# Patient Record
Sex: Female | Born: 1935 | Race: White | Hispanic: No | Marital: Married | Smoking: Never smoker
Health system: Southern US, Community
[De-identification: ages and names within clinical notes are randomized; demographics above are authoritative.]

## PROBLEM LIST (undated history)

## (undated) DIAGNOSIS — K589 Irritable bowel syndrome without diarrhea: Secondary | ICD-10-CM

## (undated) DIAGNOSIS — R569 Unspecified convulsions: Secondary | ICD-10-CM

## (undated) DIAGNOSIS — I4891 Unspecified atrial fibrillation: Secondary | ICD-10-CM

## (undated) DIAGNOSIS — I10 Essential (primary) hypertension: Secondary | ICD-10-CM

## (undated) DIAGNOSIS — D649 Anemia, unspecified: Secondary | ICD-10-CM

## (undated) DIAGNOSIS — K222 Esophageal obstruction: Secondary | ICD-10-CM

## (undated) DIAGNOSIS — Z87440 Personal history of urinary (tract) infections: Secondary | ICD-10-CM

## (undated) DIAGNOSIS — I509 Heart failure, unspecified: Secondary | ICD-10-CM

## (undated) DIAGNOSIS — K5730 Diverticulosis of large intestine without perforation or abscess without bleeding: Secondary | ICD-10-CM

## (undated) DIAGNOSIS — G459 Transient cerebral ischemic attack, unspecified: Secondary | ICD-10-CM

## (undated) DIAGNOSIS — I499 Cardiac arrhythmia, unspecified: Secondary | ICD-10-CM

## (undated) DIAGNOSIS — G40909 Epilepsy, unspecified, not intractable, without status epilepticus: Secondary | ICD-10-CM

## (undated) DIAGNOSIS — F329 Major depressive disorder, single episode, unspecified: Secondary | ICD-10-CM

## (undated) DIAGNOSIS — F3289 Other specified depressive episodes: Secondary | ICD-10-CM

## (undated) DIAGNOSIS — I6350 Cerebral infarction due to unspecified occlusion or stenosis of unspecified cerebral artery: Secondary | ICD-10-CM

## (undated) DIAGNOSIS — K219 Gastro-esophageal reflux disease without esophagitis: Secondary | ICD-10-CM

## (undated) DIAGNOSIS — I639 Cerebral infarction, unspecified: Secondary | ICD-10-CM

## (undated) DIAGNOSIS — E785 Hyperlipidemia, unspecified: Secondary | ICD-10-CM

## (undated) HISTORY — DX: Gastro-esophageal reflux disease without esophagitis: K21.9

## (undated) HISTORY — DX: Personal history of urinary (tract) infections: Z87.440

## (undated) HISTORY — DX: Essential (primary) hypertension: I10

## (undated) HISTORY — DX: Transient cerebral ischemic attack, unspecified: G45.9

## (undated) HISTORY — DX: Cerebral infarction due to unspecified occlusion or stenosis of unspecified cerebral artery: I63.50

## (undated) HISTORY — DX: Epilepsy, unspecified, not intractable, without status epilepticus: G40.909

## (undated) HISTORY — PX: OTHER SURGICAL HISTORY: SHX169

## (undated) HISTORY — DX: Cardiac arrhythmia, unspecified: I49.9

## (undated) HISTORY — DX: Other specified depressive episodes: F32.89

## (undated) HISTORY — PX: HEMORRHOID SURGERY: SHX153

## (undated) HISTORY — DX: Esophageal obstruction: K22.2

## (undated) HISTORY — DX: Major depressive disorder, single episode, unspecified: F32.9

## (undated) HISTORY — DX: Diverticulosis of large intestine without perforation or abscess without bleeding: K57.30

## (undated) HISTORY — DX: Unspecified atrial fibrillation: I48.91

---

## 1999-11-03 ENCOUNTER — Encounter: Payer: Self-pay | Admitting: *Deleted

## 1999-11-03 ENCOUNTER — Encounter: Admission: RE | Admit: 1999-11-03 | Discharge: 1999-11-03 | Payer: Self-pay | Admitting: *Deleted

## 1999-11-14 ENCOUNTER — Encounter: Payer: Self-pay | Admitting: *Deleted

## 1999-11-14 ENCOUNTER — Encounter: Admission: RE | Admit: 1999-11-14 | Discharge: 1999-11-14 | Payer: Self-pay | Admitting: *Deleted

## 1999-12-28 ENCOUNTER — Encounter: Payer: Self-pay | Admitting: Gastroenterology

## 1999-12-28 DIAGNOSIS — K5730 Diverticulosis of large intestine without perforation or abscess without bleeding: Secondary | ICD-10-CM | POA: Insufficient documentation

## 2000-11-11 ENCOUNTER — Encounter: Payer: Self-pay | Admitting: *Deleted

## 2000-11-11 ENCOUNTER — Encounter: Admission: RE | Admit: 2000-11-11 | Discharge: 2000-11-11 | Payer: Self-pay | Admitting: *Deleted

## 2001-11-13 ENCOUNTER — Other Ambulatory Visit: Admission: RE | Admit: 2001-11-13 | Discharge: 2001-11-13 | Payer: Self-pay | Admitting: Obstetrics and Gynecology

## 2001-11-13 ENCOUNTER — Encounter: Payer: Self-pay | Admitting: *Deleted

## 2001-11-13 ENCOUNTER — Encounter: Admission: RE | Admit: 2001-11-13 | Discharge: 2001-11-13 | Payer: Self-pay | Admitting: *Deleted

## 2002-07-23 ENCOUNTER — Encounter: Admission: RE | Admit: 2002-07-23 | Discharge: 2002-07-23 | Payer: Self-pay | Admitting: Internal Medicine

## 2002-07-23 ENCOUNTER — Encounter: Payer: Self-pay | Admitting: Internal Medicine

## 2004-02-10 ENCOUNTER — Inpatient Hospital Stay (HOSPITAL_COMMUNITY): Admission: EM | Admit: 2004-02-10 | Discharge: 2004-02-14 | Payer: Self-pay | Admitting: Emergency Medicine

## 2004-02-10 ENCOUNTER — Encounter (INDEPENDENT_AMBULATORY_CARE_PROVIDER_SITE_OTHER): Payer: Self-pay | Admitting: Cardiology

## 2004-02-24 ENCOUNTER — Encounter: Admission: RE | Admit: 2004-02-24 | Discharge: 2004-04-24 | Payer: Self-pay | Admitting: Neurology

## 2004-04-26 ENCOUNTER — Inpatient Hospital Stay (HOSPITAL_COMMUNITY): Admission: AD | Admit: 2004-04-26 | Discharge: 2004-04-28 | Payer: Self-pay | Admitting: *Deleted

## 2004-05-08 ENCOUNTER — Ambulatory Visit (HOSPITAL_COMMUNITY): Admission: RE | Admit: 2004-05-08 | Discharge: 2004-05-08 | Payer: Self-pay | Admitting: *Deleted

## 2004-06-27 ENCOUNTER — Emergency Department (HOSPITAL_COMMUNITY): Admission: EM | Admit: 2004-06-27 | Discharge: 2004-06-27 | Payer: Self-pay | Admitting: Emergency Medicine

## 2004-07-25 ENCOUNTER — Ambulatory Visit: Payer: Self-pay | Admitting: Internal Medicine

## 2004-10-17 ENCOUNTER — Encounter: Admission: RE | Admit: 2004-10-17 | Discharge: 2004-11-01 | Payer: Self-pay | Admitting: Neurology

## 2004-11-03 ENCOUNTER — Encounter: Admission: RE | Admit: 2004-11-03 | Discharge: 2004-11-03 | Payer: Self-pay | Admitting: Internal Medicine

## 2005-10-10 ENCOUNTER — Ambulatory Visit (HOSPITAL_COMMUNITY): Admission: RE | Admit: 2005-10-10 | Discharge: 2005-10-10 | Payer: Self-pay | Admitting: Neurology

## 2005-12-21 ENCOUNTER — Encounter: Admission: RE | Admit: 2005-12-21 | Discharge: 2005-12-21 | Payer: Self-pay | Admitting: Internal Medicine

## 2006-08-29 ENCOUNTER — Other Ambulatory Visit: Admission: RE | Admit: 2006-08-29 | Discharge: 2006-08-29 | Payer: Self-pay | Admitting: Family Medicine

## 2007-03-11 ENCOUNTER — Encounter: Admission: RE | Admit: 2007-03-11 | Discharge: 2007-03-11 | Payer: Self-pay | Admitting: Neurology

## 2007-03-19 ENCOUNTER — Ambulatory Visit: Payer: Self-pay | Admitting: Gastroenterology

## 2007-03-26 ENCOUNTER — Ambulatory Visit: Payer: Self-pay | Admitting: Gastroenterology

## 2007-03-26 DIAGNOSIS — K222 Esophageal obstruction: Secondary | ICD-10-CM

## 2007-05-07 ENCOUNTER — Ambulatory Visit: Payer: Self-pay | Admitting: Gastroenterology

## 2007-10-23 ENCOUNTER — Encounter: Admission: RE | Admit: 2007-10-23 | Discharge: 2007-10-23 | Payer: Self-pay | Admitting: Family Medicine

## 2007-12-29 DIAGNOSIS — I4891 Unspecified atrial fibrillation: Secondary | ICD-10-CM

## 2007-12-29 DIAGNOSIS — I10 Essential (primary) hypertension: Secondary | ICD-10-CM

## 2007-12-29 DIAGNOSIS — Z8673 Personal history of transient ischemic attack (TIA), and cerebral infarction without residual deficits: Secondary | ICD-10-CM

## 2007-12-29 DIAGNOSIS — R110 Nausea: Secondary | ICD-10-CM

## 2007-12-29 DIAGNOSIS — F329 Major depressive disorder, single episode, unspecified: Secondary | ICD-10-CM

## 2007-12-29 DIAGNOSIS — G459 Transient cerebral ischemic attack, unspecified: Secondary | ICD-10-CM | POA: Insufficient documentation

## 2009-07-26 ENCOUNTER — Encounter: Payer: Self-pay | Admitting: Gastroenterology

## 2009-07-29 ENCOUNTER — Ambulatory Visit: Payer: Self-pay | Admitting: Gastroenterology

## 2009-08-12 ENCOUNTER — Ambulatory Visit: Payer: Self-pay | Admitting: Gastroenterology

## 2009-08-12 DIAGNOSIS — M729 Fibroblastic disorder, unspecified: Secondary | ICD-10-CM | POA: Insufficient documentation

## 2009-08-15 ENCOUNTER — Ambulatory Visit: Payer: Self-pay | Admitting: Gastroenterology

## 2009-08-15 ENCOUNTER — Telehealth: Payer: Self-pay | Admitting: Gastroenterology

## 2009-08-15 ENCOUNTER — Encounter (INDEPENDENT_AMBULATORY_CARE_PROVIDER_SITE_OTHER): Payer: Self-pay | Admitting: *Deleted

## 2009-08-19 ENCOUNTER — Telehealth (INDEPENDENT_AMBULATORY_CARE_PROVIDER_SITE_OTHER): Payer: Self-pay | Admitting: *Deleted

## 2009-08-23 ENCOUNTER — Telehealth: Payer: Self-pay | Admitting: Gastroenterology

## 2009-08-23 ENCOUNTER — Ambulatory Visit: Payer: Self-pay | Admitting: Gastroenterology

## 2009-08-23 ENCOUNTER — Ambulatory Visit (HOSPITAL_COMMUNITY): Admission: RE | Admit: 2009-08-23 | Discharge: 2009-08-23 | Payer: Self-pay | Admitting: Gastroenterology

## 2009-08-24 ENCOUNTER — Telehealth: Payer: Self-pay | Admitting: Gastroenterology

## 2009-09-19 ENCOUNTER — Ambulatory Visit: Payer: Self-pay | Admitting: Gastroenterology

## 2010-10-23 ENCOUNTER — Other Ambulatory Visit: Payer: Self-pay | Admitting: Family Medicine

## 2010-10-23 DIAGNOSIS — Z1239 Encounter for other screening for malignant neoplasm of breast: Secondary | ICD-10-CM

## 2010-10-26 ENCOUNTER — Ambulatory Visit
Admission: RE | Admit: 2010-10-26 | Discharge: 2010-10-26 | Disposition: A | Discharge status: Home / Self Care | Payer: Medicare Other | Source: Ambulatory Visit | Attending: Family Medicine | Admitting: Family Medicine

## 2010-10-26 DIAGNOSIS — Z1239 Encounter for other screening for malignant neoplasm of breast: Secondary | ICD-10-CM

## 2011-02-06 NOTE — Letter (Signed)
March 19, 2007    Pam Drown, M.D.  [address]  [place], [place] [address]   RE:  [name], [name]  MRN:  [id number]  /  DOB:  07/22/1936   Dear Dr. Corliss Blacker:   Upon your kind referral, I had the pleasure of evaluating your patient  and I am pleased to offer my findings.  I saw [name] in the office  today.  Enclosed is a copy of my progress note that details my findings  and recommendations.   Thank you for the opportunity to participate in your patient's care.    Sincerely,      Barbette Hair. Arlyce Dice, MD,FACG  Electronically Signed    RDK/MedQ  DD: 03/19/2007  DT: 03/19/2007  Job #: [id number]

## 2011-02-06 NOTE — Assessment & Plan Note (Signed)
[facility]                         GASTROENTEROLOGY OFFICE NOTE   [name], [name]                        MRN:          [id number]  DATE:05/07/2007                            DOB:          1936-04-21    PROBLEM:  Nausea and dysphagia.   [name] has returned to followup upper endoscopy and esophageal  dilatation.  A distal esophageal stricture was noted and dilated.  Since  that time, her GI symptoms have completely subsided.  She is without  nausea or dysphagia.  All together, she is feeling quite well.   Review of her previous records indicates that she did undergo a  colonoscopy in 2001 where diverticulosis of the left colon was seen.   EXAM:  Pulse 88, blood pressure 120/82, weight 184.   IMPRESSION:  1. Esophageal stricture - asymptomatic following dilatation therapy.  2. Nausea - secondary to #1.  3. History of diverticulosis.   RECOMMENDATIONS:  No further GI workup at this time.     Barbette Hair. Arlyce Dice, MD,FACG  Electronically Signed    RDK/MedQ  DD: 05/07/2007  DT: 05/08/2007  Job #: [id number]   cc:   Pam Drown, M.D.

## 2011-02-06 NOTE — Assessment & Plan Note (Signed)
[facility]                         GASTROENTEROLOGY OFFICE NOTE   ANTHONETTE, LESAGE                        MRN:          [id number]  DATE:03/19/2007                            DOB:          06-23-1936    PROBLEM:  Nausea.   Mrs. Peplinski is a pleasant, 75 year old white female, referred through the  courtesy of Dr. Corliss Blacker for evaluation.  For the last four to six weeks,  she has been complaining of nausea.  The nausea is primarily in the  morning.  It subsides with her mid-day meal.  She does not eat  breakfast.  Symptoms have not improved since starting both Prevacid and  adding omeprazole.  She did change her medicines several months ago to  now include Vytorin.  Previously, she had been taking a lipid-lowering  medicine only.  She denies pyrosis, per se.  She does have some  dysphagia, which, with further questioning, may be a problem with  initiating deglutition.  She denies abdominal pain.  She was last  colonoscoped in 1991.   PAST MEDICAL HISTORY:  Pertinent for hypertension and arrhythmias.  She  has had a CVA and a recent TIA.  She is on Coumadin.  She has a remote  history of a brain tumor.  She is status post hemorrhoidectomy.   FAMILY HISTORY:  Pertinent for a mother who had an MI.   MEDICATIONS INCLUDE:  1. Metoprolol.  2. Vytorin.  3. Coumadin.  4. Keppra.  5. Zoloft.  6. Omeprazole.  7. Prevacid.  8. Baby aspirin.   She is allergic to PENICILLIN.   She neither smokes nor drinks.  She is married and retired.   REVIEW OF SYSTEMS:  Was reviewed and is pertinent for a mild expressive  aphasia.   PHYSICAL EXAMINATION:  Pulse 72, blood pressure 152/94, weight 182.  HEENT: EOMI. PERRLA. Sclerae are anicteric.  Conjunctivae are pink.  NECK:  Supple without thyromegaly, adenopathy or carotid bruits.  CHEST:  Clear to auscultation and percussion without adventitious  sounds.  CARDIAC:  Regular rhythm; normal S1 S2.  There are  no murmurs, gallops  or rubs.  ABDOMEN:  Bowel sounds are normoactive.  Abdomen is soft, non-tender and  non-distended.  There are no abdominal masses, tenderness, splenic  enlargement or hepatomegaly.  EXTREMITIES:  Full range of motion.  No cyanosis, clubbing or edema.  RECTAL:  Deferred.   IMPRESSION:  1. Nausea.  This could be a manifestation of GERD, though she has not      responded to PPI therapy.  Gastroparesis and non-ulcer dyspepsia      are other considerations.  Medications could be causing this      symptom, though there has been no recent change.  2. Dysphagia.  I suspect this is due to neurogenic dysphagia, though a      distal esophageal stricture ought to be ruled out.  3. Arrhythmias - on Coumadin.   RECOMMENDATIONS:  1. Hold Prevacid and continue omeprazole q.h.s.  2. Upper endoscopy.  3. The patient was instructed to eat a small meal upon awakening.  4.  To consider gastric emptying scan, if above measures are      unsuccessful and endoscopy is negative.  5. To consider screening colonoscopy at some point in the future.     Barbette Hair. Arlyce Dice, MD,FACG  Electronically Signed    RDK/MedQ  DD: 03/19/2007  DT: 03/19/2007  Job #: [id number]   cc:   Pam Drown, M.D.

## 2011-02-09 NOTE — Discharge Summary (Signed)
NAME:  [name], [name]                           ACCOUNT NO.:  [id number]   MEDICAL RECORD NO.:  [id number]                   PATIENT TYPE:  INP   LOCATION:  3031                                 FACILITY:  [facility]   PHYSICIAN:  Pramod P. Pearlean Brownie, MD                 DATE OF BIRTH:  [birth year]   DATE OF ADMISSION:  02/10/2004  DATE OF DISCHARGE:  02/14/2004                                 DISCHARGE SUMMARY   DISCHARGE DIAGNOSES:  1. Left middle cerebral artery cardioembolic infarction.  2. Atrial fibrillation.  3. Hypertension.  4. Dyslipidemia.  5. History of brain tumor resection 15 years ago.   DISCHARGE MEDICATIONS:  1. Coumadin 5 mg every day.  2. Clonidine 0.1 mg b.i.d.  3. Zocor 40 mg every day.  4. Hydrochlorothiazide 25 mg every day.  5. Metoprolol 50 mg b.i.d.   STUDIES PERFORMED:  1. CT of the brain on admission shows no acute changes.  Chronic ischemic     changes.  Post surgical changes and encephalomalacia in the left frontal     lobe.  2. MRI of the brain shows acute moderate size left frontal infarct extending     from the left frontal operculum region to the left insula region.     Postoperative changes noted in the left frontal lobe.  Nonspecific white     matter changes secondary to small vessel disease throughout the brain.     Left ICA occlusion.  3. MRA of the brain shows left internal artery occluded with reconstitution     of the left carotid terminus via collaterals.  Atherosclerotic type     changes of branch vessels involving the left MCA as well as the left A1     segment of the left anterior cerebral artery.  4. Carotid Doppler.  Probable left ICA occlusion.  Has a thumping sound with     spiked wave forms off of diastolic flow and low velocities.  Right with     no significant ICA stenosis.  Vertebral arteries were antegrade     bilaterally.  5. Transcranial Doppler completed, results pending at time of discharge.  6. Cerebral angiogram performed  02/11/04 by Dr. Pecolia Ades shows     occluded left ICA proximally with no string  signs.  7. A 2-D echocardiogram shows an ejection fraction of 55-65% with no LV wall     motion abnormalities.  Left ventricular wall thickness moderately     increased.  8. EKG shows atrial fibrillation with rightward axis.   LABORATORY DATA:  CBC normal.  INR 3.1 day of discharge with PT of 24.2.  Differential normal.  Coagulation studies on admission normal.  Chemistries  normal except for glucose at 103.  Cardiac enzymes negative.  Cholesterol  looks good with cholesterol 166, triglycerides 92, HDL 56 and LDL 92.  Homocysteine 11.04.   HISTORY OF PRESENT ILLNESS:  Ms. Lillieann Pavlich is a 75 year old right-handed  white female who has a history of atrial fibrillation and resection of a  left frontoparietal brain tumor.  She was in her usual state of good health  today when suddenly around 8 p.m. she developed speech hesitancy with  inability to get her words out and confusion which was noted by her husband.  She also had right facial droop.  There was no other associated weakness.  EMS was called and the patient was brought to the emergency room, where her  symptoms have remained stable.  A coded stroke was reportedly called per ED  staff, but neurologist was not aware of situation until 10:20 p.m.  She was  not a TPA candidate secondary to time and minimal symptoms.  She was not a  __________ candidate secondary to no extremity weakness.  She was admitted  to the hospital for further stroke evaluation.   HOSPITAL COURSE:  MRI was positive for acute infarct.  There was some  discrepancy between the MRI and the carotid Doppler as far as ICA occlusion,  so an arteriogram was performed which did confirm left ICA occlusion with no  string signs.  Stroke was felt to be cardioembolic secondary to atrial  fibrillation which was present on her EKG.  After the arteriogram, she was  started on IV heparin and  Coumadin.  Once INR was therapeutic, the patient  was discharged home.   The patient was able to tolerate a regular diet, but did poorly with  language and cognition.  Will continue with home or outpatient speech  therapy at time of discharge.  She has no other therapy needs.   Other risk factors identified included dyslipidemia for which patient was on  Zocor and well controlled and hypertension which again is well controlled.  In the hospital, the patient did have some atrial fibrillation with rapid  response.  Dr. Katrinka Blazing was on call for Dr. Fraser Din and was asked to see her.  The patient was on beta blocker b.i.d. at home and was only in the hospital  on every day.  He recommended increasing to her regular dose for better  control of blood pressure and heart rate.  He agreed with other therapies as  at home.  She will follow up at their office as regularly scheduled.   CONDITION ON DISCHARGE:  Patient alert and oriented x3.  No __________.  Nonfluent speech.  Word hesitancy with inability to name.  She has good  repetition and comprehension.  She has no focal extremity weakness.  Her S1  and S2 are present.  Lungs are clear.   DISCHARGE PLAN:  1. Discharge home with spouse.  2. Outpatient speech therapy.  3. Coumadin for secondary stroke prevention.  Will follow up PT/INR level on     Wednesday of this week at Dr.     Jacqualine Code office.  4. Follow up with Dr. Marny Lowenstein on Wednesday also.  5. Follow up with Dr. Delia Heady in two months.  Need to call office for     appointment.      Annie Main, N.P.                         Pramod P. Pearlean Brownie, MD    SB/MEDQ  D:  02/14/2004  T:  02/15/2004  Job:  [id number]   cc:   Sharlet Salina, M.D.  [address]  [place]  [place]  [address]  Fax: [phone]   Meade Maw, M.D.  [address]  [place]  [place] [address]  Fax: [phone]

## 2011-02-09 NOTE — Op Note (Signed)
NAME:  [name], [name]                           ACCOUNT NO.:  [id number]   MEDICAL RECORD NO.:  [id number]                   PATIENT TYPE:  OIB   LOCATION:  2899                                 FACILITY:  [facility]   PHYSICIAN:  Meade Maw, M.D.                 DATE OF BIRTH:  31-Mar-1936   DATE OF PROCEDURE:  05/08/2004  DATE OF DISCHARGE:                                 OPERATIVE REPORT   REFERRING PHYSICIAN:  Sharlet Salina, M.D.   PROCEDURE PERFORMED:  Electrical cardioversion.   INDICATIONS:  Persistent atrial fibrillation while on Rythmol 150 mg t.i.d.   PROCEDURE:  After obtaining written informed consent, the patient was  brought to the outpatient lab.  Anesthesia support was obtained.  The  patient was anesthetized by Dr. Ivin Booty with sodium pentothal 125 mg.  Following appropriate sedation, she was electrically cardioverted to sinus  rhythm using biphasic cardioversion at 150 joules.  She converted  immediately to sinus bradycardia at a rate of 60 beats per minute.  Her INR  has been therapeutic for more than four weeks.   IMPRESSION:  Successful cardioversion to sinus rhythm.  The patient will  continue with Rythmol at 150 mg t.i.d.  She will continue with Coumadin to  maintain an INR of 2-3.  She will discontinue her metoprolol.                                               Meade Maw, M.D.    HP/MEDQ  D:  05/08/2004  T:  05/08/2004  Job:  [id number]   cc:   Sharlet Salina, M.D.  [address]  [place]  [place] [address]  Fax: [phone]

## 2011-02-09 NOTE — H&P (Signed)
NAMEEARLEE, [name]                 ACCOUNT NO.:  [id number]   MEDICAL RECORD NO.:  [id number]          PATIENT TYPE:  INP   LOCATION:  2022                         FACILITY:  [facility]   PHYSICIAN:  Meade Maw, M.D.    DATE OF BIRTH:  Jul 06, 1936   DATE OF ADMISSION:  04/26/2004  DATE OF DISCHARGE:  04/28/2004                                HISTORY & PHYSICAL   PRIMARY CARE PHYSICIAN:  Sharlet Salina, M.D.   CHIEF COMPLAINT:  Persistent atrial fibrillation.   HISTORY OF PRESENT ILLNESS:  [name] is a 75 year old female patient with  history of CVA in May 2005 secondary to left middle cerebral artery infarct.  During that hospitalization, she described that experienced made her very  reluctant to proceed with future hospitalizations and hospital admission.  In regards to any deficits post CVA, she has had some memory and word  finding issues but overall no major focal motor deficits.   She was initially diagnosed with atrial fibrillation in May 2005 at [facility] for  CVA and was started on Coumadin then.  A 48-hour Holter monitor done after  discharge did demonstrated atrial fibrillation with a controlled ventricular  rate in the 70s.  After much discussion between the patient and Dr. Fraser Din,  she is being admitted to the hospital for cardioversion with Rhythmol.   REVIEW OF SYSTEMS:  Noncontributory.   FAMILY MEDICAL HISTORY:  Father had Alzheimer's and hypertension.  Mother  died in her 62s of an MI.   SOCIAL HISTORY:  She is married.  She has a degree in sociology.  She does  not utilize tobacco or alcohol products.   PAST MEDICAL HISTORY:  1.  Persistent atrial fibrillation since May 2005.  2.  CVA secondary to left middle cerebral artery infarct May 2005.  3.  Chronic Coumadin anticoagulation since May 2005.  4.  Hypertension, controlled.  5.  Dyslipidemia.   ALLERGIES:  CODEINE causes rash and edema.   CURRENT MEDICATIONS:  1.  Coumadin 2 mg daily.  2.  Clonidine 0.1  mg b.i.d.  3.  Hydrochlorothiazide 25 mg daily.  4.  Lopressor 100 mg b.i.d.  5.  Zocor 40 mg daily.  6.  Clarinex 5 mg p.r.n. allergy symptoms.  7.  Nitrofurantoin 50 mg q.p.m.  8.  __________ 1 to 2 tablet four times a day p.r.n.  9.  Multivitamins daily.  10. Calcium 600 mg daily.  11. Metamucil p.r.n.  12. Folic acid 400 mcg daily.  13. Vitamin E 400 international units daily.   PHYSICAL EXAMINATION:  GENERAL:  Pleasant, well-developed female in no acute  distress.  VITAL SIGNS:  Temperature 97.2, blood pressure 126/72, heart rate 83 and  regular, respiratory rate 20, O2 saturation 98% on room air.  Height 5 feet  6-1/2 inches, weight 195 pounds.  HEENT:  Head is normocephalic.  Sclerae not injected.  Oral mucosal  membranes pink and moist.  NEUROLOGIC:  Alert and oriented x 3.  She is moving all extremities x 4.  During the examination, the patient did have some difficulty expressing  requests  due to word finding issues.  CHEST:  Bilateral lung sounds are clear to auscultation posteriorly.  Respiratory effort normal on room air as previously described.  HEART:  Regular.  Cardiac monitor demonstrates atrial fibrillation.  There  is no JVD.  Carotids are 2+ bilaterally without bruits.  ABDOMEN:  Soft, nondistended, nontender.  No hepatosplenomegaly, masses, or  bruits.  Bowel sounds are present in all four quadrants.  EXTREMITIES:  Symmetrical in appearance without edema, cyanosis, or  clubbing.  Pulses are palpable radial, femoral, pedal at 2+.   LABORATORY AND X-RAY DATA:  Last INR was prior to admission and greater than  2.0.   Last EKG was done at the office and continued to demonstrate atrial  fibrillation with controlled ventricular rate.   IMPRESSION:  1.  Atrial fibrillation on chronic Coumadin anticoagulation.  2.  Hypertension, controlled.  3.  Dyslipidemia.  4.  History of previous cerebrovascular accident in May 2005.   PLAN:  1.  The patient is to be  admitted to the telemetry unit.  2.  Routine chemical cardioversion orders initiated including baseline BMET,      magnesium, INR, EKG upon arrival, daily PT/INR, and daily EKG.  3.  She will start Rhythmol 150 mg p.o. t.i.d., first dose upon arrival.  4.  Continue home dose of Coumadin, checking INR daily as previous noted.  5.  Will otherwise continue all home medications with recommendation to      decrease Lopressor which had been at 100 mg b.i.d., down to 25 mg b.i.d.       ALE/MEDQ  D:  06/13/2004  T:  06/13/2004  Job:  [id number]   cc:   Sharlet Salina, M.D.  [address]  [place]  [place] [address]  Fax: [phone]

## 2011-02-09 NOTE — H&P (Signed)
NAME:  [name], [name]                           ACCOUNT NO.:  [id number]   MEDICAL RECORD NO.:  [id number]                   PATIENT TYPE:  INP   LOCATION:  3735                                 FACILITY:  [facility]   PHYSICIAN:  Casimiro Needle L. Thad Ranger, M.D.           DATE OF BIRTH:  November 25, 1935   DATE OF ADMISSION:  02/09/2004  DATE OF DISCHARGE:                                HISTORY & PHYSICAL   CHIEF COMPLAINT:  Speech problems.   HISTORY OF PRESENT ILLNESS:  This is the initial [facility]  admission for this 75 year old woman with a past medical history which  includes atrial fibrillation and history of resection of a left  frontoparietal brain tumor.  The patient was in her usual state of fairly  good health today without complaint when suddenly around 8:00 she developed  a speech ____________, inability to get out words, and some confusion which  was noted by her husband.  He also noted a right facial droop.  There was no  other associated weakness.  EMS was alerted and the patient was brought to  the emergency room where her symptoms have remained stable.  She is being  worked up for a presumed acute cerebrovascular event.  No seizure activity  has been noted.  Of note, a code stroke was reportedly called, but I did not  receive the page, and was unaware of the situation until about 10:20 p.m.  She denies any history of previous similar symptoms.  There is no known past  history of stroke events.   PAST MEDICAL HISTORY:  1. Her husband reports that she has had intermittent atrial fibrillation     which are brief, and usually notable by the patient, but not     particularly symptomatic.  2. History of a brain tumor resection about 15 years ago, and had a right     hemiparesis for about six weeks after, but has done quite well from that.  3. History of hypertension.  4. History of high cholesterol.   FAMILY HISTORY:  Father atrial fibrillation.   SOCIAL HISTORY:  She lives  with her husband, is normally independent in her  activities of daily living.  She does not smoke.   ALLERGIES:  PENICILLIN.   MEDICATIONS:  Her husband is uncertain of the entire list, but does include  Zocor, a diuretic, another blood pressure pill, as well as aspirin.  She is  not on Coumadin and never has been.   REVIEW OF SYSTEMS:  Limited by her aphasic state.  She did have a sore  throat yesterday which I understand was better today.  She has had no other  recent particular complaints.   PHYSICAL EXAMINATION:  VITAL SIGNS:  Temperature 97.8, blood pressure  118/111, pulse 98 and regular, respiratory rate is 24.  GENERAL:  She appears alert, in no evident distress.  Laying supine in a  hospital bed.  HEENT:  Head:  Cranium is normocephalic, atraumatic.  Oropharynx is benign.  NECK:  Supple without carotid bruits.  HEART:  Irregularly irregular rhythm without murmur.  CHEST:  Clear to auscultation.  ABDOMEN:  Obese and protuberant with decreased bowel sounds, but soft.  EXTREMITIES:  Trace edema, 2+ pulses.  NEUROLOGIC:  Mental status:  She appears awake and alert.  She has  significantly hesitant speech with an expressive greater than receptive  aphasia.  She is able to follow simple and some complex commands, but has a  lot of difficulty answering questions and naming.  Cranial nerves:  Pupils  equal and reactive.  Extraocular movements are full without nystagmus.  Visual fields are difficult to test, but she does blink to threat from both  sides.  She has some mild right facial droop with minimal weakness.  Tongue  and palate move normally and symmetrically.  Motor:  Normal bulk and tone,  normal strength in all tested extremity muscles.  Sensation:  Intact to  light touch throughout.  There is no sensory neglect to double simultaneous  stimulation.  Reflexes are 2+ and symmetric.  Toes are downgoing.  Coordination:  Finger-to-nose is performed adequately.  Gait is  deferred.   LABORATORY DATA:  CBC:  White count 8.5, hemoglobin 14.3, platelets 263,000.  Chemistries are remarkable for a minimally elevated glucose of 103.  Coags  are normal.  Cardiac enzymes are normal.  EKG reveals atrial fibrillation  with ventricular rate of 98 beats per minute without acute findings.  CT  scan of the head is personally reviewed and demonstrates a large area of  encephalomalacia in the left frontal and frontoparietal areas without acute  finding.   IMPRESSION:  1. Acute onset of non-fluent aphasia.  This most likely represents a     cardioembolic left brain stroke.  2. History of left frontal brain tumor resection without obvious frontal     swelling.  This could be substrate for a possible seizure which is     another possible explanation for the events this evening.  3. Atrial fibrillation.  She currently has had fibrillation in the past, but     has not remained in fibrillation for any length of time as she has     tonight.  4. History of hypertension.  5. History of high cholesterol.   PLAN:  We will treat with intravenous heparin as well as routine stroke  care, including fluids and oxygen.  We will check MRI and MRA of the brain.  We will get carotid Dopplers and echocardiogram.  She will need a speech  therapy consult for both swallowing and language evaluation, as well as  physical therapy and occupational therapy, and rehabilitation consults.  Stroke service to follow.                                                Michael L. Thad Ranger, M.D.    MLR/MEDQ  D:  02/09/2004  T:  02/10/2004  Job:  [id number]   cc:   Sharlet Salina, M.D.  [address]  [place]  [place] [address]  Fax: [phone]   Meade Maw, M.D.  [address]  [place]  [place] [address]  Fax: [phone]

## 2011-02-09 NOTE — Consult Note (Signed)
NAME:  [name], [name]                           ACCOUNT NO.:  [id number]   MEDICAL RECORD NO.:  [id number]                   PATIENT TYPE:  INP   LOCATION:  3031                                 FACILITY:  [facility]   PHYSICIAN:  Lesleigh Noe, M.D.            DATE OF BIRTH:  Jan 01, 1936   DATE OF CONSULTATION:  02/14/2004  DATE OF DISCHARGE:                                   CONSULTATION   CONCLUSION:  1. History of atrial fibrillation. It is difficult for me to tell if this     chronic atrial fibrillation versus paroxysmal atrial fibrillation. The     patient has been in atrial fibrillation since admission. There is     currently poor rate control.  2. Embolic cerebral vascular accident with expressive aphasia.  3. History of hypertension.   PLAN:  1. Increased beta blocker dose for better controlled heart rate and blood     pressure.  2. Continue on medications as noted.   COMMENTS:  The patient is a 75 year old who was admitted on Feb 10, 2004  with stroke. Since admission, ventricular response and atrial fibrillation  has been difficulty to control. In reviewing the chart and speaking with the  patient with some difficulty because of her aphasia, I believe she is  currently on a lower dose of beta blocker than she ordinary takes at home.  We will increase Lopressor to 50 mg p.o. b.i.d. and see if this better  controls her rate. Will have Dr. Fraser Din to follow up.                                               Lesleigh Noe, M.D.    HWS/MEDQ  D:  02/14/2004  T:  02/15/2004  Job:  [id number]   cc:   Sharlet Salina, M.D.  [address]  [place]  [place] [address]  Fax: [phone]   Meade Maw, M.D.  [address]  [place]  [place] [address]  Fax: [phone]

## 2011-03-08 ENCOUNTER — Inpatient Hospital Stay (HOSPITAL_COMMUNITY)
Admission: EM | Admit: 2011-03-08 | Discharge: 2011-03-13 | DRG: 100 | Disposition: A | Discharge status: Rehabilitation Facility | Payer: Medicare Other | Source: Ambulatory Visit | Attending: Family Medicine | Admitting: Family Medicine

## 2011-03-08 ENCOUNTER — Emergency Department (HOSPITAL_COMMUNITY): Payer: Medicare Other

## 2011-03-08 ENCOUNTER — Inpatient Hospital Stay (HOSPITAL_COMMUNITY): Payer: Medicare Other

## 2011-03-08 DIAGNOSIS — I214 Non-ST elevation (NSTEMI) myocardial infarction: Secondary | ICD-10-CM | POA: Diagnosis present

## 2011-03-08 DIAGNOSIS — R197 Diarrhea, unspecified: Secondary | ICD-10-CM | POA: Diagnosis present

## 2011-03-08 DIAGNOSIS — J449 Chronic obstructive pulmonary disease, unspecified: Secondary | ICD-10-CM | POA: Diagnosis present

## 2011-03-08 DIAGNOSIS — I10 Essential (primary) hypertension: Secondary | ICD-10-CM | POA: Diagnosis present

## 2011-03-08 DIAGNOSIS — R4701 Aphasia: Secondary | ICD-10-CM | POA: Diagnosis present

## 2011-03-08 DIAGNOSIS — A498 Other bacterial infections of unspecified site: Secondary | ICD-10-CM | POA: Diagnosis present

## 2011-03-08 DIAGNOSIS — I4891 Unspecified atrial fibrillation: Secondary | ICD-10-CM | POA: Diagnosis present

## 2011-03-08 DIAGNOSIS — J4489 Other specified chronic obstructive pulmonary disease: Secondary | ICD-10-CM | POA: Diagnosis present

## 2011-03-08 DIAGNOSIS — Z8673 Personal history of transient ischemic attack (TIA), and cerebral infarction without residual deficits: Secondary | ICD-10-CM

## 2011-03-08 DIAGNOSIS — E876 Hypokalemia: Secondary | ICD-10-CM | POA: Diagnosis present

## 2011-03-08 DIAGNOSIS — G8389 Other specified paralytic syndromes: Secondary | ICD-10-CM | POA: Diagnosis present

## 2011-03-08 DIAGNOSIS — N390 Urinary tract infection, site not specified: Secondary | ICD-10-CM | POA: Diagnosis present

## 2011-03-08 DIAGNOSIS — G40209 Localization-related (focal) (partial) symptomatic epilepsy and epileptic syndromes with complex partial seizures, not intractable, without status epilepticus: Principal | ICD-10-CM | POA: Diagnosis present

## 2011-03-08 LAB — POCT I-STAT 3, ART BLOOD GAS (G3+)
Acid-base deficit: 3 mmol/L — ABNORMAL HIGH (ref 0.0–2.0)
O2 Saturation: 96 %
pCO2 arterial: 35.7 mmHg (ref 35.0–45.0)
pH, Arterial: 7.388 (ref 7.350–7.400)

## 2011-03-08 LAB — DIFFERENTIAL
Basophils Absolute: 0 10*3/uL (ref 0.0–0.1)
Lymphocytes Relative: 5 % — ABNORMAL LOW (ref 12–46)
Monocytes Relative: 5 % (ref 3–12)

## 2011-03-08 LAB — BASIC METABOLIC PANEL
GFR calc Af Amer: 56 mL/min — ABNORMAL LOW (ref >60)
GFR calc non Af Amer: 46 mL/min — ABNORMAL LOW (ref >60)
Glucose, Bld: 110 mg/dL — ABNORMAL HIGH (ref 70–99)
Potassium: 3.6 mEq/L (ref 3.5–5.1)
Sodium: 144 mEq/L (ref 135–145)

## 2011-03-08 LAB — GLUCOSE, CAPILLARY: Glucose-Capillary: 93 mg/dL (ref 70–99)

## 2011-03-08 LAB — URINALYSIS, ROUTINE W REFLEX MICROSCOPIC
Ketones, ur: 15 mg/dL — AB
Leukocytes, UA: NEGATIVE

## 2011-03-08 LAB — CBC
HCT: 43.6 % (ref 36.0–46.0)
MCH: 32.5 pg (ref 26.0–34.0)
MCHC: 34.9 g/dL (ref 30.0–36.0)
Platelets: 232 10*3/uL (ref 150–400)
RDW: 13.4 % (ref 11.5–15.5)

## 2011-03-08 LAB — URINE MICROSCOPIC-ADD ON

## 2011-03-08 LAB — MRSA PCR SCREENING: MRSA by PCR: NEGATIVE

## 2011-03-08 LAB — PROTIME-INR: INR: 2.11 — ABNORMAL HIGH (ref 0.00–1.49)

## 2011-03-09 ENCOUNTER — Inpatient Hospital Stay (HOSPITAL_COMMUNITY): Payer: Medicare Other

## 2011-03-09 LAB — LIPID PANEL
HDL: 80 mg/dL (ref >39)
LDL Cholesterol: 89 mg/dL (ref 0–99)
Total CHOL/HDL Ratio: 2.3 RATIO
VLDL: 13 mg/dL (ref 0–40)

## 2011-03-09 LAB — URINE MICROSCOPIC-ADD ON

## 2011-03-09 LAB — CARDIAC PANEL(CRET KIN+CKTOT+MB+TROPI)
CK, MB: 6.5 ng/mL (ref 0.3–4.0)
CK, MB: 6.6 ng/mL (ref 0.3–4.0)
Relative Index: 0.5 (ref 0.0–2.5)
Relative Index: 0.5 (ref 0.0–2.5)
Total CK: 1440 U/L — ABNORMAL HIGH (ref 7–177)
Total CK: 1080 U/L — ABNORMAL HIGH (ref 7–177)
Troponin I: 0.5 ng/mL (ref <0.30)
Troponin I: 0.3 ng/mL (ref <0.30)

## 2011-03-09 LAB — COMPREHENSIVE METABOLIC PANEL
Albumin: 2.9 g/dL — ABNORMAL LOW (ref 3.5–5.2)
BUN: 17 mg/dL (ref 6–23)
Creatinine, Ser: 0.95 mg/dL (ref 0.50–1.10)
GFR calc Af Amer: >60 mL/min (ref >60)
Total Bilirubin: 0.4 mg/dL (ref 0.3–1.2)
Total Protein: 6.2 g/dL (ref 6.0–8.3)

## 2011-03-09 LAB — URINALYSIS, ROUTINE W REFLEX MICROSCOPIC
Bilirubin Urine: NEGATIVE
Ketones, ur: 15 mg/dL — AB
Urobilinogen, UA: 0.2 mg/dL (ref 0.0–1.0)

## 2011-03-09 LAB — CBC
Platelets: 196 10*3/uL (ref 150–400)
RDW: 13.5 % (ref 11.5–15.5)
WBC: 13.8 10*3/uL — ABNORMAL HIGH (ref 4.0–10.5)

## 2011-03-09 LAB — PROTIME-INR: INR: 2.52 — ABNORMAL HIGH (ref 0.00–1.49)

## 2011-03-10 LAB — PROTIME-INR
INR: 2.32 — ABNORMAL HIGH (ref 0.00–1.49)
Prothrombin Time: 25.9 seconds — ABNORMAL HIGH (ref 11.6–15.2)

## 2011-03-10 LAB — CBC
HCT: 38.1 % (ref 36.0–46.0)
MCHC: 34.1 g/dL (ref 30.0–36.0)
MCV: 93.8 fL (ref 78.0–100.0)
RDW: 13.9 % (ref 11.5–15.5)

## 2011-03-10 LAB — BASIC METABOLIC PANEL
BUN: 14 mg/dL (ref 6–23)
Calcium: 8.3 mg/dL — ABNORMAL LOW (ref 8.4–10.5)
Creatinine, Ser: 0.91 mg/dL (ref 0.50–1.10)
GFR calc Af Amer: >60 mL/min (ref >60)
GFR calc non Af Amer: >60 mL/min (ref >60)

## 2011-03-10 LAB — URINE CULTURE

## 2011-03-10 NOTE — Procedures (Unsigned)
REFERRING PHYSICIAN:  Ripudeep Rai, MD  HISTORY:  A 75 year old woman status post left frontal stroke and left frontal tumor resection and known history of seizure disorders, comes with status epilepticus.  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  I have also seen this patient myself this morning.  MEDICATIONS:  Keppra, aspirin, Zithromax, Dulcolax, Rocephin, diltiazem, Vimpat, Lopressor, Avelox, Dilaudid, Vicodin, and Ativan.  EEG DURATION:  This is 21.5 minutes of EEG recording.  EEG DESCRIPTION:  This is a routine 18-channel adult EEG recording with one channel devoted to limited EKG recording.  Activation procedure is performed using the photic stimulation and the study is performed in awake and mild drowsy state.  As the EEG opens up, I noticed that the posterior dominant rhythm is asymmetrical.  The dominant rhythm is well formed on the right side and reaches up to 8 Hz frequency at times; however, on the left side, it is high delta to mid theta ranges at the most.  Also, the presence of sharp activity suggesting breach rhythm was recorded on the left frontotemporal convexity.  Again, it is in theta ranges at the most. The possibility of some interspersed focal left frontotemporal spikes could not be completely excluded, however, it is morphed to some degrees due to the presence of superimposed beta activity and EMG artifact.  No electrographic seizures are recorded during the study, no driving was noted with the photic stimulation in posterior leads.  I do see some vaguely formed vertex waves, however, do not see any appreciable deep sleep architecture during the study.  EEG INTERPRETATION:  This is an abnormal study: 1. Due to the presence of asymmetrical posterior dominant rhythm with     well formed on the right side and theta range slowing on the left      side. 2. The study is also abnormal due to the presence of breach rhythm on     the left frontotemporal convexity, which correlates with the     patient's known history of craniotomy years ago. 3. There are possibly focal random spikes without associated sharp     waves on the left temporal convexity, however, the rhythm is mostly     morphed with superimposed beta activity and EMG artifact.  CLINICAL NOTE:  Although, there are no electrographic seizures recorded during this study but the possibility of epileptogenic focus is in the left temporal region and does correlate with the patient's known history of stroke and craniotomy for brain mass in that location.  Continuation of antiepileptic medications in this patient is strongly recommended.  No electrographic or clinical seizures were recorded during this study otherwise.          ______________________________ Levie Heritage, MD    NF:AOZH D:  03/09/2011 13:18:06  T:  03/10/2011 03:28:36  Job #:  [id number]

## 2011-03-11 LAB — CBC
HCT: 40.2 % (ref 36.0–46.0)
Hemoglobin: 13.6 g/dL (ref 12.0–15.0)
RBC: 4.23 MIL/uL (ref 3.87–5.11)

## 2011-03-11 LAB — BASIC METABOLIC PANEL
CO2: 25 mEq/L (ref 19–32)
Chloride: 112 mEq/L (ref 96–112)
GFR calc Af Amer: >60 mL/min (ref >60)
Potassium: 3.6 mEq/L (ref 3.5–5.1)
Sodium: 145 mEq/L (ref 135–145)

## 2011-03-11 LAB — PROTIME-INR
INR: 2.26 — ABNORMAL HIGH (ref 0.00–1.49)
Prothrombin Time: 25.3 seconds — ABNORMAL HIGH (ref 11.6–15.2)

## 2011-03-12 DIAGNOSIS — R5381 Other malaise: Secondary | ICD-10-CM

## 2011-03-12 DIAGNOSIS — R569 Unspecified convulsions: Secondary | ICD-10-CM

## 2011-03-12 DIAGNOSIS — I6330 Cerebral infarction due to thrombosis of unspecified cerebral artery: Secondary | ICD-10-CM

## 2011-03-13 ENCOUNTER — Inpatient Hospital Stay (HOSPITAL_COMMUNITY)
Admission: RE | Admit: 2011-03-13 | Discharge: 2011-03-19 | DRG: 945 | Disposition: A | Discharge status: Home Health | Payer: Medicare Other | Source: Other Acute Inpatient Hospital | Attending: Physical Medicine & Rehabilitation | Admitting: Physical Medicine & Rehabilitation

## 2011-03-13 DIAGNOSIS — A498 Other bacterial infections of unspecified site: Secondary | ICD-10-CM | POA: Diagnosis present

## 2011-03-13 DIAGNOSIS — R569 Unspecified convulsions: Secondary | ICD-10-CM

## 2011-03-13 DIAGNOSIS — R5381 Other malaise: Secondary | ICD-10-CM

## 2011-03-13 DIAGNOSIS — I4891 Unspecified atrial fibrillation: Secondary | ICD-10-CM | POA: Diagnosis present

## 2011-03-13 DIAGNOSIS — G40909 Epilepsy, unspecified, not intractable, without status epilepticus: Secondary | ICD-10-CM | POA: Diagnosis present

## 2011-03-13 DIAGNOSIS — E876 Hypokalemia: Secondary | ICD-10-CM | POA: Diagnosis present

## 2011-03-13 DIAGNOSIS — Z5189 Encounter for other specified aftercare: Principal | ICD-10-CM

## 2011-03-13 DIAGNOSIS — I69920 Aphasia following unspecified cerebrovascular disease: Secondary | ICD-10-CM

## 2011-03-13 DIAGNOSIS — Z7901 Long term (current) use of anticoagulants: Secondary | ICD-10-CM

## 2011-03-13 DIAGNOSIS — N390 Urinary tract infection, site not specified: Secondary | ICD-10-CM | POA: Diagnosis present

## 2011-03-13 DIAGNOSIS — Z7982 Long term (current) use of aspirin: Secondary | ICD-10-CM

## 2011-03-13 DIAGNOSIS — E785 Hyperlipidemia, unspecified: Secondary | ICD-10-CM | POA: Diagnosis present

## 2011-03-13 DIAGNOSIS — R269 Unspecified abnormalities of gait and mobility: Secondary | ICD-10-CM

## 2011-03-13 DIAGNOSIS — I10 Essential (primary) hypertension: Secondary | ICD-10-CM | POA: Diagnosis present

## 2011-03-13 LAB — CLOSTRIDIUM DIFFICILE BY PCR: Toxigenic C. Difficile by PCR: NEGATIVE

## 2011-03-13 LAB — PROTIME-INR
INR: 2 — ABNORMAL HIGH (ref 0.00–1.49)
Prothrombin Time: 23 seconds — ABNORMAL HIGH (ref 11.6–15.2)

## 2011-03-13 LAB — LEVETIRACETAM LEVEL: Levetiracetam Lvl: 8 ug/mL (ref 5.0–30.0)

## 2011-03-14 DIAGNOSIS — R5381 Other malaise: Secondary | ICD-10-CM

## 2011-03-14 DIAGNOSIS — R569 Unspecified convulsions: Secondary | ICD-10-CM

## 2011-03-14 LAB — COMPREHENSIVE METABOLIC PANEL
ALT: 22 U/L (ref 0–35)
AST: 22 U/L (ref 0–37)
Albumin: 3.2 g/dL — ABNORMAL LOW (ref 3.5–5.2)
Calcium: 9.7 mg/dL (ref 8.4–10.5)
Chloride: 102 mEq/L (ref 96–112)
Creatinine, Ser: 0.94 mg/dL (ref 0.50–1.10)
Sodium: 141 mEq/L (ref 135–145)
Total Bilirubin: 0.4 mg/dL (ref 0.3–1.2)

## 2011-03-14 LAB — CBC
MCV: 93.9 fL (ref 78.0–100.0)
Platelets: 244 10*3/uL (ref 150–400)
RDW: 13.2 % (ref 11.5–15.5)
WBC: 7.8 10*3/uL (ref 4.0–10.5)

## 2011-03-14 LAB — PROTIME-INR: INR: 2.09 — ABNORMAL HIGH (ref 0.00–1.49)

## 2011-03-14 LAB — DIFFERENTIAL
Basophils Relative: 1 % (ref 0–1)
Eosinophils Absolute: 0.2 10*3/uL (ref 0.0–0.7)
Eosinophils Relative: 2 % (ref 0–5)
Lymphs Abs: 2.4 10*3/uL (ref 0.7–4.0)
Neutrophils Relative %: 58 % (ref 43–77)

## 2011-03-15 LAB — URINALYSIS, ROUTINE W REFLEX MICROSCOPIC
Protein, ur: NEGATIVE mg/dL
Specific Gravity, Urine: 1.024 (ref 1.005–1.030)
Urobilinogen, UA: 0.2 mg/dL (ref 0.0–1.0)

## 2011-03-15 LAB — CULTURE, BLOOD (ROUTINE X 2)
Culture  Setup Time: 201206150114
Culture: NO GROWTH

## 2011-03-15 LAB — PROTIME-INR: Prothrombin Time: 26.4 seconds — ABNORMAL HIGH (ref 11.6–15.2)

## 2011-03-15 LAB — URINE MICROSCOPIC-ADD ON

## 2011-03-16 DIAGNOSIS — R569 Unspecified convulsions: Secondary | ICD-10-CM

## 2011-03-16 DIAGNOSIS — R5381 Other malaise: Secondary | ICD-10-CM

## 2011-03-16 LAB — PROTIME-INR
INR: 3.19 — ABNORMAL HIGH (ref 0.00–1.49)
Prothrombin Time: 33.2 seconds — ABNORMAL HIGH (ref 11.6–15.2)

## 2011-03-16 NOTE — H&P (Signed)
[name], [name] NO.:  [id number]  MEDICAL RECORD NO.:  [id number]  LOCATION:  [facility]                         FACILITY:  [facility]  PHYSICIAN:  [name] Ranger, MD       DATE OF BIRTH:  [birth year]  DATE OF ADMISSION:  03/08/2011 DATE OF DISCHARGE:                             HISTORY & PHYSICAL   PRIMARY CARE PHYSICIAN:  [name] Drown, MD with [facility].  CHIEF COMPLAINT:  Intractable seizures today.  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  History was obtained from [name] patient's daughter present in [name] room as [name] patient has aphasia and was unable to provide any history.  Per [name] patient'sdaughter who does not live at [name] patient's home and received history from her father ([name] patient's [name]) who is at home.  Per [name] patient's daughter, stated that [name] patient's [name] found her waking up and sitting sideways in [name] bed around 10:30 a.m.  [name] patient was attempting to get out of [name] bed when she started having jerky movements and tonic-clonic seizures with eyes rolling back.  [name] patient's [name] called 911 and EMS reported [name] patient had approximately 10-15 minutes of seizure and about 12 seizures that required 12.5 mg worse that to terminate.  [name] patient arrived in [name] ED, initially unresponsive and however shortly after awakening, [name] patient was seen to have right facial droop with right hemiparesis.  [name] patient had a brain tumor resection in [name] left frontoparietal brain tumor 15 years ago and CVA in 2008 per [name] patient's family.  [name] patient follows [name] neurologist; however, [name] patient's daughter was unable to tell me [name] name  of [name] neurologist.  [name] patient's seizures are treated by Keppra and she is aphasic at baseline.  However, is able to say short words at baseline. [name] patient's family also reported that since Tuesday, [name] patient has not been doing well and per [name] patient, she had not been sleeping well. [name] patient had biopsy done 3 days ago on Tuesday for [name] basal cell carcinoma on her forehead.  At [name] time of my encounter, [name] patient is alert and awake; however, aphasic.  Able to comprehend most of [name] questions, but unable to reply.  She is able to nod her head appropriately for [name] questions.  REVIEW OF SYSTEMS:  Unable to obtain from [name] patient due to her mental status.  PAST MEDICAL HISTORY: 1. Hypertension. 2. History of basal cell carcinoma. 3. History of cerebrovascular accident. 4. History of left frontoparietal brain tumor status post resection. 5. History of atrial fibrillation on Coumadin.  SOCIAL HISTORY:  [name] patient denies any smoking, alcohol, or any drug use.  She currently lives at home with her [name].  ALLERGIES:  CODEINE.  MEDICATIONS:  Prior to admission; 1. Aspirin 81 mg daily. 2. Atorvastatin 20 mg daily. 3. Keppra 250 mg once a day. 4. Lisinopril  40 mg once a day. 5. Metoprolol 50 mg b.i.d. 6. Coumadin 2 mg daily.  PHYSICAL EXAMINATION:  VITAL SIGNS:  Blood pressure 169/82, pulse rate 116 to 120, irregularly irregular atrial fibrillation, respiratory rate 26, temp 98.0.  GENERAL:  [name] patient is alert and awake; however, aphasic.  Unable to provide history.  Does not appear to be in acute distress, able to comprehend questions, but unable to respond. HEENT:  Anicteric and clear conjunctivae.  Pupils reactive to light and accommodation.  EOMI. NECK:  Supple.  No lymphadenopathy. CARDIOVASCULAR SYSTEM:  S1 and S2 clear.  Irregularly irregular rhythm. CHEST:  Fairly clear to auscultation bilaterally. ABDOMEN:  Soft, nontender, and nondistended.  No bowel  sounds. EXTREMITIES:  No cyanosis, clubbing, or edema noted. NEUROLOGIC:  Aphasia noted.  Neuro exam did show right facial drooping. Strength 0/5 in [name] right upper and lower extremities  and 4/5 in [name] left upper and lower extremities bilaterally.  DIAGNOSTIC DATA:  ABG showed pH of 7.3 and pCO2 of 35.7.  CBC showed white count of 15.0, hemoglobin 15.2, hematocrit 43.6, platelets 232, neutrophils 90%, and INR 2.11.  UA negative for any UTI.  Did show large blood with 15 ketones.  BMET showed sodium 144, potassium 3.6, BUN 20, creatinine 1.1, calcium 9.4, and troponin 0.31.  EKG did show rapid AFib with ST-segment depression.  IMPRESSION AND PLAN:  [name] is a 75 year old female with history of prior brain tumor resection from [name] left frontoparietal region, history of seizures or CVA, who presents with intractable seizures today. 1. Seizures, unclear what precipitated her seizures; although, family     did report that [name] patient's [name] has end-stage chronic     obstructive pulmonary disease, recently diagnosed, which has also     been quite stressful on [name] family.  [name] patient required 12.5 mg     Versed to breakthrough seizures.  Neurology consultation is     obtained and per recommendations after my discussion with Dr.     Hoy [name], we will start [name] patient on IV Keppra.  Obtain Keppra     level and [name] patient will need EEG.  [name] patient will be seen by     neurologist formally tonight. 2. Right hemiparesis.  Question whether it is cerebrovascular     accident.  However, CT of [name] head does not show any acute     cerebrovascular accident versus Todd paralysis from [name] seizures.     We will obtain stat MRI of [name] brain to rule out any acute     cerebrovascular accident.  [name] patient has a prior history of     cerebrovascular accident and was on aspirin and Coumadin.  Per [name]     patient's family, [name] aphasia has been worse since this episode     today.  We will await for  formal Neurology recommendations tonight. 3. Atrial fibrillation with rapid ventricular rate.  [name] patient has a     history of atrial fibrillation and is on anticoagulation with     Coumadin.  For now given her hypertension and with rapid     ventricular rate, I will start her on IV Cardizem.  I have     consulted with Cardiology and [name] patient will be seen by Dr. [name].  We will defer to Cardiology to start heparin drip.  [name]     patient is currently n.p.o. until formal swallow evaluation.  She  failed RN swallow test in [name] emergency room.  INR is therapeutic     currently. 4. Non-ST elevation myocardial infarction, likely secondary to     intractable seizures and demand ischemia.  We will continue to     monitor [name] patient in Step-Down.  We will obtain cardiac enzymes.     We will obtain 2-D echocardiogram for further workup.  [name] patient     will be continued on aspirin.  We will focus on heart rate control.     I will defer Coumadin versus heparin drip at [name] present time to     Cardiology. 5. Prior history of cerebrovascular accident.  We will continue     aspirin PR for now and we will follow further recommendations from     Neurology. 6. Code status.  I discussed in detail with [name] patient's family and     [name] patient herself and [name] patient is full code.     [name] Ranger, MD     RR/MEDQ  D:  03/08/2011  T:  03/08/2011  Job:  [id number]  cc:   [name], M.D. Lyn Records, M.D.  Electronically Signed by Andres Labrum Tee Richeson  on 03/16/2011 05:10:15 PM

## 2011-03-17 LAB — URINE CULTURE: Culture  Setup Time: 201206211757

## 2011-03-18 LAB — PROTIME-INR: INR: 2.89 — ABNORMAL HIGH (ref 0.00–1.49)

## 2011-03-19 LAB — BASIC METABOLIC PANEL
CO2: 28 mEq/L (ref 19–32)
Calcium: 9.1 mg/dL (ref 8.4–10.5)
Sodium: 141 mEq/L (ref 135–145)

## 2011-03-19 LAB — CBC
HCT: 37.6 % (ref 36.0–46.0)
Hemoglobin: 12.8 g/dL (ref 12.0–15.0)
RBC: 4.04 MIL/uL (ref 3.87–5.11)
RDW: 12.9 % (ref 11.5–15.5)
WBC: 6.1 10*3/uL (ref 4.0–10.5)

## 2011-03-19 LAB — PROTIME-INR: INR: 2.94 — ABNORMAL HIGH (ref 0.00–1.49)

## 2011-03-29 NOTE — Consult Note (Signed)
[name], [name] NO.:  [id number]  MEDICAL RECORD NO.:  [id number]  LOCATION:  3109                         FACILITY:  [facility]  PHYSICIAN:  Lyn Records, M.D.   DATE OF BIRTH:  06/05/1936  DATE OF CONSULTATION:  03/08/2011 DATE OF DISCHARGE:                                CONSULTATION   REASON FOR CONSULTATION:  Abnormal cardiac markers and rapid ventricular response to atrial fibrillation.  CONCLUSIONS: 1. Status epilepticus on presentation, greater than 12 seizures.     a.     Resolved after IV Versed. 2. Prior CVA. 3. History of brain tumor, status post resection. 4. Chronic atrial fibrillation, currently with increased rate     secondary to demand from status epilepticus and beta-blocker     withdrawal. 5. Elevated troponin of uncertain significance, but given the     appearance of one EKG at 11:43 a.m. today, we need to rule out ACS     versus demand-related ischemia. 6. Chronic Coumadin anticoagulation because of atrial fibrillation.  RECOMMENDATIONS: 1. Obtain serial markers to rule out significant myocardial injury. 2. I would recommend a 2-D echo to look for regional wall motion     abnormality. 3. IV diltiazem as the patient is currently unable to swallow.  This     will help to control her rate and it will also be nice to add beta-     blocker therapy when possible given the possibility, somewhat     remote that this is ACS. 4. I will likely recommend that the patient have a myocardial     perfusion study at some point, perhaps even as an outpatient     depending upon her cardiac marker trend to rule out underlying     obstructive coronary artery disease. 5. I would hold heparin until INR is less than 2.0. 6. I would add aspirin when possible.  COMMENTS:  The patient is lying in room #16 in the [facility] Emergency Department.  She is awake, but unable to speak due to aphasia from previous neurological injury.  She does not appear to  be in any distress.  Her family is at the bedside.  She denies chest pain.  She is having some abdominal discomfort.  She denies shortness of breath. There is no history of coronary artery disease.  She presented with status epilepticus having greater than 10 seizures.  Troponin-I when checked subsequent to the initial determination was elevated at 0.31. Her other laboratory data is unremarkable with the exception of an INR of 2.11 due to Coumadin therapy.  Significant prior medical history other than as listed above is basal cell carcinoma of the skin.  SOCIAL HISTORY:  The patient denies alcohol and tobacco use.  ALLERGIES:  CODEINE.  OUTPATIENT MEDICATIONS:  According to the medication reconciliation is: 1. Lisinopril 40 mg per day. 2. Metoprolol 50 mg b.i.d. 3. Coumadin 2 mg taken as directed. 4. Keppra 250 mg daily. 5. Atorvastatin 20 mg daily. 6. Aspirin 81 mg per day.  PHYSICAL EXAMINATION:  GENERAL:  The patient is lying flat in bed. VITAL SIGNS:  Blood pressure 138/70 and heart rate 110.  The  monitor demonstrates atrial fib with variable rate ranging between 95-115 beats per minute.  No significant ventricular ectopy is noted. NECK:  No jugular vein distention of note.  Faint left carotid bruits heard. LUNGS:  Clear. CARDIAC:  Rapid irregular rhythm, but no significant murmur or gallop. ABDOMEN:  Mildly tender, but soft.  Bowel sounds are diminished. EXTREMITIES:  No edema.  Pulses are symmetrical at the radial and posterior tibial bilaterally. HEENT:  Resolving periorbital ecchymosis. NEUROLOGICAL:  Difficulty with speaking.  Decreased motion on the left side of her body.  The chest x-ray reveals left lower lobe atelectasis versus infiltrate. CT of the head is nonacute.  EKGs done around 11:43 revealed atrial fib with rapid rate and ST depression consistent with ischemia.  Other laboratory data is unremarkable.  DISCUSSION:  The patient is comfortable at this time  and has no hemodynamic compromise.  Serial markers needed to be done to rule out non-ST-elevation myocardial infarction versus demand ischemia from status epilepticus and increased heart rate response.  An echocardiogram will be helpful to rule out regional wall motion abnormality.  I would not heparinize the patient until the patient's INR is 2 or less. Continue aspirin.  Resume beta-blocker therapy when possible, i.e., when the patient is able to swallow.  If there is difficulty with rate control on diltiazem, we can add bolus doses of metoprolol.  I will follow with you.     Lyn Records, M.D.     HWS/MEDQ  D:  03/08/2011  T:  03/09/2011  Job:  [id number]  cc:   Pam Drown, M.D. Marlan Palau, M.D.  Electronically Signed by [name] M.D. on 03/29/2011 01:38:37 PM

## 2011-04-11 NOTE — Consult Note (Signed)
[name], [name]:  [id number]  MEDICAL RECORD NO.:  [id number]  LOCATION:  3109                         FACILITY:  [facility]  PHYSICIAN:  Marlan Palau, M.D.  DATE OF BIRTH:  20-May-1936  DATE OF CONSULTATION:  03/08/2011 DATE OF DISCHARGE:                                CONSULTATION   HISTORY OF PRESENT ILLNESS:  [name] is a 75 year old white female born on 12/28/1935, with a history of a left frontal stroke but also has had a tumor resection from the left frontal region as well with a large cystic deficit and encephalomalacia in that area.  This patient has a history of seizures and was last seen through our office in December 2011.  This patient had cut back on the Keppra taking only 250 mg at nighttime and in December 2011 had noted some sensations of an impending seizure, but was unwilling to increase the dose of the Keppra. The patient has done fairly well until today.  The patient was found by her husband to have a seizure and had a multitude of seizures after EMS arrival requiring Versed.  The patient may have had at least 13 seizures today.  The patient had bitten her tongue and has an increased right hemiparesis and aphasia following the seizure.  CT scan of the head shows only chronic changes.  No acute changes were noted.  Neurology was asked to see this patient for further evaluation.  PAST MEDICAL HISTORY:  Significant for: 1. History of left middle cerebral artery distribution stroke. 2. Left frontal tumor resection. 3. History of seizures. 4. Atrial fibrillation. 5. History of chronic aphasia. 6. Obesity. 7. Left internal carotid artery occlusion. 8. Depression. 9. Hypertension. 10.Anxiety disorder. 11.Dyslipidemia.  The patient does not smoke or drink.  ALLERGIES:  Has an allergy to PENICILLIN and to CLARITIN D.  MEDICATIONS: 1. Coumadin 1 mg tabs on Mondays and Fridays, 2 mg on Sundays,     Tuesdays, Thursdays,  Saturdays. 2. Aspirin 81 mg daily. 3. Zestril 40 mg daily. 4. Vitamin D 1000 units 2 tablets daily. 5. Toprol-XL 100 mg 1 tablet daily. 6. Lexapro 5 mg daily. 7. Keppra 250 mg at night.  SOCIAL HISTORY:  This patient is retired, lives with husband.  The patient has two children.  The patient has a college education.  Again does not smoke or drink.  FAMILY MEDICAL HISTORY:  Father died at age 66 with heart disease. Mother passed away at age 67 with heart disease.  REVIEW OF SYSTEMS:  Notable for some slight headache.  The patient is aphasic.  Does have increased weakness on the right side.  The patient has soreness of the tongue and swelling of the tongue.  The patient denies any abdominal pain or chest pain.  PHYSICAL EXAMINATION:  VITAL SIGNS:  Blood pressure is 151/89, heart rate 116, respiratory rate 20, temperature afebrile. GENERAL:  This patient is a fairly, well-developed white female who is alert, cooperative at the time of examination. HEENT:  Head reveals bandage over the right frontal area where skin tumor was removed.  Pupils equal, round, and reactive to light. NECK:  Supple.  No carotid bruits noted. RESPIRATORY:  Clear. CARDIOVASCULAR:  Occasionally irregular heart rhythm.  No obvious murmurs or rubs noted. EXTREMITIES:  Without significant edema. NEUROLOGIC:  Cranial nerves as above.  Facial symmetry is not present. There is a depression in the right nasolabial fold.  There is bruising around the right eye.  The patient has full extraocular movements.  Is not picking up on visual cues as well on the right.  Homonymous visual fields, she has on the left.  The patient is essentially mute but appears to understand language fairly well.  Is able to follow commands. The patient has had a very definite hemiparesis on the right arm, right leg but some increased tone on the right side.  The patient is not able to lift the right arm or right leg against gravity.  The  patient has good strength on the left side.  The patient will follow commands with finger-nose-finger with left arm and heel-to-shin with the left leg. Deep tendon reflexes are relatively symmetric.  Toes neutral bilaterally.  The patient notes a fairly symmetric pinprick and vibratory sensation throughout.  The patient could not be ambulated.  Laboratory values notable for white count of 15, hemoglobin of 15.2, hematocrit of 43.6, MCV of 93.2, platelets of 232.  INR is 2.11.  Sodium is 144, potassium 3.6, chloride of 104, CO2 of 24, glucose of 110, BUN of 20, creatinine 1.16, calcium 9.4.  Troponin-I 0.31.  Urinalysis reveals specific gravity of 1.020, pH 5.0, 15 mg per deciliter ketones, large amount of blood, 3-6 red cells, 0-2 white cells.  MRI scan of the brain is pending.  IMPRESSION: 1. History of seizure disorder with recent recurrence. 2. Left frontal stroke and tumor resection. 3. Anxiety disorder.  This patient has been on minimal doses of Keppra prior to coming in. The patient indicates that she has not been able to tolerate higher doses.  The patient will be placed on Vimpat during this hospitalization and converted over from Keppra to Vimpat.  The patient has a very prominent right hemiparesis that likely represents a Todd paralysis, but MRI study will help to confirm whether or not a new stroke is occurred. The patient may take several days to resolve Todd paralysis and may require physical and occupational therapy and possibly rehab.  We will follow the patient's clinical course while in-house.  Patient will be on seizure precautions.  Thank you very much.     Marlan Palau, M.D.     CKW/MEDQ  D:  03/08/2011  T:  03/09/2011  Job:  [id number]  cc:   [facility]  Electronically Signed by Thana Farr M.D. on 04/11/2011 08:22:32 AM

## 2011-04-11 NOTE — Discharge Summary (Signed)
NAMESPECIAL, RANES                 ACCOUNT NO.:  [id number]  MEDICAL RECORD NO.:  [id number]  LOCATION:  3742                         FACILITY:  [facility]  PHYSICIAN:  Mauro Kaufmann, MD         DATE OF BIRTH:  12-Aug-1936  DATE OF ADMISSION:  03/08/2011 DATE OF DISCHARGE:  03/13/2011                              DISCHARGE SUMMARY   ADMISSION DIAGNOSES: 1. Status epilepticus. 2. Right hemiparesis. 3. Atrial fibrillation. 4. Non-ST-elevation myocardial infarction. 5. History of cerebrovascular accident.  DISCHARGE DIAGNOSES: 1. Left temporal epilepsy. 2. Todd paralysis. 3. Atrial fibrillation. 4. Urinary tract infection. 5. Hypertension. 6. Diarrhea as needed.  TESTS PERFORMED DURING THE HOSPITAL STAY: 1. CT head with contrast on March 08, 2011, showed stable postop     changes, left frontal lobe atrophy, and chronic microvascular     ischemia.  No acute intracranial abnormality. 2. Chest x-ray on March 08, 2011, showed left lower lobe atelectasis     versus infiltrate. 3. MRI of the brain on March 09, 2011, showed no acute stroke or     hemorrhage. 4. Chest x-ray on March 09, 2011, again showed stable cardiomegaly     without pulmonary edema, and suboptimal inspiration accounts for     mild bilateral lower lobe atelectasis.  No acute cardiopulmonary     disease otherwise.  PERTINENT LABORATORY DATA:  The patient had a urine culture, which showed E-coli sensitive to Rocephin.  Blood cultures x2 negative.  The patient had elevated troponin for which Cardiology consultation was obtained.  CONSULTATIONS OBTAINED DURING HOSPICE STAY: 1. Cardiology consultation. 2. Neurology consultation.  The patient had EEG on March 09, 2011, which showed there was a possibility epileptogenic focus in the left elbow region, which correlate with the patient's known history of stroke and could not afford brain mass in the location.  Continuation of antiepileptic medication in this patient is  strongly recommended.  No retrographic or clinical seizures were recorded during the study otherwise.  BRIEF HISTORY AND PHYSICAL:  This is a 75 year old female who was brought to the hospital after she had seizures.  The patient has a history of resection of left frontoparietal brain tumor, history of seizure disorder, and history of prior CVA.  The patient had jerky movements with tonic-clonic seizure with eyes rolling back.  The patient at approximately 10-15 minutes of seizures and had about 12 seizures and required 12.5 mg of Versed to terminate.  Cardiology consultation was obtained, also the patient had elevated cardiac enzymes, so she was also seen by Cardiology.  BRIEF HOSPITAL COURSE: 1. Seizures.  The patient was started on IV Keppra as per Neurology     consultation.  The patient also was started on the Vimpat, which     was discontinue.  At this time, the patient will be continued on     Keppra 500 mg p.o. b.i.d. 2. Todd paralysis.  The patient was seen by Neurology and at this     time, the patient will need to go to the inpatient rehab for the     strengthening.  The Todd paralysis is improving. 3. E coli UTI.  The patient was  found to have UTI with E coli     sensitive to Rocephin and she has received Rocephin in the hospital     per, today is the last dose. 4. Hypertension.  The patient's blood pressure was uncontrolled, so     she was started on Norvasc and she will be continuing on that. 5. AFib.  The patient's heart rate is controlled.  She will be     continued on the warfarin.  The patient was seen by Cardiology here     in the hospital stay.  The elevated cardiac enzymes were thought to     be secondary to seizures. 6. Diarrhea.  The patient had bout of diarrhea in the hospital, which     could be secondary to the IV Rocephin.  Today is the last dose of     Rocephin and I am also going to obtain a PCR for C. diff and if it     is positive, then we will treat  her appropriately.  The patient can     be then started on Flagyl if the C. diff is positive.  At this     time, the patient will be sent to the inpatient rehab.  DISCHARGE MEDICATIONS: 1. Amlodipine 10 mg p.o. daily. 2. Aspirin 81 mg p.o. daily. 3. Keppra 500 p.o. b.i.d. 4. Lisinopril 40 mg p.o. daily. 5. Metoprolol 50 mg p.o. b.i.d. 6. Miconazole topical application b.i.d. 7. Crestor 10 mg p.o. daily. 8. Warfarin as per pharmacy. 9. Tylenol 650 mg p.o. q.4 h. p.r.n. 10.Dulcolax 10 mg per rectum daily p.r.n. 11.Norco 2 tabs p.o. q.4 h. p.r.n. 12.Dilaudid 0.5 mg IV q.4 h. p.r.n.  After the patient is discharged from the rehab.  She will follow up with her primary care physician, Dr. Gweneth Dimitri with the [facility].     Mauro Kaufmann, MD     GL/MEDQ  D:  03/13/2011  T:  03/13/2011  Job:  [id number]  Electronically Signed by Sibyl Parr LAMA  on 04/11/2011 07:34:52 PM

## 2011-04-15 NOTE — H&P (Signed)
[name], HANDS NO.:  [id number]  MEDICAL RECORD NO.:  [id number]  LOCATION:  4029                         FACILITY:  [facility]  PHYSICIAN:  Ranelle Oyster, M.D.DATE OF BIRTH:  [birth year]  DATE OF ADMISSION:  03/13/2011 DATE OF DISCHARGE:                             HISTORY & PHYSICAL   PRIMARY CARE PHYSICIAN:  Pam Drown, MD  CHIEF COMPLAINT:  Weakness and confusion.  PRESENT ILLNESS:  This is a 75 year old white female with AFib on chronic Coumadin with left MCA infarcts in 2005 with residual aphasia. She had left frontoparietal brain tumor resection in 1997 and subsequent seizure disorder.  She was admitted on March 08, 2011 with multiple seizures reported by her husband.  MRI was negative for acute changes. EEG was negative.  The patient was on Keppra and this is increased from 250 b.i.d. with 500 mg b.i.d. per Dr. Anne Hahn.  She had no more seizures since admission.  She remains on Coumadin for atrial fibrillation per Dr. [name].  She is on IV Rocephin for E. coli UTI which ends on March 13, 2011.  She has had some diarrhea which was felt to be secondary to her antibiotic.  Imodium was added with improvement.  Rehab was asked to consult this patient.  I felt that she could benefit from an inpatient rehab stay.  REVIEW OF SYSTEMS:  Notable for language finding problems and fair appetite.  She has had some fatigue.  Full 12-point review is in the written H and P.  PAST MEDICAL HISTORY: 1. Positive for chronic AFib. 2. Left MCA infarct in 2005 with resulting aphasia. 3. Left frontoparietal brain tumor with resection in 1997. 4. Seizure disorder. 5. Hyperlipidemia. 6. Hypertension. 7. Recent basal cell lesion removed from forehead 1 week ago prior to     this admission.  HABITS:  The patient does not drink or smoke.  FAMILY HISTORY:  Positive for CAD.  SOCIAL HISTORY:  The patient lives with her husband who can assist as needed.  She  lives in one-level house with one step to enter.  ALLERGIES:  PENICILLIN.  HOME MEDICATIONS:  Aspirin, Keppra, lisinopril, metoprolol, Coumadin, and atorvastatin.  LABORATORY DATA:  Hemoglobin 13.6, white count 9.5, and platelets 206. Sodium 145, potassium 3.6, BUN 16, and creatinine 0.8.  PHYSICAL EXAMINATION:  VITAL SIGNS:  The patient blood pressure is 155/87, pulse 92, respiratory rate 18, and temperature 98. GENERAL:  The patient is generally pleasant, alert to the number of the day but needed cues for month, day of the week, year, etc. HEENT:  Pupils equal, round, and reactive to light.  Ear, nose, and throat exam notable for fair dentition.  Pink moist mucosa. NECK:  Supple without JVD or lymphadenopathy. SKIN:  Notable for forehead incision which had sutures removed and was well-approximated with some residual scabbing noted. CHEST:  Clear to auscultation bilaterally without wheezes, rales, or rhonchi. HEART:  Regular rate and rhythm without murmurs, rubs, or gallops. ABDOMEN:  Soft and nontender.  Bowel sounds are positive. NEUROLOGIC:  Cranial nerves II-XII notable for perhaps mild right central VII.  Speech is generally clear.  Reflexes are 1+.  Sensation  is grossly intact.  Judgment was impaired although language was impaired as well.  She had some definite word-finding deficits but could identify simple objects.  Mood was generally pleasant and the patient was cooperative.  Strength was 4/5 both upper extremities, proximal and distal.  She had 2-2+/5 strength at the hips, 3/5 strength at both knees and ankle dorsiflexion, plantar flexion 3+-4/5 with left side being a bit more stronger than the right.  POST ADMISSION PHYSICIAN EVALUATION: 1. Functional deficits secondary to seizure disorder in the setting of     left MCA infarct as well as left frontoparietal brain tumor thus     worsening the patient's balance, gait, and cognition. 2. The patient is admitted to  receive collaborative interdisciplinary     care between the physiatrist, rehab nursing staff, and therapy     team. 3. The patient's level of medical complexity and substantial therapy     needs in context of that medical necessity cannot be provided at a     lesser intensity of care.  Medical complexity includes the     patient's bleeding risk on Coumadin, AFib, history of E. coli UTI,     and malignant hypertension. 4. The patient has experienced substantial functional loss from her     baseline.  Upon functional assessment at the time of preadmission     screening, the patient was min assist bed mobility, mod assist     transfer, mod assist gait 80 feet rolling walker, min to mod assist     ADLs.  Previously, she was independent needing some supervision for     safety apparently.  Judging by the patient's diagnosis, physical     exam, and functional history, she has potential for functional     progress which will result in measurable gains while in inpatient     rehab.  These gains will be of substantial and practical use upon     discharge to home in facilitating mobility and self-care. 5. The physiatrist will provide 24-hour management of medical needs as     well as oversight of the therapy plan/treatment and provide     guidance as appropriate regarding interaction of the two.  Medical     problem list and plan are below. 6. A 24-hour rehab nursing team will assist in the management of the     patient's skin care needs as well as bowel and bladder function,     safety awareness, nutrition, integration of therapy concepts, and     techniques. 7. PT will assess and treat for lower extremity strength, range of     motion, cognitive perceptual training, visual perceptual training,     adaptive techniques, equipment with goals modified independent to     supervision. 8. OT will assess and treat for upper extremity use ADLs, adaptive     techniques, equipment, functional mobility,  safety, cognitive     perceptual, and visual perceptual training with goals modified     independent to supervision. 9. Speech Language Pathology will assess and treat for cognitive and     language needs with goals modified independent to min assist. 10.Case Management and social worker will assess and treat for     psychosocial issues and discharge planning. 11.Team conference will be held weekly to assess progress towards     goals and to determine barriers at discharge. 12.The patient has demonstrated sufficient medical stability and     exercise capacity to tolerate at least 3  hours of therapy per day     at least 5 days a week. 13.Estimated length stay is approximately 1-2 weeks.  Prognosis is     good.  MEDICAL PROBLEM LIST AND PLAN: 1. Anticoagulation with Coumadin.  INR today is 2.0.  Following     platelets and blood counts, there have been no active signs of     bleeding on exam. 2. Malignant hypertension:  The patient is on Norvasc, lisinopril, and     Lopressor.  We need to monitor blood pressures lability with     dietary intake as well as increased activity.  May need further     adjustment going forward. 3. Atrial fibrillation:  Heart rates controlled.  Heart was regular on     exam.  Continue beta-blocker for rate control. 4. Escherichia coli urinary tract infection.  Rocephin is complete     today.  We will follow up urinalysis and culture on admission. 5. Hyperlipidemia:  Crestor. 6. Recent basal cell carcinoma.  She is status post excision.     Continue wound care at the operative site. 7. Seizure disorder:  Continue Keppra 500 mg q.12 h.  The patient     seems to be handling medication fairly well at this point.  There     is some fatigue noted and this will bear watching going forward.     No active signs of seizing or recent seizures noted per the chart.     Ranelle Oyster, M.D.     ZTS/MEDQ  D:  03/13/2011  T:  03/14/2011  Job:  [id number]  cc:    Pam Drown, M.D.  Electronically Signed by Faith Rogue M.D. on 04/15/2011 08:04:00 PM

## 2011-04-15 NOTE — Discharge Summary (Signed)
[name], SHUEY NO.:  [id number]  MEDICAL RECORD NO.:  [id number]  LOCATION:  4029                         FACILITY:  [facility]  PHYSICIAN:  Ranelle Oyster, M.D.DATE OF BIRTH:  [birth year]  DATE OF ADMISSION:  03/13/2011 DATE OF DISCHARGE:  03/19/2011                              DISCHARGE SUMMARY   DISCHARGE DIAGNOSES: 1. Seizure disorder/history of left middle cerebral artery     infarction/history of left frontal parietal brain tumor. 2. Malignant hypertension, atrial fibrillation, seizure disorder,     Escherichia coli urinary tract infection resolved, hyperlipidemia,     recent basal cell lesion removed from forehead.  This is a 75 year old white female with history of atrial fibrillation on chronic Coumadin, history of left middle cerebral artery infarction in 2005 with residual aphasia and left frontoparietal brain tumor with resection in 1997 with seizure disorder.  Admitted June 14 with multiple seizures reported by her husband.  MRI negative for acute event.  EEG negative.  The patient on Keppra 250 mg twice daily increased to 500 mg twice daily per Neurology Services.  No more seizure activity noted, remained on chronic Coumadin therapy.  E. coli urinary tract infection with Rocephin ending June 19.  The patient was moderate assist to ambulate.  She was admitted for comprehensive rehab program.  PAST MEDICAL HISTORY:  See discharge diagnoses.  No alcohol or tobacco.  SOCIAL HISTORY:  Lives with husband and assistance as needed, 1 level home, 1 step to entry.  Functional history prior to admission was independent by report. Functional status upon admission to Rehab Services was minimal assist bed mobility, moderate assist transfers, moderate assist ambulate 8 feet with the rolling walker, minimum to moderate assist activities of daily living.  MEDICATIONS PRIOR TO ADMISSION: 1. Aspirin 81 mg daily. 2. Keppra 250 mg twice daily. 3.  Lisinopril 40 mg daily. 4. Metoprolol 50 mg twice daily. 5. Coumadin daily. 6. Atorvastatin 20 mg daily.  PHYSICAL EXAMINATION:  VITAL SIGNS:  Blood pressure 155/87, pulse 92, temperature 98, respirations 18. GENERAL:  This was an alert female in no acute distress with expressive aphasia, oriented to place, needs cues for date.  She was apraxic.  Deep tendon reflexes hyperactive.  She withdraws to deep stimuli.  Recent basal cell lesion from forehead removed with site healing nicely. LUNGS:  Clear to auscultation. CARDIAC:  Rate-controlled. ABDOMEN:  Soft, nontender.  Good bowel sounds.  REHABILITATION HOSPITAL COURSE:  The patient was admitted to Inpatient Rehab Services with therapies initiated on a 3-hour daily basis consisting of physical therapy, occupational therapy, and 24-hour rehabilitation nursing.  The following issues were addressed during the patient's rehabilitation stay.  Pertaining to Ms. Shelden's seizure disorder, history of left middle cerebral artery infarction as well as frontal parietal brain tumor.  The patient doing nicely.  Her Keppra had been increased to 500 mg twice daily per Neurology Services due to recent seizure.  No more seizure activity was noted.  She was tolerating her therapy program quite nicely.  She remained on chronic Coumadin therapy for atrial fibrillation followed by Dr. Gweneth Dimitri with latest INR of 2.29.  Her blood pressures with no orthostatic changes  noted.  She continued on Norvasc, lisinopril, and Lopressor.  She had completed a course of Rocephin for E. coli urinary tract infection, however, followup urinalysis did show yeast.  She was asymptomatic.  She continued on Crestor for hyperlipidemia.  The patient received weekly collaborative interdisciplinary team conferences to discuss estimated length of stay, family teaching, any barriers to discharge.  She was ambulating, supervision for safety without an assistive device.  She  was able to navigate stairs with close supervision.  She was able to complete her activities of daily living with minimal set up.  She did have expressive aphasia from a history of cerebrovascular accident in 2005 with mild apraxia.  Overall she continued to improve nicely.  She was felt to be at her baseline per her husband.  Plan is to be discharged home June 25 with ongoing therapies dictated as per [employer].  Latest labs showed an INR of 2.89.  Sodium 142, potassium 3.5, hemoglobin 15.1, hematocrit 43.2, platelet 244,000, BUN 25, creatinine 0.9.  DISCHARGE MEDICATIONS AT THE TIME OF DICTATION: 1. Coumadin 2 mg daily with dose to be established at the time of     discharge with an INR goal of 2.0-3.0. 2. Norvasc 10 mg daily. 3. Aspirin 81 mg daily. 4. Keppra 500 mg twice daily. 5. Lisinopril 40 mg daily. 6. Lopressor 50 mg twice daily. 7. Crestor 10 mg at bedtime. 8. Monistat cream 2% applied to perianal area twice daily x3 more     days. 9. Florastor 500 mg twice daily x2 weeks and stop. 10.Tylenol as needed.  DIET:  Regular.  SPECIAL INSTRUCTIONS:  Home health nurse to check INR weekly with results.  Dr. Gweneth Dimitri, [phone].  She should follow up with Dr. Faith Rogue at the Outpatient Rehab Service office as needed.  Dr. Lesia Sago, Neurology Services, call for appointment.  Home health therapies had been arranged as per [employer].     Mariam Dollar, P.A.   ______________________________ Ranelle Oyster, M.D.    DA/MEDQ  D:  03/19/2011  T:  03/19/2011  Job:  [id number]  cc:   Ranelle Oyster, M.D. Pam Drown, M.D. Marlan Palau, M.D. Lyn Records, M.D.  Electronically Signed by Mariam Dollar P.A. on 03/20/2011 10:37:57 AM Electronically Signed by Faith Rogue M.D. on 04/15/2011 08:03:37 PM

## 2011-06-07 ENCOUNTER — Telehealth: Payer: Self-pay | Admitting: Gastroenterology

## 2011-06-08 NOTE — Telephone Encounter (Signed)
Pt scheduled  

## 2011-06-12 ENCOUNTER — Ambulatory Visit (INDEPENDENT_AMBULATORY_CARE_PROVIDER_SITE_OTHER): Payer: Medicare Other | Admitting: Nurse Practitioner

## 2011-06-12 ENCOUNTER — Encounter: Payer: Self-pay | Admitting: Nurse Practitioner

## 2011-06-12 DIAGNOSIS — F419 Anxiety disorder, unspecified: Secondary | ICD-10-CM

## 2011-06-12 DIAGNOSIS — F411 Generalized anxiety disorder: Secondary | ICD-10-CM

## 2011-06-12 DIAGNOSIS — Z7901 Long term (current) use of anticoagulants: Secondary | ICD-10-CM

## 2011-06-12 DIAGNOSIS — R110 Nausea: Secondary | ICD-10-CM

## 2011-06-12 DIAGNOSIS — K219 Gastro-esophageal reflux disease without esophagitis: Secondary | ICD-10-CM

## 2011-06-12 NOTE — Assessment & Plan Note (Addendum)
Recurrent nausea, in 2010 nausea was felt to be secondary to medications after an unremarkable EGD. Suspect recurrent nausea is again secondary to medications, this time possibly Macrodantin. Anxiety could also be playing a part as husband has noticed that Ativan at bedtime helps with nausea the following day. Recommend holding Macrodantin for 7-10 days with close attention to nausea. Husband will call our office after that with condition update. She only has one Ativan left. If holding Macrodantin fails to make a difference will rechallenge with short course of Ativan. If nausea persists beyond that then she will likely imaging studies and / or repeat upper endoscopy.

## 2011-06-12 NOTE — Progress Notes (Signed)
[name] [id number] 02/28/1936   HISTORY OR PRESENT ILLNESS : Patient is a 75 year old female on chronic Coumadin for atrial fibrillation. She has a history of CVA and seizures. Patient was last seen in late 2010 by Dr. Arlyce Dice for nausea, which was ultimately felt to be secondary to medications. Patient is back with recurrent nausea and complains of heartburn as well. She has aphasia so her husband helps provide the history. Patient was hospitalized in June with seizures. Her seizure meds were subsequently changed. She also had changes in cholesterol and HTN medication. She was diagnosed and treated for a UTI. Upon hospital discharge patient developed nausea which she thought was from the new seizure medication. Despite being  switched back to Keppra her nausea persisted. According to the husband, a follow up urinalysis showed persistent infection requiring another course of antibiotics ? Macrodantin. Following that patient was referred her to urology who suggested maintenance Macrodantin which she is currently taking. Her nausea is worse in am prior to eating. She doesn't vomit. No weight loss.  Labs in August at [facility] office reveal negative H.Pylori, normal LFTs and normal  renal function.  Patient was started on daily PPI and despite doubling dose two weeks ago, she hasn't had any improvement in nausea or heartburn.  She sleeps with head of bed elevated and doesn't go to bed with food in stomach.   Patient's husband describes panic attacks. Patient wakes up during night shaking and calling out for help. She is anxious about possibility of having another seizure which usually occur in early morning hours. She was given a prescription for Ativan and husband has noticed that after taking one at bedtime patient doesn't have nausea the next day.  Current Medications, Allergies, Past Medical History, Past Surgical History, Family History and Social History were reviewed in [employer] record.   PHYSICAL EXAMINATION : General: Well developed  female in no acute distress Head: Normocephalic and atraumatic Eyes:  sclerae anicteric,conjunctive pink. Ears: Normal auditory acuity Mouth: No deformity or lesions Neck: Supple, no masses.  Lungs: Clear throughout to auscultation Heart: Regular rate and rhythm; no murmurs heard Abdomen: Soft, nondistended, nontender. No masses or hepatomegaly noted. Normal bowel sounds Rectal: Not done Musculoskeletal: Symmetrical with no gross deformities  Skin: No lesions on visible extremities Extremities: No edema or deformities noted Neurological: Alert, has difficultly answering some questions / expressing thoughts Cervical Nodes:  No significant cervical adenopathy Psychological:  Alert and cooperative. Normal mood and affect  ASSESSMENT AND PLAN :

## 2011-06-12 NOTE — Patient Instructions (Addendum)
Hold the Macrodantin for 5-7 days.  Call our office if you have any further problems or questions.

## 2011-06-14 ENCOUNTER — Telehealth: Payer: Self-pay | Admitting: Nurse Practitioner

## 2011-06-14 DIAGNOSIS — F419 Anxiety disorder, unspecified: Secondary | ICD-10-CM | POA: Insufficient documentation

## 2011-06-14 DIAGNOSIS — Z7901 Long term (current) use of anticoagulants: Secondary | ICD-10-CM | POA: Insufficient documentation

## 2011-06-14 NOTE — Assessment & Plan Note (Addendum)
Heartburn and nausea since late June. EGD in 2008 and 2010 for nausea negative for endoscopic evidence of GERD. A 24 hour pH study in 2010 did not demonstrate significant GERD. Heartburn and nausea persists despite daily PPI which was recently increased to twice daily. For now she will continue twice daily PPI, anti-reflux measures. Refer to "nausea" for additional information.

## 2011-06-14 NOTE — Assessment & Plan Note (Signed)
Patient has significant anxiety surrounding the possibility she may have another seizure. According to the husband, patient wakes up in the middle of the night shaking and calling for help. Will defer to PCP.

## 2011-06-14 NOTE — Telephone Encounter (Signed)
Spoke with patient's husband and patient is having some constipation and he is wondering what to try for this. He will try Miralax daily. He will call for further problems or questions.

## 2011-06-14 NOTE — Assessment & Plan Note (Signed)
Chronic Coumadin for history of atrial fibrillation.

## 2011-06-15 NOTE — Progress Notes (Signed)
I agree, #1 most common side effect of macrodantin is Nausea (according to Epocrates).  Lets see how she feels off that medicine for a while.

## 2011-06-18 ENCOUNTER — Telehealth: Payer: Self-pay

## 2011-06-18 NOTE — Telephone Encounter (Signed)
Pts husband calling to update [place] on his wife's condition. States she saw Willette Cluster NP last week and that his wife was having problems with Nausea. She was instructed to hold her Macrodantin and to let us know if her nausea was better. Pts husband states that the nausea has not been any better since stopping the Macrodantin. Please advise.

## 2011-06-19 MED ORDER — LORAZEPAM 1 MG PO TABS: 1.0000 mg | ORAL_TABLET | Freq: Two times a day (BID) | ORAL | Status: DC

## 2011-06-19 NOTE — Telephone Encounter (Signed)
Spoke with pts husband and he is aware. Script sent to pharmacy. 

## 2011-06-19 NOTE — Telephone Encounter (Signed)
Decrease protonix to qd Add ativan 1mg  bid C/b 5 days

## 2011-06-19 NOTE — Telephone Encounter (Signed)
Dr. Arlyce Dice please see the note below, Gunnar Fusi is not in the office this week. Please advise.

## 2011-07-10 ENCOUNTER — Ambulatory Visit: Payer: Medicare Other | Admitting: Gastroenterology

## 2011-07-17 ENCOUNTER — Emergency Department (HOSPITAL_COMMUNITY)
Admission: EM | Admit: 2011-07-17 | Discharge: 2011-07-17 | Disposition: A | Discharge status: Home / Self Care | Payer: Medicare Other | Attending: Emergency Medicine | Admitting: Emergency Medicine

## 2011-07-17 DIAGNOSIS — E876 Hypokalemia: Secondary | ICD-10-CM | POA: Insufficient documentation

## 2011-07-17 DIAGNOSIS — Z79899 Other long term (current) drug therapy: Secondary | ICD-10-CM | POA: Insufficient documentation

## 2011-07-17 DIAGNOSIS — G40909 Epilepsy, unspecified, not intractable, without status epilepticus: Secondary | ICD-10-CM | POA: Insufficient documentation

## 2011-07-17 DIAGNOSIS — Z8673 Personal history of transient ischemic attack (TIA), and cerebral infarction without residual deficits: Secondary | ICD-10-CM | POA: Insufficient documentation

## 2011-07-17 DIAGNOSIS — Z7901 Long term (current) use of anticoagulants: Secondary | ICD-10-CM | POA: Insufficient documentation

## 2011-07-17 DIAGNOSIS — I10 Essential (primary) hypertension: Secondary | ICD-10-CM | POA: Insufficient documentation

## 2011-07-17 LAB — BASIC METABOLIC PANEL
BUN: 23 mg/dL (ref 6–23)
CO2: 22 mEq/L (ref 19–32)
Chloride: 105 mEq/L (ref 96–112)
Creatinine, Ser: 1.02 mg/dL (ref 0.50–1.10)
Glucose, Bld: 127 mg/dL — ABNORMAL HIGH (ref 70–99)

## 2011-07-17 LAB — CBC
HCT: 34.8 % — ABNORMAL LOW (ref 36.0–46.0)
Hemoglobin: 12.3 g/dL (ref 12.0–15.0)
MCH: 33 pg (ref 26.0–34.0)
MCHC: 35.3 g/dL (ref 30.0–36.0)
MCV: 93.3 fL (ref 78.0–100.0)
RDW: 13.4 % (ref 11.5–15.5)

## 2011-07-17 LAB — URINE MICROSCOPIC-ADD ON

## 2011-07-17 LAB — URINALYSIS, ROUTINE W REFLEX MICROSCOPIC
Glucose, UA: NEGATIVE mg/dL
Ketones, ur: NEGATIVE mg/dL
Leukocytes, UA: NEGATIVE
Protein, ur: NEGATIVE mg/dL
pH: 6.5 (ref 5.0–8.0)

## 2011-07-21 ENCOUNTER — Telehealth: Payer: Self-pay | Admitting: Gastroenterology

## 2011-07-21 MED ORDER — PANTOPRAZOLE SODIUM 40 MG PO TBEC: 40.0000 mg | DELAYED_RELEASE_TABLET | Freq: Two times a day (BID) | ORAL | Status: DC

## 2011-07-21 NOTE — Telephone Encounter (Signed)
Her husband called, she had siezures last week and her GERD symptoms have worsened.  She has only been taking ppi once daily and will increase to twice daily now.  I called in a new script for bid protonix with 5 refills.

## 2011-07-23 ENCOUNTER — Telehealth: Payer: Self-pay | Admitting: Gastroenterology

## 2011-07-23 NOTE — Telephone Encounter (Signed)
Pts husband states that his wife is having a terrible time with acid reflux. States she cannot sleep at night due to the reflux. Requesting to be seen sooner that first available. Pt scheduled to see Amy Esterwood PA 07/25/11@3pm . Pt aware of appt date and time.

## 2011-07-25 ENCOUNTER — Ambulatory Visit: Payer: Medicare Other | Admitting: Physician Assistant

## 2011-08-01 ENCOUNTER — Ambulatory Visit (INDEPENDENT_AMBULATORY_CARE_PROVIDER_SITE_OTHER): Payer: Medicare Other | Admitting: Gastroenterology

## 2011-08-01 ENCOUNTER — Encounter: Payer: Self-pay | Admitting: Gastroenterology

## 2011-08-01 DIAGNOSIS — R110 Nausea: Secondary | ICD-10-CM

## 2011-08-01 DIAGNOSIS — K219 Gastro-esophageal reflux disease without esophagitis: Secondary | ICD-10-CM

## 2011-08-01 MED ORDER — PANTOPRAZOLE SODIUM 40 MG PO TBEC: 40.0000 mg | DELAYED_RELEASE_TABLET | Freq: Every day | ORAL | Status: DC

## 2011-08-01 NOTE — Assessment & Plan Note (Signed)
Symptoms were very likely related to Keppra. She likely has underlying reflux which is exacerbated by this medication.  Current medications #1 the patient will continue with Tegretol and will avoid Keppra #2 DC Carafate and reduce Protonix to once daily

## 2011-08-01 NOTE — Patient Instructions (Signed)
Discontinue Carafate Reduce your Protonix to once a day Follow up as needed

## 2011-08-01 NOTE — Progress Notes (Signed)
[name] has returned for evaluation of nausea. Since weaning and finally discontinuing Keppra her nausea and pyrosis have entirely subsided. She remains on Carafate and twice a day protonic. Her husband clearly notes that nausea became very severe when she was started on Keppra and worsened when her dose was increased.

## 2011-08-01 NOTE — Assessment & Plan Note (Signed)
Plan to decrease Protonix to once a day and DC Carafate

## 2011-08-06 ENCOUNTER — Encounter: Payer: Self-pay | Admitting: Gastroenterology

## 2011-08-06 ENCOUNTER — Telehealth: Payer: Self-pay | Admitting: Gastroenterology

## 2011-08-06 NOTE — Telephone Encounter (Signed)
Spoke with patient's husband and he states he thinks the nausea and burning are both back. When he asked her where it is hurting, she points to the area just below her bra.

## 2011-08-06 NOTE — Telephone Encounter (Signed)
Spoke with patient's husband and gave him Dr. Marzetta Board recommendation. He will call back in a few days and let us know if this helps.

## 2011-08-06 NOTE — Telephone Encounter (Signed)
Try increasing the Protonix to twice a day and call back in today

## 2011-08-06 NOTE — Telephone Encounter (Signed)
Patient's husband calling to report that patient's acid reflux returned over the weekend. She is off the Keppra. He states at her OV last week, he felt like the Keppra had caused the reflux but he does not believe that now since the reflux is back. After the visit, she did stop the Carafate and decreased the Protonix to daily as recommended at the visit. Please, advise.

## 2011-08-06 NOTE — Telephone Encounter (Signed)
Her main complaint was nausea which was attributed to Keppra.  As the nausea returned? With symptoms is he referring to what he describes acid reflux?

## 2011-08-09 ENCOUNTER — Telehealth: Payer: Self-pay | Admitting: Gastroenterology

## 2011-08-09 ENCOUNTER — Other Ambulatory Visit: Payer: Self-pay | Admitting: Gastroenterology

## 2011-08-09 NOTE — Telephone Encounter (Signed)
Pts husband states that she saw her PCP today and is still having problems with reflux and nausea. She had tried increasing her protonix to bid to see if that helped but it is not helping. Husband wonders if she needs an upper GI done or something like that. Dr. Arlyce Dice please advise.

## 2011-08-09 NOTE — Telephone Encounter (Signed)
Pt scheduled for [facility] @ [facility] 08/17/11 arrival time 12:45pm for a 1pm appt. Pt aware of appt date and time. Pt to be NPO after midnight. Spoke with pts husband and he is aware.

## 2011-08-09 NOTE — Telephone Encounter (Signed)
Ms. get a gastric emptying scan

## 2011-08-17 ENCOUNTER — Ambulatory Visit (HOSPITAL_COMMUNITY)
Admission: RE | Admit: 2011-08-17 | Discharge: 2011-08-17 | Disposition: A | Discharge status: Home / Self Care | Payer: Medicare Other | Source: Ambulatory Visit | Attending: Gastroenterology | Admitting: Gastroenterology

## 2011-08-17 DIAGNOSIS — R109 Unspecified abdominal pain: Secondary | ICD-10-CM | POA: Insufficient documentation

## 2011-08-17 MED ORDER — TECHNETIUM TC 99M SULFUR COLLOID
2.0000 | Freq: Once | INTRAVENOUS | Status: AC | PRN
  Administered 2011-08-17: 2 via ORAL

## 2011-08-20 ENCOUNTER — Inpatient Hospital Stay (HOSPITAL_BASED_OUTPATIENT_CLINIC_OR_DEPARTMENT_OTHER)
Admission: EM | Admit: 2011-08-20 | Discharge: 2011-09-04 | DRG: 371 | Disposition: A | Discharge status: Skilled Nursing Facility | Payer: Medicare Other | Source: Ambulatory Visit | Attending: Internal Medicine | Admitting: Internal Medicine

## 2011-08-20 ENCOUNTER — Emergency Department (INDEPENDENT_AMBULATORY_CARE_PROVIDER_SITE_OTHER): Payer: Medicare Other

## 2011-08-20 ENCOUNTER — Other Ambulatory Visit: Payer: Self-pay

## 2011-08-20 ENCOUNTER — Encounter (HOSPITAL_BASED_OUTPATIENT_CLINIC_OR_DEPARTMENT_OTHER): Payer: Self-pay | Admitting: Family Medicine

## 2011-08-20 DIAGNOSIS — Z7901 Long term (current) use of anticoagulants: Secondary | ICD-10-CM

## 2011-08-20 DIAGNOSIS — R269 Unspecified abnormalities of gait and mobility: Secondary | ICD-10-CM

## 2011-08-20 DIAGNOSIS — D126 Benign neoplasm of colon, unspecified: Secondary | ICD-10-CM | POA: Diagnosis present

## 2011-08-20 DIAGNOSIS — I6350 Cerebral infarction due to unspecified occlusion or stenosis of unspecified cerebral artery: Secondary | ICD-10-CM

## 2011-08-20 DIAGNOSIS — I69920 Aphasia following unspecified cerebrovascular disease: Secondary | ICD-10-CM

## 2011-08-20 DIAGNOSIS — K222 Esophageal obstruction: Secondary | ICD-10-CM

## 2011-08-20 DIAGNOSIS — K529 Noninfective gastroenteritis and colitis, unspecified: Secondary | ICD-10-CM

## 2011-08-20 DIAGNOSIS — G40909 Epilepsy, unspecified, not intractable, without status epilepticus: Secondary | ICD-10-CM | POA: Diagnosis present

## 2011-08-20 DIAGNOSIS — I509 Heart failure, unspecified: Secondary | ICD-10-CM | POA: Diagnosis not present

## 2011-08-20 DIAGNOSIS — I5031 Acute diastolic (congestive) heart failure: Secondary | ICD-10-CM

## 2011-08-20 DIAGNOSIS — B370 Candidal stomatitis: Secondary | ICD-10-CM | POA: Diagnosis not present

## 2011-08-20 DIAGNOSIS — I10 Essential (primary) hypertension: Secondary | ICD-10-CM

## 2011-08-20 DIAGNOSIS — G9340 Encephalopathy, unspecified: Secondary | ICD-10-CM | POA: Diagnosis not present

## 2011-08-20 DIAGNOSIS — J449 Chronic obstructive pulmonary disease, unspecified: Secondary | ICD-10-CM

## 2011-08-20 DIAGNOSIS — A0472 Enterocolitis due to Clostridium difficile, not specified as recurrent: Principal | ICD-10-CM

## 2011-08-20 DIAGNOSIS — J9600 Acute respiratory failure, unspecified whether with hypoxia or hypercapnia: Secondary | ICD-10-CM

## 2011-08-20 DIAGNOSIS — R5383 Other fatigue: Secondary | ICD-10-CM | POA: Diagnosis not present

## 2011-08-20 DIAGNOSIS — R110 Nausea: Secondary | ICD-10-CM

## 2011-08-20 DIAGNOSIS — G459 Transient cerebral ischemic attack, unspecified: Secondary | ICD-10-CM

## 2011-08-20 DIAGNOSIS — F3289 Other specified depressive episodes: Secondary | ICD-10-CM

## 2011-08-20 DIAGNOSIS — R197 Diarrhea, unspecified: Secondary | ICD-10-CM

## 2011-08-20 DIAGNOSIS — W19XXXA Unspecified fall, initial encounter: Secondary | ICD-10-CM

## 2011-08-20 DIAGNOSIS — J690 Pneumonitis due to inhalation of food and vomit: Secondary | ICD-10-CM | POA: Diagnosis not present

## 2011-08-20 DIAGNOSIS — R509 Fever, unspecified: Secondary | ICD-10-CM

## 2011-08-20 DIAGNOSIS — I4891 Unspecified atrial fibrillation: Secondary | ICD-10-CM

## 2011-08-20 DIAGNOSIS — F0390 Unspecified dementia without behavioral disturbance: Secondary | ICD-10-CM | POA: Diagnosis present

## 2011-08-20 DIAGNOSIS — R739 Hyperglycemia, unspecified: Secondary | ICD-10-CM

## 2011-08-20 DIAGNOSIS — R471 Dysarthria and anarthria: Secondary | ICD-10-CM | POA: Diagnosis not present

## 2011-08-20 DIAGNOSIS — A09 Infectious gastroenteritis and colitis, unspecified: Secondary | ICD-10-CM

## 2011-08-20 DIAGNOSIS — K5730 Diverticulosis of large intestine without perforation or abscess without bleeding: Secondary | ICD-10-CM

## 2011-08-20 DIAGNOSIS — M729 Fibroblastic disorder, unspecified: Secondary | ICD-10-CM

## 2011-08-20 DIAGNOSIS — E876 Hypokalemia: Secondary | ICD-10-CM

## 2011-08-20 DIAGNOSIS — K219 Gastro-esophageal reflux disease without esophagitis: Secondary | ICD-10-CM

## 2011-08-20 DIAGNOSIS — F419 Anxiety disorder, unspecified: Secondary | ICD-10-CM

## 2011-08-20 DIAGNOSIS — R1310 Dysphagia, unspecified: Secondary | ICD-10-CM | POA: Diagnosis not present

## 2011-08-20 DIAGNOSIS — R4182 Altered mental status, unspecified: Secondary | ICD-10-CM

## 2011-08-20 DIAGNOSIS — F329 Major depressive disorder, single episode, unspecified: Secondary | ICD-10-CM

## 2011-08-20 DIAGNOSIS — J189 Pneumonia, unspecified organism: Secondary | ICD-10-CM

## 2011-08-20 DIAGNOSIS — G319 Degenerative disease of nervous system, unspecified: Secondary | ICD-10-CM

## 2011-08-20 DIAGNOSIS — K589 Irritable bowel syndrome without diarrhea: Secondary | ICD-10-CM | POA: Diagnosis present

## 2011-08-20 HISTORY — DX: Irritable bowel syndrome, unspecified: K58.9

## 2011-08-20 LAB — COMPREHENSIVE METABOLIC PANEL
ALT: 10 U/L (ref 0–35)
AST: 17 U/L (ref 0–37)
Albumin: 3.3 g/dL — ABNORMAL LOW (ref 3.5–5.2)
Alkaline Phosphatase: 55 U/L (ref 39–117)
BUN: 10 mg/dL (ref 6–23)
CO2: 27 mEq/L (ref 19–32)
Calcium: 9.2 mg/dL (ref 8.4–10.5)
Chloride: 100 mEq/L (ref 96–112)
Creatinine, Ser: 0.9 mg/dL (ref 0.50–1.10)
GFR calc Af Amer: 72 mL/min — ABNORMAL LOW (ref >90)
GFR calc non Af Amer: 62 mL/min — ABNORMAL LOW (ref >90)
Glucose, Bld: 146 mg/dL — ABNORMAL HIGH (ref 70–99)
Potassium: 2.9 mEq/L — ABNORMAL LOW (ref 3.5–5.1)
Sodium: 139 mEq/L (ref 135–145)
Total Bilirubin: 0.7 mg/dL (ref 0.3–1.2)
Total Protein: 6.8 g/dL (ref 6.0–8.3)

## 2011-08-20 LAB — DIFFERENTIAL
Basophils Absolute: 0 10*3/uL (ref 0.0–0.1)
Basophils Relative: 0 % (ref 0–1)
Eosinophils Absolute: 0 10*3/uL (ref 0.0–0.7)
Eosinophils Relative: 0 % (ref 0–5)
Lymphocytes Relative: 3 % — ABNORMAL LOW (ref 12–46)
Lymphs Abs: 0.5 10*3/uL — ABNORMAL LOW (ref 0.7–4.0)
Monocytes Absolute: 1.6 10*3/uL — ABNORMAL HIGH (ref 0.1–1.0)
Monocytes Relative: 10 % (ref 3–12)
Neutro Abs: 14.1 10*3/uL — ABNORMAL HIGH (ref 1.7–7.7)
Neutrophils Relative %: 87 % — ABNORMAL HIGH (ref 43–77)

## 2011-08-20 LAB — CBC
HCT: 37.4 % (ref 36.0–46.0)
Hemoglobin: 12.6 g/dL (ref 12.0–15.0)
MCH: 32.2 pg (ref 26.0–34.0)
MCHC: 33.7 g/dL (ref 30.0–36.0)
MCV: 95.7 fL (ref 78.0–100.0)
Platelets: 186 10*3/uL (ref 150–400)
RBC: 3.91 MIL/uL (ref 3.87–5.11)
RDW: 13.4 % (ref 11.5–15.5)
WBC: 16.2 10*3/uL — ABNORMAL HIGH (ref 4.0–10.5)

## 2011-08-20 LAB — URINALYSIS, ROUTINE W REFLEX MICROSCOPIC
Glucose, UA: NEGATIVE mg/dL
Ketones, ur: 40 mg/dL — AB
Leukocytes, UA: NEGATIVE
Nitrite: NEGATIVE
Protein, ur: 100 mg/dL — AB
Urobilinogen, UA: 0.2 mg/dL (ref 0.0–1.0)

## 2011-08-20 LAB — URINE MICROSCOPIC-ADD ON

## 2011-08-20 LAB — PROTIME-INR: Prothrombin Time: 20.7 seconds — ABNORMAL HIGH (ref 11.6–15.2)

## 2011-08-20 MED ORDER — POTASSIUM CHLORIDE CRYS ER 20 MEQ PO TBCR
40.0000 meq | EXTENDED_RELEASE_TABLET | Freq: Once | ORAL | Status: AC
  Administered 2011-08-20: 40 meq via ORAL
  Filled 2011-08-20: qty 1

## 2011-08-20 MED ORDER — ACETAMINOPHEN 650 MG RE SUPP: 650.0000 mg | Freq: Four times a day (QID) | RECTAL | Status: DC | PRN

## 2011-08-20 MED ORDER — PANTOPRAZOLE SODIUM 40 MG PO TBEC
40.0000 mg | DELAYED_RELEASE_TABLET | Freq: Every day | ORAL | Status: DC
  Administered 2011-08-20 – 2011-08-21 (×2): 40 mg via ORAL
  Filled 2011-08-20 (×2): qty 1

## 2011-08-20 MED ORDER — SODIUM CHLORIDE 0.9 % IV SOLN: Freq: Once | INTRAVENOUS | Status: DC

## 2011-08-20 MED ORDER — LAMOTRIGINE 25 MG PO TABS
25.0000 mg | ORAL_TABLET | Freq: Two times a day (BID) | ORAL | Status: DC
  Administered 2011-08-20 – 2011-09-04 (×27): 25 mg via ORAL
  Filled 2011-08-20 (×33): qty 1

## 2011-08-20 MED ORDER — CARBAMAZEPINE 200 MG PO TABS
200.0000 mg | ORAL_TABLET | Freq: Two times a day (BID) | ORAL | Status: DC
  Administered 2011-08-20 – 2011-09-04 (×27): 200 mg via ORAL
  Filled 2011-08-20 (×33): qty 1

## 2011-08-20 MED ORDER — SODIUM CHLORIDE 0.9 % IV SOLN
Freq: Once | INTRAVENOUS | Status: AC
Start: 2011-08-20 — End: 2011-08-20
  Administered 2011-08-20: 23:00:00 via INTRAVENOUS

## 2011-08-20 MED ORDER — METOPROLOL TARTRATE 50 MG PO TABS
50.0000 mg | ORAL_TABLET | Freq: Two times a day (BID) | ORAL | Status: DC
  Administered 2011-08-20 – 2011-08-24 (×6): 50 mg via ORAL
  Filled 2011-08-20 (×10): qty 1

## 2011-08-20 MED ORDER — ONDANSETRON HCL 4 MG PO TABS: 4.0000 mg | ORAL_TABLET | Freq: Four times a day (QID) | ORAL | Status: DC | PRN

## 2011-08-20 MED ORDER — LORAZEPAM 1 MG PO TABS: 1.0000 mg | ORAL_TABLET | Freq: Two times a day (BID) | ORAL | Status: DC

## 2011-08-20 MED ORDER — ACETAMINOPHEN 325 MG PO TABS
650.0000 mg | ORAL_TABLET | Freq: Four times a day (QID) | ORAL | Status: DC | PRN
  Administered 2011-08-20: 650 mg via ORAL
  Filled 2011-08-20: qty 2

## 2011-08-20 MED ORDER — POTASSIUM CHLORIDE IN NACL 20-0.9 MEQ/L-% IV SOLN
INTRAVENOUS | Status: DC
  Administered 2011-08-20 – 2011-08-22 (×4): via INTRAVENOUS
  Filled 2011-08-20 (×7): qty 1000

## 2011-08-20 MED ORDER — AMLODIPINE BESYLATE 10 MG PO TABS
10.0000 mg | ORAL_TABLET | Freq: Every day | ORAL | Status: DC
  Administered 2011-08-20 – 2011-08-21 (×2): 10 mg via ORAL
  Filled 2011-08-20 (×3): qty 1

## 2011-08-20 MED ORDER — WARFARIN SODIUM 2 MG PO TABS: 2.0000 mg | ORAL_TABLET | ORAL | Status: DC

## 2011-08-20 MED ORDER — ONDANSETRON HCL 4 MG/2ML IJ SOLN
4.0000 mg | Freq: Four times a day (QID) | INTRAMUSCULAR | Status: DC | PRN
  Administered 2011-08-27 – 2011-08-31 (×5): 4 mg via INTRAVENOUS
  Filled 2011-08-20 (×6): qty 2

## 2011-08-20 MED ORDER — ESTRADIOL 0.1 MG/GM VA CREA
2.0000 g | TOPICAL_CREAM | VAGINAL | Status: DC
  Administered 2011-08-20 – 2011-08-29 (×5): 2 g via VAGINAL
  Filled 2011-08-20 (×2): qty 42.5

## 2011-08-20 MED ORDER — LISINOPRIL 40 MG PO TABS
40.0000 mg | ORAL_TABLET | Freq: Every day | ORAL | Status: DC
  Administered 2011-08-20 – 2011-08-21 (×2): 40 mg via ORAL
  Filled 2011-08-20 (×3): qty 1

## 2011-08-20 MED ORDER — VITAMIN D3 25 MCG (1000 UNIT) PO TABS
4000.0000 [IU] | ORAL_TABLET | Freq: Every day | ORAL | Status: DC
  Administered 2011-08-20 – 2011-08-21 (×2): 4000 [IU] via ORAL
  Filled 2011-08-20 (×3): qty 4

## 2011-08-20 MED ORDER — WARFARIN SODIUM 1 MG PO TABS
1.0000 mg | ORAL_TABLET | Freq: Once | ORAL | Status: AC
  Administered 2011-08-20: 1 mg via ORAL
  Filled 2011-08-20: qty 1

## 2011-08-20 MED ORDER — LORAZEPAM 0.5 MG PO TABS
1.0000 mg | ORAL_TABLET | Freq: Every day | ORAL | Status: DC
  Administered 2011-08-21 – 2011-08-26 (×4): 1 mg via ORAL
  Filled 2011-08-20 (×2): qty 2
  Filled 2011-08-20 (×2): qty 1

## 2011-08-20 NOTE — ED Notes (Signed)
Assisted RN with 14 FR Foley Cathter good urine out put

## 2011-08-20 NOTE — Progress Notes (Signed)
ANTICOAGULATION CONSULT NOTE - Initial Consult  Pharmacy Consult for Warfarin Indication: atrial fibrillation and history of CVA  Allergies  Allergen Reactions  . Ciprofloxacin     REACTION: diarrhea/yeast infection  . Penicillins Hives    Patient Measurements: Weight: 163 lb 2.3 oz (74 kg)  Vital Signs: Temp: 100.4 F (38 C) (11/26 2004) Temp src: Oral (11/26 2004) BP: 169/81 mmHg (11/26 2004) Pulse Rate: 120  (11/26 2004)  Labs:  [facility] 08/20/11 1451 08/20/11 1432  HGB 12.6 --  HCT 37.4 --  PLT 186 --  APTT -- --  LABPROT -- 20.7*  INR -- 1.74*  HEPARINUNFRC -- --  CREATININE 0.90 --  CKTOTAL -- --  CKMB -- --  TROPONINI -- --   The CrCl is unknown because both a height and weight (above a minimum accepted value) are required for this calculation.  Medical History: Past Medical History  Diagnosis Date  . Depressive disorder, not elsewhere classified   . Atrial fibrillation   . Unspecified transient cerebral ischemia   . Unspecified cerebral artery occlusion with cerebral infarction   . Unspecified essential hypertension   . Diverticulosis of colon (without mention of hemorrhage)   . Stricture and stenosis of esophagus   . Seizure disorder   . GERD (gastroesophageal reflux disease)   . IBS (irritable bowel syndrome)     Medications:  Prescriptions prior to admission  Medication Sig Dispense Refill  . amLODipine (NORVASC) 10 MG tablet Take 10 mg by mouth daily.       . carbamazepine (TEGRETOL) 200 MG tablet Take 200 mg by mouth 2 (two) times daily.        . cholecalciferol (VITAMIN D) 1000 UNITS tablet Take 4,000 Units by mouth daily.        Marland Kitchen conjugated estrogens (PREMARIN) vaginal cream Place 30 g vaginally daily.        Marland Kitchen estradiol (ESTRACE) 0.1 MG/GM vaginal cream Place 2 g vaginally 3 (three) times a week.        . hyoscyamine (LEVSIN SL) 0.125 MG SL tablet Place 0.125 mg under the tongue every 4 (four) hours as needed. For stomach cramps       .  lamoTRIgine (LAMICTAL) 25 MG tablet Take 25 mg by mouth 2 (two) times daily.        Marland Kitchen lisinopril (PRINIVIL,ZESTRIL) 40 MG tablet Take 40 mg by mouth daily.       Marland Kitchen LORazepam (ATIVAN) 1 MG tablet Take 1 mg by mouth at bedtime.        . metoprolol (LOPRESSOR) 50 MG tablet Take 50 mg by mouth 2 (two) times daily.       . ondansetron (ZOFRAN) 4 MG tablet Take 4 mg by mouth at bedtime.       . pantoprazole (PROTONIX) 40 MG tablet Take 40 mg by mouth daily.        Marland Kitchen saccharomyces boulardii (FLORASTOR) 250 MG capsule Take 250 mg by mouth 2 (two) times daily.        Marland Kitchen warfarin (COUMADIN) 2 MG tablet Take 2 mg by mouth as directed. Takes( 1/2 tablet ) 1mg  on  Mon Wed and Friday then (1 tablet) 2mg  on Tues Thurs Sat and Sunday        Assessment: 76 yo F admitted with high fevers and diarrhea x 1 day.  To continue warfarin for atrial fibrillation, initial INR 1.74 is subtherapeutic. Goal of Therapy:  INR 2-3   Plan:  1. Warfarin 1 mg PO  tonight. 2. Daily INR, follow closely in setting acute illness and possible antibiotic course for C.Diff  Lovenia Kim Pharm.D., BCPS 08/20/2011 10:03 PM [phone]     Kadisha Goodine Merrily Brittle 08/20/2011,10:00 PM

## 2011-08-20 NOTE — ED Provider Notes (Signed)
Medical screening examination/treatment/procedure(s) were conducted as a shared visit with non-physician practitioner(s) and myself.  I personally evaluated the patient during the encounter  Pt w fever, diarrhea. abd soft nt. Ivf. cdiff pending. Admit.  Suzi Roots, MD 08/20/11 2207

## 2011-08-20 NOTE — ED Notes (Signed)
Report called to RN on 2000

## 2011-08-20 NOTE — ED Provider Notes (Signed)
History     CSN: [id number] Arrival date & time: 08/20/2011  2:09 PM   First MD Initiated Contact with Patient 08/20/11 1412      Chief Complaint  Patient presents with  . Altered Mental Status    (Consider location/radiation/quality/duration/timing/severity/associated sxs/prior treatment) Patient is a 75 y.o. female presenting with general illness. The history is provided by the patient. No language interpreter was used.  Illness  The current episode started today. The onset was sudden. The problem occurs continuously. The problem has been unchanged. The problem is severe. The symptoms are relieved by nothing. The symptoms are aggravated by nothing. Associated symptoms include a fever and diarrhea. The fever has been present for less than 1 day. The maximum temperature noted was 102.2 to 104.0 F. The temperature was taken using an oral thermometer. She has been less active. She has been drinking less than usual. Urine output has been normal. There were no sick contacts. Recently, medical care has been given by a specialist.  Family reports pt has not been acting normally today. Family reports pt has had a cva in the past.   Past Medical History  Diagnosis Date  . Depressive disorder, not elsewhere classified   . Atrial fibrillation   . Unspecified transient cerebral ischemia   . Unspecified cerebral artery occlusion with cerebral infarction   . Unspecified essential hypertension   . Diverticulosis of colon (without mention of hemorrhage)   . Stricture and stenosis of esophagus   . Seizure disorder   . GERD (gastroesophageal reflux disease)     Past Surgical History  Procedure Date  . Hemorrhoid surgery   . Benign braintumor     Removal    Family History  Problem Relation Age of Onset  . Heart disease Mother   . Colon cancer Neg Hx     History  Substance Use Topics  . Smoking status: Never Smoker   . Smokeless tobacco: Never Used  . Alcohol Use: No    OB History      Grav Para Term Preterm Abortions TAB SAB Ect Mult Living                  Review of Systems  Constitutional: Positive for fever.  Gastrointestinal: Positive for diarrhea.  All other systems reviewed and are negative.    Allergies  Ciprofloxacin and Penicillins  Home Medications   Current Outpatient Rx  Name Route Sig Dispense Refill  . AMLODIPINE BESYLATE 10 MG PO TABS Oral Take 10 mg by mouth daily.     Marland Kitchen CARBAMAZEPINE 200 MG PO TABS Oral Take 200 mg by mouth 2 (two) times daily.      Marland Kitchen VITAMIN D 1000 UNITS PO TABS Oral Take 4,000 Units by mouth daily.      Marland Kitchen ESTROGENS, CONJUGATED 0.625 MG/GM VA CREA Vaginal Place 30 g vaginally daily.      Marland Kitchen ESTRADIOL 0.1 MG/GM VA CREA Vaginal Place 2 g vaginally 3 (three) times a week.      Marland Kitchen HYOSCYAMINE SULFATE 0.125 MG SL SUBL Sublingual Place 0.125 mg under the tongue every 4 (four) hours as needed. For stomach cramps     . LAMOTRIGINE 25 MG PO TABS Oral Take 25 mg by mouth 2 (two) times daily.      Marland Kitchen LISINOPRIL 40 MG PO TABS Oral Take 40 mg by mouth daily.     Marland Kitchen LORAZEPAM 1 MG PO TABS Oral Take 1 mg by mouth at bedtime.      Marland Kitchen  METOPROLOL TARTRATE 50 MG PO TABS Oral Take 50 mg by mouth 2 (two) times daily.     Marland Kitchen ONDANSETRON HCL 4 MG PO TABS Oral Take 4 mg by mouth at bedtime.     Marland Kitchen PANTOPRAZOLE SODIUM 40 MG PO TBEC Oral Take 40 mg by mouth daily.      Marland Kitchen SACCHAROMYCES BOULARDII 250 MG PO CAPS Oral Take 250 mg by mouth 2 (two) times daily.      . WARFARIN SODIUM 2 MG PO TABS Oral Take 2 mg by mouth as directed. Takes( 1/2 tablet ) 1mg  on  Mon Wed and Friday then (1 tablet) 2mg  on Tues Thurs Sat and Sunday      BP 149/82  Pulse 116  Temp(Src) 102.9 F (39.4 C) (Oral)  Resp 16  SpO2 96%  Physical Exam  Nursing note and vitals reviewed. Constitutional: She is oriented to person, place, and time. She appears well-developed and well-nourished.  HENT:  Head: Normocephalic and atraumatic.  Right Ear: External ear normal.  Left Ear:  External ear normal.  Nose: Nose normal.  Mouth/Throat: Oropharynx is clear and moist.  Eyes: Conjunctivae and EOM are normal. Pupils are equal, round, and reactive to light.  Neck: Normal range of motion. Neck supple.  Cardiovascular: Normal rate and regular rhythm.   Pulmonary/Chest: Effort normal and breath sounds normal.  Abdominal: Soft. Bowel sounds are normal.  Musculoskeletal: Normal range of motion.  Neurological: She is alert and oriented to person, place, and time. She has normal reflexes.  Skin: Skin is warm and dry.  Psychiatric: She has a normal mood and affect.    ED Course  Procedures (including critical care time)  Labs Reviewed  CBC - Abnormal; Notable for the following:    WBC 16.2 (*)    All other components within normal limits  DIFFERENTIAL - Abnormal; Notable for the following:    Neutrophils Relative 87 (*)    Neutro Abs 14.1 (*)    Lymphocytes Relative 3 (*)    Lymphs Abs 0.5 (*)    Monocytes Absolute 1.6 (*)    All other components within normal limits  URINALYSIS, ROUTINE W REFLEX MICROSCOPIC  COMPREHENSIVE METABOLIC PANEL  LACTIC ACID, PLASMA  PROTIME-INR   No results found.   No diagnosis found.    MDM   Date: 08/20/2011  Rate: 112  Rhythm: atrial fibrillation  QRS Axis: normal  Intervals: normal  ST/T Wave abnormalities: normal  Conduction Disutrbances:none  Narrative Interpretation:   Old EKG Reviewed: unchanged   Results for orders placed during the hospital encounter of 08/20/11  URINALYSIS, ROUTINE W REFLEX MICROSCOPIC      Component Value Range   Color, Urine YELLOW  YELLOW    Appearance CLEAR  CLEAR    Specific Gravity, Urine 1.020  1.005 - 1.030    pH 6.5  5.0 - 8.0    Glucose, UA NEGATIVE  NEGATIVE (mg/dL)   Hgb urine dipstick MODERATE (*) NEGATIVE    Bilirubin Urine NEGATIVE  NEGATIVE    Ketones, ur 40 (*) NEGATIVE (mg/dL)   Protein, ur 045 (*) NEGATIVE (mg/dL)   Urobilinogen, UA 0.2  0.0 - 1.0 (mg/dL)   Nitrite  NEGATIVE  NEGATIVE    Leukocytes, UA NEGATIVE  NEGATIVE   CBC      Component Value Range   WBC 16.2 (*) 4.0 - 10.5 (K/uL)   RBC 3.91  3.87 - 5.11 (MIL/uL)   Hemoglobin 12.6  12.0 - 15.0 (g/dL)   HCT 37.4  36.0 - 46.0 (%)   MCV 95.7  78.0 - 100.0 (fL)   MCH 32.2  26.0 - 34.0 (pg)   MCHC 33.7  30.0 - 36.0 (g/dL)   RDW 78.2  95.6 - 21.3 (%)   Platelets 186  150 - 400 (K/uL)  DIFFERENTIAL      Component Value Range   Neutrophils Relative 87 (*) 43 - 77 (%)   Neutro Abs 14.1 (*) 1.7 - 7.7 (K/uL)   Lymphocytes Relative 3 (*) 12 - 46 (%)   Lymphs Abs 0.5 (*) 0.7 - 4.0 (K/uL)   Monocytes Relative 10  3 - 12 (%)   Monocytes Absolute 1.6 (*) 0.1 - 1.0 (K/uL)   Eosinophils Relative 0  0 - 5 (%)   Eosinophils Absolute 0.0  0.0 - 0.7 (K/uL)   Basophils Relative 0  0 - 1 (%)   Basophils Absolute 0.0  0.0 - 0.1 (K/uL)  COMPREHENSIVE METABOLIC PANEL      Component Value Range   Sodium 139  135 - 145 (mEq/L)   Potassium 2.9 (*) 3.5 - 5.1 (mEq/L)   Chloride 100  96 - 112 (mEq/L)   CO2 27  19 - 32 (mEq/L)   Glucose, Bld 146 (*) 70 - 99 (mg/dL)   BUN 10  6 - 23 (mg/dL)   Creatinine, Ser 0.86  0.50 - 1.10 (mg/dL)   Calcium 9.2  8.4 - 57.8 (mg/dL)   Total Protein 6.8  6.0 - 8.3 (g/dL)   Albumin 3.3 (*) 3.5 - 5.2 (g/dL)   AST 17  0 - 37 (U/L)   ALT 10  0 - 35 (U/L)   Alkaline Phosphatase 55  39 - 117 (U/L)   Total Bilirubin 0.7  0.3 - 1.2 (mg/dL)   GFR calc non Af Amer 62 (*) >90 (mL/min)   GFR calc Af Amer 72 (*) >90 (mL/min)  LACTIC ACID, PLASMA      Component Value Range   Lactic Acid, Venous 1.8  0.5 - 2.2 (mmol/L)  PROTIME-INR      Component Value Range   Prothrombin Time 20.7 (*) 11.6 - 15.2 (seconds)   INR 1.74 (*) 0.00 - 1.49   URINE MICROSCOPIC-ADD ON      Component Value Range   Squamous Epithelial / LPF RARE  RARE    RBC / HPF 7-10  <3 (RBC/hpf)   Bacteria, UA RARE  RARE    Urine-Other MUCOUS PRESENT     Dg Chest 2 View  08/20/2011  *RADIOLOGY REPORT*  Clinical Data:  Fever and unsteady gait.  CHEST - 2 VIEW  Comparison: 03/09/2011  Findings: Two views of the chest demonstrate clear lungs. Heart and mediastinum are within normal limits.  Trachea is midline.  No evidence for airspace disease or effusions.  IMPRESSION: No acute chest findings.  Original Report Authenticated By: Richarda Overlie, M.D.   Ct Head Wo Contrast  08/20/2011  *RADIOLOGY REPORT*  Clinical Data: Altered mental status.  Several falls yesterday. Unsteady gait today.  Recent urinary tract infection.  History of strokes, seizures, hypertension.  CT HEAD WITHOUT CONTRAST  Technique:  Contiguous axial images were obtained from the base of the skull through the vertex without contrast.  Comparison: 03/08/2011  Findings: There is central cortical atrophy.  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  The patient has had a frontal craniotomy.  Significant periventricular white matter changes appears stable.  No new intra or extra-axial fluid collections or masses.  No intracranial hemorrhage.  Visualized  paranasal and mastoid air cells are well-aerated.  IMPRESSION:  1.  Postoperative changes in the left frontal lobe. 2.  Significant atrophy and small vessel disease. 3. No evidence for acute intracranial abnormality.  Original Report Authenticated By: Patterson Hammersmith, M.D.   Nm Gastric Emptying  08/17/2011  *RADIOLOGY REPORT*  Clinical Data:  Abdominal pain  NUCLEAR MEDICINE GASTRIC EMPTYING SCAN  Technique:  After oral ingestion of radiolabeled meal, sequential abdominal images were obtained for 120 minutes.  Residual percentage of activity remaining within the stomach was calculated at 60 and 120 minutes.  Radiopharmaceutical: 2.0 mCi Tc-60m sulfur colloid.  Comparison:  None  Findings: After 120 minutes there is 23% radiotracer activity remaining in the stomach.  (Normal value is less than 30% remaining at 2 hours)  IMPRESSION:  1.  Normal gastric emptying.  Original Report Authenticated By: Rosealee Albee, M.D.       I spoke to Dr. Geoffery Spruce who will admit pt to telemetry at [facility].  Langston Masker, [place] 08/20/11 1815

## 2011-08-20 NOTE — H&P (Signed)
PCP:   MCNEILL,WENDY, MD, MD   Chief Complaint:  Fever and diarrhea  HPI: 75 y.o. Female with h/o diverticulosis and IBS, 1 month with nausea presumed to be due to Keppra, p/w high fevers (up to 102.9) and chills, lethargy and diarrhea x 1 day. Hx taken mainly from her son who was sitting at the bedside. Pt was quiet and somnolent. Denied abdominal pain, n/v. Denied melena or hematochezia. No recent travel or exposure to sick contacts. No recent Abx use. Last real meal she had was on Thanksgiving day and even then she did not ate much.   Allergies:   Allergies  Allergen Reactions  . Ciprofloxacin     REACTION: diarrhea/yeast infection  . Penicillins Hives      Past Medical History  Diagnosis Date  . Depressive disorder, not elsewhere classified   . Atrial fibrillation   . Unspecified transient cerebral ischemia   . Unspecified cerebral artery occlusion with cerebral infarction   . Unspecified essential hypertension   . Diverticulosis of colon (without mention of hemorrhage)   . Stricture and stenosis of esophagus   . Seizure disorder   . GERD (gastroesophageal reflux disease)   . IBS (irritable bowel syndrome)     Past Surgical History  Procedure Date  . Hemorrhoid surgery   . Benign braintumor     Removal    Prior to Admission medications   Medication Sig Start Date End Date Taking? Authorizing Provider  amLODipine (NORVASC) 10 MG tablet Take 10 mg by mouth daily.    Yes Historical Provider, MD  carbamazepine (TEGRETOL) 200 MG tablet Take 200 mg by mouth 2 (two) times daily.     Yes Historical Provider, MD  cholecalciferol (VITAMIN D) 1000 UNITS tablet Take 4,000 Units by mouth daily.     Yes Historical Provider, MD  conjugated estrogens (PREMARIN) vaginal cream Place 30 g vaginally daily.     Yes Historical Provider, MD  estradiol (ESTRACE) 0.1 MG/GM vaginal cream Place 2 g vaginally 3 (three) times a week.     Yes Historical Provider, MD  hyoscyamine (LEVSIN SL)  0.125 MG SL tablet Place 0.125 mg under the tongue every 4 (four) hours as needed. For stomach cramps    Yes Historical Provider, MD  lamoTRIgine (LAMICTAL) 25 MG tablet Take 25 mg by mouth 2 (two) times daily.     Yes Historical Provider, MD  lisinopril (PRINIVIL,ZESTRIL) 40 MG tablet Take 40 mg by mouth daily.    Yes Historical Provider, MD  LORazepam (ATIVAN) 1 MG tablet Take 1 mg by mouth at bedtime.   06/19/11 06/18/12 Yes Louis Meckel, MD  metoprolol (LOPRESSOR) 50 MG tablet Take 50 mg by mouth 2 (two) times daily.    Yes Historical Provider, MD  ondansetron (ZOFRAN) 4 MG tablet Take 4 mg by mouth at bedtime.    Yes Historical Provider, MD  pantoprazole (PROTONIX) 40 MG tablet Take 40 mg by mouth daily.   08/01/11 07/31/12 Yes Louis Meckel, MD  saccharomyces boulardii (FLORASTOR) 250 MG capsule Take 250 mg by mouth 2 (two) times daily.     Yes Historical Provider, MD  warfarin (COUMADIN) 2 MG tablet Take 2 mg by mouth as directed. Takes( 1/2 tablet ) 1mg  on  Mon Wed and Friday then (1 tablet) 2mg  on Tues Thurs Sat and Sunday   Yes Historical Provider, MD    Social History:  reports that she has never smoked. She has never used smokeless tobacco. She reports that she  does not drink alcohol or use illicit drugs. She lives with her husband.  Family History  Problem Relation Age of Onset  . Heart disease Mother   . Colon cancer Neg Hx     Review of Systems:  Constitutional: positive for fever and chills and fatigue. Has slept only 4 hrs in the last 36 hrs.  HEENT: Denies photophobia, eye pain, redness, hearing loss, ear pain, congestion, sore throat, rhinorrhea, sneezing, mouth sores, trouble swallowing, neck pain, neck stiffness and tinnitus.   Respiratory:she had a dry cough this am. Cardiovascular: Denies chest pain, palpitations and leg swelling.  Gastrointestinal: see HPI Genitourinary: Denies dysuria, urgency, frequency, hematuria, flank pain and difficulty urinating.    Musculoskeletal: Denies myalgias, back pain, joint swelling, arthralgias and gait problem.  Skin: Denies pallor, rash and wound.  Neurological: Denies dizziness, seizures, syncope, weakness, light-headedness, numbness and headaches. Son states she suffers from dementia AND aphasia from previous stroke    Physical Exam: Blood pressure 169/81, pulse 120, temperature 100.4 F (38 C), temperature source Oral, resp. rate 16, weight 163 lb 2.3 oz (74 kg), SpO2 95.00%. Constitutional: Vital signs reviewed.  Patient looks ill and tired. No distress. Cooperative but unable to talk much Head: Normocephalic and atraumatic Eyes: PERRL, EOMI, conjunctivae normal, No scleral icterus.  Neck: Supple, Trachea midline normal ROM, No JVD, mass, thyromegaly, or carotid bruit present.  Cardiovascular: irregular rythm, no gallops or murmurs Pulmonary/Chest: CTAB, no wheezes, rales, or rhonchi Abdominal: Soft. Mildly tender lower quadrants, non-distended, bowel sounds are normal, no masses, organomegaly, or guarding present.  GU: no CVA tenderness Neurological: A&O x3, Strenght is normal and symmetric bilaterally, cranial nerve II-XII are grossly intact, no focal motor deficit, sensory intact to light touch bilaterally.  Skin: dry mucosa    Labs on Admission:  Results for orders placed during the hospital encounter of 08/20/11 (from the past 48 hour(s))  PROTIME-INR     Status: Abnormal   Collection Time   08/20/11  2:32 PM      Component Value Range Comment   Prothrombin Time 20.7 (*) 11.6 - 15.2 (seconds)    INR 1.74 (*) 0.00 - 1.49    LACTIC ACID, PLASMA     Status: Normal   Collection Time   08/20/11  2:47 PM      Component Value Range Comment   Lactic Acid, Venous 1.8  0.5 - 2.2 (mmol/L)   CBC     Status: Abnormal   Collection Time   08/20/11  2:51 PM      Component Value Range Comment   WBC 16.2 (*) 4.0 - 10.5 (K/uL)    RBC 3.91  3.87 - 5.11 (MIL/uL)    Hemoglobin 12.6  12.0 - 15.0 (g/dL)     HCT 45.4  09.8 - 11.9 (%)    MCV 95.7  78.0 - 100.0 (fL)    MCH 32.2  26.0 - 34.0 (pg)    MCHC 33.7  30.0 - 36.0 (g/dL)    RDW 14.7  82.9 - 56.2 (%)    Platelets 186  150 - 400 (K/uL)   DIFFERENTIAL     Status: Abnormal   Collection Time   08/20/11  2:51 PM      Component Value Range Comment   Neutrophils Relative 87 (*) 43 - 77 (%)    Neutro Abs 14.1 (*) 1.7 - 7.7 (K/uL)    Lymphocytes Relative 3 (*) 12 - 46 (%)    Lymphs Abs 0.5 (*) 0.7 - 4.0 (  K/uL)    Monocytes Relative 10  3 - 12 (%)    Monocytes Absolute 1.6 (*) 0.1 - 1.0 (K/uL)    Eosinophils Relative 0  0 - 5 (%)    Eosinophils Absolute 0.0  0.0 - 0.7 (K/uL)    Basophils Relative 0  0 - 1 (%)    Basophils Absolute 0.0  0.0 - 0.1 (K/uL)   COMPREHENSIVE METABOLIC PANEL     Status: Abnormal   Collection Time   08/20/11  2:51 PM      Component Value Range Comment   Sodium 139  135 - 145 (mEq/L)    Potassium 2.9 (*) 3.5 - 5.1 (mEq/L)    Chloride 100  96 - 112 (mEq/L)    CO2 27  19 - 32 (mEq/L)    Glucose, Bld 146 (*) 70 - 99 (mg/dL)    BUN 10  6 - 23 (mg/dL)    Creatinine, Ser 1.61  0.50 - 1.10 (mg/dL)    Calcium 9.2  8.4 - 10.5 (mg/dL)    Total Protein 6.8  6.0 - 8.3 (g/dL)    Albumin 3.3 (*) 3.5 - 5.2 (g/dL)    AST 17  0 - 37 (U/L)    ALT 10  0 - 35 (U/L)    Alkaline Phosphatase 55  39 - 117 (U/L)    Total Bilirubin 0.7  0.3 - 1.2 (mg/dL)    GFR calc non Af Amer 62 (*) >90 (mL/min)    GFR calc Af Amer 72 (*) >90 (mL/min)   URINALYSIS, ROUTINE W REFLEX MICROSCOPIC     Status: Abnormal   Collection Time   08/20/11  3:22 PM      Component Value Range Comment   Color, Urine YELLOW  YELLOW     Appearance CLEAR  CLEAR     Specific Gravity, Urine 1.020  1.005 - 1.030     pH 6.5  5.0 - 8.0     Glucose, UA NEGATIVE  NEGATIVE (mg/dL)    Hgb urine dipstick MODERATE (*) NEGATIVE     Bilirubin Urine NEGATIVE  NEGATIVE     Ketones, ur 40 (*) NEGATIVE (mg/dL)    Protein, ur 096 (*) NEGATIVE (mg/dL)    Urobilinogen, UA 0.2   0.0 - 1.0 (mg/dL)    Nitrite NEGATIVE  NEGATIVE     Leukocytes, UA NEGATIVE  NEGATIVE    URINE MICROSCOPIC-ADD ON     Status: Normal   Collection Time   08/20/11  3:22 PM      Component Value Range Comment   Squamous Epithelial / LPF RARE  RARE     RBC / HPF 7-10  <3 (RBC/hpf)    Bacteria, UA RARE  RARE     Urine-Other MUCOUS PRESENT       Radiological Exams on Admission: No results found.  Assessment/Plan Principal Problem:  *Diarrhea of infectious origin Active Problems:  Hypokalemia  Dehydration  Atrial fibrillation, chronic. Sub therapeutic INR and RVR  HTN  Seizures  Plan: Admit to telemetry NS 1000 cc IV bolus NS w KCL 20 meq at 125 cc/hr after bolus given Stool studies: wbc, hemocult, lactoferrin, culture, C diff by PCR Blood cultures x 2 now CT abdomen/pelvis w wo contrast r/o intrabdominal infection/abscess/diverticulitis Hold ABX until microbiology results are back (C diff) Warfarin per pharmD Repeat K and CBC in am.  Plan of care discussed with pt and her son who showed good understanding and agreed with above  Time Spent on Admission:  1 hour  Jonny Ruiz 08/20/2011, 9:32 PM

## 2011-08-20 NOTE — ED Notes (Signed)
Per EMS, they were called to home of pt for "stroke-like symptoms". Pt was disoriented upon EMS arrival. Per EMS, pt has negative stroke scale and is in AFIB with h/o afib.

## 2011-08-21 ENCOUNTER — Telehealth: Payer: Self-pay

## 2011-08-21 ENCOUNTER — Inpatient Hospital Stay (HOSPITAL_COMMUNITY): Payer: Medicare Other

## 2011-08-21 LAB — CBC
MCH: 32.5 pg (ref 26.0–34.0)
MCHC: 33.4 g/dL (ref 30.0–36.0)
Platelets: 197 10*3/uL (ref 150–400)
RDW: 13.8 % (ref 11.5–15.5)

## 2011-08-21 LAB — COMPREHENSIVE METABOLIC PANEL
ALT: 12 U/L (ref 0–35)
AST: 16 U/L (ref 0–37)
Alkaline Phosphatase: 66 U/L (ref 39–117)
CO2: 24 mEq/L (ref 19–32)
Chloride: 107 mEq/L (ref 96–112)
GFR calc non Af Amer: 55 mL/min — ABNORMAL LOW (ref >90)
Sodium: 142 mEq/L (ref 135–145)
Total Bilirubin: 0.7 mg/dL (ref 0.3–1.2)

## 2011-08-21 LAB — PROTIME-INR: Prothrombin Time: 23.5 seconds — ABNORMAL HIGH (ref 11.6–15.2)

## 2011-08-21 MED ORDER — CHLORHEXIDINE GLUCONATE CLOTH 2 % EX PADS
6.0000 | MEDICATED_PAD | Freq: Every day | CUTANEOUS | Status: AC
  Administered 2011-08-22 – 2011-08-26 (×5): 6 via TOPICAL

## 2011-08-21 MED ORDER — POTASSIUM CHLORIDE CRYS ER 20 MEQ PO TBCR
40.0000 meq | EXTENDED_RELEASE_TABLET | ORAL | Status: AC
  Administered 2011-08-21 (×2): 40 meq via ORAL
  Filled 2011-08-21 (×2): qty 2

## 2011-08-21 MED ORDER — METRONIDAZOLE 500 MG PO TABS
500.0000 mg | ORAL_TABLET | Freq: Three times a day (TID) | ORAL | Status: DC
  Administered 2011-08-21 (×2): 500 mg via ORAL
  Filled 2011-08-21 (×6): qty 1

## 2011-08-21 MED ORDER — WARFARIN SODIUM 1 MG PO TABS
1.0000 mg | ORAL_TABLET | Freq: Once | ORAL | Status: AC
  Administered 2011-08-21: 1 mg via ORAL
  Filled 2011-08-21: qty 1

## 2011-08-21 MED ORDER — MUPIROCIN 2 % EX OINT
1.0000 "application " | TOPICAL_OINTMENT | Freq: Two times a day (BID) | CUTANEOUS | Status: AC
  Administered 2011-08-21 – 2011-08-26 (×10): 1 via NASAL
  Filled 2011-08-21: qty 22

## 2011-08-21 MED ORDER — IOHEXOL 300 MG/ML  SOLN
100.0000 mL | Freq: Once | INTRAMUSCULAR | Status: AC | PRN
  Administered 2011-08-21: 100 mL via INTRAVENOUS

## 2011-08-21 NOTE — Progress Notes (Signed)
Subjective: Feeling weak, no CP, no abdominal pain and no fever today. Patient C. Diff by PCR was positive, now in contact precautions.  Objective: Vital signs in last 24 hours: Temp:  [98 F (36.7 C)-100.4 F (38 C)] 98.7 F (37.1 C) (11/27 1400) Pulse Rate:  [59-167] 121  (11/27 1400) Resp:  [16-22] 22  (11/27 1400) BP: (125-172)/(65-90) 146/90 mmHg (11/27 1400) SpO2:  [80 %-95 %] 90 % (11/27 1400) Weight:  [74 kg (163 lb 2.3 oz)] 163 lb 2.3 oz (74 kg) (11/26 2259) Weight change:  Last BM Date: 08/20/11  Intake/Output from previous day: 11/26 0701 - 11/27 0700 In: -  Out: 250 [Urine:250] Total I/O In: 120 [P.O.:120] Out: 802 [Urine:800; Stool:2]   Physical Exam: General: Alert, awake, oriented x3, in no acute distress. HEENT: No bruits, no goiter. Heart: Regular rate and rhythm, without murmurs, rubs, gallops. Lungs: Clear to auscultation bilaterally. Abdomen: Soft, nontender, nondistended, positive bowel sounds. Extremities: No clubbing or cyanosis, trace to 1+ edema bilaterally. Neuro: Grossly intact, nonfocal.    Lab Results: Basic Metabolic Panel:  Basename 08/21/11 0520 08/20/11 1451  NA 142 139  K 3.3* 2.9*  CL 107 100  CO2 24 27  GLUCOSE 124* 146*  BUN 11 10  CREATININE 0.99 0.90  CALCIUM 8.8 9.2  MG -- --  PHOS -- --   Liver Function Tests:  [facility] 08/21/11 0520 08/20/11 1451  AST 16 17  ALT 12 10  ALKPHOS 66 55  BILITOT 0.7 0.7  PROT 6.2 6.8  ALBUMIN 2.7* 3.3*   CBC:  Basename 08/21/11 0520 08/20/11 1451  WBC 18.4* 16.2*  NEUTROABS -- 14.1*  HGB 11.7* 12.6  HCT 35.0* 37.4  MCV 97.2 95.7  PLT 197 186   Coagulation:  Basename 08/21/11 0520 08/20/11 1432  LABPROT 23.5* 20.7*  INR 2.05* 1.74*    Misc. Labs:  Recent Results (from the past 240 hour(s))  CLOSTRIDIUM DIFFICILE BY PCR     Status: Abnormal   Collection Time   08/21/11  8:31 AM      Component Value Range Status Comment   C difficile by pcr POSITIVE (*) NEGATIVE   Final     Studies/Results: Dg Chest 2 View  08/20/2011  *RADIOLOGY REPORT*  Clinical Data: Fever and unsteady gait.  CHEST - 2 VIEW  Comparison: 03/09/2011  Findings: Two views of the chest demonstrate clear lungs. Heart and mediastinum are within normal limits.  Trachea is midline.  No evidence for airspace disease or effusions.  IMPRESSION: No acute chest findings.  Original Report Authenticated By: Richarda Overlie, M.D.   Ct Head Wo Contrast  08/20/2011  *RADIOLOGY REPORT*  Clinical Data: Altered mental status.  Several falls yesterday. Unsteady gait today.  Recent urinary tract infection.  History of strokes, seizures, hypertension.  CT HEAD WITHOUT CONTRAST  Technique:  Contiguous axial images were obtained from the base of the skull through the vertex without contrast.  Comparison: 03/08/2011  Findings: There is central cortical atrophy.  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  The patient has had a frontal craniotomy.  Significant periventricular white matter changes appears stable.  No new intra or extra-axial fluid collections or masses.  No intracranial hemorrhage.  Visualized paranasal and mastoid air cells are well-aerated.  IMPRESSION:  1.  Postoperative changes in the left frontal lobe. 2.  Significant atrophy and small vessel disease. 3. No evidence for acute intracranial abnormality.  Original Report Authenticated By: Patterson Hammersmith, M.D.   Ct Abdomen  Pelvis W Contrast  08/21/2011  *RADIOLOGY REPORT*  Clinical Data: Fever.  Diarrhea.  Abdominal pain.  CT ABDOMEN AND PELVIS WITH CONTRAST  Technique:  Multidetector CT imaging of the abdomen and pelvis was performed following the standard protocol during bolus administration of intravenous contrast.  Contrast: OMNIPAQUE IOHEXOL 300 MG/ML IV SOLN  Comparison: None.  Findings: Images through the lung bases show mild right lower lobe infiltrate, suspicious for pneumonia.  A tiny less than 1 cm right hepatic lobe cyst is  noted but no liver masses are identified.  Gallbladder is unremarkable.  The pancreas, spleen, and adrenal glands are normal in appearance.  Tiny renal cysts are noted bilaterally but there is no evidence of renal mass or hydronephrosis.  A Foley catheter is seen within the bladder which is collapsed.  A subserosal fibroid is seen in the right lower uterine body which measures 4 cm.  Shotty bilateral iliac lymph nodes are seen within the pelvis, none of which are pathologically enlarged.  Wall thickening is seen involving the inferior rectum and anus, and there is soft tissue stranding seen within the presacral fat and the ischiorectal fossae bilaterally.  This is suspicious for proctocolitis.  No abscess identified. Sigmoid diverticulosis is noted, however there is no evidence of diverticulitis.  IMPRESSION:  1.  Findings consistent with proctocolitis.  No evidence of abscess. 2.  4 cm uterine fibroid. 3.  Sigmoid  diverticulosis.  No radiographic evidence of diverticulitis. 4.  Mild asymmetric lower lobe infiltrate; pneumonia cannot be excluded.  Recommend clinical correlation and follow-up by chest radiograph.  Original Report Authenticated By: Danae Orleans, M.D.    Medications: Scheduled Meds:   . sodium chloride   Intravenous Once  . sodium chloride   Intravenous Once  . amLODipine  10 mg Oral Daily  . carbamazepine  200 mg Oral BID  . Chlorhexidine Gluconate Cloth  6 each Topical Q0600  . cholecalciferol  4,000 Units Oral Daily  . estradiol  2 g Vaginal 3 times weekly  . lamoTRIgine  25 mg Oral BID  . lisinopril  40 mg Oral Daily  . LORazepam  1 mg Oral QHS  . metoprolol  50 mg Oral BID  . metroNIDAZOLE  500 mg Oral Q8H  . mupirocin ointment  1 application Nasal BID  . pantoprazole  40 mg Oral Daily  . potassium chloride  40 mEq Oral Once  . warfarin  1 mg Oral Once  . warfarin  1 mg Oral ONCE-1800  . DISCONTD: LORazepam  1 mg Oral BID  . DISCONTD: warfarin  2 mg Oral UD   Continuous  Infusions:   . 0.9 % NaCl with KCl 20 mEq / L 125 mL/hr at 08/21/11 1222   PRN Meds:.acetaminophen, acetaminophen, iohexol, ondansetron (ZOFRAN) IV, ondansetron  Assessment/Plan: 1-Diarrhea, fever and leukocytosis secondary to C. difficile infection: Patient has been placed on contact isolation on treatment with Flagyl 500 mg by mouth 3 times a day has been started. We'll continue providing supportive care for now patient will continue on full liquid diet she said that her appetite is off and eating more than that can make her stomach hurts. PPI will be discontinued secondary to patient's positive Colitis and ongoing tx.  2-Hypokalemia: Will replete potassium and check magnesium level.  3-HTN: Well control and stable, continue current antihypertensive regimen.  4-atrial fibrillation: Continue Coumadin per pharmacy, continue metoprolol.  The rest of the patient's medical problems remained stable and the plan is to continue current  medication regimen.     LOS: 1 day   [name] 08/21/2011, 4:45 PM

## 2011-08-21 NOTE — Telephone Encounter (Signed)
Left message for pt to call back  °

## 2011-08-21 NOTE — Progress Notes (Signed)
ANTICOAGULATION CONSULT NOTE - Follow Up Consult  Pharmacy Consult for Warfarin  Indication: Afib and hx/o CVA  Allergies  Allergen Reactions  . Ciprofloxacin     REACTION: diarrhea/yeast infection  . Penicillins Hives    Patient Measurements: Height: 5\' 6"  (167.6 cm) Weight: 163 lb 2.3 oz (74 kg) IBW/kg (Calculated) : 59.3   Vital Signs: Temp: 98.7 F (37.1 C) (11/27 1400) Temp src: Oral (11/27 1400) BP: 146/90 mmHg (11/27 1400) Pulse Rate: 121  (11/27 1400)  Labs:  Basename 08/21/11 0520 08/20/11 1451 08/20/11 1432  HGB 11.7* 12.6 --  HCT 35.0* 37.4 --  PLT 197 186 --  APTT -- -- --  LABPROT 23.5* -- 20.7*  INR 2.05* -- 1.74*  HEPARINUNFRC -- -- --  CREATININE 0.99 0.90 --  CKTOTAL -- -- --  CKMB -- -- --  TROPONINI -- -- --   Estimated Creatinine Clearance: 52.1 ml/min (by C-G formula based on Cr of 0.99).  Warfarin History: PTA: 2 mg daily EXCEPT for 1 mg on MWF 11/26: Warfarin 1 mg (INR 1.74 >> 2.05)  Assessment: 75 y.o. F on warfarin for Afib with a therapeutic INR this a.m. CDiff cultures came back positive and would expect abx treatment to begin this afternoon. Will dose with this in mind along with diarrhea.  Goal of Therapy:  INR 2-3   Plan:  1. Warfarin 1 mg x 1 dose at 1800 today. 2. Will continue to monitor for any signs/symptoms of bleeding and will follow up with PT/INR in the a.m.   Rolley Sims 08/21/2011,2:32 PM

## 2011-08-21 NOTE — Progress Notes (Signed)
Utilization Review Completed.  Zaide Mcclenahan T  08/21/2011 

## 2011-08-21 NOTE — Telephone Encounter (Signed)
Message copied by Michele Mcalpine on Tue Aug 21, 2011  3:20 PM ------      Message from: Melvia Heaps D      Created: Mon Aug 20, 2011 10:59 AM       Please schedule EGD, inform pt of results

## 2011-08-22 ENCOUNTER — Inpatient Hospital Stay (HOSPITAL_COMMUNITY): Payer: Medicare Other

## 2011-08-22 DIAGNOSIS — J189 Pneumonia, unspecified organism: Secondary | ICD-10-CM

## 2011-08-22 DIAGNOSIS — K529 Noninfective gastroenteritis and colitis, unspecified: Secondary | ICD-10-CM | POA: Insufficient documentation

## 2011-08-22 DIAGNOSIS — I509 Heart failure, unspecified: Secondary | ICD-10-CM

## 2011-08-22 DIAGNOSIS — J9600 Acute respiratory failure, unspecified whether with hypoxia or hypercapnia: Secondary | ICD-10-CM | POA: Diagnosis present

## 2011-08-22 DIAGNOSIS — A0472 Enterocolitis due to Clostridium difficile, not specified as recurrent: Secondary | ICD-10-CM

## 2011-08-22 DIAGNOSIS — R739 Hyperglycemia, unspecified: Secondary | ICD-10-CM | POA: Diagnosis present

## 2011-08-22 LAB — CBC
HCT: 39 % (ref 36.0–46.0)
Hemoglobin: 13.1 g/dL (ref 12.0–15.0)
MCH: 32.7 pg (ref 26.0–34.0)
MCHC: 33.6 g/dL (ref 30.0–36.0)
RDW: 14.1 % (ref 11.5–15.5)

## 2011-08-22 LAB — BLOOD GAS, ARTERIAL
Drawn by: 31101
FIO2: 0.5 %
Patient temperature: 100.3
TCO2: 22 mmol/L (ref 0–100)
pCO2 arterial: 31.6 mmHg — ABNORMAL LOW (ref 35.0–45.0)
pH, Arterial: 7.443 — ABNORMAL HIGH (ref 7.350–7.400)

## 2011-08-22 LAB — BASIC METABOLIC PANEL
BUN: 11 mg/dL (ref 6–23)
Calcium: 8.9 mg/dL (ref 8.4–10.5)
GFR calc Af Amer: 76 mL/min — ABNORMAL LOW (ref >90)
GFR calc non Af Amer: 65 mL/min — ABNORMAL LOW (ref >90)
Glucose, Bld: 168 mg/dL — ABNORMAL HIGH (ref 70–99)
Sodium: 141 mEq/L (ref 135–145)

## 2011-08-22 LAB — PRO B NATRIURETIC PEPTIDE: Pro B Natriuretic peptide (BNP): 2548 pg/mL — ABNORMAL HIGH (ref 0–125)

## 2011-08-22 LAB — POCT I-STAT 3, ART BLOOD GAS (G3+)
TCO2: 23 mmol/L (ref 0–100)
pCO2 arterial: 36.1 mmHg (ref 35.0–45.0)
pH, Arterial: 7.384 (ref 7.350–7.400)

## 2011-08-22 LAB — MAGNESIUM: Magnesium: 1.5 mg/dL (ref 1.5–2.5)

## 2011-08-22 LAB — HEMOGLOBIN A1C
Hgb A1c MFr Bld: 5.8 % — ABNORMAL HIGH (ref <5.7)
Mean Plasma Glucose: 120 mg/dL — ABNORMAL HIGH (ref <117)

## 2011-08-22 LAB — FECAL LACTOFERRIN, QUANT: Fecal Lactoferrin: POSITIVE

## 2011-08-22 MED ORDER — DEXTROSE 5 % IV SOLN
500.0000 mg | INTRAVENOUS | Status: DC
  Administered 2011-08-22 – 2011-08-24 (×3): 500 mg via INTRAVENOUS
  Filled 2011-08-22 (×3): qty 500

## 2011-08-22 MED ORDER — IPRATROPIUM BROMIDE 0.02 % IN SOLN: 0.5000 mg | Freq: Four times a day (QID) | RESPIRATORY_TRACT | Status: DC

## 2011-08-22 MED ORDER — IPRATROPIUM BROMIDE 0.02 % IN SOLN
0.5000 mg | RESPIRATORY_TRACT | Status: AC
  Administered 2011-08-22: 0.5 mg via RESPIRATORY_TRACT

## 2011-08-22 MED ORDER — DEXTROSE 5 % IV SOLN: 1.0000 g | INTRAVENOUS | Status: DC

## 2011-08-22 MED ORDER — ALBUTEROL SULFATE (5 MG/ML) 0.5% IN NEBU
2.5000 mg | INHALATION_SOLUTION | RESPIRATORY_TRACT | Status: AC
  Administered 2011-08-22: 2.5 mg via RESPIRATORY_TRACT

## 2011-08-22 MED ORDER — METHYLPREDNISOLONE SODIUM SUCC 40 MG IJ SOLR
40.0000 mg | Freq: Two times a day (BID) | INTRAMUSCULAR | Status: DC
  Administered 2011-08-22: 40 mg via INTRAVENOUS
  Filled 2011-08-22 (×3): qty 1

## 2011-08-22 MED ORDER — LABETALOL HCL 5 MG/ML IV SOLN: 5.0000 mg | Freq: Four times a day (QID) | INTRAVENOUS | Status: DC | PRN

## 2011-08-22 MED ORDER — METRONIDAZOLE IN NACL 5-0.79 MG/ML-% IV SOLN
500.0000 mg | Freq: Three times a day (TID) | INTRAVENOUS | Status: DC
  Administered 2011-08-22 – 2011-08-24 (×6): 500 mg via INTRAVENOUS
  Filled 2011-08-22 (×8): qty 100

## 2011-08-22 MED ORDER — IPRATROPIUM BROMIDE 0.02 % IN SOLN
0.5000 mg | RESPIRATORY_TRACT | Status: DC | PRN
  Administered 2011-08-22: 0.5 mg via RESPIRATORY_TRACT

## 2011-08-22 MED ORDER — DILTIAZEM HCL 100 MG IV SOLR
5.0000 mg/h | INTRAVENOUS | Status: DC
  Filled 2011-08-22: qty 100

## 2011-08-22 MED ORDER — DEXTROSE 5 % IV SOLN
1.0000 g | INTRAVENOUS | Status: DC
  Administered 2011-08-22 – 2011-08-24 (×3): 1 g via INTRAVENOUS
  Filled 2011-08-22 (×3): qty 10

## 2011-08-22 MED ORDER — DILTIAZEM LOAD VIA INFUSION: 5.0000 mg | Freq: Once | INTRAVENOUS | Status: DC

## 2011-08-22 MED ORDER — SODIUM CHLORIDE 0.9 % IV SOLN
INTRAVENOUS | Status: DC
  Administered 2011-08-22: 05:00:00 via INTRAVENOUS

## 2011-08-22 MED ORDER — WARFARIN SODIUM 1 MG PO TABS
1.0000 mg | ORAL_TABLET | Freq: Once | ORAL | Status: DC
  Filled 2011-08-22: qty 1

## 2011-08-22 MED ORDER — LABETALOL HCL 5 MG/ML IV SOLN: 5.0000 mg | INTRAVENOUS | Status: DC | PRN

## 2011-08-22 MED ORDER — LEVALBUTEROL HCL 1.25 MG/0.5ML IN NEBU
1.2500 mg | INHALATION_SOLUTION | Freq: Three times a day (TID) | RESPIRATORY_TRACT | Status: DC
  Administered 2011-08-23 – 2011-08-25 (×8): 1.25 mg via RESPIRATORY_TRACT
  Filled 2011-08-22 (×10): qty 0.5

## 2011-08-22 MED ORDER — METHYLPREDNISOLONE SODIUM SUCC 125 MG IJ SOLR
INTRAMUSCULAR | Status: AC
  Filled 2011-08-22: qty 2

## 2011-08-22 MED ORDER — LEVALBUTEROL HCL 1.25 MG/0.5ML IN NEBU
1.2500 mg | INHALATION_SOLUTION | Freq: Four times a day (QID) | RESPIRATORY_TRACT | Status: DC | PRN
  Filled 2011-08-22: qty 0.5

## 2011-08-22 MED ORDER — MAGNESIUM SULFATE 50 % IJ SOLN
2.0000 g | Freq: Once | INTRAVENOUS | Status: AC
  Administered 2011-08-22: 2 g via INTRAVENOUS
  Filled 2011-08-22: qty 4

## 2011-08-22 MED ORDER — DILTIAZEM LOAD VIA INFUSION
5.0000 mg | Freq: Once | INTRAVENOUS | Status: AC
  Administered 2011-08-22: 5 mg via INTRAVENOUS
  Filled 2011-08-22: qty 5

## 2011-08-22 MED ORDER — ALBUTEROL SULFATE (5 MG/ML) 0.5% IN NEBU: 2.5000 mg | INHALATION_SOLUTION | RESPIRATORY_TRACT | Status: DC | PRN

## 2011-08-22 MED ORDER — DILTIAZEM HCL 100 MG IV SOLR
5.0000 mg/h | INTRAVENOUS | Status: DC
  Administered 2011-08-22: 5 mg/h via INTRAVENOUS
  Administered 2011-08-22 – 2011-08-23 (×2): 10 mg/h via INTRAVENOUS
  Administered 2011-08-23 (×2): 15 mg/h via INTRAVENOUS
  Administered 2011-08-24: 10 mg/h via INTRAVENOUS
  Filled 2011-08-22: qty 100

## 2011-08-22 MED ORDER — CLONIDINE HCL ER 0.1 MG PO TB12: 0.1000 mg | ORAL_TABLET | Freq: Four times a day (QID) | ORAL | Status: DC | PRN

## 2011-08-22 MED ORDER — INSULIN ASPART 100 UNIT/ML ~~LOC~~ SOLN
0.0000 [IU] | SUBCUTANEOUS | Status: DC
  Administered 2011-08-22: 1 [IU] via SUBCUTANEOUS
  Administered 2011-08-22: 2 [IU] via SUBCUTANEOUS
  Administered 2011-08-22 (×2): 1 [IU] via SUBCUTANEOUS
  Administered 2011-08-23: 2 [IU] via SUBCUTANEOUS
  Administered 2011-08-23 (×3): 1 [IU] via SUBCUTANEOUS
  Administered 2011-08-24 (×3): 2 [IU] via SUBCUTANEOUS
  Administered 2011-08-25: 1 [IU] via SUBCUTANEOUS
  Administered 2011-08-25 – 2011-08-26 (×4): 2 [IU] via SUBCUTANEOUS
  Administered 2011-08-26 – 2011-08-27 (×2): 1 [IU] via SUBCUTANEOUS
  Administered 2011-08-27: 20:00:00 2 [IU] via SUBCUTANEOUS
  Administered 2011-08-27 (×2): 1 [IU] via SUBCUTANEOUS
  Administered 2011-08-27: 2 [IU] via SUBCUTANEOUS
  Administered 2011-08-28: 1 [IU] via SUBCUTANEOUS
  Administered 2011-08-28: 2 [IU] via SUBCUTANEOUS
  Administered 2011-08-29: 16:00:00 1 [IU] via SUBCUTANEOUS
  Administered 2011-08-29: 21:00:00 2 [IU] via SUBCUTANEOUS
  Administered 2011-08-30 – 2011-09-02 (×3): 1 [IU] via SUBCUTANEOUS
  Filled 2011-08-22: qty 3

## 2011-08-22 MED ORDER — ALBUTEROL SULFATE (5 MG/ML) 0.5% IN NEBU
INHALATION_SOLUTION | RESPIRATORY_TRACT | Status: AC
  Filled 2011-08-22: qty 1

## 2011-08-22 MED ORDER — FUROSEMIDE 10 MG/ML IJ SOLN
INTRAMUSCULAR | Status: AC
  Administered 2011-08-22: 20 mg via INTRAVENOUS
  Filled 2011-08-22: qty 4

## 2011-08-22 MED ORDER — WARFARIN SODIUM 5 MG IV SOLR
1.0000 mg | Freq: Once | INTRAVENOUS | Status: AC
  Administered 2011-08-22: 1 mg via INTRAVENOUS
  Filled 2011-08-22: qty 0.5

## 2011-08-22 MED ORDER — CLONIDINE HCL 0.1 MG PO TABS
0.1000 mg | ORAL_TABLET | Freq: Four times a day (QID) | ORAL | Status: DC | PRN
  Administered 2011-08-22: 0.1 mg via ORAL
  Filled 2011-08-22: qty 1

## 2011-08-22 MED ORDER — IPRATROPIUM BROMIDE 0.02 % IN SOLN
0.5000 mg | Freq: Three times a day (TID) | RESPIRATORY_TRACT | Status: DC
  Administered 2011-08-23 – 2011-08-25 (×7): 0.5 mg via RESPIRATORY_TRACT
  Filled 2011-08-22 (×7): qty 2.5

## 2011-08-22 MED ORDER — IPRATROPIUM BROMIDE 0.02 % IN SOLN
RESPIRATORY_TRACT | Status: AC
  Administered 2011-08-22: 0.5 mg via RESPIRATORY_TRACT
  Filled 2011-08-22: qty 2.5

## 2011-08-22 MED ORDER — IPRATROPIUM BROMIDE 0.02 % IN SOLN
0.5000 mg | RESPIRATORY_TRACT | Status: DC
  Administered 2011-08-22 (×4): 0.5 mg via RESPIRATORY_TRACT
  Filled 2011-08-22 (×5): qty 2.5

## 2011-08-22 MED ORDER — DILTIAZEM HCL 25 MG/5ML IV SOLN: 10.0000 mg | Freq: Four times a day (QID) | INTRAVENOUS | Status: DC

## 2011-08-22 MED ORDER — FUROSEMIDE 10 MG/ML IJ SOLN
20.0000 mg | Freq: Two times a day (BID) | INTRAMUSCULAR | Status: DC
  Administered 2011-08-22 – 2011-08-23 (×3): 20 mg via INTRAVENOUS
  Filled 2011-08-22 (×3): qty 2

## 2011-08-22 MED ORDER — DEXTROSE 5 % IV SOLN
3.0000 g | Freq: Once | INTRAVENOUS | Status: AC
  Administered 2011-08-22: 3 g via INTRAVENOUS
  Filled 2011-08-22: qty 6

## 2011-08-22 MED ORDER — FUROSEMIDE 10 MG/ML IJ SOLN
20.0000 mg | Freq: Once | INTRAMUSCULAR | Status: AC
  Administered 2011-08-22: 20 mg via INTRAVENOUS

## 2011-08-22 MED ORDER — DILTIAZEM HCL 25 MG/5ML IV SOLN
10.0000 mg | Freq: Four times a day (QID) | INTRAVENOUS | Status: DC
  Filled 2011-08-22: qty 5

## 2011-08-22 NOTE — Clinical Documentation Improvement (Signed)
CHF DOCUMENTATION CLARIFICATION QUERY  THIS DOCUMENT IS NOT A PERMANENT PART OF THE MEDICAL RECORD  Please update your documentation within the medical record to reflect your response to this query.                                                                                     08/22/11  Dear Dr[facility],  In a better effort to capture your patient's severity of illness, reflect appropriate length of stay and utilization of resources, a review of the patient medical record has revealed the following indicators the diagnosis of Heart Failure.    Based on your clinical judgment, please clarify and document in a progress note and/or discharge summary the clinical condition associated with the following supporting information:  In responding to this query please exercise your independent judgment.  The fact that a query is asked, does not imply that any particular answer is desired or expected.  Possible Clinical Conditions?  Chronic Systolic Congestive Heart Failure Chronic Diastolic Congestive Heart Failure Chronic Systolic & Diastolic Congestive Heart Failure Acute Systolic Congestive Heart Failure Acute Diastolic Congestive Heart Failure Acute Systolic & Diastolic Congestive Heart Failure Acute on Chronic Systolic Congestive Heart Failure Acute on Chronic Diastolic Congestive Heart Failure Acute on Chronic Systolic & Diastolic  Congestive Heart Failure Other Condition________________________________________ Cannot Clinically Determine    Risk Factors:  Likely Diastolic Dysfunction noted per 11/28 progress notes.  Diagnostics:  BNP = 2548 on 08/22/11.  Treatment:     Lasix 10mg  IV on 08/22/11. Lasix 20mg  IV on 08/22/11, then 20mg  IV q 12hrs.  Reviewed:   Thank You,  Sincerely, Marciano Sequin,  Clinical Documentation Specialist:  Pager: [phone]  Health Information Management [place]   TO RESPOND TO THE THIS QUERY, FOLLOW THE  INSTRUCTIONS BELOW:  1. If needed, update documentation for the patient's encounter via the notes activity.  2. Access this query again and click edit on the [employer].  3. After updating, or not, click F2 to complete all highlighted (required) fields concerning your review. Select "additional documentation in the medical record" OR "no additional documentation provided".  4. Click Sign note button.  5. The deficiency will fall out of your InBasket *Please let us know if you are not able to compete this workflow by phone or e-mail (listed below).  I have placed in the problem list (in progress note that her acute respiratory failure is secondary to diastolic CHF exacerbation which is now resolved.

## 2011-08-22 NOTE — Progress Notes (Signed)
Patient ID: [name], female   DOB: November 12, 1935, 75 y.o.   MRN: [id number]  Subjective: No events overnight. Pt minimally verbal and she only responds with yes or no upon questioning.   Objective:  Vital signs in last 24 hours:  Filed Vitals:   08/22/11 0700 08/22/11 0800 08/22/11 0833 08/22/11 0856  BP: 90/52 119/63 119/63   Pulse: 83 85 77   Temp:   97.3 F (36.3 C)   TempSrc:   Axillary   Resp: 23 20 20    Height:      Weight:      SpO2: 98% 97% 97% 97%    Intake/Output from previous day:   Intake/Output Summary (Last 24 hours) at 08/22/11 1029 Last data filed at 08/22/11 0900  Gross per 24 hour  Intake 1349.08 ml  Output   1651 ml  Net -301.92 ml    Physical Exam: General: in no acute distress. HEENT: No bruits, no goiter. Moist mucous membranes, no scleral icterus, no conjunctival pallor. BiPAP mask Heart: Irregular rate and rhythm, without murmurs, rubs, gallops. Lungs: Bilateral diminished air entry, basal rales  Abdomen: Soft, nontender, nondistended, positive bowel sounds. Extremities: No clubbing cyanosis, trace bilateral pitting edema,  positive pedal pulses. Neuro: Sleepy, but wakes up and follows commands, somewhat confused   Lab Results:  Basic Metabolic Panel:    Component Value Date/Time   NA 141 08/22/2011 0235   K 4.3 08/22/2011 0235   CL 107 08/22/2011 0235   CO2 22 08/22/2011 0235   BUN 11 08/22/2011 0235   CREATININE 0.86 08/22/2011 0235   GLUCOSE 168* 08/22/2011 0235   CALCIUM 8.9 08/22/2011 0235   CBC:    Component Value Date/Time   WBC 20.9* 08/22/2011 0235   HGB 13.1 08/22/2011 0235   HCT 39.0 08/22/2011 0235   PLT 225 08/22/2011 0235   MCV 97.3 08/22/2011 0235   NEUTROABS 14.1* 08/20/2011 1451   LYMPHSABS 0.5* 08/20/2011 1451   MONOABS 1.6* 08/20/2011 1451   EOSABS 0.0 08/20/2011 1451   BASOSABS 0.0 08/20/2011 1451      Lab 08/22/11 0235 08/21/11 0520 08/20/11 1451  WBC 20.9* 18.4* 16.2*  HGB 13.1 11.7* 12.6  HCT  39.0 35.0* 37.4  PLT 225 197 186  MCV 97.3 97.2 95.7  MCH 32.7 32.5 32.2  MCHC 33.6 33.4 33.7  RDW 14.1 13.8 13.4  LYMPHSABS -- -- 0.5*  MONOABS -- -- 1.6*  EOSABS -- -- 0.0  BASOSABS -- -- 0.0  BANDABS -- -- --    Lab 08/22/11 0235 08/21/11 0520 08/20/11 1451  NA 141 142 139  K 4.3 3.3* 2.9*  CL 107 107 100  CO2 22 24 27   GLUCOSE 168* 124* 146*  BUN 11 11 10   CREATININE 0.86 0.99 0.90  CALCIUM 8.9 8.8 9.2  MG 1.5 -- --    Lab 08/22/11 0258 08/21/11 0520 08/20/11 1432  INR 2.19* 2.05* 1.74*  PROTIME -- -- --   Cardiac markers: No results found for this basename: CK:3,CKMB:3,TROPONINI:3,MYOGLOBIN:3 in the last 168 hours  Lab 08/22/11 0238  POCBNP 2548.0*   Recent Results (from the past 240 hour(s))  CULTURE, BLOOD (ROUTINE X 2)     Status: Normal (Preliminary result)   Collection Time   08/21/11  1:00 AM      Component Value Range Status Comment   Specimen Description BLOOD LEFT ARM   Final    Special Requests BOTTLES DRAWN AEROBIC AND ANAEROBIC 10CC EA   Final  Setup Time [id number]   Final    Culture     Final    Value:        BLOOD CULTURE RECEIVED NO GROWTH TO DATE CULTURE WILL BE HELD FOR 5 DAYS BEFORE ISSUING A FINAL NEGATIVE REPORT   Report Status PENDING   Incomplete   CULTURE, BLOOD (ROUTINE X 2)     Status: Normal (Preliminary result)   Collection Time   08/21/11  1:15 AM      Component Value Range Status Comment   Specimen Description BLOOD LEFT WRIST   Final    Special Requests BOTTLES DRAWN AEROBIC AND ANAEROBIC 10CC EA   Final    Setup Time [id number]   Final    Culture     Final    Value:        BLOOD CULTURE RECEIVED NO GROWTH TO DATE CULTURE WILL BE HELD FOR 5 DAYS BEFORE ISSUING A FINAL NEGATIVE REPORT   Report Status PENDING   Incomplete   CLOSTRIDIUM DIFFICILE BY PCR     Status: Abnormal   Collection Time   08/21/11  8:31 AM      Component Value Range Status Comment   C difficile by pcr POSITIVE (*) NEGATIVE  Final   STOOL CULTURE      Status: Normal (Preliminary result)   Collection Time   08/21/11  8:32 AM      Component Value Range Status Comment   Specimen Description STOOL   Final    Special Requests NONE   Final    Culture Culture reincubated for better growth   Final    Report Status PENDING   Incomplete   MRSA PCR SCREENING     Status: Abnormal   Collection Time   08/22/11  3:25 AM      Component Value Range Status Comment   MRSA by PCR POSITIVE (*) NEGATIVE  Final     Studies/Results: Dg Chest 2 View August 28, 2011   IMPRESSION: No acute chest findings.  Original Report Authenticated By: Richarda Overlie, M.D.   Ct Head Wo Contrast 08-28-11 IMPRESSION:  1.  Postoperative changes in the left frontal lobe. 2.  Significant atrophy and small vessel disease. 3. No evidence for acute intracranial abnormality.   Ct Abdomen Pelvis W Contrast 08/21/2011  IMPRESSION:  1.  Findings consistent with proctocolitis.  No evidence of abscess. 2.  4 cm uterine fibroid. 3.  Sigmoid  diverticulosis.  No radiographic evidence of diverticulitis. 4.  Mild asymmetric lower lobe infiltrate; pneumonia cannot be excluded.  Recommend clinical correlation and follow-up by chest radiograph.   Dg Chest Port 1 View 08/22/2011   IMPRESSION: Significant change in appearance of the chest with extensive pneumonia now present throughout the right lung.  This could be on the basis of aspiration.     Medications: Scheduled Meds:   . ipratropium  0.5 mg Nebulization STAT   And  . albuterol  2.5 mg Nebulization STAT  . albuterol      . azithromycin  500 mg Intravenous Q24H  . carbamazepine  200 mg Oral BID  . cefTRIAXone (ROCEPHIN)  IV  1 g Intravenous Q24H  . Chlorhexidine Gluconate Cloth  6 each Topical Q0600  . diltiazem  5 mg Intravenous Once  . estradiol  2 g Vaginal 3 times weekly  . furosemide  20 mg Intravenous Once  . furosemide  20 mg Intravenous Q12H  . ipratropium      . ipratropium  0.5 mg Nebulization Q4H  .  lamoTRIgine  25 mg  Oral BID  . lisinopril  40 mg Oral Daily  . LORazepam  1 mg Oral QHS  . magnesium sulfate infusion  2 g Intravenous Once  . metoprolol  50 mg Oral BID  . metroNIDAZOLE  500 mg Oral Q8H  . mupirocin ointment  1 application Nasal BID  . potassium chloride  40 mEq Oral Q4H  . warfarin  1 mg Oral ONCE-1800  . warfarin  1 mg Oral ONCE-1800  . DISCONTD: sodium chloride   Intravenous Once  . DISCONTD: amLODipine  10 mg Oral Daily  . DISCONTD: cefTRIAXone (ROCEPHIN)  IV  1 g Intravenous Q24H  . DISCONTD: cholecalciferol  4,000 Units Oral Daily  . DISCONTD: diltiazem  5 mg Intravenous Once  . DISCONTD: diltiazem  10 mg Intravenous Q6H  . DISCONTD: diltiazem  10 mg Intravenous Q6H  . DISCONTD: ipratropium  0.5 mg Nebulization Q6H  . DISCONTD: methylPREDNISolone (SOLU-MEDROL) injection  40 mg Intravenous Q12H  . DISCONTD: pantoprazole  40 mg Oral Daily   Continuous Infusions:   . sodium chloride 10 mL/hr at 08/22/11 0524  . diltiazem (CARDIZEM) infusion 5 mg/hr (08/22/11 0423)  . DISCONTD: 0.9 % NaCl with KCl 20 mEq / L 125 mL/hr at 08/22/11 0131  . DISCONTD: diltiazem (CARDIZEM) infusion     PRN Meds:.acetaminophen, cloNIDine, iohexol, labetalol, levalbuterol, ondansetron (ZOFRAN) IV, ondansetron, DISCONTD: acetaminophen, DISCONTD: albuterol, DISCONTD: cloNIDine HCl, DISCONTD: ipratropium, DISCONTD: labetalol  Assessment/Plan:  Principal Problem:  *PNA (pneumonia) - per above's CXR findings consistent with extensive PNA and likely aspiration - continue current antibiotics: Zithromax and Rocephin - pt maintaining good oxygen saturations on BiPAP  Active Problems:  HYPERTENSION - currently BP on soft side - will decrease the dose of lisinopril for now and will readjust the regimen as indicated   FIBRILLATION, ATRIAL - currently rate controlled - continue Cardizem and coumadin   GERD (gastroesophageal reflux disease) - well controlled   Diarrhea of infectious origin - secondary  to proctocolitis, C. Diff positive - continue Flagyl at current dosing   Hypokalemia - currently stable and within normal limits   Hypomagnesemia - replete as indicated, pt is currently getting Mg IV and will give additional 2 gm today by IV as well - check Mg level in AM   Proctocolitis - Gi consulted - continue Flagyl and supportive care   Hyperglycemia - I do not see actual diagnosis of DM and pt not able to verbalize and provide history - will check A1C and start SSI, sensitive coverage for now since pt is still NPO    LOS: 2 days   MAGICK-Katai Marsico 08/22/2011, 10:29 AM

## 2011-08-22 NOTE — Progress Notes (Signed)
Report was called to 2900. Pt SaO2 98% on non-breather during transport. Kalman Drape

## 2011-08-22 NOTE — Telephone Encounter (Signed)
Spoke with pts husband and let him know the GES results and that Dr. Arlyce Dice wanted to do an EGD. Pts husband states she was admitted to the hospital Monday night for CDIFF and that now she is on the step-down unit and also has pneumonia. Pts husband wants to know if the cdiff could have been causing the reflux and if the EGD needs to be done can it be done while she is in the hospital once she gets better. Dr. Arlyce Dice please advise.

## 2011-08-22 NOTE — Progress Notes (Signed)
ANTICOAGULATION CONSULT NOTE - Follow Up Consult  Pharmacy Consult for Warfarin  Indication: Afib and hx/o CVA  Patient Measurements: Height: 5\' 6"  (167.6 cm) Weight: 169 lb 12.1 oz (77 kg) IBW/kg (Calculated) : 59.3   Vital Signs: Temp: 97.3 F (36.3 C) (11/28 0833) Temp src: Axillary (11/28 0833) BP: 119/63 mmHg (11/28 0833) Pulse Rate: 77  (11/28 0833)  Labs:  Basename 08/22/11 0258 08/22/11 0235 08/21/11 0520 08/20/11 1451 08/20/11 1432  HGB -- 13.1 11.7* -- --  HCT -- 39.0 35.0* 37.4 --  PLT -- 225 197 186 --  APTT -- -- -- -- --  LABPROT 24.7* -- 23.5* -- 20.7*  INR 2.19* -- 2.05* -- 1.74*  HEPARINUNFRC -- -- -- -- --  CREATININE -- 0.86 0.99 0.90 --  CKTOTAL -- -- -- -- --  CKMB -- -- -- -- --  TROPONINI -- -- -- -- --   Estimated Creatinine Clearance: 61.1 ml/min (by C-G formula based on Cr of 0.86).  Warfarin History: PTA: 2 mg daily EXCEPT for 1 mg on MWF  Assessment: 75 y.o. F on warfarin for Afib with a therapeutic INR this a.m. Patient noted with C. Diff on flagyl and interaction with coumadin is anticipated.  Goal of Therapy: INR 2-3   Plan:  1. Warfarin 1 mg x 1 dose at 1800 today. 2. Will continue to monitor for any signs/symptoms of bleeding and will follow up with PT/INR in the a.m.   Benny Lennert 08/22/2011,10:04 AM

## 2011-08-22 NOTE — Progress Notes (Signed)
Name: [name] MRN: [id number] DOB: 10/03/1935  DOS: 08/22/2011    LOS: 2  CRITICAL CARE FOLLOW UP NOTE  Brief Hx - 75 y/o admitted for pseudomembranous colitis transferred to SDU for hypoxia, requiring NRB and BiPAP.   Lines / Drains: None  Cultures / Sepsis markers: 11/27  BC>>> 11/28  MRSA PCR>>>pos 11/27  Stool C>>> 11/26  UC>>> 11/27  C.diff PCR>>>pos  Antibiotics: 11/28  Azithromycin (CAP)>>> 11/28  Rocephin (CAP)>>> 11/27  Flagyl (C.dif)>>>  Tests / Events: 11/28  Transferred to SDU with Afib/RVR / respiratory distress requiring BiPAP 11/28  TTE>>>  Subjective/ Overnight: -  Feels much better with bipap.   Vital Signs:  Filed Vitals:   08/22/11 0700 08/22/11 0800 08/22/11 0833 08/22/11 0856  BP: 90/52 119/63 119/63   Pulse: 83 85 77   Temp:   97.3 F (36.3 C)   TempSrc:   Axillary   Resp: 23 20 20    Height:      Weight:      SpO2: 98% 97% 97% 97%     Physical Examination: Neuro:  Sleepy, but wakes up and follows commands, somewhat confused HEENT:  BiPAP mask, mm dry  Heart:  Irregular Lungs:  Diminished R, Few scattered ronchi L, Resps even non labored on bipap Abdomen:  Soft, non tender, bowel sounds present Extremities: Trace edema  Labs and Imaging:  Reviewed.  Please refer to the Assessment and Plan section for relevant results.  Assessment and Plan: Acute hypoxemic respiratory failure secondary to fluid overload in the setting of atrial fibrillation and likely diastolic disfunction. Echo pending.  Lab 08/22/11 0602 08/22/11 0230  PHART 7.384 7.443*  PCO2ART 36.1 31.6*  PO2ART 137.0* 58.7*  -->d/c IVF -->cont Lasix 20 mg IV BID x 48 h -->BiPAP - will trial off bipap intermitt -->no steroids for now -->f/u CXR   Mild asymmetric LL infiltrate, ? Pneumonia  Lab 08/22/11 0235 08/21/11 0520 08/20/11 1451  WBC 20.9* 18.4* 16.2*  -->Cont empirical Azithromycin and Ceftriaxone -->will taper ABx rapidly if clinically stable, given  pseudomembranous colitis  Atrial fibrillation with RVR, on Coumadin  Lab 08/22/11 0258 08/21/11 0520 08/20/11 1432  INR 2.19* 2.05* 1.74*  -->Cardizem gtt -->Coumadin per pharmacy  Hypomagnesemia -->replace PRN  Pseudomembranous colitis -->Flagyl, per primary   Hypertension -- cont  Metoprolol, prn clonidine  Seizure disorder -->Lamictal, per primary   Best practices / Disposition: -->SDU status under TRH -->PCCM consulting -->full code -->DVT Px is not indicated (on Coumadin) -->GI Px is not indicated -->NPO   [facility]  08/22/2011, 11:48 AM  Patient seen and examined, agree with above note.  I dictated the care and orders written for this patient under my direction.  Koren Bound, M.D.

## 2011-08-22 NOTE — Progress Notes (Addendum)
At 0045 pt found to have nasal cannula off, SaO2 85%, wheezing, and abdominal breathing. Pt achieved SaO2 88% 5LNC.  Venturi mask applied and MD called. Pt achieved SaO2 88% on venturi mask. Administered breathing tx, lasix, and prednisone stat per order. At 0140 pt SaO2 91% venturi mask with decreased wheezing and decreased abdominal breathing. Will continue to monitor. Kalman Drape RN

## 2011-08-22 NOTE — Telephone Encounter (Signed)
Spoke with pts husband and he is aware, he will call us if she is still having problems with nausea once she is better.

## 2011-08-22 NOTE — Progress Notes (Addendum)
Called by nursing, patient wheezing and hypoxic. Patient with no h/o COPD or tobacco use. Nebulizers and steriod ordered. Intial CXR (08/20/11) clear, CT abd/pelvis done today read as mildly suspicious changes at lung bases. A repeat CXR has been ordered, awaiting result. Patient w A fib and h/o severe AS, no recent echo results found. Single does lasix given. Antibiotic not started, await result of CXR especially as patient with C diff.    CT abd/pelvis: 08/21/2011 *RADIOLOGY REPORT* Clinical Data: Fever. Diarrhea. Abdominal pain. CT ABDOMEN AND PELVIS WITH CONTRAST Technique: Multidetector CT imaging of the abdomen and pelvis was performed following the standard protocol during bolus administration of intravenous contrast. Contrast: OMNIPAQUE IOHEXOL 300 MG/ML IV SOLN Comparison: None. Findings: Images through the lung bases show mild right lower lobe infiltrate, suspicious for pneumonia. A tiny less than 1 cm right hepatic lobe cyst is noted but no liver masses are identified. Gallbladder is unremarkable. The pancreas, spleen, and adrenal glands are normal in appearance. Tiny renal cysts are noted bilaterally but there is no evidence of renal mass or hydronephrosis. A Foley catheter is seen within the bladder which is collapsed. A subserosal fibroid is seen in the right lower uterine body which measures 4 cm. Shotty bilateral iliac lymph nodes are seen within the pelvis, none of which are pathologically enlarged. Wall thickening is seen involving the inferior rectum and anus, and there is soft tissue stranding seen within the presacral fat and the ischiorectal fossae bilaterally. This is suspicious for proctocolitis. No abscess identified. Sigmoid diverticulosis is noted, however there is no evidence of diverticulitis. IMPRESSION: 1. Findings consistent with proctocolitis. No evidence of abscess. 2. 4 cm uterine fibroid. 3. Sigmoid diverticulosis. No radiographic evidence of diverticulitis. 4. Mild  asymmetric lower lobe infiltrate; pneumonia cannot be excluded. Recommend clinical correlation and follow-up by chest radiograph.     Adddendum IMPRESSION: (08/22/11) CXR: Significant change in appearance of the chest with extensive  pneumonia now present throughout the right lung. This could be on  the basis of aspiration.  Original Report Authenticated By: Reola Calkins, M.D.  178/98, 114, 26, afebrile, 80-91% now on NR  Patient started on antibiotics, sputum culture, RN swallow evaluation. Patient now on a NR at 50% sating 90%, some use of abdominal muscles in respiration. Will transfer to step down. NPO ordered for now Patient still wheezing. A&O  A/P: Acute respiratory Failure/hypoxia -pneumonia/conncern for aspiration -patient will be transferred to stepdown -cont nebs, steroids, antibiotics -r/o aspiration, patient NPO, swallow evaluation, IVF -NR, will re-assess -ABG ordered -CCM asked to assess patient HTN uncontrolled -cardizem A fib -change norvasc to cardizem, patient will likely go into RVR -PRN labetalol   Time in: 1:05A Time out:1:20A  Time in: 2:00A Time out: 2:30A

## 2011-08-22 NOTE — Progress Notes (Signed)
eLink Physician-Brief Progress Note Patient Name: [name] DOB: 04/07/1936 MRN: [id number]  Date of Service  08/22/2011   HPI/Events of Note   53 F with C diff transferred to step down for resp distress now with low stats  Placed on NRB with low sats.  WOB is fair  ABG shows pO2 of 58  eICU Interventions  Plan: Start BiPAP To be seen by PCCM ABG in 2 hours post BiPAP   Intervention Category Intermediate Interventions: Respiratory distress - evaluation and management  Siera Beyersdorf 08/22/2011, 3:32 AM

## 2011-08-22 NOTE — Consult Note (Signed)
Name: [name] MRN: [id number] DOB: October 08, 1935  DOS: 08/22/2011    LOS: 2  CRITICAL CARE CONSULTATION NOTE  History of Present Illness: 75 y/o admitted for pseudomembranous colitis transferred to SDU for hypoxia, requiring NRB and BiPAP. At the time of my exam denies any dyspnea or chest pain.   Lines / Drains: None  Cultures / Sepsis markers: 11/27  BC>>> 11/28  MRSA PCR>>>pos 11/27  Stool C>>> 11/26  UC>>> 11/27  C.diff PCR>>>pos  Antibiotics: 11/28  Azithromycin (CAP)>>> 11/28  Rocephin (CAP)>>> 11/27  Flagyl (C.dif)>>>  Tests / Events: 11/28  Transferred to SDU with Afib/RVR / respiratory distress requiring BiPAP 11/28  TTE>>>  The patient is on BiPAP and unable to provide history, which was obtained for available medical records.    Past Medical History  Diagnosis Date  . Depressive disorder, not elsewhere classified   . Atrial fibrillation   . Unspecified transient cerebral ischemia   . Unspecified cerebral artery occlusion with cerebral infarction   . Unspecified essential hypertension   . Diverticulosis of colon (without mention of hemorrhage)   . Stricture and stenosis of esophagus   . Seizure disorder   . GERD (gastroesophageal reflux disease)   . IBS (irritable bowel syndrome)     Past Surgical History  Procedure Date  . Hemorrhoid surgery   . Benign braintumor     Removal    Prior to Admission medications   Medication Sig Start Date End Date Taking? Authorizing Provider  amLODipine (NORVASC) 10 MG tablet Take 10 mg by mouth daily.    Yes Historical Provider, MD  carbamazepine (TEGRETOL) 200 MG tablet Take 200 mg by mouth 2 (two) times daily.     Yes Historical Provider, MD  cholecalciferol (VITAMIN D) 1000 UNITS tablet Take 4,000 Units by mouth daily.     Yes Historical Provider, MD  conjugated estrogens (PREMARIN) vaginal cream Place 30 g vaginally daily.     Yes Historical Provider, MD  estradiol (ESTRACE) 0.1 MG/GM vaginal cream Place 2 g  vaginally 3 (three) times a week.     Yes Historical Provider, MD  hyoscyamine (LEVSIN SL) 0.125 MG SL tablet Place 0.125 mg under the tongue every 4 (four) hours as needed. For stomach cramps    Yes Historical Provider, MD  lamoTRIgine (LAMICTAL) 25 MG tablet Take 25 mg by mouth 2 (two) times daily.     Yes Historical Provider, MD  lisinopril (PRINIVIL,ZESTRIL) 40 MG tablet Take 40 mg by mouth daily.    Yes Historical Provider, MD  LORazepam (ATIVAN) 1 MG tablet Take 1 mg by mouth at bedtime.   06/19/11 06/18/12 Yes Louis Meckel, MD  metoprolol (LOPRESSOR) 50 MG tablet Take 50 mg by mouth 2 (two) times daily.    Yes Historical Provider, MD  ondansetron (ZOFRAN) 4 MG tablet Take 4 mg by mouth at bedtime.    Yes Historical Provider, MD  pantoprazole (PROTONIX) 40 MG tablet Take 40 mg by mouth daily.   08/01/11 07/31/12 Yes Louis Meckel, MD  saccharomyces boulardii (FLORASTOR) 250 MG capsule Take 250 mg by mouth 2 (two) times daily.     Yes Historical Provider, MD  warfarin (COUMADIN) 2 MG tablet Take 2 mg by mouth as directed. Takes( 1/2 tablet ) 1mg  on  Mon Wed and Friday then (1 tablet) 2mg  on Tues Thurs Sat and Sunday   Yes Historical Provider, MD   Allergies  Allergen Reactions  . Ciprofloxacin     REACTION: diarrhea/yeast infection  .  Penicillins Hives   Family History  Problem Relation Age of Onset  . Heart disease Mother   . Colon cancer Neg Hx    Social History  reports that she has never smoked. She has never used smokeless tobacco. She reports that she does not drink alcohol or use illicit drugs.  Review Of Systems  11 points review of systems is negative with an exception of listed in HPI.  Vital Signs:  Reviewed  Physical Examination: Neuro:  Sleepy, but wakes up and follows commands, somewhat confused HEENT:  BiPAP mask Heart:  Irregular Lungs:  Bilateral diminished air entry, basal rales Abdomen:  Soft, non tender, bowel sounds present Extremities: Trace  edema  Labs and Imaging:  Reviewed.  Please refer to the Assessment and Plan section for relevant results.  Assessment and Plan: Acute hypoxemic respiratory failure secondary to fluid overload on the background of atrial fibrillation and likely diastolic disfunction.  Lab 08/22/11 0230  PHART 7.443*  PCO2ART 31.6*  PO2ART 58.7*  -->d/c IVF -->Lasix 20 mg IV BID x 48 h -->BiPAP -->f/u ABG -->will d/c steroids as no history of obstructive airway disease and wheezing is likely due to "cardiac asthma"-->TTE  Mild asymmetric LL infiltrate, ? Pneumonia  Lab 08/22/11 0235 08/21/11 0520 08/20/11 1451  WBC 20.9* 18.4* 16.2*  -->empirical Azithromycin and Ceftriaxone -->will taper ABx rapidly if clinically stable, given pseudomembranous colitis  Atrial fibrillation with RVR, on Coumadin  Lab 08/22/11 0258 08/21/11 0520 08/20/11 1432  INR 2.19* 2.05* 1.74*  -->Cardizem gtt -->Coumadin per pharmacy  Hypomagnesemia -->replace  Pseudomembranous colitis -->Flagyl  Hypertension -->continue Lisinopril, Metoprolol, Clonidine  Seizure disorder -->Lamictal  Best practices / Disposition: -->SDU status under TRH -->PCCM consulting -->full code -->DVT Px is not indicated (on Coumadin) -->GI Px is not indicated -->NPO  Orlean Bradford, M.D. Pulmonary and Critical Care Medicine [facility] Cell: [phone]  08/22/2011, 5:32 AM

## 2011-08-22 NOTE — Telephone Encounter (Signed)
The 2 are probably unrelated. Once she is better from her pseudomembranous colitis, if she still has nausea  we can do the procedure in the hospital

## 2011-08-22 NOTE — Progress Notes (Signed)
*  PRELIMINARY RESULTS* Echocardiogram 2D Echocardiogram has been performed.  Clide Deutscher RDCS 08/22/2011, 3:44 PM

## 2011-08-23 ENCOUNTER — Inpatient Hospital Stay (HOSPITAL_COMMUNITY): Payer: Medicare Other

## 2011-08-23 DIAGNOSIS — J189 Pneumonia, unspecified organism: Secondary | ICD-10-CM

## 2011-08-23 DIAGNOSIS — I5031 Acute diastolic (congestive) heart failure: Secondary | ICD-10-CM | POA: Diagnosis not present

## 2011-08-23 DIAGNOSIS — J9600 Acute respiratory failure, unspecified whether with hypoxia or hypercapnia: Secondary | ICD-10-CM

## 2011-08-23 DIAGNOSIS — I509 Heart failure, unspecified: Secondary | ICD-10-CM

## 2011-08-23 DIAGNOSIS — A0472 Enterocolitis due to Clostridium difficile, not specified as recurrent: Secondary | ICD-10-CM

## 2011-08-23 LAB — GLUCOSE, CAPILLARY
Glucose-Capillary: 142 mg/dL — ABNORMAL HIGH (ref 70–99)
Glucose-Capillary: 100 mg/dL — ABNORMAL HIGH (ref 70–99)
Glucose-Capillary: 150 mg/dL — ABNORMAL HIGH (ref 70–99)
Glucose-Capillary: 123 mg/dL — ABNORMAL HIGH (ref 70–99)

## 2011-08-23 LAB — CBC
HCT: 35.1 % — ABNORMAL LOW (ref 36.0–46.0)
MCHC: 33.3 g/dL (ref 30.0–36.0)
MCV: 95.9 fL (ref 78.0–100.0)
RDW: 14.1 % (ref 11.5–15.5)

## 2011-08-23 LAB — BASIC METABOLIC PANEL
BUN: 19 mg/dL (ref 6–23)
Calcium: 8.7 mg/dL (ref 8.4–10.5)
Creatinine, Ser: 0.99 mg/dL (ref 0.50–1.10)
GFR calc Af Amer: 64 mL/min — ABNORMAL LOW (ref >90)
GFR calc non Af Amer: 55 mL/min — ABNORMAL LOW (ref >90)

## 2011-08-23 LAB — PROTIME-INR: INR: 2.83 — ABNORMAL HIGH (ref 0.00–1.49)

## 2011-08-23 MED ORDER — FUROSEMIDE 10 MG/ML IJ SOLN
40.0000 mg | Freq: Three times a day (TID) | INTRAMUSCULAR | Status: DC
  Administered 2011-08-23 – 2011-08-24 (×3): 40 mg via INTRAVENOUS
  Filled 2011-08-23 (×5): qty 4

## 2011-08-23 MED ORDER — POTASSIUM CHLORIDE 20 MEQ/15ML (10%) PO LIQD
40.0000 meq | Freq: Three times a day (TID) | ORAL | Status: DC
  Filled 2011-08-23 (×2): qty 30

## 2011-08-23 MED ORDER — POTASSIUM CHLORIDE CRYS ER 20 MEQ PO TBCR
40.0000 meq | EXTENDED_RELEASE_TABLET | Freq: Three times a day (TID) | ORAL | Status: AC
  Administered 2011-08-23 (×2): 40 meq via ORAL
  Filled 2011-08-23 (×2): qty 4

## 2011-08-23 MED ORDER — WARFARIN SODIUM 1 MG PO TABS
1.0000 mg | ORAL_TABLET | Freq: Once | ORAL | Status: AC
  Administered 2011-08-23: 1 mg via ORAL
  Filled 2011-08-23: qty 1

## 2011-08-23 MED ORDER — FUROSEMIDE 10 MG/ML IJ SOLN: 40.0000 mg | Freq: Three times a day (TID) | INTRAMUSCULAR | Status: DC

## 2011-08-23 MED ORDER — FUROSEMIDE 10 MG/ML IJ SOLN: 40.0000 mg | Freq: Two times a day (BID) | INTRAMUSCULAR | Status: DC

## 2011-08-23 MED ORDER — POTASSIUM CHLORIDE CRYS ER 20 MEQ PO TBCR: 40.0000 meq | EXTENDED_RELEASE_TABLET | Freq: Two times a day (BID) | ORAL | Status: DC

## 2011-08-23 MED ORDER — POTASSIUM CHLORIDE CRYS ER 20 MEQ PO TBCR: 40.0000 meq | EXTENDED_RELEASE_TABLET | Freq: Once | ORAL | Status: DC

## 2011-08-23 NOTE — Progress Notes (Signed)
Physical Therapy Evaluation Patient Details Name: [name] MRN: [id number] DOB: 01/02/1936 Today's Date: 08/23/2011  Problem List:  Patient Active Problem List  Diagnoses  . DEPRESSION  . HYPERTENSION  . FIBRILLATION, ATRIAL  . CEREBROVASCULAR ACCIDENT  . TRANSIENT ISCHEMIC ATTACK  . ESOPHAGEAL STRICTURE  . DIVERTICULOSIS, COLON  . FASCIITIS  . NAUSEA  . GERD (gastroesophageal reflux disease)  . Anxiety  . Chronic anticoagulation  . Diarrhea of infectious origin  . Hypokalemia  . Hypomagnesemia  . PNA (pneumonia)  . Proctocolitis  . Hyperglycemia  . Acute respiratory failure  . Diastolic CHF, acute    Past Medical History:  Past Medical History  Diagnosis Date  . Depressive disorder, not elsewhere classified   . Atrial fibrillation   . Unspecified transient cerebral ischemia   . Unspecified cerebral artery occlusion with cerebral infarction   . Unspecified essential hypertension   . Diverticulosis of colon (without mention of hemorrhage)   . Stricture and stenosis of esophagus   . Seizure disorder   . GERD (gastroesophageal reflux disease)   . IBS (irritable bowel syndrome)    Past Surgical History:  Past Surgical History  Procedure Date  . Hemorrhoid surgery   . Benign braintumor     Removal    PT Assessment/Plan/Recommendation PT Assessment Clinical Impression Statement: pt is a 75 y/o pt that was admitted with pna. She will benefit from PT on acute for general weakness gait instability, decr balance and activity tolerance  Likely home health PT for the same if pt's husband is able to handle her at home PT Recommendation/Assessment: Patient will need skilled PT in the acute care venue PT Problem List: Decreased strength;Decreased activity tolerance;Decreased balance;Decreased mobility;Decreased knowledge of use of DME Barriers to Discharge: Other (comment) (not sure if husband is able to assist pt (husband no present) PT Therapy Diagnosis :  Generalized weakness PT Plan PT Frequency: Min 3X/week PT Treatment/Interventions: DME instruction;Gait training;Stair training;Functional mobility training;Balance training;Patient/family education PT Recommendation Follow Up Recommendations: Home health PT Equipment Recommended: Other (comment) (TBD) PT Goals  Acute Rehab PT Goals PT Goal Formulation: With patient Time For Goal Achievement: 2 weeks Pt will go Supine/Side to Sit: with supervision PT Goal: Supine/Side to Sit - Progress: Other (comment) Pt will Transfer Sit to Stand/Stand to Sit: with supervision PT Transfer Goal: Sit to Stand/Stand to Sit - Progress: Other (comment) Pt will Transfer Bed to Chair/Chair to Bed: with supervision PT Transfer Goal: Bed to Chair/Chair to Bed - Progress: Other (comment) Pt will Ambulate: 51 - 150 feet;with least restrictive assistive device;Other (comment) (min guard A) PT Goal: Ambulate - Progress: Other (comment) Pt will Go Up / Down Stairs: 1-2 stairs;with supervision;with rail(s) PT Goal: Up/Down Stairs - Progress: Other (comment)  PT Evaluation Precautions/Restrictions  Precautions Precautions: Fall Required Braces or Orthoses: No Restrictions Weight Bearing Restrictions: No Prior Functioning  Home Living Lives With: Spouse Receives Help From: Family Type of Home: House Home Layout: One level Home Access: Stairs to enter Entrance Stairs-Rails: Left Entrance Stairs-Number of Steps: 2 Bathroom Shower/Tub: Engineer, manufacturing systems: Standard Home Adaptive Equipment: Bedside commode/3-in-1;Walker - rolling Prior Function Level of Independence: Independent with gait;Independent with transfers Able to Take Stairs?: Yes Cognition Cognition Arousal/Alertness: Awake/alert Overall Cognitive Status: Difficult to assess (slowed responses vs mild aphasia) Difficult to assess due to: other (comment) (confusion vs mild aphasia) Sensation/Coordination Coordination Gross Motor  Movements are Fluid and Coordinated: Yes Extremity Assessment RLE Assessment RLE Assessment: Exceptions to [facility] RLE Strength RLE  Overall Strength: Deficits;Other (Comment) (overall strength grossly 3+/5) RLE Overall Strength Comments: mild HP LLE Assessment LLE Assessment: Exceptions to [facility] LLE Strength LLE Overall Strength: Deficits;Other (Comment) (grossly >3+/5) Mobility (including Balance) Bed Mobility Bed Mobility: Yes Supine to Sit: 4: Min assist;Other (comment) (via R UE) Supine to Sit Details (indicate cue type and reason): vc's for technique; manual A for coming forward Transfers Transfers: Yes Sit to Stand: 4: Min assist;With upper extremity assist;From bed Sit to Stand Details (indicate cue type and reason): vc's for hand placement; manual A for forward w/shift Ambulation/Gait Ambulation/Gait: Yes Ambulation/Gait Assistance: 3: Mod assist;Patient percentage (comment) (pt=50%) Ambulation/Gait Assistance Details (indicate cue type and reason): neede A for maneuvering RW and for truncal control Ambulation Distance (Feet): 12 Feet Assistive device: Rolling walker Gait Pattern: Step-to pattern;Decreased step length - right;Shuffle (mildly HP gait)  Balance Balance Assessed: No (unsteady through out the gait trial) Exercise    End of Session PT - End of Session Activity Tolerance: Patient limited by fatigue;Other (comment) (pt experienced a syncopal episode and was placed back in the) Patient left: in bed;with call bell in reach Nurse Communication: Mobility status for ambulation;Mobility status for transfers General Behavior During Session: [facility] for tasks performed  Freddy Spadafora, Eliseo Gum 08/23/2011, 12:45 PM  08/23/2011  [place] Bing, PT [phone] (pager)

## 2011-08-23 NOTE — Progress Notes (Signed)
Name: ASPASIA RUDE MRN: [id number] DOB: 02/18/1936  DOS: 08/22/2011    LOS: 3  CRITICAL CARE FOLLOW UP NOTE  Brief Hx - 75 y/o admitted for pseudomembranous colitis transferred to SDU for hypoxia, requiring NRB and BiPAP.   Lines / Drains: None  Cultures / Sepsis markers: 11/27  BC>>> 11/28  MRSA PCR>>>pos 11/27  Stool C>>> 11/26  UC>>> 11/27  C.diff PCR>>>pos  Antibiotics: 11/28  Azithromycin (CAP)>>> 11/28  Rocephin (CAP)>>> 11/27  Flagyl (C.dif)>>>  Tests / Events: 11/28  Transferred to SDU with Afib/RVR / respiratory distress requiring BiPAP 11/28  TTE>>>  Subjective/ Overnight: -  Feels much better with bipap.   Vital Signs:  Filed Vitals:   08/23/11 0800 08/23/11 0833 08/23/11 0834 08/23/11 1003  BP: 158/61   165/52  Pulse: 105   121  Temp:   98.3 F (36.8 C)   TempSrc:   Axillary   Resp: 25     Height:      Weight:      SpO2: 94% 95%      Intake/Output Summary (Last 24 hours) at 08/23/11 1126 Last data filed at 08/23/11 1000  Gross per 24 hour  Intake   1513 ml  Output   1260 ml  Net    253 ml     Physical Examination: Neuro:  Sleepy, but wakes up and follows commands, somewhat confused HEENT:  BiPAP mask, mm dry  Heart:  Irregular Lungs:  Diminished R, Few scattered ronchi L, Resps even non labored on bipap Abdomen:  Soft, non tender, bowel sounds present Extremities: Trace edema  Labs and Imaging:   BMET    Component Value Date/Time   NA 140 08/23/2011 0620   K 3.2* 08/23/2011 0620   CL 103 08/23/2011 0620   CO2 25 08/23/2011 0620   GLUCOSE 108* 08/23/2011 0620   BUN 19 08/23/2011 0620   CREATININE 0.99 08/23/2011 0620   CALCIUM 8.7 08/23/2011 0620   GFRNONAA 55* 08/23/2011 0620   GFRAA 64* 08/23/2011 0620    CBC    Component Value Date/Time   WBC 19.6* 08/23/2011 0620   RBC 3.66* 08/23/2011 0620   HGB 11.7* 08/23/2011 0620   HCT 35.1* 08/23/2011 0620   PLT 266 08/23/2011 0620   MCV 95.9 08/23/2011 0620   MCH 32.0  08/23/2011 0620   MCHC 33.3 08/23/2011 0620   RDW 14.1 08/23/2011 0620   LYMPHSABS 0.5* 08/20/2011 1451   MONOABS 1.6* 08/20/2011 1451   EOSABS 0.0 08/20/2011 1451   BASOSABS 0.0 08/20/2011 1451      Assessment and Plan: Acute hypoxemic respiratory failure secondary to fluid overload in the setting of atrial fibrillation and likely diastolic disfunction. Echo pending.   Lab 08/22/11 0602 08/22/11 0230  PHART 7.384 7.443*  PCO2ART 36.1 31.6*  PO2ART 137.0* 58.7*  -->d/c IVF. -->Increase lasix to 40 mg IV q8 hours x2 doses. -->Replace K. -->Used BiPAP overnight but now off and able to use [place]. -->no steroids for now. -->f/u CXR.   Mild asymmetric LL infiltrate, ? Pneumonia  Lab 08/23/11 0620 08/22/11 0235 08/21/11 0520 08/20/11 1451  WBC 19.6* 20.9* 18.4* 16.2*  -->Cont empirical Azithromycin and Ceftriaxone -->will taper ABx rapidly if clinically stable, given pseudomembranous colitis  Atrial fibrillation with RVR, on Coumadin  Lab 08/23/11 0620 08/22/11 0258 08/21/11 0520 08/20/11 1432  INR 2.83* 2.19* 2.05* 1.74*  -->Cardizem gtt>>>change to PO when more clinically stable. -->Coumadin per pharmacy  Hypomagnesemia -->replace PRN  Pseudomembranous colitis -->Flagyl, per primary  Hypertension -- cont  Metoprolol, prn clonidine  Seizure disorder -->Lamictal, per primary   Best practices / Disposition: -->SDU status under TRH -->PCCM consulting -->full code -->DVT Px is not indicated (on Coumadin) -->GI Px is not indicated -->Start diet per primary team.   Koren Bound, M.D.

## 2011-08-23 NOTE — Progress Notes (Addendum)
Patient ID: [name], female   DOB: 1936-07-15, 75 y.o.   MRN: [id number]  Subjective: No events overnight. Patient denies chest pain, shortness of breath, abdominal pain. She reports feeling better.  Objective:  Vital signs in last 24 hours:  Filed Vitals:   08/23/11 0600 08/23/11 0700 08/23/11 0833 08/23/11 0834  BP: 137/63 148/64    Pulse: 105 102    Temp:    98.3 F (36.8 C)  TempSrc:  Oral  Axillary  Resp: 18 24    Height:      Weight:      SpO2: 93% 93% 95%     Intake/Output from previous day:   Intake/Output Summary (Last 24 hours) at 08/23/11 0956 Last data filed at 08/23/11 0700  Gross per 24 hour  Intake   1468 ml  Output   1485 ml  Net    -17 ml    Physical Exam: General: Alert, awake, oriented to name and place, in no acute distress. HEENT: No bruits, no goiter. Moist mucous membranes, no scleral icterus, no conjunctival pallor. Heart: Irregular rate and rhythm, SEM 2/6 murmur, no rubs or gallops. Lungs: good air movement bilaterally with bibasilar crackles, no wheezing noted, no tachypnea Abdomen: Soft, nontender, nondistended, positive bowel sounds. Extremities: No clubbing cyanosis or edema,  positive pedal pulses. Neuro: Grossly intact, nonfocal.  Lab Results:  Basic Metabolic Panel:    Component Value Date/Time   NA 140 08/23/2011 0620   K 3.2* 08/23/2011 0620   CL 103 08/23/2011 0620   CO2 25 08/23/2011 0620   BUN 19 08/23/2011 0620   CREATININE 0.99 08/23/2011 0620   GLUCOSE 108* 08/23/2011 0620   CALCIUM 8.7 08/23/2011 0620   CBC:    Component Value Date/Time   WBC 19.6* 08/23/2011 0620   HGB 11.7* 08/23/2011 0620   HCT 35.1* 08/23/2011 0620   PLT 266 08/23/2011 0620   MCV 95.9 08/23/2011 0620   NEUTROABS 14.1* 08/20/2011 1451   LYMPHSABS 0.5* 08/20/2011 1451   MONOABS 1.6* 08/20/2011 1451   EOSABS 0.0 08/20/2011 1451   BASOSABS 0.0 08/20/2011 1451      Lab 08/23/11 0620 08/22/11 0235 08/21/11 0520 08/20/11 1451  WBC  19.6* 20.9* 18.4* 16.2*  HGB 11.7* 13.1 11.7* 12.6  HCT 35.1* 39.0 35.0* 37.4  PLT 266 225 197 186  MCV 95.9 97.3 97.2 95.7  MCH 32.0 32.7 32.5 32.2  MCHC 33.3 33.6 33.4 33.7  RDW 14.1 14.1 13.8 13.4  LYMPHSABS -- -- -- 0.5*  MONOABS -- -- -- 1.6*  EOSABS -- -- -- 0.0  BASOSABS -- -- -- 0.0  BANDABS -- -- -- --    Lab 08/23/11 0620 08/22/11 0235 08/21/11 0520 08/20/11 1451  NA 140 141 142 139  K 3.2* 4.3 3.3* 2.9*  CL 103 107 107 100  CO2 25 22 24 27   GLUCOSE 108* 168* 124* 146*  BUN 19 11 11 10   CREATININE 0.99 0.86 0.99 0.90  CALCIUM 8.7 8.9 8.8 9.2  MG 2.3 1.5 -- --    Lab 08/23/11 0620 08/22/11 0258 08/21/11 0520 08/20/11 1432  INR 2.83* 2.19* 2.05* 1.74*  PROTIME -- -- -- --   Cardiac markers: No results found for this basename: CK:3,CKMB:3,TROPONINI:3,MYOGLOBIN:3 in the last 168 hours  Lab 08/22/11 0238  POCBNP 2548.0*   Recent Results (from the past 240 hour(s))  URINE CULTURE     Status: Normal (Preliminary result)   Collection Time   08/20/11  3:23 PM  Component Value Range Status Comment   Specimen Description URINE, CLEAN CATCH   Final    Special Requests NONE   Final    Setup Time [id number]   Final    Colony Count 20,OOO COLONIES/ML   Final    Culture ENTEROCOCCUS SPECIES   Final    Report Status PENDING   Incomplete   CULTURE, BLOOD (ROUTINE X 2)     Status: Normal (Preliminary result)   Collection Time   08/21/11  1:00 AM      Component Value Range Status Comment   Specimen Description BLOOD LEFT ARM   Final    Special Requests BOTTLES DRAWN AEROBIC AND ANAEROBIC 10CC EA   Final    Setup Time [id number]   Final    Culture     Final    Value:        BLOOD CULTURE RECEIVED NO GROWTH TO DATE CULTURE WILL BE HELD FOR 5 DAYS BEFORE ISSUING A FINAL NEGATIVE REPORT   Report Status PENDING   Incomplete   CULTURE, BLOOD (ROUTINE X 2)     Status: Normal (Preliminary result)   Collection Time   08/21/11  1:15 AM      Component Value Range Status  Comment   Specimen Description BLOOD LEFT WRIST   Final    Special Requests BOTTLES DRAWN AEROBIC AND ANAEROBIC 10CC EA   Final    Setup Time [id number]   Final    Culture     Final    Value:        BLOOD CULTURE RECEIVED NO GROWTH TO DATE CULTURE WILL BE HELD FOR 5 DAYS BEFORE ISSUING A FINAL NEGATIVE REPORT   Report Status PENDING   Incomplete   CLOSTRIDIUM DIFFICILE BY PCR     Status: Abnormal   Collection Time   08/21/11  8:31 AM      Component Value Range Status Comment   C difficile by pcr POSITIVE (*) NEGATIVE  Final   STOOL CULTURE     Status: Normal (Preliminary result)   Collection Time   08/21/11  8:32 AM      Component Value Range Status Comment   Specimen Description STOOL   Final    Special Requests NONE   Final    Culture Culture reincubated for better growth   Final    Report Status PENDING   Incomplete   MRSA PCR SCREENING     Status: Abnormal   Collection Time   08/22/11  3:25 AM      Component Value Range Status Comment   MRSA by PCR POSITIVE (*) NEGATIVE  Final     Studies/Results:  Dg Chest Port 1 View 08/23/2011    IMPRESSION:  1.  Improved aeration of the right upper and mid lung which given rapid interval change is favored to represent improving pulmonary edema. 2.  Persistent opacities within the right lung base may represent residual asymmetric pulmonary edema, though underlying infection is not excluded. Continued attention on follow-up is recommended.    Dg Chest Port 1 View 08/22/2011    IMPRESSION: Significant change in appearance of the chest with extensive pneumonia now present throughout the right lung.  This could be on the basis of aspiration.    Ct Abdomen Pelvis W Contrast 08/21/2011  IMPRESSION:  1.  Findings consistent with proctocolitis.  No evidence of abscess. 2.  4 cm uterine fibroid. 3.  Sigmoid  diverticulosis.  No radiographic evidence of diverticulitis. 4.  Mild asymmetric lower  lobe infiltrate; pneumonia cannot be excluded.   Recommend clinical correlation and follow-up by chest radiograph.    Medications: Scheduled Meds:   . albuterol      . azithromycin  500 mg Intravenous Q24H  . carbamazepine  200 mg Oral BID  . cefTRIAXone (ROCEPHIN)  IV  1 g Intravenous Q24H  . Chlorhexidine Gluconate Cloth  6 each Topical Q0600  . estradiol  2 g Vaginal 3 times weekly  . furosemide  20 mg Intravenous Q12H  . insulin aspart  0-9 Units Subcutaneous Q4H  . ipratropium  0.5 mg Nebulization TID  . lamoTRIgine  25 mg Oral BID  . levalbuterol  1.25 mg Nebulization TID  . LORazepam  1 mg Oral QHS  . magnesium sulfate infusion  2 g Intravenous Once  . magnesium sulfate infusion  3 g Intravenous Once  . metoprolol  50 mg Oral BID  . metronidazole  500 mg Intravenous Q8H  . mupirocin ointment  1 application Nasal BID  . warfarin  1 mg Intravenous ONCE-1800  . DISCONTD: ipratropium  0.5 mg Nebulization Q4H  . DISCONTD: lisinopril  40 mg Oral Daily  . DISCONTD: metroNIDAZOLE  500 mg Oral Q8H  . DISCONTD: warfarin  1 mg Oral ONCE-1800   Continuous Infusions:   . sodium chloride 10 mL/hr at 08/23/11 0700  . diltiazem (CARDIZEM) infusion 15 mg/hr (08/23/11 0748)   PRN Meds:.acetaminophen, cloNIDine, labetalol, levalbuterol, ondansetron (ZOFRAN) IV, ondansetron, DISCONTD: levalbuterol  Assessment/Plan:  Principal Problem:  *PNA (pneumonia) - pt clinically improving and denies shortness of breath - she is maintaining good oxygen saturation on nasal canula and alone and is no respiratory distress - CXR suggestive of vascular congestion - pt stable to transfer to telemetry today  Active Problems:  HYPERTENSION - at goal for now   FIBRILLATION, ATRIAL - slightly tachycardic but has not received metoprolol this AM and has skipped one dose yesterday - no changes to medication regimen needed at this time, will continue to monitor vitals per floor protocol - continue metoprolol and coumadin   GERD (gastroesophageal  reflux disease) - controlled off PPI   Diarrhea of infectious origin - continue Flagyl   Hypokalemia - supplement - magnesium levels stable   Hypomagnesemia - within normal limit   Acute respiratory failure secondary to diastolic CHF - resolved   Disposition - PT/OT evaluation - transfer to telemetry    LOS: 3 days   MAGICK-Gracy Ehly 08/23/2011, 9:56 AM

## 2011-08-23 NOTE — Progress Notes (Signed)
Speech Language Pathology Bedside Swallow Evaluation Patient Details  Name: [name] MRN: [id number] DOB: [birth year] Today's Date: 08/23/2011  Past Medical History:  Past Medical History  Diagnosis Date  . Depressive disorder, not elsewhere classified   . Atrial fibrillation   . Unspecified transient cerebral ischemia   . Unspecified cerebral artery occlusion with cerebral infarction   . Unspecified essential hypertension   . Diverticulosis of colon (without mention of hemorrhage)   . Stricture and stenosis of esophagus   . Seizure disorder   . GERD (gastroesophageal reflux disease)   . IBS (irritable bowel syndrome)    Past Surgical History:  Past Surgical History  Procedure Date  . Hemorrhoid surgery   . Benign braintumor     Removal    Assessment/Recommendations/Treatment Plan    SLP Assessment Clinical Impression Statement: Pt exhibits no overt s/s aspiration at this time, however, RN reports coughing with po trials, and CXR raises suspicion for aspiration.  Objective study recommended to formally assess swallow function and safety and to identifiy least restrictive diet. Risk for Aspiration: Moderate Other Related Risk Factors: History of pneumonia;History of GERD;Previous CVA;Cognitive impairment;Decreased respiratory status  Recommendations Recommended Consults: MBS General Recommendation: NPO Solid Consistency: No solids, see liquids Liquid Consistency: None Medication Administration: Crushed with puree Supervision:  (pending MBS) Oral Care Recommendations: Oral care BID;Staff/trained caregiver to provide oral care Other Recommendations: Have oral suction available  Prognosis Prognosis for Safe Diet Advancement: Good Barriers to Reach Goals: Cognitive deficits  Individuals Consulted Consulted and Agree with Results and Recommendations: Patient;Family member/caregiver;RN Family Member Consulted: son  Swallow Study Goals  SLP Swallowing  Goals Patient will consume recommended diet without observed clinical signs of aspiration with: Modified independent assistance Swallow Study Goal #1 - Progress: Not Met Patient will utilize recommended strategies during swallow to increase swallowing safety with: Modified independent assistance Swallow Study Goal #2 - Progress: Not met  Swallow Study Prior Functional Status     General  Date of Onset: 08/20/11 Other Pertinent Information: RN reports coughing with po intake, CXR raises suspicion for aspiration. Type of Study: Bedside swallow evaluation Diet Prior to this Study: NPO Temperature Spikes Noted: No Respiratory Status: Supplemental O2 delivered via (comment) ([place]@3L ) History of Intubation: No Behavior/Cognition: Alert;Cooperative;Pleasant mood Oral Cavity - Dentition: Adequate natural dentition Vision: Functional for self-feeding Patient Positioning: Upright in bed Baseline Vocal Quality: Normal Volitional Cough: Strong Volitional Swallow: Able to elicit Ice chips: Tested (comment)  Oral Motor/Sensory Function  Labial ROM: Within Functional Limits Labial Symmetry: Within Functional Limits Labial Strength: Within Functional Limits Labial Sensation: Within Functional Limits Lingual ROM: Within Functional Limits Lingual Symmetry: Within Functional Limits Lingual Sensation: Within Functional Limits Facial ROM: Within Functional Limits Facial Symmetry: Within Functional Limits Facial Strength: Within Functional Limits Facial Sensation: Within Functional Limits Velum: Within Functional Limits Mandible: Within Functional Limits  Consistency Results  Ice Chips Ice chips: Within functional limits Presentation: Spoon  Thin Liquid Thin Liquid: Within functional limits Presentation: Straw;Cup;Self Fed (with hand over hand assist)  Nectar Thick Liquid Nectar Thick Liquid: Not tested  Honey Thick Liquid Honey Thick Liquid: Not tested  Puree Puree: Within  functional limits Presentation: Spoon  Solid Solid: Not tested  Suttyn Cryder B. Lyrah Bradt, MSP, CCC-SLP [phone] 08/23/2011,10:03 AM

## 2011-08-23 NOTE — Plan of Care (Signed)
Problem: Phase I Progression Outcomes Goal: Other Phase I Outcomes/Goals Outcome: Progressing Tolerate least restrictive diet without s/s aspiration.

## 2011-08-23 NOTE — Progress Notes (Signed)
ANTICOAGULATION CONSULT NOTE - Follow Up Consult  Pharmacy Consult for Warfarin  Indication: Afib and hx/o CVA  Patient Measurements: Height: 5\' 6"  (167.6 cm) Weight: 166 lb 7.2 oz (75.5 kg) (bed weight) IBW/kg (Calculated) : 59.3   Vital Signs: Temp: 98.3 F (36.8 C) (11/29 0834) Temp src: Axillary (11/29 0834) BP: 165/52 mmHg (11/29 1003) Pulse Rate: 121  (11/29 1003)  Labs:  Basename 08/23/11 0620 08/22/11 0258 08/22/11 0235 08/21/11 0520  HGB 11.7* -- 13.1 --  HCT 35.1* -- 39.0 35.0*  PLT 266 -- 225 197  APTT -- -- -- --  LABPROT 30.2* 24.7* -- 23.5*  INR 2.83* 2.19* -- 2.05*  HEPARINUNFRC -- -- -- --  CREATININE 0.99 -- 0.86 0.99  CKTOTAL -- -- -- --  CKMB -- -- -- --  TROPONINI -- -- -- --   Estimated Creatinine Clearance: 52.6 ml/min (by C-G formula based on Cr of 0.99).  Warfarin History: PTA: 2 mg daily EXCEPT for 1 mg on MWF  Assessment: 74 y.o. F on warfarin for Afib with a therapeutic INR this a.m. Patient noted with C. Diff on flagyl and interaction with coumadin is anticipated.  Goal of Therapy: INR 2-3   Plan:  1. Warfarin 1 mg x 1 dose at 1800 today. 2. Will continue to monitor for any signs/symptoms of bleeding and will follow up with PT/INR in the a.m.   Nadara Mustard Trout 08/23/2011,12:19 PM

## 2011-08-23 NOTE — Progress Notes (Signed)
Utilization Review Completed.[name] T11/29/2012   

## 2011-08-23 NOTE — Progress Notes (Signed)
Speech Language Pathology SLP Cancellation Note  Unable to complete MBS this afternoon due to recent episode of syncope.  Will reschedule MBS for next date (08/24/11).  RN aware.  Celia B. Bueche, MSP, CCC-SLP [phone]

## 2011-08-24 ENCOUNTER — Inpatient Hospital Stay (HOSPITAL_COMMUNITY): Payer: Medicare Other

## 2011-08-24 LAB — BASIC METABOLIC PANEL
BUN: 18 mg/dL (ref 6–23)
CO2: 26 mEq/L (ref 19–32)
Chloride: 103 mEq/L (ref 96–112)
GFR calc non Af Amer: 55 mL/min — ABNORMAL LOW (ref >90)
Glucose, Bld: 164 mg/dL — ABNORMAL HIGH (ref 70–99)
Potassium: 3 mEq/L — ABNORMAL LOW (ref 3.5–5.1)

## 2011-08-24 LAB — URINALYSIS, ROUTINE W REFLEX MICROSCOPIC
Bilirubin Urine: NEGATIVE
Glucose, UA: NEGATIVE mg/dL
Specific Gravity, Urine: 1.006 (ref 1.005–1.030)
pH: 7.5 (ref 5.0–8.0)

## 2011-08-24 LAB — CBC
HCT: 36.3 % (ref 36.0–46.0)
Hemoglobin: 12.1 g/dL (ref 12.0–15.0)
MCHC: 33.3 g/dL (ref 30.0–36.0)
RBC: 3.8 MIL/uL — ABNORMAL LOW (ref 3.87–5.11)

## 2011-08-24 LAB — GLUCOSE, CAPILLARY
Glucose-Capillary: 124 mg/dL — ABNORMAL HIGH (ref 70–99)
Glucose-Capillary: 164 mg/dL — ABNORMAL HIGH (ref 70–99)

## 2011-08-24 LAB — URINE CULTURE: Culture  Setup Time: 201211270732

## 2011-08-24 LAB — URINE MICROSCOPIC-ADD ON

## 2011-08-24 LAB — PROTIME-INR: INR: 3.23 — ABNORMAL HIGH (ref 0.00–1.49)

## 2011-08-24 MED ORDER — AZITHROMYCIN 250 MG PO TABS
250.0000 mg | ORAL_TABLET | Freq: Every day | ORAL | Status: DC
  Filled 2011-08-24: qty 1

## 2011-08-24 MED ORDER — DILTIAZEM HCL 30 MG PO TABS
30.0000 mg | ORAL_TABLET | Freq: Four times a day (QID) | ORAL | Status: DC
  Filled 2011-08-24 (×4): qty 1

## 2011-08-24 MED ORDER — POTASSIUM CHLORIDE 20 MEQ/15ML (10%) PO LIQD
40.0000 meq | Freq: Two times a day (BID) | ORAL | Status: DC
  Administered 2011-08-24: 40 meq via ORAL
  Filled 2011-08-24 (×2): qty 30

## 2011-08-24 MED ORDER — METOPROLOL TARTRATE 1 MG/ML IV SOLN
5.0000 mg | Freq: Four times a day (QID) | INTRAVENOUS | Status: DC
  Administered 2011-08-24 – 2011-08-26 (×7): 5 mg via INTRAVENOUS
  Filled 2011-08-24 (×10): qty 5

## 2011-08-24 MED ORDER — DEXTROSE 5 % IV SOLN
250.0000 mg | INTRAVENOUS | Status: DC
  Administered 2011-08-24 – 2011-08-26 (×3): 250 mg via INTRAVENOUS
  Filled 2011-08-24 (×7): qty 250

## 2011-08-24 MED ORDER — METRONIDAZOLE 500 MG PO TABS
500.0000 mg | ORAL_TABLET | Freq: Three times a day (TID) | ORAL | Status: DC
  Administered 2011-08-24: 500 mg via ORAL
  Filled 2011-08-24 (×4): qty 1

## 2011-08-24 MED ORDER — METRONIDAZOLE IN NACL 5-0.79 MG/ML-% IV SOLN
500.0000 mg | Freq: Three times a day (TID) | INTRAVENOUS | Status: DC
  Administered 2011-08-24 – 2011-08-31 (×21): 500 mg via INTRAVENOUS
  Filled 2011-08-24 (×29): qty 100

## 2011-08-24 MED ORDER — MAGIC MOUTHWASH
15.0000 mL | Freq: Three times a day (TID) | ORAL | Status: DC
  Administered 2011-08-24 – 2011-09-04 (×20): 15 mL via ORAL
  Filled 2011-08-24 (×36): qty 15

## 2011-08-24 MED ORDER — CEFTRIAXONE SODIUM 1 G IJ SOLR
1.0000 g | INTRAMUSCULAR | Status: DC
  Administered 2011-08-24 – 2011-08-27 (×4): 1 g via INTRAVENOUS
  Filled 2011-08-24 (×4): qty 10

## 2011-08-24 NOTE — Progress Notes (Signed)
Patient ID: [name], female   DOB: 10-Jul-1936, 75 y.o.   MRN: [id number]  Subjective: No events overnight. Son requests change in diet to soft, he is worried she is not getting enough nutrition.  Objective:  Vital signs in last 24 hours:  Filed Vitals:   08/23/11 2038 08/24/11 0408 08/24/11 0527 08/24/11 0542  BP: 160/91 153/86    Pulse: 112 103    Temp: 98 F (36.7 C) 99.1 F (37.3 C)    TempSrc: Oral Oral    Resp: 22 21    Height:      Weight:   75.6 kg (166 lb 10.7 oz)   SpO2: 92% 90%  92%    Intake/Output from previous day:   Intake/Output Summary (Last 24 hours) at 08/24/11 0811 Last data filed at 08/24/11 9147  Gross per 24 hour  Intake 1042.92 ml  Output   2625 ml  Net -1582.08 ml    Physical Exam: General: Alert, awake, oriented x3, in no acute distress. HEENT: No bruits, no goiter. Moist mucous membranes, no scleral icterus, no conjunctival pallor. Heart: Irregular rate and rhythm, without murmurs, rubs, gallops. Lungs: Clear to auscultation bilaterally with minimal bibasilar crackles, no wheezing or rhonchi Abdomen: Soft, nontender, nondistended, positive bowel sounds. Extremities: No clubbing cyanosis or edema,  positive pedal pulses. Neuro: Grossly intact, nonfocal.  Lab Results:  Basic Metabolic Panel:    Component Value Date/Time   NA 142 08/24/2011 0510   K 3.0* 08/24/2011 0510   CL 103 08/24/2011 0510   CO2 26 08/24/2011 0510   BUN 18 08/24/2011 0510   CREATININE 1.00 08/24/2011 0510   GLUCOSE 164* 08/24/2011 0510   CALCIUM 8.5 08/24/2011 0510   CBC:    Component Value Date/Time   WBC 14.7* 08/24/2011 0510   HGB 12.1 08/24/2011 0510   HCT 36.3 08/24/2011 0510   PLT 291 08/24/2011 0510   MCV 95.5 08/24/2011 0510   NEUTROABS 14.1* 08/20/2011 1451   LYMPHSABS 0.5* 08/20/2011 1451   MONOABS 1.6* 08/20/2011 1451   EOSABS 0.0 08/20/2011 1451   BASOSABS 0.0 08/20/2011 1451      Lab 08/24/11 0510 08/23/11 0620 08/22/11 0235  08/21/11 0520 08/20/11 1451  WBC 14.7* 19.6* 20.9* 18.4* 16.2*  HGB 12.1 11.7* 13.1 11.7* 12.6  HCT 36.3 35.1* 39.0 35.0* 37.4  PLT 291 266 225 197 186  MCV 95.5 95.9 97.3 97.2 95.7  MCH 31.8 32.0 32.7 32.5 32.2  MCHC 33.3 33.3 33.6 33.4 33.7  RDW 14.3 14.1 14.1 13.8 13.4  LYMPHSABS -- -- -- -- 0.5*  MONOABS -- -- -- -- 1.6*  EOSABS -- -- -- -- 0.0  BASOSABS -- -- -- -- 0.0  BANDABS -- -- -- -- --    Lab 08/24/11 0510 08/23/11 0620 08/22/11 0235 08/21/11 0520 08/20/11 1451  NA 142 140 141 142 139  K 3.0* 3.2* 4.3 3.3* 2.9*  CL 103 103 107 107 100  CO2 26 25 22 24 27   GLUCOSE 164* 108* 168* 124* 146*  BUN 18 19 11 11 10   CREATININE 1.00 0.99 0.86 0.99 0.90  CALCIUM 8.5 8.7 8.9 8.8 9.2  MG 1.6 2.3 1.5 -- --    Lab 08/24/11 0510 08/23/11 0620 08/22/11 0258 08/21/11 0520 08/20/11 1432  INR 3.23* 2.83* 2.19* 2.05* 1.74*  PROTIME -- -- -- -- --   Cardiac markers: No results found for this basename: CK:3,CKMB:3,TROPONINI:3,MYOGLOBIN:3 in the last 168 hours  Lab 08/22/11 0238  POCBNP 2548.0*   Recent  Results (from the past 240 hour(s))  URINE CULTURE     Status: Normal   Collection Time   08/20/11  3:23 PM      Component Value Range Status Comment   Specimen Description URINE, CLEAN CATCH   Final    Special Requests NONE   Final    Setup Time [id number]   Final    Colony Count 20,OOO COLONIES/ML   Final    Culture ENTEROCOCCUS SPECIES   Final    Report Status 08/24/2011 FINAL   Final    Organism ID, Bacteria ENTEROCOCCUS SPECIES   Final   CULTURE, BLOOD (ROUTINE X 2)     Status: Normal (Preliminary result)   Collection Time   08/21/11  1:00 AM      Component Value Range Status Comment   Specimen Description BLOOD LEFT ARM   Final    Special Requests BOTTLES DRAWN AEROBIC AND ANAEROBIC 10CC EA   Final    Setup Time [id number]   Final    Culture     Final    Value:        BLOOD CULTURE RECEIVED NO GROWTH TO DATE CULTURE WILL BE HELD FOR 5 DAYS BEFORE ISSUING A FINAL  NEGATIVE REPORT   Report Status PENDING   Incomplete   CULTURE, BLOOD (ROUTINE X 2)     Status: Normal (Preliminary result)   Collection Time   08/21/11  1:15 AM      Component Value Range Status Comment   Specimen Description BLOOD LEFT WRIST   Final    Special Requests BOTTLES DRAWN AEROBIC AND ANAEROBIC 10CC EA   Final    Setup Time [id number]   Final    Culture     Final    Value:        BLOOD CULTURE RECEIVED NO GROWTH TO DATE CULTURE WILL BE HELD FOR 5 DAYS BEFORE ISSUING A FINAL NEGATIVE REPORT   Report Status PENDING   Incomplete   CLOSTRIDIUM DIFFICILE BY PCR     Status: Abnormal   Collection Time   08/21/11  8:31 AM      Component Value Range Status Comment   C difficile by pcr POSITIVE (*) NEGATIVE  Final   STOOL CULTURE     Status: Normal (Preliminary result)   Collection Time   08/21/11  8:32 AM      Component Value Range Status Comment   Specimen Description STOOL   Final    Special Requests NONE   Final    Culture Culture reincubated for better growth   Final    Report Status PENDING   Incomplete   MRSA PCR SCREENING     Status: Abnormal   Collection Time   08/22/11  3:25 AM      Component Value Range Status Comment   MRSA by PCR POSITIVE (*) NEGATIVE  Final     Studies/Results: Dg Chest Port 1 View  08/23/2011 IMPRESSION:  1.  Improved aeration of the right upper and mid lung which given rapid interval change is favored to represent improving pulmonary edema. 2.  Persistent opacities within the right lung base may represent residual asymmetric pulmonary edema, though underlying infection is not excluded. Continued attention on follow-up is recommended.    Medications: Scheduled Meds:   . azithromycin  250 mg Oral Daily  . carbamazepine  200 mg Oral BID  . Chlorhexidine Gluconate Cloth  6 each Topical Q0600  . diltiazem  30 mg Oral Q6H  . estradiol  2 g Vaginal 3 times weekly  . furosemide  40 mg Intravenous TID  . insulin aspart  0-9 Units Subcutaneous  Q4H  . ipratropium  0.5 mg Nebulization TID  . lamoTRIgine  25 mg Oral BID  . levalbuterol  1.25 mg Nebulization TID  . LORazepam  1 mg Oral QHS  . magic mouthwash  15 mL Oral TID  . metoprolol  50 mg Oral BID  . metroNIDAZOLE  500 mg Oral Q8H  . mupirocin ointment  1 application Nasal BID  . potassium chloride  40 mEq Oral TID  . potassium chloride  40 mEq Oral BID  . warfarin  1 mg Oral ONCE-1800  . DISCONTD: azithromycin  500 mg Intravenous Q24H  . DISCONTD: cefTRIAXone (ROCEPHIN)  IV  1 g Intravenous Q24H  . DISCONTD: furosemide  20 mg Intravenous Q12H  . DISCONTD: furosemide  40 mg Intravenous Q8H  . DISCONTD: furosemide  40 mg Intravenous BID  . DISCONTD: metronidazole  500 mg Intravenous Q8H  . DISCONTD: potassium chloride  40 mEq Per Tube TID  . DISCONTD: potassium chloride  40 mEq Oral BID  . DISCONTD: potassium chloride  40 mEq Oral Once   Continuous Infusions:   . diltiazem (CARDIZEM) infusion 10 mg/hr (08/24/11 0621)  . DISCONTD: sodium chloride 10 mL/hr at 08/24/11 0424   PRN Meds:.acetaminophen, levalbuterol, ondansetron (ZOFRAN) IV, ondansetron, DISCONTD: cloNIDine, DISCONTD: labetalol  Assessment/Plan:  Principal Problem:  *PNA (pneumonia) - will continue Zithromax IV for additional 2 days to complete empiric treatment  - pt maintaining oxygen saturations above 95% on RA and clinically improving  Active Problems:  HYPERTENSION - well controlled with few hypotensive episodes - continue lasix and metoprolol for now but decrease the frequency of Lasix   FIBRILLATION, ATRIAL - rate controlled most of the day - HR goes up on occasions but pt remains asymptomatic - when attempted to give soft diet she has aspirated and HR noted to increase over 100 - continue metoprolol and coumadin   GERD (gastroesophageal reflux disease) - well controlled   Diarrhea of infectious origin - continue antibiotics and pharmacy recommends changing to Vancomycin - I would not  make change at this time given pt's high risk of aspiration and I am trying to avoid PO medications as much as possible which is why we changed Flagyl to IV in the first place - to treat C. Diff we need to have oral vanc not IV   Hypokalemia - continue to supplement if indicated   Hypomagnesemia - supplemented   Diastolic CHF, acute - clinically compensated   Dysphagia - per son request diet was changed to soft - pt however did not pass swallow study and DYS I diet was recommended - this was discussed with son   LOS: 4 days   [name], [name] 08/24/2011, 8:11 AM

## 2011-08-24 NOTE — Progress Notes (Signed)
Speech Pathology: Dysphagia Treatment Note  Patient was observed with : Pureed and Nectar liquids.  Patient was noted to have s/s of aspiration : No  Lung Sounds:  Diminished per RN Temperature: afebrile  Clinical Impression:  Demonstrated willingness and safe tolerance of recommended diet textures of puree and nectar-thick liquids.  RN initially concerned that patient may not have safely swallowed oral medications following MBSS.  Therapeutic SLP interventions that facilitated increased alertness and likely contributed to her tolerance included sitting patient upright in bed, engaging her cognitively to ensure her alertness prior to PO administration as well as assisting patient to feed self (versus feeding patient via total assist unnecessarily).  Recommendations:  1. Continue recommended diet from today's MBSS of Dys.1 (puree) and Nectar-thick liquids only after engaging patient and ensuring her alertness level.   Pain:   Denied, stated "I just feel awful." Intervention Required:   No  Goals: Progressing  Myra Rude, M.S.,CCC-SLP Pager [phone]

## 2011-08-24 NOTE — Progress Notes (Signed)
HOME HEALTH AGENCIES SERVING [place]   Agencies that are Medicare-Certified and are affiliated with [facility]  Telephone Number Address  [facility]   The [facility] has ownership interest in this company; however, you are under no obligation to use this agency. [phone] or  [phone] [address] [place], [place] [address]   Agencies that are Medicare-Certified and are not affiliated with [facility] Agency Telephone Number Address  [facility] [phone] Fax [phone] [address] [place], [place]  [address]  [facility] [phone] or [phone] Fax [phone] [address] [place], [place] [address]  [facility] [phone] Fax [phone] [address] [place], [place] [address]  [facility] [phone] Fax [phone] [address] [place], [place]  [address]  [facility] [phone] Fax [phone] [address] [place], [place] [address]  [facility] [phone] [address] [place], [place] [address]  [facility] [phone]  [address] [place], [place] [address]  [facility] [phone] or [phone] Fax [phone] [address] [place], [place]  [address]  [facility] [phone] Fax [phone] [address] [place], [place]  [address]  [facility]  [phone] Fax [phone] [address] [place], [place] [address]      CARE MANAGEMENT NOTE 08/24/2011  Patient:  [name], [name]   Account Number:  [id number]  Date Initiated:  08/21/2011  Documentation initiated by:  Junius Creamer  Subjective/Objective  Assessment:   adm w diarrhea, ?colitis     Action/Plan:   lives w husband, pcp dr Selena Batten   Anticipated DC Date:  08/23/2011   Anticipated DC Plan:  HOME/SELF CARE      DC Planning Services  CM consult      [facility] Choice  HOME HEALTH   Choice offered to / List presented to:  C-1 Patient        HH arranged  HH-1 RN  HH-2 PT      HH agency  [facility]   Discharge Disposition:  HOME W HOME HEALTH SERVICES  Comments:  11/27 debbie dowell rn,bsn  08/24/11 1150 [name] RN MSN CCM PT recommends home health therapy, pt will also need RN. Discussed with pt and provided list of agencies.  Per pt, [facility] provided services previously and she would prefer that agency.  List of agencies in chart, referral made per request.  08/23/11 1000 [name] RN MSN CCM PT to eval, await recommendations.  Pt has rolling walker and 3-N-1.

## 2011-08-24 NOTE — Procedures (Signed)
Modified Barium Swallow Procedure Note Patient Details  Name: [name] MRN: [id number] Date of Birth: 07-Mar-1936  Today's Date: 08/24/2011 Time: 5366-4403 Time Calculation (min): 41 min  HPI: 75 y/o admitted for pseudomembranous colitis transferred to SDU for hypoxia, requiring NRB and BiPAP.  Chest x-ray on 08/03/11 revealed improved aeration of the right upper and mid lung which given rapid interval change is favored to represent improving pulmonary edema; persistent opacities within the right lung base may represent residual asymmetric pulmonary edema, though underlying infection is not excluded. Bedside swallow evaluation on 08/23/11 revealed clinical indicators for a dysphagia with aspiration.  Patient's son requested a PO diet this a.m. Prior to [facility] which Dr. Duffy Bruce ordered a Dys.3/thin liquid diet, however patient has not yet received a meal tray.  Recommendation/Prognosis  Dysphagia Diagnosis: Mild pharyngeal phase dysphagia;Mild oral phase dysphagia  Clinical impression: Demonstrates a both a mild oral and pharyngeal phase dysphagia marked predominantly by xerostomia and hard, crusty oral secretions that impeded swallow sensation and function.  Extensive oral care provided to alleviate this etiology, however patient resistant to aggressive oral care attempts.  Observed deep, silent laryngeal penetration of thin liquids without any aspiration, however due to mucosal issues and significant cognitive impairment with a questionable right lower lobe pna, will proceed with a conservative diet with the expectation of an advancement as cognition and oral care improve.  Recommendations 1. Dysphagia 1 (Puree) & Nectar-Thick liquids 2. Medication Administration: Whole meds with puree 3. Full supervision/cueing for compensatory strategies 4. Slow rate;Small sips/bites; Check for pocketing; Follow solids with liquid 5. Seated upright 90 degrees;Upright 30-60 min after meal Oral care  QID  Other Recommendations: Order thickener from pharmacy;Prohibited food (jello, ice cream, thin soups);Remove water pitcher;Have oral suction available  SLP Goals  Patient will: 1. Consume Dys.1/nectar-thick liquid diet with minimal verbal cues and no s/s of aspiration. 2. Lingually clear oral residue with moderate verbal cues. 3. Demonstrate improved sustained attention/cognition & increased oral secretions in order to trial upgraded thin liquids at bedside with minimal verbal cues.  [name], M.S.,CCC-SLP Pager [phone]

## 2011-08-24 NOTE — Progress Notes (Signed)
ANTICOAGULATION CONSULT NOTE - Follow Up Consult  Pharmacy Consult for Warfarin  Indication: Afib and hx/o CVA  Patient Measurements: Height: 5\' 6"  (167.6 cm) Weight: 166 lb 10.7 oz (75.6 kg) IBW/kg (Calculated) : 59.3   Vital Signs: Temp: 99.1 F (37.3 C) (11/30 0408) Temp src: Oral (11/30 0408) BP: 160/82 mmHg (11/30 1046) Pulse Rate: 140  (11/30 1046)  Labs:  Basename 08/24/11 0510 08/23/11 0620 08/22/11 0258 08/22/11 0235  HGB 12.1 11.7* -- --  HCT 36.3 35.1* -- 39.0  PLT 291 266 -- 225  APTT -- -- -- --  LABPROT 33.5* 30.2* 24.7* --  INR 3.23* 2.83* 2.19* --  HEPARINUNFRC -- -- -- --  CREATININE 1.00 0.99 -- 0.86  CKTOTAL -- -- -- --  CKMB -- -- -- --  TROPONINI -- -- -- --   Estimated Creatinine Clearance: 52 ml/min (by C-G formula based on Cr of 1).  Warfarin History: PTA: 2 mg daily EXCEPT for 1 mg on MWF  Assessment: 75 y.o. F on warfarin for Afib with a therapeutic INR this a.m. Patient noted with C. Diff on flagyl and interaction with coumadin is anticipated.  Goal of Therapy: INR 2-3   Plan:  1. No coumadin today, INR increased secondary to interaction with Flagyl (consider vancomycin po) 2. Will continue to monitor for any signs/symptoms of bleeding and will follow up with PT/INR in the a.m.   Elwin Sleight 08/24/2011,11:00 AM

## 2011-08-24 NOTE — Progress Notes (Signed)
Name: LIANETTE BROUSSARD MRN: [id number] DOB: 03/06/1936  DOS: 08/22/2011    LOS: 4  CRITICAL CARE FOLLOW UP NOTE  Brief Hx - 75 y/o admitted for pseudomembranous colitis transferred to SDU for hypoxia, requiring NRB and BiPAP.   Lines / Drains: None  Cultures / Sepsis markers: 11/27  BC>>> 11/28  MRSA PCR>>>pos 11/27  Stool C>>> 11/26  UC>>> 11/27  C.diff PCR>>>pos  Antibiotics: 11/28  Azithromycin (CAP)>>> 11/28  Rocephin (CAP)>>> 11/27  Flagyl (C.dif)>>>  Tests / Events: 11/28  Transferred to SDU with Afib/RVR / respiratory distress requiring BiPAP 11/28  TTE>>>  Subjective/ Overnight: -  Feels much better with bipap.   Vital Signs:  Filed Vitals:   08/24/11 0542 08/24/11 0830 08/24/11 1046 08/24/11 1216  BP:   160/82 154/103  Pulse:   140 117  Temp:    97.9 F (36.6 C)  TempSrc:    Oral  Resp:    28  Height:      Weight:      SpO2: 92% 94%  92%    Intake/Output Summary (Last 24 hours) at 08/24/11 1256 Last data filed at 08/24/11 4098  Gross per 24 hour  Intake 825.42 ml  Output   2625 ml  Net -1799.58 ml     Physical Examination: Neuro:  Sleepy, but wakes up and follows commands, somewhat confused HEENT:  BiPAP mask, mm dry  Heart:  Irregular Lungs:  Diminished R, Few scattered ronchi L, Resps even non labored on bipap Abdomen:  Soft, non tender, bowel sounds present Extremities: Trace edema  Labs and Imaging:   BMET    Component Value Date/Time   NA 142 08/24/2011 0510   K 3.0* 08/24/2011 0510   CL 103 08/24/2011 0510   CO2 26 08/24/2011 0510   GLUCOSE 164* 08/24/2011 0510   BUN 18 08/24/2011 0510   CREATININE 1.00 08/24/2011 0510   CALCIUM 8.5 08/24/2011 0510   GFRNONAA 55* 08/24/2011 0510   GFRAA 63* 08/24/2011 0510    CBC    Component Value Date/Time   WBC 14.7* 08/24/2011 0510   RBC 3.80* 08/24/2011 0510   HGB 12.1 08/24/2011 0510   HCT 36.3 08/24/2011 0510   PLT 291 08/24/2011 0510   MCV 95.5 08/24/2011 0510   MCH 31.8  08/24/2011 0510   MCHC 33.3 08/24/2011 0510   RDW 14.3 08/24/2011 0510   LYMPHSABS 0.5* 08/20/2011 1451   MONOABS 1.6* 08/20/2011 1451   EOSABS 0.0 08/20/2011 1451   BASOSABS 0.0 08/20/2011 1451      Assessment and Plan: Acute hypoxemic respiratory failure secondary to fluid overload in the setting of atrial fibrillation and likely diastolic disfunction. Echo pending.   Lab 08/22/11 0602 08/22/11 0230  PHART 7.384 7.443*  PCO2ART 36.1 31.6*  PO2ART 137.0* 58.7*  -->d/c IVF. -->Increase lasix to 40 mg IV q8 hours x2 doses. -->Replace K. -->Used BiPAP overnight but now off and able to use [place]. -->no steroids for now. -->f/u CXR.   Mild asymmetric LL infiltrate, ? Pneumonia  Lab 08/24/11 0510 08/23/11 0620 08/22/11 0235 08/21/11 0520 08/20/11 1451  WBC 14.7* 19.6* 20.9* 18.4* 16.2*  -->Cont empirical Azithromycin and Ceftriaxone -->will taper ABx rapidly if clinically stable, given pseudomembranous colitis  Atrial fibrillation with RVR, on Coumadin  Lab 08/24/11 0510 08/23/11 0620 08/22/11 0258 08/21/11 0520 08/20/11 1432  INR 3.23* 2.83* 2.19* 2.05* 1.74*  -->Cardizem gtt>>>change to PO when more clinically stable. -->Coumadin per pharmacy  Hypomagnesemia -->replace PRN  Pseudomembranous colitis -->Flagyl, per  primary   Hypertension -- cont  Metoprolol, prn clonidine  Seizure disorder -->Lamictal, per primary   PCCM signing off at this point, please call back if needed.  Koren Bound, M.D.

## 2011-08-25 LAB — CBC
MCH: 31.4 pg (ref 26.0–34.0)
MCV: 97.3 fL (ref 78.0–100.0)
Platelets: 299 10*3/uL (ref 150–400)
RDW: 14.4 % (ref 11.5–15.5)

## 2011-08-25 LAB — GLUCOSE, CAPILLARY: Glucose-Capillary: 164 mg/dL — ABNORMAL HIGH (ref 70–99)

## 2011-08-25 LAB — BASIC METABOLIC PANEL
BUN: 21 mg/dL (ref 6–23)
CO2: 29 mEq/L (ref 19–32)
Calcium: 9 mg/dL (ref 8.4–10.5)
Creatinine, Ser: 0.97 mg/dL (ref 0.50–1.10)
GFR calc Af Amer: 66 mL/min — ABNORMAL LOW (ref >90)

## 2011-08-25 LAB — STOOL CULTURE

## 2011-08-25 MED ORDER — STARCH (THICKENING) PO POWD
227.0000 g | ORAL | Status: DC | PRN
  Filled 2011-08-25: qty 227

## 2011-08-25 MED ORDER — LISINOPRIL 40 MG PO TABS
40.0000 mg | ORAL_TABLET | Freq: Every day | ORAL | Status: DC
  Administered 2011-08-25 – 2011-09-04 (×11): 40 mg via ORAL
  Filled 2011-08-25 (×11): qty 1

## 2011-08-25 MED ORDER — POTASSIUM CHLORIDE 20 MEQ/15ML (10%) PO LIQD
40.0000 meq | Freq: Every day | ORAL | Status: DC
  Administered 2011-08-25 – 2011-08-27 (×3): 40 meq via ORAL
  Filled 2011-08-25 (×4): qty 30

## 2011-08-25 NOTE — Progress Notes (Signed)
ANTICOAGULATION CONSULT NOTE - Follow Up Consult  Pharmacy Consult for Coumadin Indication: A.Fib  Allergies  Allergen Reactions  . Ciprofloxacin     REACTION: diarrhea/yeast infection  . Penicillins Hives    Patient Measurements: Height: 5\' 6"  (167.6 cm) Weight: 166 lb 10.7 oz (75.6 kg) IBW/kg (Calculated) : 59.3    Vital Signs: Temp: 97.6 F (36.4 C) (12/01 0848) Temp src: Oral (12/01 0848) BP: 179/100 mmHg (12/01 0848) Pulse Rate: 129  (12/01 0848)  Labs:  Basename 08/25/11 0715 08/24/11 0510 08/23/11 0620  HGB 12.8 12.1 --  HCT 39.6 36.3 35.1*  PLT 299 291 266  APTT -- -- --  LABPROT 35.7* 33.5* 30.2*  INR 3.51* 3.23* 2.83*  HEPARINUNFRC -- -- --  CREATININE 0.97 1.00 0.99  CKTOTAL -- -- --  CKMB -- -- --  TROPONINI -- -- --   Estimated Creatinine Clearance: 53.7 ml/min (by C-G formula based on Cr of 0.97).   Medications:  Scheduled:    . azithromycin  250 mg Intravenous Q24H  . carbamazepine  200 mg Oral BID  . cefTRIAXone (ROCEPHIN)  IV  1 g Intravenous Q24H  . Chlorhexidine Gluconate Cloth  6 each Topical Q0600  . estradiol  2 g Vaginal 3 times weekly  . insulin aspart  0-9 Units Subcutaneous Q4H  . lamoTRIgine  25 mg Oral BID  . LORazepam  1 mg Oral QHS  . magic mouthwash  15 mL Oral TID  . metoprolol  5 mg Intravenous Q6H  . metronidazole  500 mg Intravenous Q8H  . mupirocin ointment  1 application Nasal BID  . potassium chloride  40 mEq Oral Daily  . DISCONTD: ipratropium  0.5 mg Nebulization TID  . DISCONTD: levalbuterol  1.25 mg Nebulization TID  . DISCONTD: metoprolol  50 mg Oral BID  . DISCONTD: potassium chloride  40 mEq Oral BID   Goal of Therapy:  INR 2-3   Assessment/Plan:  75yo female with A.Fib.  INR up again to 3.51- reflective of Flagyl effect (C.Diff).  No bleeding problems noted.  Will give no Coumadin today and f/u in AM.  Dmya Long P 08/25/2011,12:38 PM

## 2011-08-25 NOTE — Progress Notes (Signed)
Patient ID: [name], female   DOB: February 27, 1936, 75 y.o.   MRN: [id number]  Subjective: No events overnight. Patient denies chest pain, shortness of breath, abdominal pain. Had bowel movement and reports ambulating.  Objective:  Vital signs in last 24 hours:  Filed Vitals:   08/25/11 0425 08/25/11 0700 08/25/11 0818 08/25/11 0848  BP:    179/100  Pulse:  63  129  Temp: 98 F (36.7 C)   97.6 F (36.4 C)  TempSrc: Oral   Oral  Resp:  24  19  Height:      Weight:      SpO2:  89% 97% 95%    Intake/Output from previous day:   Intake/Output Summary (Last 24 hours) at 08/25/11 0941 Last data filed at 08/25/11 0911  Gross per 24 hour  Intake    360 ml  Output   3150 ml  Net  -2790 ml    Physical Exam: General: Alert, awake, oriented x3, in no acute distress. HEENT: No bruits, no goiter. Moist mucous membranes, no scleral icterus, no conjunctival pallor. Heart: Irregular rate and rhythm, without murmurs, rubs, gallops. Lungs: Clear to auscultation bilaterally. No wheezing, no rhonchi, no rales.  Abdomen: Soft, nontender, nondistended, positive bowel sounds. Extremities: No clubbing cyanosis or edema,  positive pedal pulses. Neuro: Grossly intact, nonfocal.    Lab Results:  Basic Metabolic Panel:    Component Value Date/Time   NA 147* 08/25/2011 0715   K 3.0* 08/25/2011 0715   CL 105 08/25/2011 0715   CO2 29 08/25/2011 0715   BUN 21 08/25/2011 0715   CREATININE 0.97 08/25/2011 0715   GLUCOSE 90 08/25/2011 0715   CALCIUM 9.0 08/25/2011 0715   CBC:    Component Value Date/Time   WBC 13.3* 08/25/2011 0715   HGB 12.8 08/25/2011 0715   HCT 39.6 08/25/2011 0715   PLT 299 08/25/2011 0715   MCV 97.3 08/25/2011 0715   NEUTROABS 14.1* 08/20/2011 1451   LYMPHSABS 0.5* 08/20/2011 1451   MONOABS 1.6* 08/20/2011 1451   EOSABS 0.0 08/20/2011 1451   BASOSABS 0.0 08/20/2011 1451      Lab 08/25/11 0715 08/24/11 0510 08/23/11 0620 08/22/11 0235 08/21/11 0520 08/20/11 1451  WBC  13.3* 14.7* 19.6* 20.9* 18.4* --  HGB 12.8 12.1 11.7* 13.1 11.7* --  HCT 39.6 36.3 35.1* 39.0 35.0* --  PLT 299 291 266 225 197 --  MCV 97.3 95.5 95.9 97.3 97.2 --  MCH 31.4 31.8 32.0 32.7 32.5 --  MCHC 32.3 33.3 33.3 33.6 33.4 --  RDW 14.4 14.3 14.1 14.1 13.8 --  LYMPHSABS -- -- -- -- -- 0.5*  MONOABS -- -- -- -- -- 1.6*  EOSABS -- -- -- -- -- 0.0  BASOSABS -- -- -- -- -- 0.0  BANDABS -- -- -- -- -- --    Lab 08/25/11 0715 08/24/11 0510 08/23/11 0620 08/22/11 0235 08/21/11 0520  NA 147* 142 140 141 142  K 3.0* 3.0* 3.2* 4.3 3.3*  CL 105 103 103 107 107  CO2 29 26 25 22 24   GLUCOSE 90 164* 108* 168* 124*  BUN 21 18 19 11 11   CREATININE 0.97 1.00 0.99 0.86 0.99  CALCIUM 9.0 8.5 8.7 8.9 8.8  MG -- 1.6 2.3 1.5 --    Lab 08/25/11 0715 08/24/11 0510 08/23/11 0620 08/22/11 0258 08/21/11 0520  INR 3.51* 3.23* 2.83* 2.19* 2.05*  PROTIME -- -- -- -- --   Cardiac markers: No results found for this basename: CK:3,CKMB:3,TROPONINI:3,MYOGLOBIN:3 in  the last 168 hours  Lab 08/22/11 0238  POCBNP 2548.0*   Recent Results (from the past 240 hour(s))  URINE CULTURE     Status: Normal   Collection Time   08/20/11  3:23 PM      Component Value Range Status Comment   Specimen Description URINE, CLEAN CATCH   Final    Special Requests NONE   Final    Setup Time [id number]   Final    Colony Count 20,OOO COLONIES/ML   Final    Culture ENTEROCOCCUS SPECIES   Final    Report Status 08/24/2011 FINAL   Final    Organism ID, Bacteria ENTEROCOCCUS SPECIES   Final   CULTURE, BLOOD (ROUTINE X 2)     Status: Normal (Preliminary result)   Collection Time   08/21/11  1:00 AM      Component Value Range Status Comment   Specimen Description BLOOD LEFT ARM   Final    Special Requests BOTTLES DRAWN AEROBIC AND ANAEROBIC 10CC EA   Final    Setup Time [id number]   Final    Culture     Final    Value:        BLOOD CULTURE RECEIVED NO GROWTH TO DATE CULTURE WILL BE HELD FOR 5 DAYS BEFORE ISSUING A  FINAL NEGATIVE REPORT   Report Status PENDING   Incomplete   CULTURE, BLOOD (ROUTINE X 2)     Status: Normal (Preliminary result)   Collection Time   08/21/11  1:15 AM      Component Value Range Status Comment   Specimen Description BLOOD LEFT WRIST   Final    Special Requests BOTTLES DRAWN AEROBIC AND ANAEROBIC 10CC EA   Final    Setup Time [id number]   Final    Culture     Final    Value:        BLOOD CULTURE RECEIVED NO GROWTH TO DATE CULTURE WILL BE HELD FOR 5 DAYS BEFORE ISSUING A FINAL NEGATIVE REPORT   Report Status PENDING   Incomplete   CLOSTRIDIUM DIFFICILE BY PCR     Status: Abnormal   Collection Time   08/21/11  8:31 AM      Component Value Range Status Comment   C difficile by pcr POSITIVE (*) NEGATIVE  Final   STOOL CULTURE     Status: Normal (Preliminary result)   Collection Time   08/21/11  8:32 AM      Component Value Range Status Comment   Specimen Description STOOL   Final    Special Requests NONE   Final    Culture NO SUSPICIOUS COLONIES, CONTINUING TO HOLD   Final    Report Status PENDING   Incomplete   MRSA PCR SCREENING     Status: Abnormal   Collection Time   08/22/11  3:25 AM      Component Value Range Status Comment   MRSA by PCR POSITIVE (*) NEGATIVE  Final     Studies/Results: Dg Swallowing Func-no Report  08/24/2011  CLINICAL DATA: objectively assess swallow function   FLUOROSCOPY FOR SWALLOWING FUNCTION STUDY:  Fluoroscopy was provided for swallowing function study, which was  administered by a speech pathologist.  Final results and recommendations  from this study are contained within the speech pathology report.      Medications: Scheduled Meds:   . azithromycin  250 mg Intravenous Q24H  . carbamazepine  200 mg Oral BID  . cefTRIAXone (ROCEPHIN)  IV  1 g Intravenous Q24H  .  Chlorhexidine Gluconate Cloth  6 each Topical Q0600  . estradiol  2 g Vaginal 3 times weekly  . insulin aspart  0-9 Units Subcutaneous Q4H  . ipratropium  0.5 mg  Nebulization TID  . lamoTRIgine  25 mg Oral BID  . levalbuterol  1.25 mg Nebulization TID  . LORazepam  1 mg Oral QHS  . magic mouthwash  15 mL Oral TID  . metoprolol  5 mg Intravenous Q6H  . metronidazole  500 mg Intravenous Q8H  . mupirocin ointment  1 application Nasal BID  . potassium chloride  40 mEq Oral Daily  . DISCONTD: azithromycin  250 mg Oral Daily  . DISCONTD: diltiazem  30 mg Oral Q6H  . DISCONTD: furosemide  40 mg Intravenous TID  . DISCONTD: metoprolol  50 mg Oral BID  . DISCONTD: metroNIDAZOLE  500 mg Oral Q8H  . DISCONTD: potassium chloride  40 mEq Oral BID   Continuous Infusions:  PRN Meds:.acetaminophen, levalbuterol, ondansetron (ZOFRAN) IV, DISCONTD: ondansetron  Assessment/Plan:  Principal Problem:  *PNA (pneumonia)  - will continue Zithromax IV for additional 2 days to complete empiric treatment  - pt maintaining oxygen saturations above 95% on RA and clinically improving  Active Problems:  HYPERTENSION  - well controlled with few hypotensive episodes  - continue lasix and metoprolol for now but decrease the frequency of Lasix  FIBRILLATION, ATRIAL  - rate controlled most of the day  - HR goes up on occasions but pt remains asymptomatic  - continue metoprolol and coumadin  GERD (gastroesophageal reflux disease)  - well controlled  Diarrhea of infectious origin  - continue antibiotics and pharmacy recommends changing to Vancomycin  - I would not make change at this time given pt's high risk of aspiration and I am trying to avoid PO medications as much as possible which is why we changed Flagyl to IV in the first place  - to treat C. Diff we need to have oral vanc not IV  Hypokalemia  - continue to supplement  Hypomagnesemia  - supplemented  Diastolic CHF, acute  - clinically compensated  Dysphagia  - per son request diet was changed to soft  - pt however did not pass swallow study and DYS I diet was recommended  - this was discussed with  son    LOS: 5 days   MAGICK-MYERS, ISKRA 08/25/2011, 9:41 AM

## 2011-08-26 LAB — CBC
HCT: 39.1 % (ref 36.0–46.0)
Hemoglobin: 13.1 g/dL (ref 12.0–15.0)
MCH: 32.8 pg (ref 26.0–34.0)
MCV: 97.8 fL (ref 78.0–100.0)
Platelets: 336 10*3/uL (ref 150–400)
RBC: 4 MIL/uL (ref 3.87–5.11)

## 2011-08-26 LAB — BASIC METABOLIC PANEL
BUN: 23 mg/dL (ref 6–23)
CO2: 29 mEq/L (ref 19–32)
Calcium: 9.2 mg/dL (ref 8.4–10.5)
Chloride: 103 mEq/L (ref 96–112)
Creatinine, Ser: 0.83 mg/dL (ref 0.50–1.10)
Glucose, Bld: 112 mg/dL — ABNORMAL HIGH (ref 70–99)

## 2011-08-26 LAB — GLUCOSE, CAPILLARY
Glucose-Capillary: 150 mg/dL — ABNORMAL HIGH (ref 70–99)
Glucose-Capillary: 139 mg/dL — ABNORMAL HIGH (ref 70–99)

## 2011-08-26 MED ORDER — METOPROLOL TARTRATE 50 MG PO TABS
50.0000 mg | ORAL_TABLET | Freq: Two times a day (BID) | ORAL | Status: DC
  Administered 2011-08-26 – 2011-08-29 (×7): 50 mg via ORAL
  Filled 2011-08-26 (×9): qty 1

## 2011-08-26 MED ORDER — HYDRALAZINE HCL 20 MG/ML IJ SOLN
5.0000 mg | Freq: Four times a day (QID) | INTRAMUSCULAR | Status: DC | PRN
  Administered 2011-08-28: 09:00:00 5 mg via INTRAVENOUS
  Filled 2011-08-26: qty 0.25

## 2011-08-26 NOTE — Progress Notes (Signed)
Physician notified of patient's elevated blood pressures today. Most recent pressure being 187/115. Patient is asymptomatic. Will continue to monitor. Lurline Idol [facility]

## 2011-08-26 NOTE — Progress Notes (Signed)
ANTICOAGULATION CONSULT NOTE - Follow Up Consult  Pharmacy Consult for Coumadin Indication:  A.Fib  Allergies  Allergen Reactions  . Ciprofloxacin     REACTION: diarrhea/yeast infection  . Penicillins Hives    Patient Measurements: Height: 5\' 6"  (167.6 cm) Weight: 166 lb 10.7 oz (75.6 kg) IBW/kg (Calculated) : 59.3    Vital Signs: Temp: 97.9 F (36.6 C) (12/02 1200) Temp src: Oral (12/02 1200) BP: 151/100 mmHg (12/02 1200) Pulse Rate: 115  (12/02 1200)  Labs:  Basename 08/26/11 0500 08/25/11 0715 08/24/11 0510  HGB 13.1 12.8 --  HCT 39.1 39.6 36.3  PLT 336 299 291  APTT -- -- --  LABPROT 37.3* 35.7* 33.5*  INR 3.71* 3.51* 3.23*  HEPARINUNFRC -- -- --  CREATININE 0.83 0.97 1.00  CKTOTAL -- -- --  CKMB -- -- --  TROPONINI -- -- --   Estimated Creatinine Clearance: 62.7 ml/min (by C-G formula based on Cr of 0.83).   Medications:  Scheduled:    . azithromycin  250 mg Intravenous Q24H  . carbamazepine  200 mg Oral BID  . cefTRIAXone (ROCEPHIN)  IV  1 g Intravenous Q24H  . Chlorhexidine Gluconate Cloth  6 each Topical Q0600  . estradiol  2 g Vaginal 3 times weekly  . insulin aspart  0-9 Units Subcutaneous Q4H  . lamoTRIgine  25 mg Oral BID  . lisinopril  40 mg Oral Daily  . LORazepam  1 mg Oral QHS  . magic mouthwash  15 mL Oral TID  . metoprolol  5 mg Intravenous Q6H  . metronidazole  500 mg Intravenous Q8H  . mupirocin ointment  1 application Nasal BID  . potassium chloride  40 mEq Oral Daily    Assessment: 75yo female on Coumadin for A.Fib.  INR cont to increase to 3.71 this AM- reflecting Flagyl effect (being treated for C.Diff).  Hg stable, no bleeding issues noted.  Goal of Therapy:  INR 2-3   Plan:  1.  No Coumadin today 2.  Consider changing Flagyl to po Vancomycin for C.Diff given drug interaction with Coumadin.   Shoni Quijas P 08/26/2011,2:17 PM

## 2011-08-26 NOTE — Progress Notes (Signed)
[name] Krabbe, [name] notified of pt elevated BP 184/ 118 and HR 143. New orders written. Bernie Covey, RN

## 2011-08-26 NOTE — Progress Notes (Addendum)
Patient ID: [name], female   DOB: May 16, 1936, 75 y.o.   MRN: [id number]  Subjective: No events overnight. Patient denies chest pain, shortness of breath, abdominal pain. Continues to have difficulty swallowing and diarrhea.  Objective:  Vital signs in last 24 hours:  Filed Vitals:   08/26/11 0900 08/26/11 1023 08/26/11 1100 08/26/11 1200  BP:  165/105 173/94 151/100  Pulse: 106 118 96 115  Temp:    97.9 F (36.6 C)  TempSrc:    Oral  Resp: 20   47  Height:      Weight:      SpO2: 96%   96%    Intake/Output from previous day:   Intake/Output Summary (Last 24 hours) at 08/26/11 1428 Last data filed at 08/26/11 1300  Gross per 24 hour  Intake    960 ml  Output   1002 ml  Net    -42 ml    Physical Exam: General: Alert, awake, oriented x3, in no acute distress. HEENT: No bruits, no goiter. Moist mucous membranes, no scleral icterus, no conjunctival pallor. Heart: Irregular rate and rhythm, without murmurs, rubs, gallops. Lungs: Clear to auscultation bilaterally. No wheezing, no rhonchi, no rales.  Abdomen: Soft, nontender, nondistended, positive bowel sounds. Extremities: No clubbing cyanosis or edema,  positive pedal pulses. Neuro: Grossly intact, nonfocal.    Lab Results:  Basic Metabolic Panel:    Component Value Date/Time   NA 140 08/26/2011 0500   K 3.4* 08/26/2011 0500   CL 103 08/26/2011 0500   CO2 29 08/26/2011 0500   BUN 23 08/26/2011 0500   CREATININE 0.83 08/26/2011 0500   GLUCOSE 112* 08/26/2011 0500   CALCIUM 9.2 08/26/2011 0500   CBC:    Component Value Date/Time   WBC 15.0* 08/26/2011 0500   HGB 13.1 08/26/2011 0500   HCT 39.1 08/26/2011 0500   PLT 336 08/26/2011 0500   MCV 97.8 08/26/2011 0500   NEUTROABS 14.1* 08/20/2011 1451   LYMPHSABS 0.5* 08/20/2011 1451   MONOABS 1.6* 08/20/2011 1451   EOSABS 0.0 08/20/2011 1451   BASOSABS 0.0 08/20/2011 1451      Lab 08/26/11 0500 08/25/11 0715 08/24/11 0510 08/23/11 0620 08/22/11 0235 08/20/11  1451  WBC 15.0* 13.3* 14.7* 19.6* 20.9* --  HGB 13.1 12.8 12.1 11.7* 13.1 --  HCT 39.1 39.6 36.3 35.1* 39.0 --  PLT 336 299 291 266 225 --  MCV 97.8 97.3 95.5 95.9 97.3 --  MCH 32.8 31.4 31.8 32.0 32.7 --  MCHC 33.5 32.3 33.3 33.3 33.6 --  RDW 14.5 14.4 14.3 14.1 14.1 --  LYMPHSABS -- -- -- -- -- 0.5*  MONOABS -- -- -- -- -- 1.6*  EOSABS -- -- -- -- -- 0.0  BASOSABS -- -- -- -- -- 0.0  BANDABS -- -- -- -- -- --    Lab 08/26/11 0500 08/25/11 0715 08/24/11 0510 08/23/11 0620 08/22/11 0235  NA 140 147* 142 140 141  K 3.4* 3.0* 3.0* 3.2* 4.3  CL 103 105 103 103 107  CO2 29 29 26 25 22   GLUCOSE 112* 90 164* 108* 168*  BUN 23 21 18 19 11   CREATININE 0.83 0.97 1.00 0.99 0.86  CALCIUM 9.2 9.0 8.5 8.7 8.9  MG -- -- 1.6 2.3 1.5    Lab 08/26/11 0500 08/25/11 0715 08/24/11 0510 08/23/11 0620 08/22/11 0258  INR 3.71* 3.51* 3.23* 2.83* 2.19*  PROTIME -- -- -- -- --   Cardiac markers: No results found for this basename: CK:3,CKMB:3,TROPONINI:3,MYOGLOBIN:3 in  the last 168 hours  Lab 08/22/11 0238  POCBNP 2548.0*   Recent Results (from the past 240 hour(s))  URINE CULTURE     Status: Normal   Collection Time   08/20/11  3:23 PM      Component Value Range Status Comment   Specimen Description URINE, CLEAN CATCH   Final    Special Requests NONE   Final    Setup Time [id number]   Final    Colony Count 20,OOO COLONIES/ML   Final    Culture ENTEROCOCCUS SPECIES   Final    Report Status 08/24/2011 FINAL   Final    Organism ID, Bacteria ENTEROCOCCUS SPECIES   Final   CULTURE, BLOOD (ROUTINE X 2)     Status: Normal (Preliminary result)   Collection Time   08/21/11  1:00 AM      Component Value Range Status Comment   Specimen Description BLOOD LEFT ARM   Final    Special Requests BOTTLES DRAWN AEROBIC AND ANAEROBIC 10CC EA   Final    Setup Time [id number]   Final    Culture     Final    Value:        BLOOD CULTURE RECEIVED NO GROWTH TO DATE CULTURE WILL BE HELD FOR 5 DAYS BEFORE  ISSUING A FINAL NEGATIVE REPORT   Report Status PENDING   Incomplete   CULTURE, BLOOD (ROUTINE X 2)     Status: Normal (Preliminary result)   Collection Time   08/21/11  1:15 AM      Component Value Range Status Comment   Specimen Description BLOOD LEFT WRIST   Final    Special Requests BOTTLES DRAWN AEROBIC AND ANAEROBIC 10CC EA   Final    Setup Time [id number]   Final    Culture     Final    Value:        BLOOD CULTURE RECEIVED NO GROWTH TO DATE CULTURE WILL BE HELD FOR 5 DAYS BEFORE ISSUING A FINAL NEGATIVE REPORT   Report Status PENDING   Incomplete   CLOSTRIDIUM DIFFICILE BY PCR     Status: Abnormal   Collection Time   08/21/11  8:31 AM      Component Value Range Status Comment   C difficile by pcr POSITIVE (*) NEGATIVE  Final   STOOL CULTURE     Status: Normal   Collection Time   08/21/11  8:32 AM      Component Value Range Status Comment   Specimen Description STOOL   Final    Special Requests NONE   Final    Culture     Final    Value: NO SALMONELLA, SHIGELLA, CAMPYLOBACTER, OR YERSINIA ISOLATED   Report Status 08/25/2011 FINAL   Final   MRSA PCR SCREENING     Status: Abnormal   Collection Time   08/22/11  3:25 AM      Component Value Range Status Comment   MRSA by PCR POSITIVE (*) NEGATIVE  Final     Studies/Results: No results found.  Medications: Scheduled Meds:   . azithromycin  250 mg Intravenous Q24H  . carbamazepine  200 mg Oral BID  . cefTRIAXone (ROCEPHIN)  IV  1 g Intravenous Q24H  . Chlorhexidine Gluconate Cloth  6 each Topical Q0600  . estradiol  2 g Vaginal 3 times weekly  . insulin aspart  0-9 Units Subcutaneous Q4H  . lamoTRIgine  25 mg Oral BID  . lisinopril  40 mg Oral Daily  . LORazepam  1 mg Oral QHS  . magic mouthwash  15 mL Oral TID  . metoprolol tartrate  50 mg Oral BID  . metronidazole  500 mg Intravenous Q8H  . mupirocin ointment  1 application Nasal BID  . potassium chloride  40 mEq Oral Daily  . DISCONTD: metoprolol  5 mg  Intravenous Q6H   Continuous Infusions:  PRN Meds:.acetaminophen, food thickener, levalbuterol, ondansetron (ZOFRAN) IV  Assessment/Plan:  Principal Problem:  *PNA (pneumonia)  - will continue Zithromax IV for additional day to complete empiric treatment  - pt maintaining oxygen saturations above 95% on RA and clinically improving  Active Problems:  HYPERTENSION  - well controlled with few hypertensive episodes  - continue lasix and metoprolol for now but decrease the frequency of Lasix  FIBRILLATION, ATRIAL  - heart rate uncontrolled - HR goes up on occasions but pt remains asymptomatic  - continue metoprolol but increase the dose to her home medication dose and continue coumadin  GERD (gastroesophageal reflux disease)  - well controlled  Diarrhea of infectious origin  - secondary to C. Diff colitis - continue antibiotics and pharmacy recommends changing to Vancomycin  - I would not make change at this time given pt's high risk of aspiration and I am trying to avoid PO medications as much as possible which is why we changed Flagyl to IV in the first place  - to treat C. Diff we need to have oral vanc not IV  Hypokalemia  - continue to supplement  Hypomagnesemia  - supplemented  Diastolic CHF, acute  - clinically compensated  Dysphagia  - pt still reports dysphagia - call GI to get further recommendations - pt follows with Dr. Arlyce Dice in an outpatient setting   LOS: 6 days   MAGICK-Nikoli Nasser 08/26/2011, 2:28 PM

## 2011-08-27 ENCOUNTER — Inpatient Hospital Stay (HOSPITAL_COMMUNITY): Payer: Medicare Other

## 2011-08-27 DIAGNOSIS — R5383 Other fatigue: Secondary | ICD-10-CM | POA: Diagnosis not present

## 2011-08-27 DIAGNOSIS — R1310 Dysphagia, unspecified: Secondary | ICD-10-CM

## 2011-08-27 LAB — BASIC METABOLIC PANEL
BUN: 22 mg/dL (ref 6–23)
Chloride: 105 mEq/L (ref 96–112)
Creatinine, Ser: 0.79 mg/dL (ref 0.50–1.10)
GFR calc Af Amer: >90 mL/min (ref >90)
Glucose, Bld: 113 mg/dL — ABNORMAL HIGH (ref 70–99)

## 2011-08-27 LAB — CBC
HCT: 39.5 % (ref 36.0–46.0)
Hemoglobin: 13.1 g/dL (ref 12.0–15.0)
MCHC: 33.2 g/dL (ref 30.0–36.0)
MCV: 98.3 fL (ref 78.0–100.0)
RDW: 14.3 % (ref 11.5–15.5)
WBC: 15.5 10*3/uL — ABNORMAL HIGH (ref 4.0–10.5)

## 2011-08-27 LAB — CULTURE, BLOOD (ROUTINE X 2)
Culture  Setup Time: 201211270839
Culture: NO GROWTH

## 2011-08-27 LAB — CARDIAC PANEL(CRET KIN+CKTOT+MB+TROPI)
CK, MB: 1.8 ng/mL (ref 0.3–4.0)
Total CK: 21 U/L (ref 7–177)

## 2011-08-27 LAB — GLUCOSE, CAPILLARY
Glucose-Capillary: 111 mg/dL — ABNORMAL HIGH (ref 70–99)
Glucose-Capillary: 122 mg/dL — ABNORMAL HIGH (ref 70–99)
Glucose-Capillary: 146 mg/dL — ABNORMAL HIGH (ref 70–99)

## 2011-08-27 LAB — PRO B NATRIURETIC PEPTIDE: Pro B Natriuretic peptide (BNP): 1611 pg/mL — ABNORMAL HIGH (ref 0–125)

## 2011-08-27 MED ORDER — AMLODIPINE BESYLATE 10 MG PO TABS
10.0000 mg | ORAL_TABLET | Freq: Every day | ORAL | Status: DC
  Administered 2011-08-27 – 2011-09-04 (×9): 10 mg via ORAL
  Filled 2011-08-27 (×9): qty 1

## 2011-08-27 MED ORDER — ENSURE PUDDING PO PUDG
1.0000 | Freq: Two times a day (BID) | ORAL | Status: DC
  Administered 2011-08-29 – 2011-09-03 (×8): 1 via ORAL
  Filled 2011-08-27 (×15): qty 1

## 2011-08-27 MED ORDER — PANTOPRAZOLE SODIUM 40 MG IV SOLR
40.0000 mg | INTRAVENOUS | Status: DC
  Administered 2011-08-27 – 2011-08-28 (×2): 40 mg via INTRAVENOUS
  Filled 2011-08-27 (×3): qty 40

## 2011-08-27 NOTE — Progress Notes (Signed)
ANTICOAGULATION CONSULT NOTE - Follow Up Consult  Pharmacy Consult for Coumadin Indication:  A.Fib  Allergies  Allergen Reactions  . Ciprofloxacin     REACTION: diarrhea/yeast infection  . Penicillins Hives    Patient Measurements: Height: 5\' 6"  (167.6 cm) Weight: 166 lb 10.7 oz (75.6 kg) IBW/kg (Calculated) : 59.3    Vital Signs: Temp: 98.4 F (36.9 C) (12/03 0800) Temp src: Oral (12/03 0800) BP: 172/107 mmHg (12/03 0800) Pulse Rate: 112  (12/03 0800)  Labs:  Basename 08/27/11 0500 08/26/11 0500 08/25/11 0715  HGB 13.1 13.1 --  HCT 39.5 39.1 39.6  PLT 334 336 299  APTT -- -- --  LABPROT 38.8* 37.3* 35.7*  INR 3.90* 3.71* 3.51*  HEPARINUNFRC -- -- --  CREATININE 0.79 0.83 0.97  CKTOTAL -- -- --  CKMB -- -- --  TROPONINI -- -- --   Estimated Creatinine Clearance: 65.1 ml/min (by C-G formula based on Cr of 0.79).   Medications:  Scheduled:     . azithromycin  250 mg Intravenous Q24H  . carbamazepine  200 mg Oral BID  . cefTRIAXone (ROCEPHIN)  IV  1 g Intravenous Q24H  . estradiol  2 g Vaginal 3 times weekly  . insulin aspart  0-9 Units Subcutaneous Q4H  . lamoTRIgine  25 mg Oral BID  . lisinopril  40 mg Oral Daily  . LORazepam  1 mg Oral QHS  . magic mouthwash  15 mL Oral TID  . metoprolol tartrate  50 mg Oral BID  . metronidazole  500 mg Intravenous Q8H  . mupirocin ointment  1 application Nasal BID  . potassium chloride  40 mEq Oral Daily  . DISCONTD: metoprolol  5 mg Intravenous Q6H    Assessment: 75yo female on Coumadin for A.Fib.  INR cont to increase to 3.90 this AM- reflecting Flagyl effect (being treated for C.Diff).  Hg stable, no bleeding issues noted.  Goal of Therapy:  INR 2-3   Plan:  1.  No Coumadin today 2.  Follow-up AM INR   Elwin Sleight 08/27/2011,9:25 AM

## 2011-08-27 NOTE — Progress Notes (Signed)
Physical Therapy Treatment Patient Details Name: [name] MRN: [id number] DOB: 08-07-1936 Today's Date: 08/27/2011  PT Assessment/Plan  PT - Assessment/Plan Comments on Treatment Session: Pt.'s treatment session limited today due to abnormal HR ranging 121-162 while sitting at EOB.  Patient sat at EOB for 12 minutes with variable balance and sway.  Patient with decreased attention and difficulty following commands secondary to aphasia.  Pt.'s husband was present at beginning of session and states he can provide minimal assistance at home, as he uses home O2 and ambulation is limited to 50 feet.  Discussed  possibility of SNF at discharge, which husband states is not his desire but that he will do what is best for his wife.  Recommending SNF based on today's treatment session to increase  pt's functional mobility and independence and decrease burden of care upon return to home.  PT Plan: Discharge plan needs to be updated Follow Up Recommendations: Skilled nursing facility Equipment Recommended: Defer to next venue PT Goals  Acute Rehab PT Goals Pt will go Supine/Side to Sit: with min assist PT Goal: Supine/Side to Sit - Progress: Revised (modified due to lack of progress/goal met) Pt will Sit at [facility] of Bed: with supervision;6-10 min;with no upper extremity support PT Goal: Sit at Edge Of Bed - Progress: Other (comment) Pt will go Sit to Supine/Side: with mod assist PT Goal: Sit to Supine/Side - Progress: Progressing toward goal Pt will go Sit to Stand: with mod assist PT Goal: Sit to Stand - Progress: Other (comment) (Not addressed today. ) Pt will go Stand to Sit: with mod assist PT Goal: Stand to Sit - Progress: Other (comment) (Not addressed today. ) PT Transfer Goal: Bed to Chair/Chair to Bed - Progress: Other (comment) (Not addressed today. ) PT Goal: Ambulate - Progress: Other (comment) (Not addressed today. ) PT Goal: Up/Down Stairs - Progress: Other (comment) (Not addressed  today. )  PT Treatment Precautions/Restrictions  Precautions Precautions: Fall Required Braces or Orthoses: No Restrictions Weight Bearing Restrictions: No Mobility (including Balance) Bed Mobility Supine to Sit: 2: Max assist;HOB elevated (Comment degrees) (25 degrees) Supine to Sit Details (indicate cue type and reason): Verbal and tactile cueing to bring legs to EOB and assist to trunk for supine to sit.  Sit to Supine - Left: 2: Max assist;HOB flat Sit to Supine - Left Details (indicate cue type and reason): Patient able to control trunk on descent with minimal assistance, however requiring max assist to bring lower extremities onto bed.  Scooting to [facility]: 1: +2 Total assist Transfers Transfers: No (Unable to perform secondary to dizziness and a-fib) Ambulation/Gait Ambulation/Gait: No  Balance Balance Assessed: Yes Static Sitting Balance Static Sitting - Balance Support: Feet supported;Right upper extremity supported;Left upper extremity supported (Patient swaying right to left. ) Static Sitting - Level of Assistance: 4: Min assist Static Sitting - Comment/# of Minutes: Sat at EOB for 12 minutes with variable sway to right and left.  Pt. occassionally able to maintain balance in midline but requiring verbal and tactile cueing to do so.  Exercise  General Exercises - Lower Extremity Long Arc Quad: AROM;Both;5 reps;Seated End of Session PT - End of Session Activity Tolerance: Patient limited by fatigue;Treatment limited secondary to medical complications (Comment) (A-fib with HR ranging 121-162 at EOB. ) Patient left: in bed;with call bell in reach Nurse Communication: Mobility status for transfers General Behavior During Session: [facility] for tasks performed Cognition: Impaired Cognitive Impairment: Impaired attention and difficulty following commands, pt. with history  of aphasia.   Laney Pastor, SPT  08/27/2011, 3:25 PM

## 2011-08-27 NOTE — Consult Note (Signed)
[facility] Consultation  Referring Provider: [facility] Primary Care Physician:  Gweneth Dimitri, MD, MD Primary Gastroenterologist:   Melvia Heaps, MD Reason for Consultation:  Odynophagia  HPI: [name] is a 75 y.o. female with a history of atrial fibrillation on chronic Coumadin, CVA with aphasia, seizures, HTN and GERDwho has been under our care as outpatient for evaluation of nausea. She also has a history of GERD for which she has been on PPI therapy. Several days ago patient was admitted with fever, diarrhea and lethargy. Her C-diff PCR was positive, she is on Flagyl. Her hospital stay has been complicated by acute respiratory failure secondary to fluid overload. This admission patient began having problems with coughing during PO trials. A CXR was c/w PNA, probably aspiration. Patient on a nectar thick diet and tolerating it without cough or evidence of odynophagia per nurse who feeds her. Patient has been on Magic mouthwash for oral candidiasis this admission.   Past Medical History  Diagnosis Date  . Depressive disorder, not elsewhere classified   . Atrial fibrillation   . Unspecified transient cerebral ischemia   . Unspecified cerebral artery occlusion with cerebral infarction   . Unspecified essential hypertension   . Diverticulosis of colon (without mention of hemorrhage)   . Stricture and stenosis of esophagus   . Seizure disorder   . GERD (gastroesophageal reflux disease)   . IBS (irritable bowel syndrome)     Past Surgical History  Procedure Date  . Hemorrhoid surgery   . Benign braintumor     Removal    Prior to Admission medications   Medication Sig Start Date End Date Taking? Authorizing Provider  amLODipine (NORVASC) 10 MG tablet Take 10 mg by mouth daily.    Yes Historical Provider, MD  carbamazepine (TEGRETOL) 200 MG tablet Take 200 mg by mouth 2 (two) times daily.     Yes Historical Provider, MD  cholecalciferol (VITAMIN D) 1000  UNITS tablet Take 4,000 Units by mouth daily.     Yes Historical Provider, MD  conjugated estrogens (PREMARIN) vaginal cream Place 30 g vaginally daily.     Yes Historical Provider, MD  estradiol (ESTRACE) 0.1 MG/GM vaginal cream Place 2 g vaginally 3 (three) times a week.     Yes Historical Provider, MD  hyoscyamine (LEVSIN SL) 0.125 MG SL tablet Place 0.125 mg under the tongue every 4 (four) hours as needed. For stomach cramps    Yes Historical Provider, MD  lamoTRIgine (LAMICTAL) 25 MG tablet Take 25 mg by mouth 2 (two) times daily.     Yes Historical Provider, MD  lisinopril (PRINIVIL,ZESTRIL) 40 MG tablet Take 40 mg by mouth daily.    Yes Historical Provider, MD  LORazepam (ATIVAN) 1 MG tablet Take 1 mg by mouth at bedtime.   06/19/11 06/18/12 Yes Louis Meckel, MD  metoprolol (LOPRESSOR) 50 MG tablet Take 50 mg by mouth 2 (two) times daily.    Yes Historical Provider, MD  ondansetron (ZOFRAN) 4 MG tablet Take 4 mg by mouth at bedtime.    Yes Historical Provider, MD  pantoprazole (PROTONIX) 40 MG tablet Take 40 mg by mouth daily.   08/01/11 07/31/12 Yes Louis Meckel, MD  saccharomyces boulardii (FLORASTOR) 250 MG capsule Take 250 mg by mouth 2 (two) times daily.     Yes Historical Provider, MD  warfarin (COUMADIN) 2 MG tablet Take 2 mg by mouth as directed. Takes( 1/2 tablet ) 1mg  on  Mon Wed and Friday then (1 tablet) 2mg   on Tues Thurs Sat and Sunday   Yes Historical Provider, MD    Current Facility-Administered Medications  Medication Dose Route Frequency Provider Last Rate Last Dose  . acetaminophen (TYLENOL) suppository 650 mg  650 mg Rectal Q6H PRN Jonny Ruiz, MD      . amLODipine (NORVASC) tablet 10 mg  10 mg Oral Daily Iskra Magick-Myers      . carbamazepine (TEGRETOL) tablet 200 mg  200 mg Oral BID Jonny Ruiz, MD   200 mg at 08/27/11 0931  . estradiol (ESTRACE) vaginal cream 2 g  2 g Vaginal 3 times weekly Jonny Ruiz, MD   2 g at 08/27/11 [phone]  . food thickener (THICK  IT) powder 227 g  227 g Oral PRN Iskra Magick-Myers      . hydrALAZINE (APRESOLINE) injection 5 mg  5 mg Intravenous Q6H PRN Rolan Lipa      . insulin aspart (novoLOG) injection 0-9 Units  0-9 Units Subcutaneous Q4H Iskra Magick-Myers   1 Units at 08/27/11 0937  . lamoTRIgine (LAMICTAL) tablet 25 mg  25 mg Oral BID Jonny Ruiz, MD   25 mg at 08/27/11 0930  . levalbuterol (XOPENEX) nebulizer solution 1.25 mg  1.25 mg Nebulization Q6H PRN Iskra Magick-Myers      . lisinopril (PRINIVIL,ZESTRIL) tablet 40 mg  40 mg Oral Daily Iskra Magick-Myers   40 mg at 08/27/11 0930  . magic mouthwash  15 mL Oral TID Iskra Magick-Myers   15 mL at 08/27/11 0930  . metoprolol (LOPRESSOR) tablet 50 mg  50 mg Oral BID Iskra Magick-Myers   50 mg at 08/27/11 0931  . metroNIDAZOLE (FLAGYL) IVPB 500 mg  500 mg Intravenous Q8H Iskra Magick-Myers   500 mg at 08/27/11 0454  . ondansetron (ZOFRAN) injection 4 mg  4 mg Intravenous Q6H PRN Jonny Ruiz, MD   4 mg at 08/27/11 1424  . pantoprazole (PROTONIX) injection 40 mg  40 mg Intravenous Q24H Iskra Magick-Myers      . DISCONTD: azithromycin (ZITHROMAX) 250 mg in dextrose 5 % 125 mL IVPB  250 mg Intravenous Q24H Iskra Magick-Myers   250 mg at 08/26/11 1117  . DISCONTD: cefTRIAXone (ROCEPHIN) 1 g in dextrose 5 % 50 mL IVPB  1 g Intravenous Q24H Iskra Magick-Myers   1 g at 08/26/11 1115  . DISCONTD: LORazepam (ATIVAN) tablet 1 mg  1 mg Oral QHS Jonny Ruiz, MD   1 mg at 08/26/11 2151  . DISCONTD: potassium chloride 20 MEQ/15ML (10%) liquid 40 mEq  40 mEq Oral Daily Iskra Magick-Myers   40 mEq at 08/27/11 0930    Allergies as of 08/20/2011 - Review Complete 08/20/2011  Allergen Reaction Noted  . Ciprofloxacin  08/15/2009  . Penicillins Hives     Family History  Problem Relation Age of Onset  . Heart disease Mother   . Colon cancer Neg Hx     History   Social History  . Marital Status: Married    Spouse Name: N/A    Number of Children: 2  .  Years of Education: N/A   Occupational History  . Retired    Social History Main Topics  . Smoking status: Never Smoker   . Smokeless tobacco: Never Used  . Alcohol Use: No  . Drug Use: No  . Sexually Active: Not on file   Other Topics Concern  . Not on file   Social History Narrative  . No narrative on file    Review of Systems: Not obtained. I  have seen this patient in the office, she has a history of CVA /aphasia, ROS would possibly be unreliable  Physical Exam: Vital signs in last 24 hours: Temp:  [97.3 F (36.3 C)-98.8 F (37.1 C)] 97.3 F (36.3 C) (12/03 1200) Pulse Rate:  [66-150] 66  (12/03 1500) Resp:  [16-28] 21  (12/03 1500) BP: (147-185)/(75-118) 175/97 mmHg (12/03 1200) SpO2:  [93 %-98 %] 94 % (12/03 1500) Last BM Date: 08/26/11 General:   Alert,  well-developed, white female in NAD Head:  Normocephalic and atraumatic. Eyes:   No icterus.   Conjunctiva pink. Ears:  Normal auditory acuity. Mouth:  Tongue moist. There is a coating on tongue which is brown with some white areas. Neck:  Supple; no masses felt Lungs:  Respirations even and unlabored. Lungs clear to auscultation bilaterlly.   No wheezes, crackles, or rhonchi.  Heart:  Irregular rate of 111-121 and irregular rhythm. Abdomen:  Soft, nondistended, nontender. Normal bowel sounds. No abdominal guarding.No masses, hepatomegaly or obvious hernias noted.  Msk:  Symmetrical without gross deformities.  Extremities:  Without edema. Neurologic:  Difficulty communicating thoughts. Skin:  Intact without significant lesions or rashes. Cervical Nodes:  No significant cervical adenopathy. Psych:  Alert and cooperative.  Lab Results:  Basename 08/27/11 0500 08/26/11 0500 08/25/11 0715  WBC 15.5* 15.0* 13.3*  HGB 13.1 13.1 12.8  HCT 39.5 39.1 39.6  PLT 334 336 299   BMET  Basename 08/27/11 0500 08/26/11 0500 08/25/11 0715  NA 143 140 147*  K 3.7 3.4* 3.0*  CL 105 103 105  CO2 28 29 29   GLUCOSE 113*  112* 90  BUN 22 23 21   CREATININE 0.79 0.83 0.97  CALCIUM 9.2 9.2 9.0    Basename 08/27/11 0500 08/26/11 0500  LABPROT 38.8* 37.3*  INR 3.90* 3.71*   Impression / Plan: 63. 75 year old white female admitted with C-Diff colitis. She is on IV Flagyl. Her abdominal exam is not overly concerning and she has only had 2 stools today (loose). Agree with Flagyl and IV form probably better given recent history of nausea.  2. Dysphagia, I cannot locate MBSS at present but given cough and recent aspiration PNA I would suspect oropharyngeal dysphagia. There could be an esophageal component but difficult to know without an esophagram, patient history and /or complaints of food getting stuck. If patient is having true esophageal dysphagia possibiliites may include such things as candida esophagitis, stricture, presbyesophagus. I will contact Speech Therapy about MBSS results. If esophageal (transport) dysphagia is suspected then we may look to do upper endoscopy for further evaluation (would need to address anticoagulation first). For now, continue Magic Mouthwash, IV PPI  3. Afib, on chronic Coumadin. Her INR is high at 3.9.  4. History of CVA / aphasia   LOS: 7 days   Willette Cluster  08/27/2011, 3:18 PM  LOS: 7 days   Willette Cluster  08/27/2011, 3:15 PM

## 2011-08-27 NOTE — Consult Note (Signed)
Reason for Consult: increased confusion   CC: increased confusion  HPI: [name] is an 75 y.o. 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  Majority of the HPI was obtained from the chart as patient will follow some commands but is unable to give me any history. Per dr. Anne Hahn note in E-chart patient has history of a left frontal stroke but  also has had a tumor resection from the left frontal region as well with  a large cystic deficit and encephalomalacia in that area. Patient also has a sz history and at that time had been on minimal dose of 250 mg at night.  During that hospitalization the goal was to switch her to Vimpat.  Patient was discharged on Keppra 500 BID on June 19.  Patient again was admitted to hospital on June 20-25 2012 and was discharged on Keppra 250 BID.    Per chart patent had 1 month with nausea presumed to be due to Keppra, p/w high fevers (up to 102.9) and chills, lethargy and diarrhea x 1 day. At time of admission pt was quiet and somnolent. In H&P it states at that time her last real meal she had was on Thanksgiving day and even then she did not eat much. Patient was found to have C-diff on admission, given full course of flagyl and PNA. Patient does have  A-fib and initial INR was 2.83 on admission, while in the hospital her INR dropped to 1.74. Since admission patient was noted to only respond with "yes" or "no" answers.  While in hospital INR has currently increased to 3.51. WBC initially was 19.6 had decreased to 13.3  Neurology asked to consult due to minimal improvement in speech since admitted.     Past Medical History  Diagnosis Date  .  Depressive disorder, not elsewhere classified   . Atrial fibrillation   . Unspecified transient cerebral ischemia   . Unspecified cerebral artery occlusion with cerebral infarction   . Unspecified essential hypertension   . Diverticulosis of colon (without mention of hemorrhage)   . Stricture and stenosis of esophagus   . Seizure disorder   . GERD (gastroesophageal reflux disease)   . IBS (irritable bowel syndrome)     Past Surgical History  Procedure Date  . Hemorrhoid surgery   . Benign braintumor     Removal    Family History  Problem Relation Age of Onset  . Heart disease Mother   . Colon cancer Neg Hx     Social History:  reports that she has never smoked. She has never used smokeless tobacco. She reports that she does not drink alcohol or use illicit drugs.  Allergies  Allergen Reactions  . Ciprofloxacin     REACTION: diarrhea/yeast infection  . Penicillins Hives    Medications:  Prior to Admission:  Prescriptions prior to admission  Medication Sig Dispense Refill  . amLODipine (NORVASC) 10 MG tablet Take 10 mg by mouth daily.       . carbamazepine (TEGRETOL) 200 MG tablet Take 200 mg by mouth 2 (two) times daily.        . cholecalciferol (VITAMIN D) 1000 UNITS tablet Take 4,000 Units by mouth daily.        Marland Kitchen conjugated estrogens (PREMARIN)  vaginal cream Place 30 g vaginally daily.        Marland Kitchen estradiol (ESTRACE) 0.1 MG/GM vaginal cream Place 2 g vaginally 3 (three) times a week.        . hyoscyamine (LEVSIN SL) 0.125 MG SL tablet Place 0.125 mg under the tongue every 4 (four) hours as needed. For stomach cramps       . lamoTRIgine (LAMICTAL) 25 MG tablet Take 25 mg by mouth 2 (two) times daily.        Marland Kitchen lisinopril (PRINIVIL,ZESTRIL) 40 MG tablet Take 40 mg by mouth daily.       Marland Kitchen LORazepam (ATIVAN) 1 MG tablet Take 1 mg by mouth at bedtime.        . metoprolol (LOPRESSOR) 50 MG tablet Take 50 mg by mouth 2 (two) times daily.       . ondansetron (ZOFRAN) 4 MG tablet  Take 4 mg by mouth at bedtime.       . pantoprazole (PROTONIX) 40 MG tablet Take 40 mg by mouth daily.        Marland Kitchen saccharomyces boulardii (FLORASTOR) 250 MG capsule Take 250 mg by mouth 2 (two) times daily.        Marland Kitchen warfarin (COUMADIN) 2 MG tablet Take 2 mg by mouth as directed. Takes( 1/2 tablet ) 1mg  on  Mon Wed and Friday then (1 tablet) 2mg  on Tues Thurs Sat and Sunday       Scheduled:   . amLODipine  10 mg Oral Daily  . carbamazepine  200 mg Oral BID  . estradiol  2 g Vaginal 3 times weekly  . feeding supplement  1 Container Oral BID BM  . insulin aspart  0-9 Units Subcutaneous Q4H  . lamoTRIgine  25 mg Oral BID  . lisinopril  40 mg Oral Daily  . magic mouthwash  15 mL Oral TID  . metoprolol tartrate  50 mg Oral BID  . metronidazole  500 mg Intravenous Q8H  . pantoprazole (PROTONIX) IV  40 mg Intravenous Q24H  . DISCONTD: azithromycin  250 mg Intravenous Q24H  . DISCONTD: cefTRIAXone (ROCEPHIN)  IV  1 g Intravenous Q24H  . DISCONTD: LORazepam  1 mg Oral QHS  . DISCONTD: potassium chloride  40 mEq Oral Daily    Review of Systems - Not attainable due to patients condition    Blood pressure 175/97, pulse 66, temperature 97.3 F (36.3 C), temperature source Oral, resp. rate 21, height 5\' 6"  (1.676 m), weight 75.6 kg (166 lb 10.7 oz), SpO2 94.00%.   Neurologic Examination: Mental Status: Alert, not oriented.  answers "i do not know" to date, year and place.Marland Kitchen  She will answer yes and no clearly and is able to follow commands such as sticking her tongue out, smile, and simple commands. Cranial Nerves: II-. Visual fields grossly intact and actually tells me how many fingers I am holing up in both right and left visual fields. III/IV/VI-Extraocular movements intact.  Pupils reactive bilaterally. V/VII-Smile symmetric VIII-grossly intact IX/X-normal gag XI-bilateral shoulder shrug XII-midline tongue extension Motor: Increased tone throughout greater in right arm and leg than left.   She does show some weakness in the right upper extremity compared to all other extremities.  On exam she is able to hold both arms antigravity but shows some drift in right upper extremity.  Both legs show 4+/5.  She moves all extremities purposefully and to command. Sensory: Pinprick and light touch intact throughout, bilaterally Deep Tendon Reflexes: 2+ and symmetric throughout Plantars:  up going on the right. Down going on the left Cerebellar: Normal finger-to-nose,     Lab Results  Component Value Date/Time   CHOL 182 03/08/2011 11:27 PM    Results for orders placed during the hospital encounter of 08/20/11 (from the past 48 hour(s))  GLUCOSE, CAPILLARY     Status: Abnormal   Collection Time   08/25/11  4:43 PM      Component Value Range Comment   Glucose-Capillary 192 (*) 70 - 99 (mg/dL)    Comment 1 Documented in Chart      Comment 2 Notify RN     GLUCOSE, CAPILLARY     Status: Abnormal   Collection Time   08/25/11  7:52 PM      Component Value Range Comment   Glucose-Capillary 178 (*) 70 - 99 (mg/dL)   GLUCOSE, CAPILLARY     Status: Abnormal   Collection Time   08/25/11 11:49 PM      Component Value Range Comment   Glucose-Capillary 158 (*) 70 - 99 (mg/dL)   GLUCOSE, CAPILLARY     Status: Abnormal   Collection Time   08/26/11  3:55 AM      Component Value Range Comment   Glucose-Capillary 115 (*) 70 - 99 (mg/dL)   PROTIME-INR     Status: Abnormal   Collection Time   08/26/11  5:00 AM      Component Value Range Comment   Prothrombin Time 37.3 (*) 11.6 - 15.2 (seconds)    INR 3.71 (*) 0.00 - 1.49    CBC     Status: Abnormal   Collection Time   08/26/11  5:00 AM      Component Value Range Comment   WBC 15.0 (*) 4.0 - 10.5 (K/uL)    RBC 4.00  3.87 - 5.11 (MIL/uL)    Hemoglobin 13.1  12.0 - 15.0 (g/dL)    HCT 60.4  54.0 - 98.1 (%)    MCV 97.8  78.0 - 100.0 (fL)    MCH 32.8  26.0 - 34.0 (pg)    MCHC 33.5  30.0 - 36.0 (g/dL)    RDW 19.1  47.8 - 29.5 (%)    Platelets 336   150 - 400 (K/uL)   BASIC METABOLIC PANEL     Status: Abnormal   Collection Time   08/26/11  5:00 AM      Component Value Range Comment   Sodium 140  135 - 145 (mEq/L)    Potassium 3.4 (*) 3.5 - 5.1 (mEq/L)    Chloride 103  96 - 112 (mEq/L)    CO2 29  19 - 32 (mEq/L)    Glucose, Bld 112 (*) 70 - 99 (mg/dL)    BUN 23  6 - 23 (mg/dL)    Creatinine, Ser 6.21  0.50 - 1.10 (mg/dL)    Calcium 9.2  8.4 - 10.5 (mg/dL)    GFR calc non Af Amer 68 (*) >90 (mL/min)    GFR calc Af Amer 79 (*) >90 (mL/min)   GLUCOSE, CAPILLARY     Status: Abnormal   Collection Time   08/26/11  8:08 AM      Component Value Range Comment   Glucose-Capillary 150 (*) 70 - 99 (mg/dL)   GLUCOSE, CAPILLARY     Status: Abnormal   Collection Time   08/26/11 12:02 PM      Component Value Range Comment   Glucose-Capillary 139 (*) 70 - 99 (mg/dL)   GLUCOSE, CAPILLARY  Status: Abnormal   Collection Time   08/26/11  3:57 PM      Component Value Range Comment   Glucose-Capillary 161 (*) 70 - 99 (mg/dL)   GLUCOSE, CAPILLARY     Status: Abnormal   Collection Time   08/26/11  8:50 PM      Component Value Range Comment   Glucose-Capillary 137 (*) 70 - 99 (mg/dL)   GLUCOSE, CAPILLARY     Status: Abnormal   Collection Time   08/27/11 12:04 AM      Component Value Range Comment   Glucose-Capillary 136 (*) 70 - 99 (mg/dL)   GLUCOSE, CAPILLARY     Status: Abnormal   Collection Time   08/27/11  3:42 AM      Component Value Range Comment   Glucose-Capillary 111 (*) 70 - 99 (mg/dL)   PROTIME-INR     Status: Abnormal   Collection Time   08/27/11  5:00 AM      Component Value Range Comment   Prothrombin Time 38.8 (*) 11.6 - 15.2 (seconds)    INR 3.90 (*) 0.00 - 1.49    CBC     Status: Abnormal   Collection Time   08/27/11  5:00 AM      Component Value Range Comment   WBC 15.5 (*) 4.0 - 10.5 (K/uL)    RBC 4.02  3.87 - 5.11 (MIL/uL)    Hemoglobin 13.1  12.0 - 15.0 (g/dL)    HCT 46.9  62.9 - 52.8 (%)    MCV 98.3  78.0 - 100.0  (fL)    MCH 32.6  26.0 - 34.0 (pg)    MCHC 33.2  30.0 - 36.0 (g/dL)    RDW 41.3  24.4 - 01.0 (%)    Platelets 334  150 - 400 (K/uL)   BASIC METABOLIC PANEL     Status: Abnormal   Collection Time   08/27/11  5:00 AM      Component Value Range Comment   Sodium 143  135 - 145 (mEq/L)    Potassium 3.7  3.5 - 5.1 (mEq/L)    Chloride 105  96 - 112 (mEq/L)    CO2 28  19 - 32 (mEq/L)    Glucose, Bld 113 (*) 70 - 99 (mg/dL)    BUN 22  6 - 23 (mg/dL)    Creatinine, Ser 2.72  0.50 - 1.10 (mg/dL)    Calcium 9.2  8.4 - 10.5 (mg/dL)    GFR calc non Af Amer 81 (*) >90 (mL/min)    GFR calc Af Amer >90  >90 (mL/min)   GLUCOSE, CAPILLARY     Status: Abnormal   Collection Time   08/27/11  8:18 AM      Component Value Range Comment   Glucose-Capillary 122 (*) 70 - 99 (mg/dL)   GLUCOSE, CAPILLARY     Status: Abnormal   Collection Time   08/27/11 12:29 PM      Component Value Range Comment   Glucose-Capillary 167 (*) 70 - 99 (mg/dL)   PRO B NATRIURETIC PEPTIDE     Status: Abnormal   Collection Time   08/27/11  1:53 PM      Component Value Range Comment   BNP, POC 1611.0 (*) 0 - 125 (pg/mL)   CARDIAC PANEL(CRET KIN+CKTOT+MB+TROPI)     Status: Normal   Collection Time   08/27/11  2:00 PM      Component Value Range Comment   Total CK 21  7 - 177 (U/L)  CK, MB 1.8  0.3 - 4.0 (ng/mL)    Troponin I <0.30  <0.30 (ng/mL)    Relative Index RELATIVE INDEX IS INVALID  0.0 - 2.5     CT head 08/20/2011 IMPRESSION:  1. Postoperative changes in the left frontal lobe. 2. Significant atrophy and small vessel disease. 3. No evidence for acute intracranial abnormality.    Assessment/Plan:  76 YO female with history left frontal stroke, tumor resection in the left frontal region and SZ.  Given patients hx of Afib, flucution of INR and questionable worsening of speech and confusion cannot exclude possible acute stroke/hemorrhage or sz activity.  Patient also on Tegretol and would like to rule out Tegretol  toxicity.  Recommend: (1) CT head given supra therapeutic INR followed by MRI if no abnormalities noted (2) EEG (3) Tegretol level in AM  Dr. Thad Ranger will follow behind and make further recommendations.    Felicie Morn PA-C [facility] [phone]  08/27/2011, 3:52 PM    Patient seen and examined. I agree with the above.  Thana Farr, MD [facility] [phone]  08/27/2011  7:15 PM

## 2011-08-27 NOTE — Progress Notes (Signed)
Seen and agree with SPT note Arkin Imran Tabor, PT [phone]  

## 2011-08-27 NOTE — Progress Notes (Signed)
INITIAL ADULT NUTRITION ASSESSMENT Date: 08/27/2011   Time: 11:20 AM Reason for Assessment: dysphagia  ASSESSMENT: Female 75 y.o.  Dx: PNA (pneumonia), a fib, C. Diff, hypokalemia, hypomagnesemia, CHF, dysphagia  Hx: diverticulosis and IBS, depression, A Fib, TIA, HTN, stricture and stenosis of esophagus, seizure d/o, GERD, IBS, benign brain tumor removal  Related Meds: zithromax, tegretol, rocephin, estrace, novolog, magic mouthwash, flagyl, KCL  Ht: 5\' 6"  (167.6 cm)  Wt: 166.7# (75.6kg)  Ideal Wt: 59.3 kg  % Ideal Wt: 127  Usual Wt: unknown % Usual Wt: unknown  Body mass index is 26.90 kg/(m^2).  Food/Nutrition Related Hx: Last meal PTA was on Thanksgiving day and did not eat much.  Pt unable to state when her last good meal was.  Labs:  CMP     Component Value Date/Time   NA 143 08/27/2011 0500   K 3.7 08/27/2011 0500   CL 105 08/27/2011 0500   CO2 28 08/27/2011 0500   GLUCOSE 113* 08/27/2011 0500   BUN 22 08/27/2011 0500   CREATININE 0.79 08/27/2011 0500   CALCIUM 9.2 08/27/2011 0500   PROT 6.2 08/21/2011 0520   ALBUMIN 2.7* 08/21/2011 0520   AST 16 08/21/2011 0520   ALT 12 08/21/2011 0520   ALKPHOS 66 08/21/2011 0520   BILITOT 0.7 08/21/2011 0520   GFRNONAA 81* 08/27/2011 0500   GFRAA >90 08/27/2011 0500     I/O last 3 completed shifts: In: 1340 [P.O.:840; IV Piggyback:500] Out: 953 [Urine:950; Stool:3] Total I/O In: 240 [P.O.:240] Out: 351 [Urine:350; Stool:1]   Diet Order: Dysphagia 1 Nectar thick liquids  Supplements/Tube Feeding:  none  IVF:    Estimated Nutritional Needs:per day   Kcal: 1600-1700 Protein: 70-80 Fluid: >1.6L  Pt reports very poor appetite and intake.  Poor po for greater than 2 weeks.  At risk for malnutrition.  NUTRITION DIAGNOSIS: -Inadequate oral intake (NI-2.1).  Status: Ongoing  RELATED TO: Poor appetite  AS EVIDENCE BY: reported and observed poor intake  MONITORING/EVALUATION(Goals): Intake >75% meals and supplements  to meet estimated needs.  EDUCATION NEEDS: -No education needs identified at this time  INTERVENTION: 1.  Encouraged po 2.  Assistance with meals 3.  Ensure pudding bid  Dietitian #:[phone]  DOCUMENTATION CODES Per approved criteria  -Not Applicable    Jeoffrey Massed 08/27/2011, 11:20 AM

## 2011-08-27 NOTE — Progress Notes (Signed)
Utilization Review Completed.[name] T12/11/2010   

## 2011-08-27 NOTE — Progress Notes (Signed)
Patient ID: [name], female   DOB: 1936/06/12, 75 y.o.   MRN: [id number]  Subjective: No events overnight. Patient denies chest pain, shortness of breath, abdominal pain. Had bowel movement and reports ambulating.  Objective:  Vital signs in last 24 hours:  Filed Vitals:   08/27/11 1000 08/27/11 1100 08/27/11 1200 08/27/11 1300  BP:   175/97   Pulse: 80 86 96 94  Temp:   97.3 F (36.3 C)   TempSrc:   Oral   Resp: 20 20 21 16   Height:      Weight:      SpO2: 94% 98% 95% 96%   Intake/Output from previous day:  Intake/Output Summary (Last 24 hours) at 08/27/11 1331 Last data filed at 08/27/11 1200  Gross per 24 hour  Intake   1140 ml  Output   1155 ml  Net    -15 ml    Physical Exam: General: Alert, awake, oriented to name, in no acute distress. HEENT: No bruits, no goiter. Moist mucous membranes, no scleral icterus, no conjunctival pallor. Heart: Regular rate and rhythm, without murmurs, rubs, gallops. Lungs: Clear to auscultation bilaterally. No wheezing, no rhonchi, no rales.  Abdomen: Soft, nontender, nondistended, positive bowel sounds. Extremities: No clubbing cyanosis or edema,  positive pedal pulses. Neuro: More dysarthria compared to yesterday otherwise non focal exam, follows commands appropriately   Lab Results:  Basic Metabolic Panel:    Component Value Date/Time   NA 143 08/27/2011 0500   K 3.7 08/27/2011 0500   CL 105 08/27/2011 0500   CO2 28 08/27/2011 0500   BUN 22 08/27/2011 0500   CREATININE 0.79 08/27/2011 0500   GLUCOSE 113* 08/27/2011 0500   CALCIUM 9.2 08/27/2011 0500   CBC:    Component Value Date/Time   WBC 15.5* 08/27/2011 0500   HGB 13.1 08/27/2011 0500   HCT 39.5 08/27/2011 0500   PLT 334 08/27/2011 0500   MCV 98.3 08/27/2011 0500   NEUTROABS 14.1* 08/20/2011 1451   LYMPHSABS 0.5* 08/20/2011 1451   MONOABS 1.6* 08/20/2011 1451   EOSABS 0.0 08/20/2011 1451   BASOSABS 0.0 08/20/2011 1451     Lab 08/27/11 0500 08/26/11 0500 08/25/11  0715 08/24/11 0510 08/23/11 0620 08/20/11 1451  WBC 15.5* 15.0* 13.3* 14.7* 19.6* --  HGB 13.1 13.1 12.8 12.1 11.7* --  HCT 39.5 39.1 39.6 36.3 35.1* --  PLT 334 336 299 291 266 --  MCV 98.3 97.8 97.3 95.5 95.9 --  MCH 32.6 32.8 31.4 31.8 32.0 --  MCHC 33.2 33.5 32.3 33.3 33.3 --  RDW 14.3 14.5 14.4 14.3 14.1 --  LYMPHSABS -- -- -- -- -- 0.5*  MONOABS -- -- -- -- -- 1.6*  EOSABS -- -- -- -- -- 0.0  BASOSABS -- -- -- -- -- 0.0  BANDABS -- -- -- -- -- --    Lab 08/27/11 0500 08/26/11 0500 08/25/11 0715 08/24/11 0510 08/23/11 0620 08/22/11 0235  NA 143 140 147* 142 140 --  K 3.7 3.4* 3.0* 3.0* 3.2* --  CL 105 103 105 103 103 --  CO2 28 29 29 26 25  --  GLUCOSE 113* 112* 90 164* 108* --  BUN 22 23 21 18 19  --  CREATININE 0.79 0.83 0.97 1.00 0.99 --  CALCIUM 9.2 9.2 9.0 8.5 8.7 --  MG -- -- -- 1.6 2.3 1.5    Lab 08/27/11 0500 08/26/11 0500 08/25/11 0715 08/24/11 0510 08/23/11 0620  INR 3.90* 3.71* 3.51* 3.23* 2.83*  PROTIME -- -- -- -- --  Cardiac markers: No results found for this basename: CK:3,CKMB:3,TROPONINI:3,MYOGLOBIN:3 in the last 168 hours  Lab 08/22/11 0238  POCBNP 2548.0*   Recent Results (from the past 240 hour(s))  URINE CULTURE     Status: Normal   Collection Time   08/20/11  3:23 PM      Component Value Range Status Comment   Specimen Description URINE, CLEAN CATCH   Final    Special Requests NONE   Final    Setup Time [id number]   Final    Colony Count 20,OOO COLONIES/ML   Final    Culture ENTEROCOCCUS SPECIES   Final    Report Status 08/24/2011 FINAL   Final    Organism ID, Bacteria ENTEROCOCCUS SPECIES   Final   CULTURE, BLOOD (ROUTINE X 2)     Status: Normal   Collection Time   08/21/11  1:00 AM      Component Value Range Status Comment   Specimen Description BLOOD LEFT ARM   Final    Special Requests BOTTLES DRAWN AEROBIC AND ANAEROBIC 10CC EA   Final    Setup Time [id number]   Final    Culture NO GROWTH 5 DAYS   Final    Report Status  08/27/2011 FINAL   Final   CULTURE, BLOOD (ROUTINE X 2)     Status: Normal   Collection Time   08/21/11  1:15 AM      Component Value Range Status Comment   Specimen Description BLOOD LEFT WRIST   Final    Special Requests BOTTLES DRAWN AEROBIC AND ANAEROBIC 10CC EA   Final    Setup Time [id number]   Final    Culture NO GROWTH 5 DAYS   Final    Report Status 08/27/2011 FINAL   Final   CLOSTRIDIUM DIFFICILE BY PCR     Status: Abnormal   Collection Time   08/21/11  8:31 AM      Component Value Range Status Comment   C difficile by pcr POSITIVE (*) NEGATIVE  Final   STOOL CULTURE     Status: Normal   Collection Time   08/21/11  8:32 AM      Component Value Range Status Comment   Specimen Description STOOL   Final    Special Requests NONE   Final    Culture     Final    Value: NO SALMONELLA, SHIGELLA, CAMPYLOBACTER, OR YERSINIA ISOLATED   Report Status 08/25/2011 FINAL   Final   MRSA PCR SCREENING     Status: Abnormal   Collection Time   08/22/11  3:25 AM      Component Value Range Status Comment   MRSA by PCR POSITIVE (*) NEGATIVE  Final     Studies/Results: No results found.  Medications: Scheduled Meds:   . azithromycin  250 mg Intravenous Q24H  . carbamazepine  200 mg Oral BID  . cefTRIAXone (ROCEPHIN)  IV  1 g Intravenous Q24H  . estradiol  2 g Vaginal 3 times weekly  . insulin aspart  0-9 Units Subcutaneous Q4H  . lamoTRIgine  25 mg Oral BID  . lisinopril  40 mg Oral Daily  . LORazepam  1 mg Oral QHS  . magic mouthwash  15 mL Oral TID  . metoprolol tartrate  50 mg Oral BID  . metronidazole  500 mg Intravenous Q8H  . potassium chloride  40 mEq Oral Daily  . DISCONTD: metoprolol  5 mg Intravenous Q6H   Continuous Infusions:  PRN Meds:.acetaminophen, food thickener,  hydrALAZINE, levalbuterol, ondansetron (ZOFRAN) IV  Assessment/Plan:  Principal Problem:  *PNA (pneumonia)  - d/c abx today, she has completed 5 days course with Zithromax and Rocephin - pt  maintaining oxygen saturations above 95% on RA and clinically improving   Active Problems:  HYPERTENSION  - well controlled with few hypertensive episodes  - continue metoprolol for now   FIBRILLATION, ATRIAL  - heart rate uncontrolled  - HR goes up on occasions but pt remains asymptomatic  - continue metoprolol but increase the dose to her home medication dose - holding coumadin due to INR supratherapeutic  GERD (gastroesophageal reflux disease)  - continue protonix - GI consult obtain, follow up onrecommendations  Diarrhea of infectious origin  - secondary to C. Diff colitis  - continue antibiotic Flagyl, may need to change to Vanc given interaction with coumadin - follow up on recommendations  Hypokalemia  - resolved  Hypomagnesemia  - supplemented   Diastolic CHF, acute  - clinically compensated  - obtain follow up CXR  Dysphagia  - pt still reports dysphagia  - call GI to get further recommendations  - pt follows with Dr. Arlyce Dice in an outpatient setting  Dysarthria - worsening and unclear etiology, pt has some dysarthria at baseline but this is worse now - obtained neuro consult     LOS: 7 days   MAGICK-Tateanna Bach 08/27/2011, 1:31 PM

## 2011-08-28 ENCOUNTER — Inpatient Hospital Stay (HOSPITAL_COMMUNITY): Payer: Medicare Other

## 2011-08-28 ENCOUNTER — Ambulatory Visit (HOSPITAL_COMMUNITY): Payer: Medicare Other

## 2011-08-28 DIAGNOSIS — A0472 Enterocolitis due to Clostridium difficile, not specified as recurrent: Secondary | ICD-10-CM

## 2011-08-28 DIAGNOSIS — R110 Nausea: Secondary | ICD-10-CM

## 2011-08-28 LAB — BASIC METABOLIC PANEL WITH GFR
BUN: 19 mg/dL (ref 6–23)
CO2: 25 meq/L (ref 19–32)
Calcium: 9 mg/dL (ref 8.4–10.5)
Chloride: 105 meq/L (ref 96–112)
Creatinine, Ser: 0.78 mg/dL (ref 0.50–1.10)
GFR calc Af Amer: >90 mL/min
GFR calc non Af Amer: 81 mL/min — ABNORMAL LOW
Glucose, Bld: 97 mg/dL (ref 70–99)
Potassium: 3.9 meq/L (ref 3.5–5.1)
Sodium: 141 meq/L (ref 135–145)

## 2011-08-28 LAB — PROTIME-INR
INR: 3.19 — ABNORMAL HIGH (ref 0.00–1.49)
Prothrombin Time: 33.2 seconds — ABNORMAL HIGH (ref 11.6–15.2)

## 2011-08-28 LAB — CARDIAC PANEL(CRET KIN+CKTOT+MB+TROPI)
CK, MB: 1.9 ng/mL (ref 0.3–4.0)
Relative Index: INVALID (ref 0.0–2.5)
Relative Index: INVALID (ref 0.0–2.5)
Total CK: 21 U/L (ref 7–177)
Total CK: 25 U/L (ref 7–177)
Troponin I: <0.3 ng/mL

## 2011-08-28 LAB — CBC
Hemoglobin: 13.8 g/dL (ref 12.0–15.0)
MCH: 32.4 pg (ref 26.0–34.0)
Platelets: 366 10*3/uL (ref 150–400)
RBC: 4.26 MIL/uL (ref 3.87–5.11)
WBC: 17.3 10*3/uL — ABNORMAL HIGH (ref 4.0–10.5)

## 2011-08-28 LAB — GLUCOSE, CAPILLARY
Glucose-Capillary: 153 mg/dL — ABNORMAL HIGH (ref 70–99)
Glucose-Capillary: 120 mg/dL — ABNORMAL HIGH (ref 70–99)
Glucose-Capillary: 123 mg/dL — ABNORMAL HIGH (ref 70–99)
Glucose-Capillary: 119 mg/dL — ABNORMAL HIGH (ref 70–99)

## 2011-08-28 LAB — CARBAMAZEPINE LEVEL, TOTAL: Carbamazepine Lvl: 7.1 ug/mL (ref 4.0–12.0)

## 2011-08-28 MED ORDER — WHITE PETROLATUM GEL
Status: AC
  Administered 2011-08-28: 07:00:00
  Filled 2011-08-28: qty 5

## 2011-08-28 MED ORDER — HYDRALAZINE HCL 20 MG/ML IJ SOLN
10.0000 mg | Freq: Four times a day (QID) | INTRAMUSCULAR | Status: DC | PRN
  Administered 2011-08-29 – 2011-08-30 (×2): 10 mg via INTRAVENOUS
  Filled 2011-08-28: qty 0.5

## 2011-08-28 MED ORDER — FLUCONAZOLE 100MG IVPB
100.0000 mg | INTRAVENOUS | Status: DC
  Administered 2011-08-28 – 2011-08-30 (×3): 100 mg via INTRAVENOUS
  Filled 2011-08-28 (×6): qty 50

## 2011-08-28 MED ORDER — WARFARIN 0.5 MG HALF TABLET
0.5000 mg | ORAL_TABLET | Freq: Once | ORAL | Status: AC
  Administered 2011-08-28: 0.5 mg via ORAL
  Filled 2011-08-28: qty 1

## 2011-08-28 NOTE — Consult Note (Signed)
I agree with the above documentation, including the assessment and plan. Attempt to located MBSS report.  Unclear if esophageal dysphagia. Treating c diff.

## 2011-08-28 NOTE — Progress Notes (Signed)
CSW completed assessment and placed in chart with FL2. CSW faxed out to [employer]. CSW will continue to follow. [facility] [phone]

## 2011-08-28 NOTE — Progress Notes (Signed)
Speech Language/Pathology  Speech Pathology: Dysphagia Treatment Note  Patient was observed with : Pureed and Nectar liquids.  Patient was noted to have s/s of aspiration : Yes:  Intermittent throat clearing  Lung Sounds: diminished Temperature: 97.4  Patient required: Minimal verbal cues to initiate swallow response as pt was occasionally distracted by environment and attempted to initiate speech with bolus in oropharynx.  Conducted therapy with GI MD also at bedside.  Pt denied odynophagia but family reports history of globus following meals.  Currently pt readily accepts liquids but refuses most solids.  Family also reports vomiting after consumption of magic cup. Oral hygiene has moderately improved though oral candidiasis is still present.  Alertness is much improved.   Clinical Impression: Pt continues to present with multifactorial dysphagia. Oral hygiene is improving, cognition is also improving but continues to be a factor in safe PO consumption.  Suspect an esophageal component, GI is following and recommending a Barium swallow to assess esophagus. At this time, recommend pt continue current diet. SLP will follow for potential for upgrade and need to address compensatory strategies given results of GI assessment.     Pain:   none Intervention Required:   No   Goals: Goals Partially Met

## 2011-08-28 NOTE — Progress Notes (Signed)
ANTICOAGULATION CONSULT NOTE - Follow Up Consult  Pharmacy Consult for Coumadin Indication:  A.Fib  Allergies  Allergen Reactions  . Ciprofloxacin     REACTION: diarrhea/yeast infection  . Penicillins Hives    Patient Measurements: Height: 5\' 6"  (167.6 cm) Weight: 166 lb 10.7 oz (75.6 kg) IBW/kg (Calculated) : 59.3    Vital Signs: Temp: 97.4 F (36.3 C) (12/04 0411) Temp src: Oral (12/04 0411) BP: 168/87 mmHg (12/04 0411) Pulse Rate: 114  (12/04 0700)  Labs:  Basename 08/28/11 0700 08/28/11 0034 08/27/11 1400 08/27/11 0500 08/26/11 0500  HGB 13.8 -- -- 13.1 --  HCT 41.6 -- -- 39.5 39.1  PLT 366 -- -- 334 336  APTT -- -- -- -- --  LABPROT 33.2* -- -- 38.8* 37.3*  INR 3.19* -- -- 3.90* 3.71*  HEPARINUNFRC -- -- -- -- --  CREATININE 0.78 -- -- 0.79 0.83  CKTOTAL 25 21 21  -- --  CKMB 1.9 1.7 1.8 -- --  TROPONINI <0.30 <0.30 <0.30 -- --   Estimated Creatinine Clearance: 65.1 ml/min (by C-G formula based on Cr of 0.78).   Medications:  Scheduled:     . amLODipine  10 mg Oral Daily  . carbamazepine  200 mg Oral BID  . estradiol  2 g Vaginal 3 times weekly  . feeding supplement  1 Container Oral BID BM  . fluconazole (DIFLUCAN) IV  100 mg Intravenous Q24H  . insulin aspart  0-9 Units Subcutaneous Q4H  . lamoTRIgine  25 mg Oral BID  . lisinopril  40 mg Oral Daily  . magic mouthwash  15 mL Oral TID  . metoprolol tartrate  50 mg Oral BID  . metronidazole  500 mg Intravenous Q8H  . pantoprazole (PROTONIX) IV  40 mg Intravenous Q24H  . white petrolatum      . DISCONTD: azithromycin  250 mg Intravenous Q24H  . DISCONTD: cefTRIAXone (ROCEPHIN)  IV  1 g Intravenous Q24H  . DISCONTD: LORazepam  1 mg Oral QHS  . DISCONTD: potassium chloride  40 mEq Oral Daily    Assessment: 75yo female on Coumadin for A.Fib.  INR trending down at 3.19 this AM.   Hg stable, no bleeding issues noted.  Goal of Therapy:  INR 2-3   Plan:  1.  Coumadin 0.5 mg po x 1 dose today 2.   Follow-up AM INR   Elwin Sleight 08/28/2011,9:15 AM

## 2011-08-28 NOTE — Progress Notes (Signed)
Daughter Selena Batten discussed patient excoriated areas to buttocks and sacral area, yeast in that area as well as yeast in patients mouth. Advised her of wound care consult for this and order received for Diflucan IV and that patient had received dose already this morning. Daughter verbalized understanding and stated that her mom has had problems with yeast infections in the past. Stated she was happy that we were getting the consult and the medications. Stated she was very happy with her nursing care and thanked my for my care.

## 2011-08-28 NOTE — Progress Notes (Addendum)
Patient ID: [name], female   DOB: 04/21/1936, 75 y.o.   MRN: [id number]  Subjective: No events overnight. Patient denies chest pain, shortness of breath, abdominal pain.   Objective:  Vital signs in last 24 hours:  Filed Vitals:   08/28/11 1045 08/28/11 1100 08/28/11 1242 08/28/11 1245  BP:   166/94   Pulse:   89 97  Temp:   98.9 F (37.2 C)   TempSrc:   Oral   Resp: 20 18 17 17   Height:      Weight:      SpO2:   97% 97%    Intake/Output from previous day:   Intake/Output Summary (Last 24 hours) at 08/28/11 1434 Last data filed at 08/28/11 1254  Gross per 24 hour  Intake    950 ml  Output    950 ml  Net      0 ml    Physical Exam: General: Alert, awake, follows commands appropriately, in no acute distress. HEENT: No bruits, no goiter. Moist mucous membranes, no scleral icterus, no conjunctival pallor. Heart: Irregular rate and rhythm, without murmurs, rubs, gallops. Lungs: Clear to auscultation bilaterally with minimal bibasilar crackles. No wheezing, no rhonchi, no rales.  Abdomen: Soft, nontender, nondistended, positive bowel sounds. Extremities: No clubbing cyanosis or edema,  positive pedal pulses.  Lab Results:  Basic Metabolic Panel:    Component Value Date/Time   NA 141 08/28/2011 0700   K 3.9 08/28/2011 0700   CL 105 08/28/2011 0700   CO2 25 08/28/2011 0700   BUN 19 08/28/2011 0700   CREATININE 0.78 08/28/2011 0700   GLUCOSE 97 08/28/2011 0700   CALCIUM 9.0 08/28/2011 0700   CBC:    Component Value Date/Time   WBC 17.3* 08/28/2011 0700   HGB 13.8 08/28/2011 0700   HCT 41.6 08/28/2011 0700   PLT 366 08/28/2011 0700   MCV 97.7 08/28/2011 0700   NEUTROABS 14.1* 08/20/2011 1451   LYMPHSABS 0.5* 08/20/2011 1451   MONOABS 1.6* 08/20/2011 1451   EOSABS 0.0 08/20/2011 1451   BASOSABS 0.0 08/20/2011 1451      Lab 08/28/11 0700 08/27/11 0500 08/26/11 0500 08/25/11 0715 08/24/11 0510  WBC 17.3* 15.5* 15.0* 13.3* 14.7*  HGB 13.8 13.1 13.1 12.8 12.1  HCT  41.6 39.5 39.1 39.6 36.3  PLT 366 334 336 299 291  MCV 97.7 98.3 97.8 97.3 95.5  MCH 32.4 32.6 32.8 31.4 31.8  MCHC 33.2 33.2 33.5 32.3 33.3  RDW 14.0 14.3 14.5 14.4 14.3  LYMPHSABS -- -- -- -- --  MONOABS -- -- -- -- --  EOSABS -- -- -- -- --  BASOSABS -- -- -- -- --  BANDABS -- -- -- -- --    Lab 08/28/11 0700 08/27/11 0500 08/26/11 0500 08/25/11 0715 08/24/11 0510 08/23/11 0620 08/22/11 0235  NA 141 143 140 147* 142 -- --  K 3.9 3.7 3.4* 3.0* 3.0* -- --  CL 105 105 103 105 103 -- --  CO2 25 28 29 29 26  -- --  GLUCOSE 97 113* 112* 90 164* -- --  BUN 19 22 23 21 18  -- --  CREATININE 0.78 0.79 0.83 0.97 1.00 -- --  CALCIUM 9.0 9.2 9.2 9.0 8.5 -- --  MG -- -- -- -- 1.6 2.3 1.5    Lab 08/28/11 0700 08/27/11 0500 08/26/11 0500 08/25/11 0715 08/24/11 0510  INR 3.19* 3.90* 3.71* 3.51* 3.23*  PROTIME -- -- -- -- --   Cardiac markers:  Lab 08/28/11 0700 08/28/11  0034 2011-09-08 1400  CKMB 1.9 1.7 1.8  TROPONINI <0.30 <0.30 <0.30  MYOGLOBIN -- -- --    Lab 09/08/2011 1353 08/22/11 0238  POCBNP 1611.0* 2548.0*   Recent Results (from the past 240 hour(s))  URINE CULTURE     Status: Normal   Collection Time   08/20/11  3:23 PM      Component Value Range Status Comment   Specimen Description URINE, CLEAN CATCH   Final    Special Requests NONE   Final    Setup Time [id number]   Final    Colony Count 20,OOO COLONIES/ML   Final    Culture ENTEROCOCCUS SPECIES   Final    Report Status 08/24/2011 FINAL   Final    Organism ID, Bacteria ENTEROCOCCUS SPECIES   Final   CULTURE, BLOOD (ROUTINE X 2)     Status: Normal   Collection Time   08/21/11  1:00 AM      Component Value Range Status Comment   Specimen Description BLOOD LEFT ARM   Final    Special Requests BOTTLES DRAWN AEROBIC AND ANAEROBIC 10CC EA   Final    Setup Time [id number]   Final    Culture NO GROWTH 5 DAYS   Final    Report Status 08-Sep-2011 FINAL   Final   CULTURE, BLOOD (ROUTINE X 2)     Status: Normal    Collection Time   08/21/11  1:15 AM      Component Value Range Status Comment   Specimen Description BLOOD LEFT WRIST   Final    Special Requests BOTTLES DRAWN AEROBIC AND ANAEROBIC 10CC EA   Final    Setup Time [id number]   Final    Culture NO GROWTH 5 DAYS   Final    Report Status 08-Sep-2011 FINAL   Final   CLOSTRIDIUM DIFFICILE BY PCR     Status: Abnormal   Collection Time   08/21/11  8:31 AM      Component Value Range Status Comment   C difficile by pcr POSITIVE (*) NEGATIVE  Final   STOOL CULTURE     Status: Normal   Collection Time   08/21/11  8:32 AM      Component Value Range Status Comment   Specimen Description STOOL   Final    Special Requests NONE   Final    Culture     Final    Value: NO SALMONELLA, SHIGELLA, CAMPYLOBACTER, OR YERSINIA ISOLATED   Report Status 08/25/2011 FINAL   Final   MRSA PCR SCREENING     Status: Abnormal   Collection Time   08/22/11  3:25 AM      Component Value Range Status Comment   MRSA by PCR POSITIVE (*) NEGATIVE  Final     Studies/Results: Dg Chest 2 View 09-08-2011  IMPRESSION: Minimal residual atelectasis at the lung bases.  Normal vascularity.   Ct Head Wo Contrast 2011/09/08  IMPRESSION:  1.  No acute intracranial pathology seen on CT. 2.  Postoperative changes at the left frontal lobe. 3.  Diffuse small vessel ischemic microangiopathy and small chronic lacunar infarcts within the basal ganglia.    Mr Brain Wo Contrast 08/28/2011  IMPRESSION: Atrophy and small vessel disease.  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  No acute intracranial findings.  Chronic occlusion left internal carotid artery.    Medications: Scheduled Meds:   . amLODipine  10 mg Oral Daily  . carbamazepine  200 mg  Oral BID  . estradiol  2 g Vaginal 3 times weekly  . feeding supplement  1 Container Oral BID BM  . fluconazole (DIFLUCAN) IV  100 mg Intravenous Q24H  . insulin aspart  0-9 Units Subcutaneous Q4H  .  lamoTRIgine  25 mg Oral BID  . lisinopril  40 mg Oral Daily  . magic mouthwash  15 mL Oral TID  . metoprolol tartrate  50 mg Oral BID  . metronidazole  500 mg Intravenous Q8H  . pantoprazole (PROTONIX) IV  40 mg Intravenous Q24H  . warfarin  0.5 mg Oral ONCE-1800  . white petrolatum       Continuous Infusions:  PRN Meds:.acetaminophen, food thickener, hydrALAZINE, levalbuterol, ondansetron (ZOFRAN) IV  Assessment/Plan:  Principal Problem:  *PNA (pneumonia)  - she has completed 5 days course with Zithromax and Rocephin  - pt maintaining oxygen saturations above 95% on RA and clinically improving   Active Problems:  HYPERTENSION  - well controlled with few hypertensive episodes  - continue metoprolol, Amlodipine, and Hydralazine PRN  FIBRILLATION, ATRIAL  - heart rate currently controlled - HR goes up on occasions but pt remains asymptomatic, cardiac enzymes x 3 sets are negative and no events on telemetry notes, no acute change on EKG - continue metoprolol at a current dose - holding coumadin due to INR supratherapeutic  - called cardiology consult for further recommendations, Flagyl IV interferes with coumadin metabolism and the question is what to do next in terms of anticoagulation (hold coumadin until completing the therapy, we are trying to avoid PO med's given dysphagia but may be able to switch to Vancomycin suspension) - I spoke with Dr. Mayford Knife who was on call and she has reviewed the EKG and blood work, she recommends holding off on coumadin for now and once INR < 2 ask pharmacy to start heparin and transition to coumadin - she was fine with continuing Flagyl IV and no vit K to be given at this time unless pt need to have therapeutic EGD - pt follows with Dr. [name]  GERD (gastroesophageal reflux disease)  - continue protonix  - GI consult obtain, follow up on recommendations  - esophagogram today - pt may need EGD but therapeutic EGD will not be possible unless INR  is stable  Diarrhea of infectious origin  - secondary to C. Diff colitis  - continue antibiotic Flagyl, recommendation by GI was to try to switch to Vancomycin suspension QID but that is not available at the moment - I would not give IV vancomycin at this time, PO vancomycin would be a good option but we are currently trying to minimize PO medications until esophagogram results are back  Hypokalemia  - resolved   Hypomagnesemia  - supplemented   Diastolic CHF, acute  - clinically compensated   Dysphagia  - pt still reports dysphagia  - pt follows with Dr. Arlyce Dice in an outpatient setting  - follow up on esophagogram results - continue IV protonix  Dysarthria and Lethargy - slightly improving compared to yesterday - no acute findings on CT or MRI of the head - EEG planned - neurology following but will sign off today since no acute events identified to explain pt's lethargy - I do think this most likely secondary to progressive deconditioning and inability to advance her diet and proceed with PT   Disposition  - PT/OT has recommended SNF upon discharge - this was discussed with son but he reported that they are not ready for this  conversation at this time - we will readdress the issue in the AM as well - no family member at the bedside today to discuss the plan and management, will attempt phone call   LOS: 8 days   MAGICK-MYERS, ISKRA 08/28/2011, 2:34 PM

## 2011-08-28 NOTE — Progress Notes (Signed)
Subjective:  Patient is awake, when asked where she is she started to say "moses...." but stopped.  She is able to follow all my commands but shows expressive difficulties with speech.  She has no complaints.  He family member is as bedside and states this is her baseline speech.  She often has significant difficulties with expressing herself since stroke.   Objective: Vital signs in last 24 hours: Temp:  [97.3 F (36.3 C)-97.4 F (36.3 C)] 97.4 F (36.3 C) (12/04 0411) Pulse Rate:  [66-150] 114  (12/04 0700) Resp:  [14-28] 18  (12/04 1100) BP: (155-194)/(78-127) 167/100 mmHg (12/04 1015) SpO2:  [93 %-98 %] 97 % (12/04 0700)  Intake/Output from previous day: 12/03 0701 - 12/04 0700 In: 1430 [P.O.:1230; IV Piggyback:200] Out: 1702 [Urine:1700; Emesis/NG output:1; Stool:1] Intake/Output this shift:   Nutritional status: Dysphagia  Past Medical History  Diagnosis Date  . Depressive disorder, not elsewhere classified   . Atrial fibrillation   . Unspecified transient cerebral ischemia   . Unspecified cerebral artery occlusion with cerebral infarction   . Unspecified essential hypertension   . Diverticulosis of colon (without mention of hemorrhage)   . Stricture and stenosis of esophagus   . Seizure disorder   . GERD (gastroesophageal reflux disease)   . IBS (irritable bowel syndrome)     Neurologic Exam: Mental Status:  Alert, as noted above.  Cranial Nerves:  II-. Visual fields grossly intact and actually tells me how many fingers I am holing up in both right and left visual fields.  III/IV/VI-Extraocular movements intact. Pupils reactive bilaterally.  V/VII-Smile symmetric  VIII-grossly intact  IX/X-normal gag  XI-bilateral shoulder shrug  XII-midline tongue extension  Motor: Increased tone throughout greater in right arm and leg than left. She does show some weakness in the right upper extremity compared to all other extremities. On exam she is able to hold both arms  antigravity but has difficulty raising her right arm greater than 45 degrees. Both legs show 4+/5. She moves all extremities purposefully and to command. Left grip >right Sensory: Pinprick and light touch intact throughout, bilaterally  Deep Tendon Reflexes: 2+ and symmetric throughout  Plantars: up going on the right. Down going on the left  Cerebellar: Normal finger-to-nose, and heel to shin   Lab Results: Lab Results  Component Value Date/Time   CHOL 182 03/08/2011 11:27 PM   Lipid Panel No results found for this basename: CHOL,TRIG,HDL,CHOLHDL,VLDL,LDLCALC in the last 72 hours  Studies/Results: Dg Chest 2 View  08/27/2011  *RADIOLOGY REPORT*  Clinical Data: Pulmonary vascular congestion.  Shortness of breath.  CHEST - 2 VIEW  Comparison: 11/29 and 08/20/2011  Findings: The lungs are clear except for  minimal residual atelectasis at the bases.  No effusions.  Heart size and vascularity are normal.  No acute osseous abnormality.  IMPRESSION: Minimal residual atelectasis at the lung bases.  Normal vascularity.  Original Report Authenticated By: Gwynn Burly, M.D.   Ct Head Wo Contrast  08/27/2011  *RADIOLOGY REPORT*  Clinical Data: Confusion, weakness; altered level of consciousness.  CT HEAD WITHOUT CONTRAST  Technique:  Contiguous axial images were obtained from the base of the skull through the vertex without contrast.  Comparison: CT of the head performed 08/20/2011  Findings: There is no evidence of acute infarction, mass lesion, or intra- or extra-axial hemorrhage on CT.  There is a region of chronic encephalomalacia at the left frontal lobe, with marked ex vacuo dilatation of the frontal horn of the left lateral  ventricle, underlying the patient's left frontal craniotomy flap.  Diffuse periventricular and subcortical white matter change likely reflects small vessel ischemic microangiopathy.  Small chronic lacunar infarcts are noted within the basal ganglia.  The posterior fossa,  including the cerebellum, brainstem and fourth ventricle, is within normal limits.  No mass effect or midline shift is seen.  There is no evidence of fracture; visualized osseous structures are otherwise unremarkable in appearance.  The visualized portions of the orbits are within normal limits.  The paranasal sinuses and mastoid air cells are well-aerated.  No significant soft tissue abnormalities are seen.  IMPRESSION:  1.  No acute intracranial pathology seen on CT. 2.  Postoperative changes at the left frontal lobe. 3.  Diffuse small vessel ischemic microangiopathy and small chronic lacunar infarcts within the basal ganglia.  Original Report Authenticated By: Tonia Ghent, M.D.   Mr Brain Wo Contrast  08/28/2011  *RADIOLOGY REPORT*  Clinical Data: Evaluate for acute stroke or bleed. Increased confusion.  History of atrial fibrillation.  Supratherapeutic INR.  MRI HEAD WITHOUT CONTRAST  Technique:  Multiplanar, multiecho pulse sequences of the brain and surrounding structures were obtained according to standard protocol without intravenous contrast.  Comparison: Most recent CT 08/27/2011.  There is a recent MR 03/08/2011.  Findings: There is no evidence for acute infarction, intracranial hemorrhage, mass lesion, hydrocephalus, or extra-axial fluid. Moderate to severe atrophy is present.  Advanced chronic microvascular ischemic change is present in the periventricular and subcortical white matter.  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  Extensive gliosis surrounds the porencephalic cyst.  No significant midline shift.  Right carotid and basilar artery both demonstrate patency based on flow void.  No flow void in the left internal carotid artery is consistent with chronic occlusion.  There appears to be some reconstitution of the supraclinoid ICA on the left due to collateral flow.  Normal orbits.  No acute sinus disease.  Minimal mastoid fluid is  nonspecific.    IMPRESSION: Atrophy and small vessel disease.  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  No acute intracranial findings.  Chronic occlusion left internal carotid artery.  Original Report Authenticated By: Elsie Stain, M.D.    Medications:  Prior to Admission:  Prescriptions prior to admission  Medication Sig Dispense Refill  . amLODipine (NORVASC) 10 MG tablet Take 10 mg by mouth daily.       . carbamazepine (TEGRETOL) 200 MG tablet Take 200 mg by mouth 2 (two) times daily.        . cholecalciferol (VITAMIN D) 1000 UNITS tablet Take 4,000 Units by mouth daily.        Marland Kitchen conjugated estrogens (PREMARIN) vaginal cream Place 30 g vaginally daily.        Marland Kitchen estradiol (ESTRACE) 0.1 MG/GM vaginal cream Place 2 g vaginally 3 (three) times a week.        . hyoscyamine (LEVSIN SL) 0.125 MG SL tablet Place 0.125 mg under the tongue every 4 (four) hours as needed. For stomach cramps       . lamoTRIgine (LAMICTAL) 25 MG tablet Take 25 mg by mouth 2 (two) times daily.        Marland Kitchen lisinopril (PRINIVIL,ZESTRIL) 40 MG tablet Take 40 mg by mouth daily.       Marland Kitchen LORazepam (ATIVAN) 1 MG tablet Take 1 mg by mouth at bedtime.        . metoprolol (LOPRESSOR) 50  MG tablet Take 50 mg by mouth 2 (two) times daily.       . ondansetron (ZOFRAN) 4 MG tablet Take 4 mg by mouth at bedtime.       . pantoprazole (PROTONIX) 40 MG tablet Take 40 mg by mouth daily.        Marland Kitchen saccharomyces boulardii (FLORASTOR) 250 MG capsule Take 250 mg by mouth 2 (two) times daily.        Marland Kitchen warfarin (COUMADIN) 2 MG tablet Take 2 mg by mouth as directed. Takes( 1/2 tablet ) 1mg  on  Mon Wed and Friday then (1 tablet) 2mg  on Tues Thurs Sat and Sunday       Scheduled:   . amLODipine  10 mg Oral Daily  . carbamazepine  200 mg Oral BID  . estradiol  2 g Vaginal 3 times weekly  . feeding supplement  1 Container Oral BID BM  . fluconazole (DIFLUCAN) IV  100 mg Intravenous Q24H  . insulin aspart   0-9 Units Subcutaneous Q4H  . lamoTRIgine  25 mg Oral BID  . lisinopril  40 mg Oral Daily  . magic mouthwash  15 mL Oral TID  . metoprolol tartrate  50 mg Oral BID  . metronidazole  500 mg Intravenous Q8H  . pantoprazole (PROTONIX) IV  40 mg Intravenous Q24H  . warfarin  0.5 mg Oral ONCE-1800  . white petrolatum      . DISCONTD: azithromycin  250 mg Intravenous Q24H  . DISCONTD: cefTRIAXone (ROCEPHIN)  IV  1 g Intravenous Q24H  . DISCONTD: LORazepam  1 mg Oral QHS  . DISCONTD: potassium chloride  40 mEq Oral Daily   Tegretol level is 7.1    Assessment/Plan:  Patient Active Hospital Problem List:  Lethargic (08/27/2011)   Assessment: 75 YO female with history left frontal stroke, tumor resection in the left frontal region and SZ who has been having confusion and difficulty with swallowing in setting of PNA, elevated WBC, supra therapeutic INR. MRI of brain showed large area of porencephaly left frontal lobe relating to previous tumor removal but no acute intracranial findings. Tegretol level 7.1 - within therapeutic range.  Brother feels patient's speech at baseline.     Plan:  EEG (PENDING)  No further recommendations at this time.  Will S/O but follow EEG.  Felicie Morn PA-C [facility] [phone]  08/28/2011, 11:26 AM

## 2011-08-28 NOTE — Progress Notes (Signed)
[facility] Daily Rounding Note 08/28/2011, 9:45 AM  SUBJECTIVE: 4 stools yesterday. Pt. Denies belly pain, cough, nausea.  Patient adamantly refuse to undergo a Barium esophagram.  Family has been trying to convince her of its importance, for the present they have persuaded her to proceed with the test.  They seem frustrated with her.  They say she is the most alert and lucid she has been in over one week.  They wonder if a drug interaction may have been cause of mental status changes. OBJECTIVE: Looks well  General: Elderly WF, NAD, non toxic.  Vital signs in last 24 hours: Temp:  [97.3 F (36.3 C)-97.4 F (36.3 C)] 97.4 F (36.3 C) (12/04 0411) Pulse Rate:  [66-150] 114  (12/04 0700) Resp:  [15-28] 19  (12/04 0700) BP: (155-185)/(86-97) 168/87 mmHg (12/04 0411) SpO2:  [93 %-98 %] 97 % (12/04 0700) Last BM Date: 08/28/11  Heart: Rapid, irregular Chest: Clear, but overall reduced breath sounds.  No coughing Abdomen: Soft, sounds hypoactive.  Extremities: no edema Neuro/Psych:  Engaged, alert, strong opinions.  Intake/Output from previous day: 12/03 0701 - 12/04 0700 In: 1430 [P.O.:1230; IV Piggyback:200] Out: 1702 [Urine:1700; Emesis/NG output:1; Stool:1]  Intake/Output this shift:    Lab Results:  Basename 08/28/11 0700 08/27/11 0500 08/26/11 0500  WBC 17.3* 15.5* 15.0*  HGB 13.8 13.1 13.1  HCT 41.6 39.5 39.1  PLT 366 334 336   BMET  Basename 08/28/11 0700 08/27/11 0500 08/26/11 0500  NA 141 143 140  K 3.9 3.7 3.4*  CL 105 105 103  CO2 25 28 29   GLUCOSE 97 113* 112*  BUN 19 22 23   CREATININE 0.78 0.79 0.83  CALCIUM 9.0 9.2 9.2   LFT No results found for this basename: PROT,ALBUMIN,AST,ALT,ALKPHOS,BILITOT,BILIDIR,IBILI in the last 72 hours PT/INR  Basename 08/28/11 0700 08/27/11 0500  LABPROT 33.2* 38.8*  INR 3.19* 3.90*    Studies/Results:  Ct Head Wo Contrast  08/27/2011  *RADIOLOGY REPORT*  Clinical Data: Confusion, weakness; altered level of  consciousness.  CT HEAD WITHOUT CONTRAST  Technique:  Contiguous axial images were obtained from the base of the skull through the vertex without contrast.  Comparison: CT of the head performed 08/20/2011  Findings: There is no evidence of acute infarction, mass lesion, or intra- or extra-axial hemorrhage on CT.  There is a region of chronic encephalomalacia at the left frontal lobe, with marked ex vacuo dilatation of the frontal horn of the left lateral ventricle, underlying the patient's left frontal craniotomy flap.  Diffuse periventricular and subcortical white matter change likely reflects small vessel ischemic microangiopathy.  Small chronic lacunar infarcts are noted within the basal ganglia.  The posterior fossa, including the cerebellum, brainstem and fourth ventricle, is within normal limits.  No mass effect or midline shift is seen.  There is no evidence of fracture; visualized osseous structures are otherwise unremarkable in appearance.  The visualized portions of the orbits are within normal limits.  The paranasal sinuses and mastoid air cells are well-aerated.  No significant soft tissue abnormalities are seen.  IMPRESSION:  1.  No acute intracranial pathology seen on CT. 2.  Postoperative changes at the left frontal lobe. 3.  Diffuse small vessel ischemic microangiopathy and small chronic lacunar infarcts within the basal ganglia.  Original Report Authenticated By: Tonia Ghent, M.D.   Mr Brain Wo Contrast  08/28/2011  *RADIOLOGY REPORT*  Clinical Data: Evaluate for acute stroke or bleed. Increased confusion.  History of atrial fibrillation.  Supratherapeutic  INR.  MRI HEAD WITHOUT CONTRAST  Technique:  Multiplanar, multiecho pulse sequences of the brain and surrounding structures were obtained according to standard protocol without intravenous contrast.  Comparison: Most recent CT 08/27/2011.  There is a recent MR 03/08/2011.  Findings: There is no evidence for acute infarction, intracranial  hemorrhage, mass lesion, hydrocephalus, or extra-axial fluid. Moderate to severe atrophy is present.  Advanced chronic microvascular ischemic change is present in the periventricular and subcortical white matter.  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  Extensive gliosis surrounds the porencephalic cyst.  No significant midline shift.  Right carotid and basilar artery both demonstrate patency based on flow void.  No flow void in the left internal carotid artery is consistent with chronic occlusion.  There appears to be some reconstitution of the supraclinoid ICA on the left due to collateral flow.  Normal orbits.  No acute sinus disease.  Minimal mastoid fluid is nonspecific.  IMPRESSION: Atrophy and small vessel disease.  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  No acute intracranial findings.  Chronic occlusion left internal carotid artery.  Original Report Authenticated By: Elsie Stain, M.D.   08/24/2011  Modified Barium Swallow Study Recommendation/Prognosis  Dysphagia Diagnosis: Mild pharyngeal phase dysphagia;Mild oral phase dysphagia  Clinical impression: Demonstrates a both a mild oral and pharyngeal phase dysphagia marked predominantly by xerostomia and hard, crusty oral secretions that impeded swallow sensation and function. Extensive oral care provided to alleviate this etiology, however patient resistant to aggressive oral care attempts. Observed deep, silent laryngeal penetration of thin liquids without any aspiration, however due to mucosal issues and significant cognitive impairment with a questionable right lower lobe pna, will proceed with a conservative diet with the expectation of an advancement as cognition and oral care improve.  Recommendations  1. Dysphagia 1 (Puree) & Nectar-Thick liquids  2. Medication Administration: Whole meds with puree  3. Full supervision/cueing for  compensatory strategies  4. Slow rate;Small sips/bites; Check for pocketing; Follow solids with liquid  5. Seated upright 90 degrees;Upright 30-60 min after meal  Oral care QID     ASSESMENT: 1.  Oropharyngeal dysphagia.  Tolerating the Dysphagia 1 (puree) diet without complications. SLP  Evaluation unable to assess esophageal function.  2.  Worsening dysarthria.  Note no new acute findings on today's MRI or yesterday's Head CT.  Chronic small vessel dz evident along with post operative change in frontal lobe. 3.  Pneumonia, possibly secondary to aspiration.  Completed course of Rocephin and Zithromax.  Note the rising WBC count. 4.  Chronic Coumadin: hx A fib. Hx CVA. 5.  C Diff colitis. On IV Flagyl, previously on oral Flagyl, total days of Flagyl are 7, today is day 8.   PLAN: 1.  Will order Barium Esophagram to discern esophageal function.  Decide on need for EGD based on this 2.  Continue Flagyl.   LOS: 8 days   Jennye Moccasin  08/28/2011, 9:45 AM Pager: [phone]

## 2011-08-28 NOTE — Progress Notes (Signed)
Pt B/P elevated, Apresoline given per orders. Md Magick-Myers notified, no new orders received

## 2011-08-28 NOTE — Progress Notes (Signed)
Patient seen, examined and I agree with the above documentation, including the assessment and plan.  Dysphagia -- difficult to fully understand given patient's aphasia.  Watching her drink (thickened liquids) and eat yogurt, there appears to be no severe dysphagia nor odynophagia. She has dry mouth and some difficulty with bolus transfer, but it is unclear if there is a component of esophageal dysphagia which may contribute to the pharyngeal problems. The best way, at this pt to discern this information is felt to be barium esophagram.  Her INR is > 3, and EGD could only be diagnostic, but no therapeutic at this level of anticoagulation.  EGD could be performed later if esophagram reveals an area of possible therapeutic intervention. Appreciate speech therapy inpt  Nausea, with 2 recent episodes of vomiting -- reportedly nonbloody.  Unclear etiology, could be medication related.  Will follow this.  C diff -- on flagyl (IV) day 8, overall felt to be improving at present.

## 2011-08-29 ENCOUNTER — Ambulatory Visit (HOSPITAL_COMMUNITY): Payer: Medicare Other

## 2011-08-29 ENCOUNTER — Inpatient Hospital Stay (HOSPITAL_COMMUNITY): Payer: Medicare Other

## 2011-08-29 ENCOUNTER — Other Ambulatory Visit: Payer: Self-pay

## 2011-08-29 LAB — CBC
HCT: 42 % (ref 36.0–46.0)
MCH: 31.9 pg (ref 26.0–34.0)
MCHC: 33.1 g/dL (ref 30.0–36.0)
MCV: 96.3 fL (ref 78.0–100.0)
Platelets: 420 10*3/uL — ABNORMAL HIGH (ref 150–400)
RDW: 13.8 % (ref 11.5–15.5)
WBC: 16.1 10*3/uL — ABNORMAL HIGH (ref 4.0–10.5)

## 2011-08-29 LAB — BASIC METABOLIC PANEL
BUN: 18 mg/dL (ref 6–23)
Calcium: 9 mg/dL (ref 8.4–10.5)
Creatinine, Ser: 0.74 mg/dL (ref 0.50–1.10)
GFR calc Af Amer: >90 mL/min (ref >90)

## 2011-08-29 LAB — GLUCOSE, CAPILLARY: Glucose-Capillary: 136 mg/dL — ABNORMAL HIGH (ref 70–99)

## 2011-08-29 MED ORDER — WHITE PETROLATUM GEL
Status: AC
  Administered 2011-08-29: 1
  Filled 2011-08-29: qty 5

## 2011-08-29 MED ORDER — PANTOPRAZOLE SODIUM 40 MG PO TBEC
40.0000 mg | DELAYED_RELEASE_TABLET | Freq: Every day | ORAL | Status: DC
  Administered 2011-08-30 – 2011-09-01 (×3): 40 mg via ORAL
  Filled 2011-08-29 (×2): qty 1

## 2011-08-29 MED ORDER — ONDANSETRON HCL 4 MG/2ML IJ SOLN
2.0000 mg | Freq: Once | INTRAMUSCULAR | Status: AC
  Administered 2011-08-29: 2 mg via INTRAVENOUS

## 2011-08-29 MED ORDER — WARFARIN SODIUM 1 MG PO TABS
1.0000 mg | ORAL_TABLET | Freq: Once | ORAL | Status: AC
  Administered 2011-08-29: 18:00:00 1 mg via ORAL
  Filled 2011-08-29: qty 1

## 2011-08-29 NOTE — Progress Notes (Signed)
Subjective: Patient awake following some command. She is pleasantly confused. She was not able to tell me how many children she has. She is complaining of epigastric pain she doesn't know for how long she has had pain. She has been having some nausea. Per nurse patient has had 2 to 3 BM per shift small stool amount yesterday.  Objective: Filed Vitals:   08/29/11 0021 08/29/11 0445 08/29/11 0451 08/29/11 0517  BP: 161/94 173/85 173/85 143/84  Pulse: 93 90    Temp: 98.4 F (36.9 C) 98.1 F (36.7 C)    TempSrc: Oral Oral    Resp: 20 16    Height:      Weight:      SpO2: 96% 97%     Weight change:   Intake/Output Summary (Last 24 hours) at 08/29/11 0824 Last data filed at 08/29/11 0447  Gross per 24 hour  Intake    620 ml  Output   1553 ml  Net   -933 ml    General: Alert, awake, , in no acute distress.  HEENT: No bruits, no goiter.  Heart: Regular rate and rhythm, without murmurs, rubs, gallops.  Lungs: bilateral air movement, decrease on the right side.  Abdomen: Soft, mild epigastric tenderness, no rigidity, nondistended, positive bowel sounds.  Neuro: Awake, following some command.    Lab Results:  [facility] 08/29/11 0618 08/28/11 0700  NA 140 141  K 3.6 3.9  CL 105 105  CO2 25 25  GLUCOSE 102* 97  BUN 18 19  CREATININE 0.74 0.78  CALCIUM 9.0 9.0  MG -- --  PHOS -- --    Basename 08/29/11 0618 08/28/11 0700  WBC 16.1* 17.3*  NEUTROABS -- --  HGB 13.9 13.8  HCT 42.0 41.6  MCV 96.3 97.7  PLT 420* 366    Basename 08/28/11 0700 08/28/11 0034 08/27/11 1400  CKTOTAL 25 21 21   CKMB 1.9 1.7 1.8  CKMBINDEX -- -- --  TROPONINI <0.30 <0.30 <0.30    Basename 08/27/11 1353  POCBNP 1611.0*    Micro Results: Recent Results (from the past 240 hour(s))  URINE CULTURE     Status: Normal   Collection Time   08/20/11  3:23 PM      Component Value Range Status Comment   Specimen Description URINE, CLEAN CATCH   Final    Special Requests NONE   Final    Setup  Time [id number]   Final    Colony Count 20,OOO COLONIES/ML   Final    Culture ENTEROCOCCUS SPECIES   Final    Report Status 08/24/2011 FINAL   Final    Organism ID, Bacteria ENTEROCOCCUS SPECIES   Final   CULTURE, BLOOD (ROUTINE X 2)     Status: Normal   Collection Time   08/21/11  1:00 AM      Component Value Range Status Comment   Specimen Description BLOOD LEFT ARM   Final    Special Requests BOTTLES DRAWN AEROBIC AND ANAEROBIC 10CC EA   Final    Setup Time [id number]   Final    Culture NO GROWTH 5 DAYS   Final    Report Status 08/27/2011 FINAL   Final   CULTURE, BLOOD (ROUTINE X 2)     Status: Normal   Collection Time   08/21/11  1:15 AM      Component Value Range Status Comment   Specimen Description BLOOD LEFT WRIST   Final    Special Requests BOTTLES DRAWN AEROBIC AND ANAEROBIC 10CC  EA   Final    Setup Time [id number]   Final    Culture NO GROWTH 5 DAYS   Final    Report Status 08/27/2011 FINAL   Final   CLOSTRIDIUM DIFFICILE BY PCR     Status: Abnormal   Collection Time   08/21/11  8:31 AM      Component Value Range Status Comment   C difficile by pcr POSITIVE (*) NEGATIVE  Final   STOOL CULTURE     Status: Normal   Collection Time   08/21/11  8:32 AM      Component Value Range Status Comment   Specimen Description STOOL   Final    Special Requests NONE   Final    Culture     Final    Value: NO SALMONELLA, SHIGELLA, CAMPYLOBACTER, OR YERSINIA ISOLATED   Report Status 08/25/2011 FINAL   Final   MRSA PCR SCREENING     Status: Abnormal   Collection Time   08/22/11  3:25 AM      Component Value Range Status Comment   MRSA by PCR POSITIVE (*) NEGATIVE  Final     Studies/Results: Dg Chest 2 View  08/27/2011  *RADIOLOGY REPORT*  Clinical Data: Pulmonary vascular congestion.  Shortness of breath.  CHEST - 2 VIEW  Comparison: 11/29 and 08/20/2011  Findings: The lungs are clear except for  minimal residual atelectasis at the bases.  No effusions.  Heart size and  vascularity are normal.  No acute osseous abnormality.  IMPRESSION: Minimal residual atelectasis at the lung bases.  Normal vascularity.  Original Report Authenticated By: Gwynn Burly, M.D.   Ct Head Wo Contrast  08/27/2011  *RADIOLOGY REPORT*  Clinical Data: Confusion, weakness; altered level of consciousness.  CT HEAD WITHOUT CONTRAST  Technique:  Contiguous axial images were obtained from the base of the skull through the vertex without contrast.  Comparison: CT of the head performed 08/20/2011  Findings: There is no evidence of acute infarction, mass lesion, or intra- or extra-axial hemorrhage on CT.  There is a region of chronic encephalomalacia at the left frontal lobe, with marked ex vacuo dilatation of the frontal horn of the left lateral ventricle, underlying the patient's left frontal craniotomy flap.  Diffuse periventricular and subcortical white matter change likely reflects small vessel ischemic microangiopathy.  Small chronic lacunar infarcts are noted within the basal ganglia.  The posterior fossa, including the cerebellum, brainstem and fourth ventricle, is within normal limits.  No mass effect or midline shift is seen.  There is no evidence of fracture; visualized osseous structures are otherwise unremarkable in appearance.  The visualized portions of the orbits are within normal limits.  The paranasal sinuses and mastoid air cells are well-aerated.  No significant soft tissue abnormalities are seen.  IMPRESSION:  1.  No acute intracranial pathology seen on CT. 2.  Postoperative changes at the left frontal lobe. 3.  Diffuse small vessel ischemic microangiopathy and small chronic lacunar infarcts within the basal ganglia.  Original Report Authenticated By: Tonia Ghent, M.D.   Mr Brain Wo Contrast  08/28/2011  *RADIOLOGY REPORT*  Clinical Data: Evaluate for acute stroke or bleed. Increased confusion.  History of atrial fibrillation.  Supratherapeutic INR.  MRI HEAD WITHOUT CONTRAST   Technique:  Multiplanar, multiecho pulse sequences of the brain and surrounding structures were obtained according to standard protocol without intravenous contrast.  Comparison: Most recent CT 08/27/2011.  There is a recent MR 03/08/2011.  Findings: There is no evidence for  acute infarction, intracranial hemorrhage, mass lesion, hydrocephalus, or extra-axial fluid. Moderate to severe atrophy is present.  Advanced chronic microvascular ischemic change is present in the periventricular and subcortical white matter.  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  Extensive gliosis surrounds the porencephalic cyst.  No significant midline shift.  Right carotid and basilar artery both demonstrate patency based on flow void.  No flow void in the left internal carotid artery is consistent with chronic occlusion.  There appears to be some reconstitution of the supraclinoid ICA on the left due to collateral flow.  Normal orbits.  No acute sinus disease.  Minimal mastoid fluid is nonspecific.  IMPRESSION: Atrophy and small vessel disease.  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  No acute intracranial findings.  Chronic occlusion left internal carotid artery.  Original Report Authenticated By: Elsie Stain, M.D.   Dg Esophagus  08/28/2011  *RADIOLOGY REPORT*  Clinical Data: Dysphagia.  ESOPHOGRAM/BARIUM SWALLOW  Technique:  Single contrast examination was performed using Omnipaque-300.  Fluoroscopy time:  1.44 minutes.  Comparison:  None  Findings:  Nonspecific esophageal motility disorder with occasional disruption of the primary peristaltic wave and occasional tertiary contractions.  No findings for diffuse esophageal spasm.  There is a widely patent lower esophageal mucosal ring and a very small sliding-type hiatal hernia.  No gastroesophageal reflux was demonstrated.  No intrinsic or extrinsic lesions of the esophagus were  identified and no mucosal abnormalities were seen.  IMPRESSION:  1.  Nonspecific esophageal motility disorder. 2.  Widely patent lower esophageal mucosal ring. 3.  Very small sliding-type hiatal hernia. 4.  No gastroesophageal reflux was demonstrated.  Original Report Authenticated By: P. Loralie Champagne, M.D.    Medications: I have reviewed the patient's current medications.   Patient Active Hospital Problem List:  PNA (pneumonia) (08/22/2011)  Patient received a 5 days of ceftriaxone and Azithromycin. Patient on dysphagia 1 diet for aspiration precaution. I will repeat chest x-ray to follow up infiltrate.  HYPERTENSION (12/29/2007)  Continue with Norvasc , lisinopril and metoprolol.   FIBRILLATION, ATRIAL (12/29/2007) Heart rate in the 120 this morning , I will check EKG , patient will receive her metoprolol dose earlier.  will follow INR. Today is at 2.6. Will consider start Coumadin December 6 her pharmacy to dose .  GERD (gastroesophageal reflux disease) (06/12/2011), Nausea:  Patient on Protonix.  Patient is still with nausea , this could be secondary to medication . Will give an extra dose of Zofran . Will avoid to give more than 16 mg of Zofran in 24 hours. Will follow GI recommendation.  C. difficile colitis  Patient had multiple bowel movements yesterday. Per nurses small amount. Continue with Flagyl IV. Will deferred to GI use of vancomycin.   Flagyl started on November 27, date 9 of 14.   Hypokalemia (08/20/2011)  resolved.  Hypomagnesemia (08/22/2011)  repeat MG in the morning.  Acute respiratory failure (08/22/2011) Secondary to pneumonia and now is stable. Repeat chest x-ray .  Diastolic CHF, acute (08/23/2011)  appears evolemic,  follow up chest x-ray   Lethargic (08/27/2011)/Encephalopathy:  Unclear etiology , this could be secondary to delirium, infection process. MRI negative for acute stroke . EEG pending . Dysphagia:  Esophagogram showed motility disorder. Will  follow GI recommendation regarding endoscopy.  Oral Candidiasis:  Continue with fluconazole, and started December 4. Day 2.      LOS: 9 days   Fenix Rorke  M.D.  [facility] 08/29/2011, 8:24 AM

## 2011-08-29 NOTE — Progress Notes (Signed)
[facility] Daily Rounding Note 08/29/2011, 9:00 AM  SUBJECTIVE: Received Zofran this AM for c/o nausea.  2 small BMs in last 10 hours.  No c/o abd pain. OBJECTIVE: Aged, unwell appearing.  NAD  Vital signs in last 24 hours: Temp:  [98 F (36.7 C)-98.9 F (37.2 C)] 98.6 F (37 C) (12/05 0836) Pulse Rate:  [89-137] 137  (12/05 0836) Resp:  [14-28] 19  (12/05 0836) BP: (143-194)/(78-100) 157/84 mmHg (12/05 0836) SpO2:  [96 %-97 %] 97 % (12/05 0836) Last BM Date: 08/28/11  Heart: Tachy, irreg/irreg Chest: Clear B but diminished BS Abdomen: Soft, NT, ND, BS hypoactive.  Extremities: No edema.  Feet warm Neuro/Psych:  Follows commands. Moves all 4s  Intake/Output from previous day: 12/04 0701 - 12/05 0700 In: 620 [P.O.:420; IV Piggyback:200] Out: 1553 [Urine:1550; Stool:3]    Lab Results:  [facility] 08/29/11 0618 08/28/11 0700 08/27/11 0500  WBC 16.1* 17.3* 15.5*  HGB 13.9 13.8 13.1  HCT 42.0 41.6 39.5  PLT 420* 366 334   BMET  Basename 08/29/11 0618 08/28/11 0700 08/27/11 0500  NA 140 141 143  K 3.6 3.9 3.7  CL 105 105 105  CO2 25 25 28   GLUCOSE 102* 97 113*  BUN 18 19 22   CREATININE 0.74 0.78 0.79  CALCIUM 9.0 9.0 9.2   LFT No results found for this basename: PROT,ALBUMIN,AST,ALT,ALKPHOS,BILITOT,BILIDIR,IBILI in the last 72 hours PT/INR  Basename 08/29/11 0618 08/28/11 0700  LABPROT 28.6* 33.2*  INR 2.64* 3.19*   Studies/Results: Dg Chest 2 View  08/27/2011  *RADIOLOGY REPORT*  Clinical Data: Pulmonary vascular congestion.  Shortness of breath.  CHEST - 2 VIEW  Comparison: 11/29 and 08/20/2011  Findings: The lungs are clear except for  minimal residual atelectasis at the bases.  No effusions.  Heart size and vascularity are normal.  No acute osseous abnormality.  IMPRESSION: Minimal residual atelectasis at the lung bases.  Normal vascularity.  Original Report Authenticated By: Gwynn Burly, M.D.   Dg Esophagus  08/28/2011  *RADIOLOGY REPORT*   Clinical Data: Dysphagia.  ESOPHOGRAM/BARIUM SWALLOW  Technique:  Single contrast examination was performed using Omnipaque-300.  Fluoroscopy time:  1.44 minutes.  Comparison:  None  Findings:  Nonspecific esophageal motility disorder with occasional disruption of the primary peristaltic wave and occasional tertiary contractions.  No findings for diffuse esophageal spasm.  There is a widely patent lower esophageal mucosal ring and a very small sliding-type hiatal hernia.  No gastroesophageal reflux was demonstrated.  No intrinsic or extrinsic lesions of the esophagus were identified and no mucosal abnormalities were seen.  IMPRESSION:  1.  Nonspecific esophageal motility disorder. 2.  Widely patent lower esophageal mucosal ring. 3.  Very small sliding-type hiatal hernia. 4.  No gastroesophageal reflux was demonstrated.  Original Report Authenticated By: P. Loralie Champagne, M.D.    ASSESMENT: 1. Oropharyngeal dysphagia. Tolerating the Dysphagia 1 (puree) diet without complications, not eating much.  Esophagram does not reveal significant dysfunction/dysmotility, nor any stricture.  2.  Dysarthria. 3. Pneumonia, possibly secondary to aspiration. Completed course of Rocephin and Zithromax.  WBC count improved. CXR improved. Dr. Sunnie Nielsen ordering cxr for today. 4. Chronic Coumadin: hx A fib. Hx CVA. Therapeutic INR. 5. C Diff colitis. On IV Flagyl, previously on oral Flagyl, today is day 9.  Stool volume decreased, still has loose stools. 6. GERD. On IV Protonix. 7.  Oral Candidiasis.  Day 2 IV Diflucan.  PLAN: 1.  Change to PO Protonix. 2.  Diet recs per  SLP   LOS: 9 days   [name]  08/29/2011, 9:00 AM Pager: [phone]

## 2011-08-29 NOTE — Progress Notes (Signed)
ANTICOAGULATION CONSULT NOTE - Follow Up Consult  Pharmacy Consult for Coumadin Indication:  A.Fib  Allergies  Allergen Reactions  . Ciprofloxacin     REACTION: diarrhea/yeast infection  . Penicillins Hives    Patient Measurements: Height: 5\' 6"  (167.6 cm) Weight: 166 lb 10.7 oz (75.6 kg) IBW/kg (Calculated) : 59.3    Vital Signs: Temp: 98.6 F (37 C) (12/05 0836) Temp src: Oral (12/05 0836) BP: 157/84 mmHg (12/05 0836) Pulse Rate: 137  (12/05 0836)  Labs:  Basename 08/29/11 0618 08/28/11 0700 08/28/11 0034 08/27/11 1400 08/27/11 0500  HGB 13.9 13.8 -- -- --  HCT 42.0 41.6 -- -- 39.5  PLT 420* 366 -- -- 334  APTT -- -- -- -- --  LABPROT 28.6* 33.2* -- -- 38.8*  INR 2.64* 3.19* -- -- 3.90*  HEPARINUNFRC -- -- -- -- --  CREATININE 0.74 0.78 -- -- 0.79  CKTOTAL -- 25 21 21  --  CKMB -- 1.9 1.7 1.8 --  TROPONINI -- <0.30 <0.30 <0.30 --   Estimated Creatinine Clearance: 65.1 ml/min (by C-G formula based on Cr of 0.74).   Medications:  Scheduled:     . amLODipine  10 mg Oral Daily  . carbamazepine  200 mg Oral BID  . estradiol  2 g Vaginal 3 times weekly  . feeding supplement  1 Container Oral BID BM  . fluconazole (DIFLUCAN) IV  100 mg Intravenous Q24H  . insulin aspart  0-9 Units Subcutaneous Q4H  . lamoTRIgine  25 mg Oral BID  . lisinopril  40 mg Oral Daily  . magic mouthwash  15 mL Oral TID  . metoprolol tartrate  50 mg Oral BID  . metronidazole  500 mg Intravenous Q8H  . ondansetron  2 mg Intravenous Once  . pantoprazole (PROTONIX) IV  40 mg Intravenous Q24H  . warfarin  0.5 mg Oral ONCE-1800  . white petrolatum        Assessment: 75yo female on Coumadin for A.Fib.  INR=2.64  this AM.   Hg stable, no bleeding issues noted.  Goal of Therapy:  INR 2-3   Plan:  1.  Coumadin 1 mg po x 1 dose today 2.  Follow-up AM INR   Elwin Sleight 08/29/2011,9:10 AM

## 2011-08-29 NOTE — Progress Notes (Signed)
Patient seen, examined and I agree with the above documentation, including the assessment and plan.  1. Dysphagia - esophagram reviewed.  Likely some component of esophageal dysmotility.  No obstructing stricture or mass seen.  No reflux noted, at least during the study period.  No mucosal abnormality.  This is reassuring, and with this information, I do not think she needs an EGD to further eval swallowing difficulty.  She has a known oropharyngeal component to her swallowing issues.  Speech and swallow team is on board and helping with diet modifications.  2. C diff - on flagyl. RN reports 1 soft, but formed brown stool since 7 am.  RN notes no diarrhea (apparently had 2 BM's during night shift last night).  Flagyl day 9 of 14.  Would complete this course.  3. Nausea - RN also notes she complains of nausea shortly after flagyl dosing.  This is likely contributing and can be treated symptomatically.  Nausea was reportedly a problem prior to this hospitalization, but given her recent more acute issues (PNA, c diff, abx use), would rec symptomatic therapy for now.  Can be further evaluated once acute issues and acute medications are resolved.  She can f/u in clinic after d/c.  She has seen Dr. Arlyce Dice in the past.  Call with questions.

## 2011-08-29 NOTE — Progress Notes (Signed)
Physical Therapy Treatment Patient Details Name: [name] MRN: [id number] DOB: Feb 02, 1936 Today's Date: 08/29/2011  PT Assessment/Plan  PT - Assessment/Plan Comments on Treatment Session: Pt continues to have elevated HR with minimal activity, pt  in A-FiB at baseline.   PT Plan: Discharge plan remains appropriate PT Frequency: Min 3X/week Follow Up Recommendations: Skilled nursing facility Equipment Recommended: Defer to next venue PT Goals  Acute Rehab PT Goals PT Goal Formulation: With patient Time For Goal Achievement: 2 weeks Pt will go Supine/Side to Sit: with min assist PT Goal: Supine/Side to Sit - Progress: Other (comment) (Not addressed this visit) Pt will Sit at [facility] of Bed: with supervision;6-10 min;with no upper extremity support PT Goal: Sit at Edge Of Bed - Progress: Other (comment) (Not addressed this visit) Pt will go Sit to Supine/Side: with mod assist PT Goal: Sit to Supine/Side - Progress: Other (comment) (Not addressed this visit) Pt will go Sit to Stand: with mod assist PT Goal: Sit to Stand - Progress: Progressing toward goal Pt will go Stand to Sit: with mod assist PT Goal: Stand to Sit - Progress: Progressing toward goal Pt will Transfer Bed to Chair/Chair to Bed: with supervision PT Transfer Goal: Bed to Chair/Chair to Bed - Progress: Other (comment) (not address this visit)  PT Treatment Precautions/Restrictions  Precautions Precautions: Fall Required Braces or Orthoses: No Restrictions Weight Bearing Restrictions: No Mobility (including Balance) Bed Mobility Bed Mobility: No Transfers Transfers: Yes Sit to Stand: 2: Max assist;From chair/3-in-1;With upper extremity assist Sit to Stand Details (indicate cue type and reason): Verbal and tactile cues for hand placement.  Manual facilitation for anterior wt shift, and trunk extension.  pt unable to achieve full standing  due to Right LE weakness. Pt unable to support wt on Right LE . Pt  presenting with posterior lean in standing (back of legs against recliner) and unable to correct despite manual facilitation and tactile cues to shift wt forward over her forefoot.   (two trials ) Stand to Sit: 2: Max assist;To chair/3-in-1;With upper extremity assist Stand to Sit Details: Verbal and tactile cues to grasp armrest.  Manual assist for controlled descent to chair.   (two trials) Ambulation/Gait Ambulation/Gait: No Stairs: No Wheelchair Mobility Wheelchair Mobility: No  Posture/Postural Control Posture/Postural Control: Postural limitations Balance Balance Assessed: No Exercise    End of Session PT - End of Session Equipment Utilized During Treatment: Gait belt Activity Tolerance: Patient limited by fatigue;Other (comment) (pt's HR increased from 98 to 142 during min activity) Patient left: in chair;with call bell in reach Nurse Communication: Mobility status for transfers General Behavior During Session: Lethargic Cognition: Impaired Cognitive Impairment: Impaired attention and difficulty following commands, pt. with history of aphasia.   Kedar Sedano L. Analuisa Tudor DPT [phone] [name] 08/29/2011, 4:45 PM

## 2011-08-30 ENCOUNTER — Ambulatory Visit (HOSPITAL_COMMUNITY): Payer: Medicare Other

## 2011-08-30 LAB — CBC
HCT: 39.4 % (ref 36.0–46.0)
Hemoglobin: 13.1 g/dL (ref 12.0–15.0)
MCH: 32.1 pg (ref 26.0–34.0)
MCHC: 33.2 g/dL (ref 30.0–36.0)
RDW: 14.1 % (ref 11.5–15.5)

## 2011-08-30 LAB — BASIC METABOLIC PANEL
BUN: 20 mg/dL (ref 6–23)
Calcium: 9.3 mg/dL (ref 8.4–10.5)
GFR calc non Af Amer: 81 mL/min — ABNORMAL LOW (ref >90)
Glucose, Bld: 103 mg/dL — ABNORMAL HIGH (ref 70–99)

## 2011-08-30 LAB — GLUCOSE, CAPILLARY
Glucose-Capillary: 101 mg/dL — ABNORMAL HIGH (ref 70–99)
Glucose-Capillary: 145 mg/dL — ABNORMAL HIGH (ref 70–99)

## 2011-08-30 LAB — PROTIME-INR: INR: 2.26 — ABNORMAL HIGH (ref 0.00–1.49)

## 2011-08-30 MED ORDER — WARFARIN SODIUM 2 MG PO TABS
2.0000 mg | ORAL_TABLET | Freq: Once | ORAL | Status: AC
  Administered 2011-08-30: 19:00:00 2 mg via ORAL
  Filled 2011-08-30: qty 1

## 2011-08-30 MED ORDER — BARRIER CREAM NON-SPECIFIED
1.0000 "application " | TOPICAL_CREAM | Freq: Three times a day (TID) | TOPICAL | Status: DC | PRN
  Filled 2011-08-30: qty 1

## 2011-08-30 MED ORDER — MAGNESIUM OXIDE 400 MG PO TABS
200.0000 mg | ORAL_TABLET | Freq: Every day | ORAL | Status: AC
  Administered 2011-08-30 – 2011-08-31 (×2): 200 mg via ORAL
  Filled 2011-08-30 (×2): qty 0.5

## 2011-08-30 MED ORDER — METOPROLOL TARTRATE 100 MG PO TABS
100.0000 mg | ORAL_TABLET | Freq: Two times a day (BID) | ORAL | Status: DC
  Administered 2011-08-30 – 2011-09-04 (×11): 100 mg via ORAL
  Filled 2011-08-30 (×12): qty 1

## 2011-08-30 NOTE — Progress Notes (Signed)
Speech Language/Pathology Speech Pathology: Dysphagia Treatment Note  Patient was observed with : Mechanical Soft / Ground and Thin liquids.  Patient was noted to have s/s of aspiration : No:    Lung Sounds:  Afebrile  Temperature: clear  Patient required: moderate verbal and visual cues to follow compensatory strategies and basci instruction (secondary to receptive/expressive aphasia). SLP provided trials of thin liquids wich pt consumed with small sips with no overt s/s of aspiration. SLP explained with visual cue san dgestures that pt should alternate solids and liquids to attempt to compensate for esophageal dysmotility documented by GI yesterday. Pt followed with modeate cues over 4 trials and then demonstrated independently. Pt also masticated soft solid fruit without significant oral residue or delay.     Clinical Impression: Pt alertness and oral hygiene have improved (oral cavity still red but moist and without white coating), allowing pt to more safely tolerate thin liquids.  Pt will continue to need full supervision to follow compensatory strategies due to decreased attention and awareness.   Recommendations:  Upgrade to Dysphagia 2/thin liquids. Provide full supervision for small sips, no straws, upright posture and following solids with liquids. SLP will f/u in one day for safety with upgraded diet. Family and pt require further education.     Pain:   mild Intervention Required:   No Pt complains of tummy ache though PO intake was much improved today.   Goals: Goals Partially Met

## 2011-08-30 NOTE — Consult Note (Signed)
WOC consult Note Reason for Consult: req to eval for topical treatment related to MASD (moisture associated skin damage) and yeast of the buttocks, perineal area, groin, and labial areas.  Pt with CDiff and freq loose stools.   Wound type: combination candida and moisture associated skin damage  Measurement: extends over entire buttocks, into gluteal folds, perineal areas, bil. Groin and labial areas.    Wound bed: extensive, intense redness with satellite lesions, painful with cleansing of this skin and to touch  Drainage (amount, consistency, odor) none  Dressing procedure/placement/frequency: nursing staff has initiated zinc based barrier with use of antifungal powder as well, this along with Diflucan IV is the best practice for tx of this type of dermatitis.  Recommend dusting all affected areas with antifungal powder first then thick layer of zinc based barrier.  This to be applied at least 2-3 times per day and after each stool.  Careful cleansing of this skin as well.   Re consult if needed, will not follow at this time. Thanks  Mallie Giambra [employer], CWOCN [phone])

## 2011-08-30 NOTE — Progress Notes (Signed)
Patient's dtr contacted CSW and stated that patient and father were currently on the waiting list at [facility] for ALF. Dtr stated family was [facility] in [facility] but was aware there might be a waiting list. CSW sent referral via [facility] and called SN[facility]) in order to determine if any beds were available. CSW explained to dtr that [facility] bed if they still wanted that facility. Dtr stated they were comfortable with that facility since husband had stayed there in the past. CSW called [facility] who stated they could accept patient on Monday and patient did not need PICC line if it was going to be her last day of antibiotics. CSW text paged MD this information. CSW will follow up with dtr after determining bed availability at [facility]. [place], [place] [phone]

## 2011-08-30 NOTE — Progress Notes (Signed)
CSW called dtr and updated her that no response from [facility]. CSW will contact dtr as soon as decision is made. CSW will continue to follow. [place], [place] [phone]

## 2011-08-30 NOTE — Progress Notes (Signed)
Subjective: Patients relates feeling better. Nausea improved. Was able to tell me that she has 2 Childrens and their names. Denies Shortness of breath.  Objective: Filed Vitals:   08/30/11 0635 08/30/11 0700 08/30/11 0737 08/30/11 0900  BP: 165/82  136/80   Pulse:  120 119 108  Temp:   98.3 F (36.8 C)   TempSrc:   Oral   Resp: 18 19 11 16   Height:      Weight:      SpO2:  96% 94% 95%   Weight change:   Intake/Output Summary (Last 24 hours) at 08/30/11 1002 Last data filed at 08/30/11 0609  Gross per 24 hour  Intake    420 ml  Output    502 ml  Net    -82 ml    General: Alert, awake, oriented x3, in no acute distress.  HEENT: No bruits, no goiter.  Heart: Regular rate and rhythm, without murmurs, rubs, gallops.  Lungs:mild ronchus, bilateral air movement.  Abdomen: Soft, nontender, nondistended, positive bowel sounds.  Neuro: Grossly intact, nonfocal. Extremities: no edema. Skin: redness gluteal area.    Lab Results:  [facility] 08/30/11 0520 08/29/11 0618  NA 141 140  K 3.8 3.6  CL 107 105  CO2 26 25  GLUCOSE 103* 102*  BUN 20 18  CREATININE 0.78 0.74  CALCIUM 9.3 9.0  MG 1.7 --  PHOS -- --    Basename 08/30/11 0520 08/29/11 0618  WBC 16.8* 16.1*  NEUTROABS -- --  HGB 13.1 13.9  HCT 39.4 42.0  MCV 96.6 96.3  PLT 389 420*    Basename 08/28/11 0700 08/28/11 0034 08/27/11 1400  CKTOTAL 25 21 21   CKMB 1.9 1.7 1.8  CKMBINDEX -- -- --  TROPONINI <0.30 <0.30 <0.30    Basename 08/27/11 1353  POCBNP 1611.0*    Micro Results: Recent Results (from the past 240 hour(s))  URINE CULTURE     Status: Normal   Collection Time   08/20/11  3:23 PM      Component Value Range Status Comment   Specimen Description URINE, CLEAN CATCH   Final    Special Requests NONE   Final    Setup Time [id number]   Final    Colony Count 20,OOO COLONIES/ML   Final    Culture ENTEROCOCCUS SPECIES   Final    Report Status 08/24/2011 FINAL   Final    Organism ID, Bacteria  ENTEROCOCCUS SPECIES   Final   CULTURE, BLOOD (ROUTINE X 2)     Status: Normal   Collection Time   08/21/11  1:00 AM      Component Value Range Status Comment   Specimen Description BLOOD LEFT ARM   Final    Special Requests BOTTLES DRAWN AEROBIC AND ANAEROBIC 10CC EA   Final    Setup Time [id number]   Final    Culture NO GROWTH 5 DAYS   Final    Report Status 08/27/2011 FINAL   Final   CULTURE, BLOOD (ROUTINE X 2)     Status: Normal   Collection Time   08/21/11  1:15 AM      Component Value Range Status Comment   Specimen Description BLOOD LEFT WRIST   Final    Special Requests BOTTLES DRAWN AEROBIC AND ANAEROBIC 10CC EA   Final    Setup Time [id number]   Final    Culture NO GROWTH 5 DAYS   Final    Report Status 08/27/2011 FINAL   Final   CLOSTRIDIUM  DIFFICILE BY PCR     Status: Abnormal   Collection Time   08/21/11  8:31 AM      Component Value Range Status Comment   C difficile by pcr POSITIVE (*) NEGATIVE  Final   STOOL CULTURE     Status: Normal   Collection Time   08/21/11  8:32 AM      Component Value Range Status Comment   Specimen Description STOOL   Final    Special Requests NONE   Final    Culture     Final    Value: NO SALMONELLA, SHIGELLA, CAMPYLOBACTER, OR YERSINIA ISOLATED   Report Status 08/25/2011 FINAL   Final   MRSA PCR SCREENING     Status: Abnormal   Collection Time   08/22/11  3:25 AM      Component Value Range Status Comment   MRSA by PCR POSITIVE (*) NEGATIVE  Final     Studies/Results: Dg Chest 2 View  08/29/2011  *RADIOLOGY REPORT*  Clinical Data: Infiltrate, follow-up  CHEST - 2 VIEW  Comparison: 08/27/2011  Findings: Enlargement of cardiac silhouette. Tortuous aorta. Pulmonary vascularity normal. Minimal bronchitic changes. Improved basilar aeration bilaterally. No definite acute infiltrate, pleural effusion or pneumothorax. Lungs minimally hyperexpanded. Bones demineralized.  IMPRESSION: Enlargement of cardiac silhouette. Improved basilar  aeration.  Original Report Authenticated By: Lollie Marrow, M.D.   Dg Esophagus  08/28/2011  *RADIOLOGY REPORT*  Clinical Data: Dysphagia.  ESOPHOGRAM/BARIUM SWALLOW  Technique:  Single contrast examination was performed using Omnipaque-300.  Fluoroscopy time:  1.44 minutes.  Comparison:  None  Findings:  Nonspecific esophageal motility disorder with occasional disruption of the primary peristaltic wave and occasional tertiary contractions.  No findings for diffuse esophageal spasm.  There is a widely patent lower esophageal mucosal ring and a very small sliding-type hiatal hernia.  No gastroesophageal reflux was demonstrated.  No intrinsic or extrinsic lesions of the esophagus were identified and no mucosal abnormalities were seen.  IMPRESSION:  1.  Nonspecific esophageal motility disorder. 2.  Widely patent lower esophageal mucosal ring. 3.  Very small sliding-type hiatal hernia. 4.  No gastroesophageal reflux was demonstrated.  Original Report Authenticated By: P. Loralie Champagne, M.D.    Medications: I have reviewed the patient's current medications.   PNA (pneumonia) (08/22/2011) Patient received a 5 days of ceftriaxone and Azithromycin. Patient on dysphagia 2 diet for aspiration precaution. Chest x ray negative for infiltrates.   HYPERTENSION (12/29/2007) Continue with Norvasc , lisinopril and metoprolol.   FIBRILLATION, ATRIAL (12/29/2007)  Heart rate in the 120 this morning , will increase metoprolol to 100 mg BID. will follow INR. Coumadin restarted today.   GERD (gastroesophageal reflux disease) (06/12/2011), Nausea: Patient on Protonix.  Seems nausea is a chronic problem. Nausea improved.  C. difficile colitis  Had 4 BM yesterday. 2 BM today so far.  Continue with Flagyl IV.  Flagyl started on November 27, date 10 of 14.  Hypokalemia (08/20/2011) resolved.  Hypomagnesemia (08/22/2011) Mg at 1.7. Will give mg oxide times 2 doses.   Acute respiratory failure (08/22/2011)  Secondary  to pneumonia and now is stable.  Repeated chest x-ray 12-05 showed improved aeration.   Diastolic CHF, acute (08/23/2011) appears euvolemic. Will consider low dose lasix.   Lethargic (08/27/2011)/Encephalopathy:  Improved.  Unclear etiology , this could be secondary to delirium, infection process. MRI negative for acute stroke . EEG pending .  Dysphagia:  Esophagogram showed motility disorder. No endoscopy at this time.  Oral Candidiasis/ gluteal, perineal candidiasis:  Continue with fluconazole, started December 4.  Day 3.  Leukocytosis: probably secondary to C diff. Monitor.     LOS: 10 days   Juda Lajeunesse M.D.  [facility] 08/30/2011, 10:02 AM

## 2011-08-30 NOTE — Progress Notes (Signed)
Utilization Review Completed.[name] T12/02/2011

## 2011-08-30 NOTE — Progress Notes (Signed)
Physical Therapy Treatment Patient Details Name: [name] MRN: [id number] DOB: 12/26/1935 Today's Date: 08/30/2011  PT Assessment/Plan  PT - Assessment/Plan Comments on Treatment Session: HR improved pt 96 at start of session and no higher than 115 during session.  Pt continues to struggle with mobility.  pt  has difficutly following verbal instruction , pt has loose stoools which also limited pt's performance.  pt has Large Red  rash  in rectal  region RN assisted PT in cleaning pt and applied barrier cream to affected area.   PT Plan: Discharge plan remains appropriate PT Frequency: Min 3X/week Follow Up Recommendations: Skilled nursing facility Equipment Recommended: Defer to next venue PT Goals  Acute Rehab PT Goals Time For Goal Achievement: 2 weeks Pt will go Supine/Side to Sit: with min assist PT Goal: Supine/Side to Sit - Progress: Progressing toward goal Pt will Sit at [facility] of Bed: with supervision;6-10 min;with no upper extremity support PT Goal: Sit at Edge Of Bed - Progress: Progressing toward goal Pt will go Sit to Supine/Side: with mod assist PT Goal: Sit to Supine/Side - Progress: Progressing toward goal Pt will go Sit to Stand: with mod assist PT Goal: Sit to Stand - Progress: Progressing toward goal Pt will go Stand to Sit: with mod assist PT Goal: Stand to Sit - Progress: Progressing toward goal Pt will Transfer Bed to Chair/Chair to Bed: with supervision PT Transfer Goal: Bed to Chair/Chair to Bed - Progress: Progressing toward goal Pt will Ambulate: 51 - 150 feet;with least restrictive assistive device;Other (comment) PT Goal: Ambulate - Progress: Progressing toward goal Pt will Go Up / Down Stairs: 1-2 stairs;with supervision;with rail(s) PT Goal: Up/Down Stairs - Progress:  (not addressed this session.  )  PT Treatment Precautions/Restrictions  Precautions Precautions: Fall;Other (comment) (Contact C-DIFF) Required Braces or Orthoses:  No Restrictions Weight Bearing Restrictions: No Mobility (including Balance) Bed Mobility Bed Mobility: Yes Rolling Right: 4: Min assist;Patient percentage (comment);With rail (pt 80%) Rolling Right Details (indicate cue type and reason): verbal and tactile cues to bend knees and reach for rail with contralateral UE.   Rolling Left: 4: Min assist;Patient percentage (comment);With rail (pt 80%) Rolling Left Details (indicate cue type and reason): verbal and tactile cues to bend knees and reach for rail with contralateral UE.  Left Sidelying to Sit: 1: +1 Total assist;With rails;Patient percentage (comment);HOB flat (pt 25%) Left Sidelying to Sit Details (indicate cue type and reason): Assist for bil LE and assist to raise shoulders from bed while preventing posterior lean.  Pt did initiate task but had difficulty with problem solving how to get her legs off the bed.   Supine to Sit: Not tested (comment) (15 degrees) Sit to Supine - Left: Patient percentage (comment);1: +2 Total assist;HOB flat Sit to Supine - Left Details (indicate cue type and reason): pt required mod assist to control trunk to prevent pt from falling back into bed.  Tactile cues through Lt Elbow and shoulder to reinforce the proper technique.  Max assist for BIlateral LE management.   Scooting to [facility]: Not tested (comment) Transfers Transfers: Yes Sit to Stand: 2: Max assist;From elevated surface;3: Mod assist;With upper extremity assist;From bed (two trials Mod Assist trial 1, Max assist trial 2) Sit to Stand Details (indicate cue type and reason): vc's for hand placement; manual A for forward w/shift.  Pt avoids weight bearing through Rt LE.  Spouse reports pt has had difficulty with Rt LE since CVA.  Pt stood at bedside with  verbal and tactile cues to center her wt over bil LEs .  Pt presents with excessive Rt lateral leand and flexed trunk posture.   Stand to Sit: 2: Max assist;To bed;To elevated surface;Without upper  extremity assist;Patient percentage (comment) (pt 35%) Stand to Sit Details: Verbal and tactile cues for controlled descent to bed.  Pt sits prematurelly due to fatigue.   Ambulation/Gait Ambulation/Gait: No Stairs: No Wheelchair Mobility Wheelchair Mobility: No  Posture/Postural Control Posture/Postural Control: Postural limitations Postural Limitations: Rt lateral lean in sitting and standing.  pt also presents with flexed trunk posture in standing.  Balance Balance Assessed: Yes Static Sitting Balance Static Sitting - Balance Support: Feet supported;Right upper extremity supported;Left upper extremity supported Static Sitting - Level of Assistance: 3: Mod assist;Patient percentage (comment) (pt 60% ) Static Sitting - Comment/# of Minutes: pt sat on EOB 10 minutes with Rt lateral lean required constant verbal and tactile cues to correct positon, and pt only able to maintain equillibrium for a few seconds. before leaning to right again.   Exercise    End of Session PT - End of Session Equipment Utilized During Treatment: Gait belt Activity Tolerance: Patient limited by fatigue;Other (comment) (pt had two bowel movement during session .  ) Patient left: in bed;with call bell in reach Nurse Communication: Mobility status for transfers General Behavior During Session: [facility] for tasks performed Cognition: Impaired, at baseline Cognitive Impairment: Impaired attention and difficulty following commands, pt. with history of aphasia.   [name] L. [name] DPT [phone] [name] 08/30/2011, 5:27 PM

## 2011-08-30 NOTE — Progress Notes (Signed)
ANTICOAGULATION CONSULT NOTE - Follow Up Consult  Pharmacy Consult for Coumadin Indication:  A.Fib  Allergies  Allergen Reactions  . Ciprofloxacin     REACTION: diarrhea/yeast infection  . Penicillins Hives    Patient Measurements: Height: 5\' 6"  (167.6 cm) Weight: 166 lb 10.7 oz (75.6 kg) IBW/kg (Calculated) : 59.3    Vital Signs: Temp: 98.3 F (36.8 C) (12/06 0737) Temp src: Oral (12/06 0737) BP: 136/80 mmHg (12/06 0737) Pulse Rate: 119  (12/06 0737)  Labs:  Basename 08/30/11 0520 08/29/11 0618 08/28/11 0700 08/28/11 0034 08/27/11 1400  HGB 13.1 13.9 -- -- --  HCT 39.4 42.0 41.6 -- --  PLT 389 420* 366 -- --  APTT -- -- -- -- --  LABPROT 25.3* 28.6* 33.2* -- --  INR 2.26* 2.64* 3.19* -- --  HEPARINUNFRC -- -- -- -- --  CREATININE 0.78 0.74 0.78 -- --  CKTOTAL -- -- 25 21 21   CKMB -- -- 1.9 1.7 1.8  TROPONINI -- -- <0.30 <0.30 <0.30   Estimated Creatinine Clearance: 65.1 ml/min (by C-G formula based on Cr of 0.78).   Medications:  Scheduled:     . amLODipine  10 mg Oral Daily  . carbamazepine  200 mg Oral BID  . estradiol  2 g Vaginal 3 times weekly  . feeding supplement  1 Container Oral BID BM  . fluconazole (DIFLUCAN) IV  100 mg Intravenous Q24H  . insulin aspart  0-9 Units Subcutaneous Q4H  . lamoTRIgine  25 mg Oral BID  . lisinopril  40 mg Oral Daily  . magic mouthwash  15 mL Oral TID  . metoprolol tartrate  100 mg Oral BID  . metronidazole  500 mg Intravenous Q8H  . pantoprazole  40 mg Oral QAC breakfast  . warfarin  1 mg Oral ONCE-1800  . DISCONTD: metoprolol tartrate  50 mg Oral BID  . DISCONTD: pantoprazole (PROTONIX) IV  40 mg Intravenous Q24H    Assessment: 75yo female on Coumadin for A.Fib.  INR=2.26  this AM.   Hg stable, no bleeding issues noted.  Goal of Therapy:  INR 2-3   Plan:  1.  Coumadin 2 mg po x 1 dose today 2.  Follow-up AM INR   Elwin Sleight 08/30/2011,9:04 AM

## 2011-08-30 NOTE — Progress Notes (Signed)
This patient was discussed at long LOS rounds 12.05.12  

## 2011-08-31 ENCOUNTER — Inpatient Hospital Stay (HOSPITAL_COMMUNITY): Payer: Medicare Other

## 2011-08-31 LAB — GLUCOSE, CAPILLARY
Glucose-Capillary: 93 mg/dL (ref 70–99)
Glucose-Capillary: 111 mg/dL — ABNORMAL HIGH (ref 70–99)

## 2011-08-31 LAB — BASIC METABOLIC PANEL
BUN: 16 mg/dL (ref 6–23)
GFR calc Af Amer: 83 mL/min — ABNORMAL LOW (ref >90)
GFR calc non Af Amer: 71 mL/min — ABNORMAL LOW (ref >90)
Potassium: 3.7 mEq/L (ref 3.5–5.1)
Sodium: 140 mEq/L (ref 135–145)

## 2011-08-31 LAB — CBC
MCHC: 33.1 g/dL (ref 30.0–36.0)
Platelets: 380 10*3/uL (ref 150–400)
RDW: 14.1 % (ref 11.5–15.5)

## 2011-08-31 LAB — PROTIME-INR
INR: 2.56 — ABNORMAL HIGH (ref 0.00–1.49)
Prothrombin Time: 27.9 seconds — ABNORMAL HIGH (ref 11.6–15.2)

## 2011-08-31 MED ORDER — METRONIDAZOLE 500 MG PO TABS
500.0000 mg | ORAL_TABLET | Freq: Three times a day (TID) | ORAL | Status: DC
  Administered 2011-08-31 – 2011-09-02 (×6): 500 mg via ORAL
  Filled 2011-08-31 (×9): qty 1

## 2011-08-31 MED ORDER — WARFARIN SODIUM 1 MG PO TABS
1.0000 mg | ORAL_TABLET | Freq: Once | ORAL | Status: AC
  Administered 2011-08-31: 21:00:00 1 mg via ORAL

## 2011-08-31 MED ORDER — METRONIDAZOLE 500 MG PO TABS
500.0000 mg | ORAL_TABLET | Freq: Three times a day (TID) | ORAL | Status: DC
  Administered 2011-08-31: 12:00:00 500 mg via ORAL
  Filled 2011-08-31 (×3): qty 1

## 2011-08-31 MED ORDER — WARFARIN SODIUM 1 MG PO TABS
1.0000 mg | ORAL_TABLET | Freq: Once | ORAL | Status: DC
  Filled 2011-08-31: qty 1

## 2011-08-31 MED ORDER — FLUCONAZOLE 100 MG PO TABS
100.0000 mg | ORAL_TABLET | Freq: Every day | ORAL | Status: DC
  Administered 2011-08-31 – 2011-09-04 (×5): 100 mg via ORAL
  Filled 2011-08-31 (×6): qty 1

## 2011-08-31 MED ORDER — METRONIDAZOLE IN NACL 5-0.79 MG/ML-% IV SOLN: 500.0000 mg | Freq: Three times a day (TID) | INTRAVENOUS | Status: DC

## 2011-08-31 NOTE — Progress Notes (Signed)
*  PRELIMINARY RESULTS* EEG has been performed by Rosalie Doctor.  Markus Jarvis 08/31/2011, 11:37 AM

## 2011-08-31 NOTE — Progress Notes (Signed)
Subjective: Had 4 BM today. Loose stool. denies abdominal pain. No dyspnea or chest pain.  Objective: Filed Vitals:   08/31/11 0426 08/31/11 0840 08/31/11 1006 08/31/11 1249  BP: 162/83 160/90 156/81 131/69  Pulse: 95  112 83  Temp: 98 F (36.7 C)  97.7 F (36.5 C) 98.1 F (36.7 C)  TempSrc: Oral  Oral Oral  Resp: 17 21 23 18   Height:      Weight:   71.5 kg (157 lb 10.1 oz)   SpO2: 94%  95% 98%   Weight change:   Intake/Output Summary (Last 24 hours) at 08/31/11 1632 Last data filed at 08/31/11 1350  Gross per 24 hour  Intake    980 ml  Output    653 ml  Net    327 ml    General: Alert, awake, oriented x3, in no acute distress.  HEENT: No bruits, no goiter.  Heart: Regular rate and rhythm, without murmurs, rubs, gallops.  Lungs: Crackles left side, bilateral air movement.  Abdomen: Soft, nontender, nondistended, positive bowel sounds.  Neuro: Grossly intact, nonfocal. Extremities: no edema. Skin: erythema perineal and gluteal area mild improved.    Lab Results:  [facility] 08/31/11 0615 08/30/11 0520  NA 140 141  K 3.7 3.8  CL 106 107  CO2 28 26  GLUCOSE 107* 103*  BUN 16 20  CREATININE 0.80 0.78  CALCIUM 9.1 9.3  MG -- 1.7  PHOS -- --   Basename 08/31/11 0615 08/30/11 0520  WBC 13.4* 16.8*  NEUTROABS -- --  HGB 13.5 13.1  HCT 40.8 39.4  MCV 96.7 96.6  PLT 380 389    Micro Results: Recent Results (from the past 240 hour(s))  MRSA PCR SCREENING     Status: Abnormal   Collection Time   08/22/11  3:25 AM      Component Value Range Status Comment   MRSA by PCR POSITIVE (*) NEGATIVE  Final     Studies/Results: Dg Chest 2 View  08/29/2011  *RADIOLOGY REPORT*  Clinical Data: Infiltrate, follow-up  CHEST - 2 VIEW  Comparison: 08/27/2011  Findings: Enlargement of cardiac silhouette. Tortuous aorta. Pulmonary vascularity normal. Minimal bronchitic changes. Improved basilar aeration bilaterally. No definite acute infiltrate, pleural effusion or pneumothorax.  Lungs minimally hyperexpanded. Bones demineralized.  IMPRESSION: Enlargement of cardiac silhouette. Improved basilar aeration.  Original Report Authenticated By: Lollie Marrow, M.D.    Medications: I have reviewed the patient's current medications.   Patient Active Hospital Problem List:   PNA (pneumonia) (08/22/2011) Patient received a 5 days of ceftriaxone and Azithromycin. Patient on dysphagia 2 diet for aspiration precaution. Chest x ray negative for infiltrates on 12-05.   HYPERTENSION (12/29/2007) Continue with Norvasc , lisinopril and metoprolol.   FIBRILLATION, ATRIAL (12/29/2007)  Heart rate better controlled. , metoprolol was increased  to 100 mg BID on 12-06. will follow INR. Coumadin restarted. Pharmacy please carefull with coumadin dose due to interaction with flagyl.  GERD (gastroesophageal reflux disease) (06/12/2011), Nausea: Patient on Protonix.  Seems nausea is a chronic problem. Nausea improved.  C. difficile colitis  Had 4 BM yesterday. 4 today. WBC decreasing.  Continue with Flagyl change to PO.  Flagyl started on November 27, date 11 of 14.   Hypokalemia (08/20/2011) resolved.  Hypomagnesemia (08/22/2011)  Mg at 1.7. 12-06 . Recieved mg oxide times 2 doses.  Repeat in Am.   Acute respiratory failure (08/22/2011)  Secondary to pneumonia and now is stable.  Repeated chest x-ray 12-05 showed improved aeration.  Diastolic CHF, acute (08/23/2011) appears euvolemic.  I will hold on lasix, Patient negative -5L   Lethargic (08/27/2011)/Encephalopathy:  Resolved.  Unclear etiology , this could be secondary to delirium, infection process. MRI negative for acute stroke . EEG pending . Cardiac enzymes negative.  Dysphagia:  Esophagogram showed motility disorder. No endoscopy at this time per GI.  Oral Candidiasis/ gluteal, perineal candidiasis:  Continue with fluconazole, started December 4.  Day 4. Still with foley in place due to risk of worsening skin infection,.  Patient weak. Leukocytosis: probably secondary to C diff. WBC decreasing.      LOS: 11 days   REGALADO,BELKYS M.D.  [facility] 08/31/2011, 4:32 PM

## 2011-08-31 NOTE — Progress Notes (Signed)
eeg completed ° °

## 2011-08-31 NOTE — Progress Notes (Signed)
ANTICOAGULATION CONSULT NOTE - Follow Up Consult  Pharmacy Consult for Coumadin Indication:  A.Fib  Allergies  Allergen Reactions  . Ciprofloxacin     REACTION: diarrhea/yeast infection  . Penicillins Hives    Patient Measurements: Height: 5\' 6"  (167.6 cm) Weight: 166 lb 10.7 oz (75.6 kg) IBW/kg (Calculated) : 59.3    Vital Signs: Temp: 98 F (36.7 C) (12/07 0426) Temp src: Oral (12/07 0426) BP: 160/90 mmHg (12/07 0840) Pulse Rate: 95  (12/07 0426)  Labs:  Basename 08/31/11 0615 08/30/11 0520 08/29/11 0618  HGB 13.5 13.1 --  HCT 40.8 39.4 42.0  PLT 380 389 420*  APTT -- -- --  LABPROT 27.9* 25.3* 28.6*  INR 2.56* 2.26* 2.64*  HEPARINUNFRC -- -- --  CREATININE 0.80 0.78 0.74  CKTOTAL -- -- --  CKMB -- -- --  TROPONINI -- -- --   Estimated Creatinine Clearance: 65.1 ml/min (by C-G formula based on Cr of 0.8).   Medications:  Scheduled:     . amLODipine  10 mg Oral Daily  . carbamazepine  200 mg Oral BID  . estradiol  2 g Vaginal 3 times weekly  . feeding supplement  1 Container Oral BID BM  . fluconazole (DIFLUCAN) IV  100 mg Intravenous Q24H  . insulin aspart  0-9 Units Subcutaneous Q4H  . lamoTRIgine  25 mg Oral BID  . lisinopril  40 mg Oral Daily  . magic mouthwash  15 mL Oral TID  . magnesium oxide  200 mg Oral Daily  . metoprolol tartrate  100 mg Oral BID  . metronidazole  500 mg Intravenous Q8H  . pantoprazole  40 mg Oral QAC breakfast  . warfarin  2 mg Oral ONCE-1800    Assessment: 75yo female on Coumadin for A.Fib.  INR=2.56  this AM.   Hg stable, no bleeding issues noted.  Goal of Therapy:  INR 2-3   Plan:  1.  Coumadin 1 mg po x 1 dose today 2.  Follow-up AM INR 3) Change Diflucan and Flagyl to po.  Thank you.   Elwin Sleight 08/31/2011,9:08 AM

## 2011-08-31 NOTE — Progress Notes (Signed)
[facility] returned phone call and stated that no available beds. CSW called dtr Selena Batten) and explained no available space at [facility] and dtr accepted bed at [facility]. CSW provided dtr with SNF phone number to ask them specific questions. CSW was informed during progression meeting that patient would most likely dc on Monday. CSW will continue to follow. [place], [place] [phone]

## 2011-08-31 NOTE — Progress Notes (Signed)
Speech Language/Pathology Speech Pathology: Dysphagia Treatment Note  Patient was observed with :  Thin liquids.  Patient was noted to have s/s of aspiration : No:    Lung Sounds:  Diminished  Temperature: afebrile  Patient required: Min verbal and visual cues to consistently follow precautions/strategies - small sips. No s/s of aspiration observed. Pt refused solid POs. Oral cavity red but moist, very little white coating.  RN reports pt toelrating thin liquds  Clinical Impression: Pt tolerating current diet without overt aspiration.  Oral hygiene improving, likely to improve sensation for timing of swallow response.  Recommendations:  Continue current diet, continue full supervision.   Pain:   none Intervention Required:   No   Goals: Goals Partially Met  Harlon Ditty, MA CCC-SLP [phone]

## 2011-09-01 DIAGNOSIS — R197 Diarrhea, unspecified: Secondary | ICD-10-CM

## 2011-09-01 DIAGNOSIS — A0472 Enterocolitis due to Clostridium difficile, not specified as recurrent: Secondary | ICD-10-CM

## 2011-09-01 LAB — CBC
HCT: 38.9 % (ref 36.0–46.0)
Hemoglobin: 12.8 g/dL (ref 12.0–15.0)
MCV: 96 fL (ref 78.0–100.0)
RBC: 4.05 MIL/uL (ref 3.87–5.11)
WBC: 12 10*3/uL — ABNORMAL HIGH (ref 4.0–10.5)

## 2011-09-01 LAB — GLUCOSE, CAPILLARY
Glucose-Capillary: 102 mg/dL — ABNORMAL HIGH (ref 70–99)
Glucose-Capillary: 113 mg/dL — ABNORMAL HIGH (ref 70–99)
Glucose-Capillary: 91 mg/dL (ref 70–99)
Glucose-Capillary: 97 mg/dL (ref 70–99)

## 2011-09-01 LAB — BASIC METABOLIC PANEL
BUN: 17 mg/dL (ref 6–23)
CO2: 27 mEq/L (ref 19–32)
Chloride: 101 mEq/L (ref 96–112)
Creatinine, Ser: 0.87 mg/dL (ref 0.50–1.10)
Potassium: 3.5 mEq/L (ref 3.5–5.1)

## 2011-09-01 LAB — MAGNESIUM: Magnesium: 1.5 mg/dL (ref 1.5–2.5)

## 2011-09-01 MED ORDER — WARFARIN 0.5 MG HALF TABLET
0.5000 mg | ORAL_TABLET | Freq: Once | ORAL | Status: DC
  Filled 2011-09-01: qty 1

## 2011-09-01 MED ORDER — FLEET ENEMA 7-19 GM/118ML RE ENEM
1.0000 | ENEMA | Freq: Once | RECTAL | Status: DC
  Filled 2011-09-01: qty 1

## 2011-09-01 MED ORDER — FLORA-Q PO CAPS
1.0000 | ORAL_CAPSULE | Freq: Every day | ORAL | Status: DC
  Administered 2011-09-01 – 2011-09-04 (×3): 1 via ORAL
  Filled 2011-09-01 (×4): qty 1

## 2011-09-01 MED ORDER — FLEET ENEMA 7-19 GM/118ML RE ENEM
1.0000 | ENEMA | Freq: Once | RECTAL | Status: AC
  Administered 2011-09-02: 06:00:00 1 via RECTAL
  Filled 2011-09-01: qty 1

## 2011-09-01 NOTE — Progress Notes (Signed)
ANTICOAGULATION CONSULT NOTE - Follow Up Consult  Pharmacy Consult for Coumadin Indication: atrial fibrillation  Allergies  Allergen Reactions  . Ciprofloxacin     REACTION: diarrhea/yeast infection  . Penicillins Hives    Patient Measurements: Height: 5\' 6"  (167.6 cm) Weight: 157 lb 10.1 oz (71.5 kg) IBW/kg (Calculated) : 59.3    Vital Signs: Temp: 98.9 F (37.2 C) (12/08 1225) Temp src: Oral (12/08 1225) BP: 142/76 mmHg (12/08 1225) Pulse Rate: 87  (12/08 1225)  Labs:  [facility] 09/01/11 0807 08/31/11 0615 08/30/11 0520  HGB 12.8 13.5 --  HCT 38.9 40.8 39.4  PLT 365 380 389  APTT -- -- --  LABPROT 28.4* 27.9* 25.3*  INR 2.62* 2.56* 2.26*  HEPARINUNFRC -- -- --  CREATININE 0.87 0.80 0.78  CKTOTAL -- -- --  CKMB -- -- --  TROPONINI -- -- --   Estimated Creatinine Clearance: 58.4 ml/min (by C-G formula based on Cr of 0.87).   Medications:  Scheduled:    . amLODipine  10 mg Oral Daily  . carbamazepine  200 mg Oral BID  . estradiol  2 g Vaginal 3 times weekly  . feeding supplement  1 Container Oral BID BM  . Flora-Q  1 capsule Oral Daily  . fluconazole  100 mg Oral Daily  . insulin aspart  0-9 Units Subcutaneous Q4H  . lamoTRIgine  25 mg Oral BID  . lisinopril  40 mg Oral Daily  . magic mouthwash  15 mL Oral TID  . metoprolol tartrate  100 mg Oral BID  . metroNIDAZOLE  500 mg Oral Q8H  . pantoprazole  40 mg Oral QAC breakfast  . sodium phosphate  1 enema Rectal Once  . warfarin  1 mg Oral ONCE-1800    Assessment: Patient is a 75 y/o female on Coumadin for A-fib.   She is also currently on Flagyl, a potent Coumadin potentiator. INR has gradually risen.  Still within therapeutic range, however.  Goal of Therapy:  INR 2-3   Plan:  Coumadin 0.5mg  today.  Aixa Corsello, Elisha Headland, Pharm.D. 09/01/2011 5:10 PM

## 2011-09-01 NOTE — Progress Notes (Signed)
[facility] Progress Note  Subjective: Recalled to see patient regarding diarrhea/loose stools.  Pt reports ongoing diarrhea, 4-5 loose stools/day.  RN states stools are watery with no form.  Nonbloody.  No melena.  Pt reports mild lower abd pain/cramping.  Some nausea assoc with flagyl admin. No fevers.    Objective:  Vital signs in last 24 hours: Temp:  [97.4 F (36.3 C)-99 F (37.2 C)] 98.9 F (37.2 C) (12/08 1225) Pulse Rate:  [84-108] 87  (12/08 1225) Resp:  [19-22] 21  (12/08 1225) BP: (142-156)/(76-96) 142/76 mmHg (12/08 1225) SpO2:  [92 %-98 %] 95 % (12/08 1225) Last BM Date: 09/01/11 General:   Alert,  Chronically ill appearing, NAD Heart:  Regular rate and rhythm; no murmurs Abdomen:  Soft, mildly distended, nontender. Normal bowel sounds, without guarding, and without rebound.   Extremities:  Without edema. Neurologic:  Alert, stable aphasia Psych:  Alert and cooperative. Normal mood and affect.  Intake/Output from previous day: 12/07 0701 - 12/08 0700 In: 930 [P.O.:930] Out: 1056 [Urine:1050; Stool:6] Intake/Output this shift: Total I/O In: 240 [P.O.:240] Out: 2 [Stool:2]  Lab Results:  Basename 09/01/11 0807 08/31/11 0615 08/30/11 0520  WBC 12.0* 13.4* 16.8*  HGB 12.8 13.5 13.1  HCT 38.9 40.8 39.4  PLT 365 380 389   BMET  Basename 09/01/11 0807 08/31/11 0615 08/30/11 0520  NA 136 140 141  K 3.5 3.7 3.8  CL 101 106 107  CO2 27 28 26  GLUCOSE 96 107* 103*  BUN 17 16 20  CREATININE 0.87 0.80 0.78  CALCIUM 8.8 9.1 9.3   PT/INR  Basename 09/01/11 0807 08/31/11 0615  LABPROT 28.4* 27.9*  INR 2.62* 2.56*    Assessment / Plan: 76 yo with multiple med problems, admitted with c diff colitis (seen earlier in this hospital stay for dysphagia symptoms) now with ongoing diarrhea/loose stools.  1. C diff - day 12 of 14 on IV flagyl.  Some improvement compared to admission, but stools remain nonformed and very loose.  Some fecal seepage.  WBC  still elevated, though only slightly.  Question is, has the c diff resolved on flagyl and/or is there another process ongoing. --Will schedule flex sig with sedation for tomorrow.  This will help answer the question of ongoing infection/inflammation. --Hold antidiarrheals for now, until after flex sig --On warfarin, will not be able to perform polypectomy should that be necessary --NPO after MN, 1 enema for prep. --discussed with daughter, Kim, by phone and explained risks/benefits.    LOS: 12 days   Adaleah Forget M  09/01/2011, 3:38 PM    

## 2011-09-01 NOTE — Progress Notes (Signed)
Subjective:  Sitting in bed in not acute distress. Feeling better. Had 4 to 5 BM yesterday today so far 2. Still loose. Nausea much improved. Denies Shortness of breath or chest pain.   Objective: Filed Vitals:   08/31/11 2101 09/01/11 0010 09/01/11 0430 09/01/11 0902  BP: 152/79 149/84 150/94 156/96  Pulse: 104 91 84 103  Temp:  98.5 F (36.9 C) 98.8 F (37.1 C) 97.4 F (36.3 C)  TempSrc:  Oral Oral Oral  Resp:  21 22 20   Height:      Weight:      SpO2:  93% 92% 95%   Weight change:   Intake/Output Summary (Last 24 hours) at 09/01/11 1048 Last data filed at 09/01/11 0654  Gross per 24 hour  Intake    690 ml  Output   1056 ml  Net   -366 ml    General: Alert, awake, oriented x3, in no acute distress.  HEENT: No bruits, no goiter.  Heart: IRegular rate and rhythm, without murmurs, rubs, gallops.  Lungs: CTA, bilateral air movement.  Abdomen: Soft, nontender, nondistended, positive bowel sounds.  Neuro: Grossly intact, nonfocal. Extremities: no edema.   Lab Results:  Basename 09/01/11 0807 08/31/11 0615 08/30/11 0520  NA 136 140 --  K 3.5 3.7 --  CL 101 106 --  CO2 27 28 --  GLUCOSE 96 107* --  BUN 17 16 --  CREATININE 0.87 0.80 --  CALCIUM 8.8 9.1 --  MG 1.5 -- 1.7  PHOS -- -- --    Basename 09/01/11 0807 08/31/11 0615  WBC 12.0* 13.4*  NEUTROABS -- --  HGB 12.8 13.5  HCT 38.9 40.8  MCV 96.0 96.7  PLT 365 380    Micro Results: No results found for this or any previous visit (from the past 240 hour(s)).  Studies/Results: No results found.  Medications: I have reviewed the patient's current medications.  PNA (pneumonia) (08/22/2011) Patient received a 5 days of ceftriaxone and Azithromycin. Patient on dysphagia 2 diet for aspiration precaution. Chest x ray negative for infiltrates on 12-05.   HYPERTENSION (12/29/2007) Continue with Norvasc , lisinopril and metoprolol.   FIBRILLATION, ATRIAL (12/29/2007)  Heart rate better controlled. , metoprolol  was increased to 100 mg BID on 12-06. will follow INR. Coumadin restarted. Pharmacy please carefull with coumadin dose due to interaction with flagyl.   GERD (gastroesophageal reflux disease) (06/12/2011), Nausea: Patient on Protonix, will consider discontinue if ok with Gi.  Seems nausea is a chronic problem. Nausea much improved.   C. difficile colitis  Had 4 BM yesterday. 2 today. WBC decreasing.  Continue with Flagyl change to PO.  Flagyl started on November 27, date 12 of 14.  I wil start Flara Q. I ask GI to see patient again because of persistent diarrhea and  Patient finishing course of flagyl.   Hypokalemia (08/20/2011) resolved.  Hypomagnesemia (08/22/2011)  Mg at 1.7. 12-06 . Recieved mg oxide times 2 doses.  Repeat in Am.   Acute respiratory failure (08/22/2011)  Secondary to pneumonia and now is stable.  Repeated chest x-ray 12-05 showed improved aeration.   Diastolic CHF, acute (08/23/2011) appears euvolemic.  I will hold on lasix, Patient negative - balance.   Lethargic (08/27/2011)/Encephalopathy:  Resolved.  Unclear etiology , this could be secondary to delirium, infection process. MRI negative for acute stroke . EEG pending . Cardiac enzymes negative.  Dysphagia:  Esophagogram showed motility disorder. No endoscopy at this time per GI.  Oral Candidiasis/ gluteal, perineal candidiasis:  Continue with fluconazole, started December 4.  Day 5. Still with foley in place due to risk of worsening skin infection,. Patient weak.  Leukocytosis: probably secondary to C diff. WBC decreasing.  Daughter updated on daily basis.      LOS: 12 days   Herrick Hartog M.D.  [facility] 09/01/2011, 10:48 AM

## 2011-09-02 ENCOUNTER — Encounter (HOSPITAL_COMMUNITY)
Admission: EM | Disposition: A | Discharge status: Skilled Nursing Facility | Payer: Self-pay | Source: Ambulatory Visit | Attending: Internal Medicine

## 2011-09-02 ENCOUNTER — Encounter (HOSPITAL_COMMUNITY): Payer: Self-pay | Admitting: *Deleted

## 2011-09-02 DIAGNOSIS — A0472 Enterocolitis due to Clostridium difficile, not specified as recurrent: Secondary | ICD-10-CM | POA: Diagnosis present

## 2011-09-02 DIAGNOSIS — R197 Diarrhea, unspecified: Secondary | ICD-10-CM

## 2011-09-02 HISTORY — PX: FLEXIBLE SIGMOIDOSCOPY: SHX5431

## 2011-09-02 LAB — PROTIME-INR
INR: 2.3 — ABNORMAL HIGH (ref 0.00–1.49)
Prothrombin Time: 25.7 seconds — ABNORMAL HIGH (ref 11.6–15.2)

## 2011-09-02 LAB — GLUCOSE, CAPILLARY
Glucose-Capillary: 136 mg/dL — ABNORMAL HIGH (ref 70–99)
Glucose-Capillary: 115 mg/dL — ABNORMAL HIGH (ref 70–99)

## 2011-09-02 SURGERY — SIGMOIDOSCOPY, FLEXIBLE
Anesthesia: Moderate Sedation

## 2011-09-02 MED ORDER — WARFARIN SODIUM 1 MG PO TABS
1.0000 mg | ORAL_TABLET | Freq: Once | ORAL | Status: AC
  Administered 2011-09-02: 1 mg via ORAL
  Filled 2011-09-02: qty 1

## 2011-09-02 MED ORDER — METRONIDAZOLE 500 MG PO TABS
500.0000 mg | ORAL_TABLET | Freq: Two times a day (BID) | ORAL | Status: DC
  Filled 2011-09-02: qty 1

## 2011-09-02 MED ORDER — INSULIN ASPART 100 UNIT/ML ~~LOC~~ SOLN
0.0000 [IU] | Freq: Three times a day (TID) | SUBCUTANEOUS | Status: DC
  Administered 2011-09-03: 19:00:00 1 [IU] via SUBCUTANEOUS

## 2011-09-02 MED ORDER — SODIUM CHLORIDE 0.9 % IV SOLN
Freq: Once | INTRAVENOUS | Status: AC
  Administered 2011-09-02: 09:00:00 500 mL via INTRAVENOUS

## 2011-09-02 MED ORDER — FENTANYL CITRATE 0.05 MG/ML IJ SOLN
INTRAMUSCULAR | Status: DC | PRN
  Administered 2011-09-02 (×2): 25 ug via INTRAVENOUS

## 2011-09-02 MED ORDER — FENTANYL CITRATE 0.05 MG/ML IJ SOLN
INTRAMUSCULAR | Status: AC
  Filled 2011-09-02: qty 2

## 2011-09-02 MED ORDER — LOPERAMIDE HCL 2 MG PO CAPS: 2.0000 mg | ORAL_CAPSULE | Freq: Four times a day (QID) | ORAL | Status: DC | PRN

## 2011-09-02 MED ORDER — MIDAZOLAM HCL 10 MG/2ML IJ SOLN
INTRAMUSCULAR | Status: AC
  Filled 2011-09-02: qty 2

## 2011-09-02 MED ORDER — MIDAZOLAM HCL 10 MG/2ML IJ SOLN
INTRAMUSCULAR | Status: DC | PRN
  Administered 2011-09-02 (×2): 2 mg via INTRAVENOUS

## 2011-09-02 MED ORDER — DIPHENHYDRAMINE HCL 50 MG/ML IJ SOLN
INTRAMUSCULAR | Status: AC
  Filled 2011-09-02: qty 1

## 2011-09-02 MED ORDER — NYSTATIN 100000 UNIT/GM EX POWD
Freq: Three times a day (TID) | CUTANEOUS | Status: DC
  Administered 2011-09-02 – 2011-09-04 (×6): via TOPICAL
  Filled 2011-09-02: qty 15

## 2011-09-02 MED ORDER — METRONIDAZOLE 500 MG PO TABS
500.0000 mg | ORAL_TABLET | Freq: Three times a day (TID) | ORAL | Status: AC
  Administered 2011-09-02 (×2): 500 mg via ORAL
  Filled 2011-09-02 (×2): qty 1

## 2011-09-02 NOTE — Progress Notes (Signed)
[name] [id number] is a 75 y.o. female,  Outpatient Primary MD for the patient is MCNEILL,WENDY, MD, MD  Chief Complaint  Patient presents with  . Altered Mental Status        Subjective:   [name] today has, No headache, No chest pain, No abdominal pain - No Nausea, No new weakness tingling or numbness, No Cough - SOB.    Objective:   Filed Vitals:   09/02/11 0915 09/02/11 0920 09/02/11 0930 09/02/11 0940  BP: 132/89 132/89 137/79 152/67  Pulse:      Temp:      TempSrc:      Resp:  93 23 10  Height:      Weight:      SpO2:  98% 98% 94%    Wt Readings from Last 3 Encounters:  08/31/11 71.5 kg (157 lb 10.1 oz)  08/31/11 71.5 kg (157 lb 10.1 oz)  08/01/11 74.844 kg (165 lb)     Intake/Output Summary (Last 24 hours) at 09/02/11 1145 Last data filed at 09/02/11 0900  Gross per 24 hour  Intake    100 ml  Output   1653 ml  Net  -1553 ml    Exam Awake Alert, Oriented X 2, No new F.N deficits, Normal affect, ? Mild delirium [place].AT,PERRAL Supple Neck,No JVD, No cervical lymphadenopathy appriciated.  Symmetrical Chest wall movement, Good air movement bilaterally, CTAB RRR,No Gallops,Rubs or new Murmurs, No Parasternal Heave +ve B.Sounds, Abd Soft, Non tender, No organomegaly appriciated, No rebound -guarding or rigidity. No Cyanosis, Clubbing or edema, No new Rash or bruise     Data Review  CBC  Lab 09/01/11 0807 08/31/11 0615 08/30/11 0520 08/29/11 0618 08/28/11 0700  WBC 12.0* 13.4* 16.8* 16.1* 17.3*  HGB 12.8 13.5 13.1 13.9 13.8  HCT 38.9 40.8 39.4 42.0 41.6  PLT 365 380 389 420* 366  MCV 96.0 96.7 96.6 96.3 97.7  MCH 31.6 32.0 32.1 31.9 32.4  MCHC 32.9 33.1 33.2 33.1 33.2  RDW 14.1 14.1 14.1 13.8 14.0  LYMPHSABS -- -- -- -- --  MONOABS -- -- -- -- --  EOSABS -- -- -- -- --  BASOSABS -- -- -- -- --  BANDABS -- -- -- -- --    Chemistries   Lab 09/01/11 0807 08/31/11 0615 08/30/11 0520 08/29/11 0618 08/28/11 0700  NA 136 140 141  140 141  K 3.5 3.7 3.8 3.6 3.9  CL 101 106 107 105 105  CO2 27 28 26 25 25   GLUCOSE 96 107* 103* 102* 97  BUN 17 16 20 18 19   CREATININE 0.87 0.80 0.78 0.74 0.78  CALCIUM 8.8 9.1 9.3 9.0 9.0  MG 1.5 -- 1.7 -- --  AST -- -- -- -- --  ALT -- -- -- -- --  ALKPHOS -- -- -- -- --  BILITOT -- -- -- -- --   ------------------------------------------------------------------------------------------------------------------ estimated creatinine clearance is 58.4 ml/min (by C-G formula based on Cr of 0.87). ------------------------------------------------------------------------------------------------------------------  Coagulation profile  Lab 09/02/11 0758 09/01/11 0807 08/31/11 0615 08/30/11 0520 08/29/11 0618  INR 2.30* 2.62* 2.56* 2.26* 2.64*  PROTIME -- -- -- -- --    No results found for this basename: DDIMER:2 in the last 72 hours  Cardiac Enzymes  Lab 08/28/11 0700 08/28/11 0034 08/27/11 1400  CKMB 1.9 1.7 1.8  TROPONINI <0.30 <0.30 <0.30  MYOGLOBIN -- -- --   ------------------------------------------------------------------------------------------------------------------  Lab 08/27/11 1353  POCBNP 1611.0*    Micro Results No results found for this  or any previous visit (from the past 240 hour(s)).  Radiology Reports Dg Chest 2 View  08/29/2011  *RADIOLOGY REPORT*  Clinical Data: Infiltrate, follow-up  CHEST - 2 VIEW  Comparison: 08/27/2011  Findings: Enlargement of cardiac silhouette. Tortuous aorta. Pulmonary vascularity normal. Minimal bronchitic changes. Improved basilar aeration bilaterally. No definite acute infiltrate, pleural effusion or pneumothorax. Lungs minimally hyperexpanded. Bones demineralized.  IMPRESSION: Enlargement of cardiac silhouette. Improved basilar aeration.  Original Report Authenticated By: Lollie Marrow, M.D.   Dg Chest 2 View  08/27/2011  *RADIOLOGY REPORT*  Clinical Data: Pulmonary vascular congestion.  Shortness of breath.  CHEST - 2 VIEW   Comparison: 11/29 and 08/20/2011  Findings: The lungs are clear except for  minimal residual atelectasis at the bases.  No effusions.  Heart size and vascularity are normal.  No acute osseous abnormality.  IMPRESSION: Minimal residual atelectasis at the lung bases.  Normal vascularity.  Original Report Authenticated By: Gwynn Burly, M.D.   Dg Chest 2 View  08/20/2011  *RADIOLOGY REPORT*  Clinical Data: Fever and unsteady gait.  CHEST - 2 VIEW  Comparison: 03/09/2011  Findings: Two views of the chest demonstrate clear lungs. Heart and mediastinum are within normal limits.  Trachea is midline.  No evidence for airspace disease or effusions.  IMPRESSION: No acute chest findings.  Original Report Authenticated By: Richarda Overlie, M.D.   Ct Head Wo Contrast  08/27/2011  *RADIOLOGY REPORT*  Clinical Data: Confusion, weakness; altered level of consciousness.  CT HEAD WITHOUT CONTRAST  Technique:  Contiguous axial images were obtained from the base of the skull through the vertex without contrast.  Comparison: CT of the head performed 08/20/2011  Findings: There is no evidence of acute infarction, mass lesion, or intra- or extra-axial hemorrhage on CT.  There is a region of chronic encephalomalacia at the left frontal lobe, with marked ex vacuo dilatation of the frontal horn of the left lateral ventricle, underlying the patient's left frontal craniotomy flap.  Diffuse periventricular and subcortical white matter change likely reflects small vessel ischemic microangiopathy.  Small chronic lacunar infarcts are noted within the basal ganglia.  The posterior fossa, including the cerebellum, brainstem and fourth ventricle, is within normal limits.  No mass effect or midline shift is seen.  There is no evidence of fracture; visualized osseous structures are otherwise unremarkable in appearance.  The visualized portions of the orbits are within normal limits.  The paranasal sinuses and mastoid air cells are well-aerated.   No significant soft tissue abnormalities are seen.  IMPRESSION:  1.  No acute intracranial pathology seen on CT. 2.  Postoperative changes at the left frontal lobe. 3.  Diffuse small vessel ischemic microangiopathy and small chronic lacunar infarcts within the basal ganglia.  Original Report Authenticated By: Tonia Ghent, M.D.   Ct Head Wo Contrast  08/20/2011  *RADIOLOGY REPORT*  Clinical Data: Altered mental status.  Several falls yesterday. Unsteady gait today.  Recent urinary tract infection.  History of strokes, seizures, hypertension.  CT HEAD WITHOUT CONTRAST  Technique:  Contiguous axial images were obtained from the base of the skull through the vertex without contrast.  Comparison: 03/08/2011  Findings: There is central cortical atrophy.  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  The patient has had a frontal craniotomy.  Significant periventricular white matter changes appears stable.  No new intra or extra-axial fluid collections or masses.  No intracranial hemorrhage.  Visualized paranasal and mastoid air cells are well-aerated.  IMPRESSION:  1.  Postoperative  changes in the left frontal lobe. 2.  Significant atrophy and small vessel disease. 3. No evidence for acute intracranial abnormality.  Original Report Authenticated By: Patterson Hammersmith, M.D.   Mr Brain Wo Contrast  08/28/2011  *RADIOLOGY REPORT*  Clinical Data: Evaluate for acute stroke or bleed. Increased confusion.  History of atrial fibrillation.  Supratherapeutic INR.  MRI HEAD WITHOUT CONTRAST  Technique:  Multiplanar, multiecho pulse sequences of the brain and surrounding structures were obtained according to standard protocol without intravenous contrast.  Comparison: Most recent CT 08/27/2011.  There is a recent MR 03/08/2011.  Findings: There is no evidence for acute infarction, intracranial hemorrhage, mass lesion, hydrocephalus, or extra-axial fluid. Moderate to severe atrophy is present.  Advanced chronic  microvascular ischemic change is present in the periventricular and subcortical white matter.  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  Extensive gliosis surrounds the porencephalic cyst.  No significant midline shift.  Right carotid and basilar artery both demonstrate patency based on flow void.  No flow void in the left internal carotid artery is consistent with chronic occlusion.  There appears to be some reconstitution of the supraclinoid ICA on the left due to collateral flow.  Normal orbits.  No acute sinus disease.  Minimal mastoid fluid is nonspecific.  IMPRESSION: Atrophy and small vessel disease.  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  No acute intracranial findings.  Chronic occlusion left internal carotid artery.  Original Report Authenticated By: Elsie Stain, M.D.   Nm Gastric Emptying  08/17/2011  *RADIOLOGY REPORT*  Clinical Data:  Abdominal pain  NUCLEAR MEDICINE GASTRIC EMPTYING SCAN  Technique:  After oral ingestion of radiolabeled meal, sequential abdominal images were obtained for 120 minutes.  Residual percentage of activity remaining within the stomach was calculated at 60 and 120 minutes.  Radiopharmaceutical: 2.0 mCi Tc-13m sulfur colloid.  Comparison:  None  Findings: After 120 minutes there is 23% radiotracer activity remaining in the stomach.  (Normal value is less than 30% remaining at 2 hours)  IMPRESSION:  1.  Normal gastric emptying.  Original Report Authenticated By: Rosealee Albee, M.D.   Ct Abdomen Pelvis W Contrast  08/21/2011  *RADIOLOGY REPORT*  Clinical Data: Fever.  Diarrhea.  Abdominal pain.  CT ABDOMEN AND PELVIS WITH CONTRAST  Technique:  Multidetector CT imaging of the abdomen and pelvis was performed following the standard protocol during bolus administration of intravenous contrast.  Contrast: OMNIPAQUE IOHEXOL 300 MG/ML IV SOLN  Comparison: None.   Findings: Images through the lung bases show mild right lower lobe infiltrate, suspicious for pneumonia.  A tiny less than 1 cm right hepatic lobe cyst is noted but no liver masses are identified.  Gallbladder is unremarkable.  The pancreas, spleen, and adrenal glands are normal in appearance.  Tiny renal cysts are noted bilaterally but there is no evidence of renal mass or hydronephrosis.  A Foley catheter is seen within the bladder which is collapsed.  A subserosal fibroid is seen in the right lower uterine body which measures 4 cm.  Shotty bilateral iliac lymph nodes are seen within the pelvis, none of which are pathologically enlarged.  Wall thickening is seen involving the inferior rectum and anus, and there is soft tissue stranding seen within the presacral fat and the ischiorectal fossae bilaterally.  This is suspicious for proctocolitis.  No abscess identified. Sigmoid diverticulosis is noted, however there is no evidence of diverticulitis.  IMPRESSION:  1.  Findings consistent with proctocolitis.  No evidence of abscess. 2.  4 cm uterine fibroid. 3.  Sigmoid  diverticulosis.  No radiographic evidence of diverticulitis. 4.  Mild asymmetric lower lobe infiltrate; pneumonia cannot be excluded.  Recommend clinical correlation and follow-up by chest radiograph.  Original Report Authenticated By: Danae Orleans, M.D.   Dg Esophagus  08/28/2011  *RADIOLOGY REPORT*  Clinical Data: Dysphagia.  ESOPHOGRAM/BARIUM SWALLOW  Technique:  Single contrast examination was performed using Omnipaque-300.  Fluoroscopy time:  1.44 minutes.  Comparison:  None  Findings:  Nonspecific esophageal motility disorder with occasional disruption of the primary peristaltic wave and occasional tertiary contractions.  No findings for diffuse esophageal spasm.  There is a widely patent lower esophageal mucosal ring and a very small sliding-type hiatal hernia.  No gastroesophageal reflux was demonstrated.  No intrinsic or extrinsic lesions  of the esophagus were identified and no mucosal abnormalities were seen.  IMPRESSION:  1.  Nonspecific esophageal motility disorder. 2.  Widely patent lower esophageal mucosal ring. 3.  Very small sliding-type hiatal hernia. 4.  No gastroesophageal reflux was demonstrated.  Original Report Authenticated By: P. Loralie Champagne, M.D.   Dg Chest Port 1 View  08/23/2011  *RADIOLOGY REPORT*  Clinical Data: Evaluate pulmonary edema, shortness of breath  PORTABLE CHEST - 1 VIEW  Comparison: 08/22/2011; 08/20/2011  Findings: Grossly unchanged borderline enlarged cardiac silhouette and mediastinal contours.  Improved aeration of the right upper and mid lung with persistent ill-defined heterogeneous opacities in the right lower lung.  There is persistent blunting right costophrenic angle which may suggest a small residual right sided effusion. Grossly unchanged left basilar heterogeneous opacities.  Unchanged bones.  IMPRESSION:  1.  Improved aeration of the right upper and mid lung which given rapid interval change is favored to represent improving pulmonary edema. 2.  Persistent opacities within the right lung base may represent residual asymmetric pulmonary edema, though underlying infection is not excluded. Continued attention on follow-up is recommended.  Original Report Authenticated By: Waynard Reeds, M.D.   Dg Chest Port 1 View  08/22/2011  *RADIOLOGY REPORT*  Clinical Data: Short of breath and wheezing.  PORTABLE CHEST - 1 VIEW  Comparison: 08/20/2011  Findings: There is marked interval change in appearance of the chest with extensive airspace infiltrates now present in the right upper and lower lung zones.  Findings are consistent with pneumonia and may be on the basis of aspiration.  The left lung is reasonably clear with some mild atelectasis in the left lower lobe.  No pleural effusions identified.  IMPRESSION: Significant change in appearance of the chest with extensive pneumonia now present throughout  the right lung.  This could be on the basis of aspiration.  Original Report Authenticated By: Reola Calkins, M.D.   Dg Swallowing Func-no Report  08/24/2011  CLINICAL DATA: objectively assess swallow function   FLUOROSCOPY FOR SWALLOWING FUNCTION STUDY:  Fluoroscopy was provided for swallowing function study, which was  administered by a speech pathologist.  Final results and recommendations  from this study are contained within the speech pathology report.      Scheduled Meds:   . sodium chloride   Intravenous Once  . amLODipine  10 mg Oral Daily  . carbamazepine  200 mg Oral BID  . estradiol  2 g Vaginal 3 times weekly  . feeding supplement  1 Container Oral BID BM  . Flora-Q  1 capsule Oral Daily  . fluconazole  100 mg  Oral Daily  . insulin aspart  0-9 Units Subcutaneous Q4H  . lamoTRIgine  25 mg Oral BID  . lisinopril  40 mg Oral Daily  . magic mouthwash  15 mL Oral TID  . metoprolol tartrate  100 mg Oral BID  . metroNIDAZOLE  500 mg Oral Q12H  . pantoprazole  40 mg Oral QAC breakfast  . sodium phosphate  1 enema Rectal Once  . sodium phosphate  1 enema Rectal Once  . warfarin  0.5 mg Oral ONCE-1800  . DISCONTD: metroNIDAZOLE  500 mg Oral Q8H   Continuous Infusions:  PRN Meds:.acetaminophen, barrier cream, food thickener, hydrALAZINE, levalbuterol, loperamide, ondansetron (ZOFRAN) IV, DISCONTD: fentaNYL, DISCONTD: midazolam  Assessment & Plan   Principal Problem:  *PNA (pneumonia) Active Problems:  HYPERTENSION  FIBRILLATION, ATRIAL  ESOPHAGEAL STRICTURE  GERD (gastroesophageal reflux disease)  Diarrhea of infectious origin  Hypokalemia  Hypomagnesemia  Hyperglycemia  Acute respiratory failure  Diastolic CHF, acute  Lethargic  C. difficile colitis    PNA (pneumonia) (08/22/2011) -Comm Acquired VS Asp. Patient received a 5 days of ceftriaxone and Azithromycin. Patient on dysphagia 2 diet for aspiration precaution. CXR stable, clinically resolved. Asp  precautions.  HYPERTENSION (12/29/2007) Continue with Norvasc , lisinopril and metoprolol.   Filed Vitals:   09/02/11 0915 09/02/11 0920 09/02/11 0930 09/02/11 0940  BP: 132/89 132/89 137/79 152/67  Pulse:      Temp:      TempSrc:      Resp:  93 23 10  Height:      Weight:      SpO2:  98% 98% 94%    FIBRILLATION, ATRIAL (12/29/2007)  Heart rate better controlled. , metoprolol was increased to 100 mg BID on 12-06. will follow INR. Coumadin restarted. Pharmacy monitoring Coumadin dose closely.  Lab Results  Component Value Date   INR 2.30* 09/02/2011   INR 2.62* 09/01/2011   INR 2.56* 08/31/2011     GERD (gastroesophageal reflux disease) (06/12/2011), Nausea: Patient on Protonix,  Nausea much improved. GI following, H/O Esp.Stricture.  C. difficile colitis  Post Flex Sig today, D/W Dr Rhea Belton, outpt GI follow for polyp, clinically C Diff resolved, add Imodium per him and taper off flagyl.   Hypokalemia (08/20/2011) -  Hypomagnesemia (08/22/2011)   Replaced, check in am.   Acute respiratory failure (08/22/2011)  Secondary to pneumonia & CHF - improved.   Diastolic CHF, acute (08/23/2011) appears euvolemic.  I will hold on lasix, Patient negative - balance.   Lethargic (08/27/2011)/Encephalopathy: ? Mild Delirium  ? Baseline, much better.  MRI negative for acute stroke . EEG pending . Neuro onj board.  Dysphagia:  Esophagogram showed motility disorder. No endoscopy at this time per GI. Speech following - Dys 2 diet.  Oral Candidiasis/ gluteal, perineal candidiasis:  Continue with fluconazole, started December 4. Add Nystatin powder topically to rectum.  Leukocytosis: probably secondary to C diff. WBC decreasing.   Daughter phone called 09-02-11 @11 .45 am message left.  Still with foley in place due to risk of worsening skin infection,. Patient weak. Will try to DC soon.   LOS: 12 days     DVT Prophylaxis  Coumadin   See all Orders from today for further  details     Leroy Sea M.D 09/02/2011, 11:45 AM  [facility] Office  [phone]

## 2011-09-02 NOTE — H&P (View-Only) (Signed)
[facility] Progress Note  Subjective: Recalled to see patient regarding diarrhea/loose stools.  Pt reports ongoing diarrhea, 4-5 loose stools/day.  RN states stools are watery with no form.  Nonbloody.  No melena.  Pt reports mild lower abd pain/cramping.  Some nausea assoc with flagyl admin. No fevers.    Objective:  Vital signs in last 24 hours: Temp:  [97.4 F (36.3 C)-99 F (37.2 C)] 98.9 F (37.2 C) (12/08 1225) Pulse Rate:  [84-108] 87  (12/08 1225) Resp:  [19-22] 21  (12/08 1225) BP: (142-156)/(76-96) 142/76 mmHg (12/08 1225) SpO2:  [92 %-98 %] 95 % (12/08 1225) Last BM Date: 09/01/11 General:   Alert,  Chronically ill appearing, NAD Heart:  Regular rate and rhythm; no murmurs Abdomen:  Soft, mildly distended, nontender. Normal bowel sounds, without guarding, and without rebound.   Extremities:  Without edema. Neurologic:  Alert, stable aphasia Psych:  Alert and cooperative. Normal mood and affect.  Intake/Output from previous day: 12/07 0701 - 12/08 0700 In: 930 [P.O.:930] Out: 1056 [Urine:1050; Stool:6] Intake/Output this shift: Total I/O In: 240 [P.O.:240] Out: 2 [Stool:2]  Lab Results:  [facility] 09/01/11 0807 08/31/11 0615 08/30/11 0520  WBC 12.0* 13.4* 16.8*  HGB 12.8 13.5 13.1  HCT 38.9 40.8 39.4  PLT 365 380 389   BMET  Basename 09/01/11 0807 08/31/11 0615 08/30/11 0520  NA 136 140 141  K 3.5 3.7 3.8  CL 101 106 107  CO2 27 28 26   GLUCOSE 96 107* 103*  BUN 17 16 20   CREATININE 0.87 0.80 0.78  CALCIUM 8.8 9.1 9.3   PT/INR  Basename 09/01/11 0807 08/31/11 0615  LABPROT 28.4* 27.9*  INR 2.62* 2.56*    Assessment / Plan: 75 yo with multiple med problems, admitted with c diff colitis (seen earlier in this hospital stay for dysphagia symptoms) now with ongoing diarrhea/loose stools.  1. C diff - day 12 of 14 on IV flagyl.  Some improvement compared to admission, but stools remain nonformed and very loose.  Some fecal seepage.  WBC  still elevated, though only slightly.  Question is, has the c diff resolved on flagyl and/or is there another process ongoing. --Will schedule flex sig with sedation for tomorrow.  This will help answer the question of ongoing infection/inflammation. --Hold antidiarrheals for now, until after flex sig --On warfarin, will not be able to perform polypectomy should that be necessary --NPO after MN, 1 enema for prep. --discussed with daughter, Selena Batten, by phone and explained risks/benefits.    LOS: 12 days   PYRTLE, JAY M  09/01/2011, 3:38 PM

## 2011-09-02 NOTE — Progress Notes (Signed)
ANTICOAGULATION CONSULT NOTE - Follow Up Consult  Pharmacy Consult for coumadin Indication: atrial fibrillation  Allergies  Allergen Reactions  . Ciprofloxacin     REACTION: diarrhea/yeast infection  . Penicillins Hives    Vital Signs: Temp: 98.6 F (37 C) (12/09 1252) Temp src: Axillary (12/09 1252) BP: 132/73 mmHg (12/09 1252) Pulse Rate: 76  (12/09 1252)  Labs:  Basename 09/02/11 0758 09/01/11 0807 08/31/11 0615  HGB -- 12.8 13.5  HCT -- 38.9 40.8  PLT -- 365 380  APTT -- -- --  LABPROT 25.7* 28.4* 27.9*  INR 2.30* 2.62* 2.56*  HEPARINUNFRC -- -- --  CREATININE -- 0.87 0.80  CKTOTAL -- -- --  CKMB -- -- --  TROPONINI -- -- --   Estimated Creatinine Clearance: 58.4 ml/min (by C-G formula based on Cr of 0.87).   Assessment: 75 yo on coumadin for afib. INR down a bit today. Dose of coumadin was not charted last PM so I'm not sure if it was given or not. She is also on flagyl so this will make the INR more sensitive.  Pt cont to have diarrhea which will also make her sensitive to coumadin. Dr. Thedore Mins wants to stop flagyl after today.   Goal of Therapy:  INR 2-3   Plan:  1. Coumadin 1mg  PO x1  [name] 09/02/2011,1:46 PM

## 2011-09-02 NOTE — Interval H&P Note (Signed)
History and Physical Interval Note:  09/02/2011 8:44 AM  [name]  has presented today for surgery, with the diagnosis of diarrhea.  The various methods of treatment have been discussed with the patient and family. After consideration of risks, benefits and other options for treatment, the patient has consented to  Procedure(s), also consent was obtained from her daughter, [name]: FLEXIBLE SIGMOIDOSCOPY as a surgical intervention .  The patients' history has been reviewed, patient examined, no change in status, stable for surgery.  I have reviewed the patients' chart and labs.  Questions were answered to the patient's satisfaction.     Cara Aguino M

## 2011-09-02 NOTE — Op Note (Signed)
[name] [address] [place], [place]  [address]  COLONOSCOPY PROCEDURE REPORT  PATIENT:  [name], [name]  MR#:  [id number] BIRTHDATE:  1935-09-28, 75 yrs. old  GENDER:  female ENDOSCOPIST:  Carie Caddy. Konstantine Gervasi, MD REF. BY:  [facility], PROCEDURE DATE:  09/02/2011 PROCEDURE:  Diagnostic Colonoscopy ASA CLASS:  Class III INDICATIONS:  unexplained diarrhea, recent c. difficle colitis nearing completion of antibiotic therapy MEDICATIONS:   Fentanyl 50 mcg IV, Versed 4 mg IV  DESCRIPTION OF PROCEDURE:   After the risks benefits and alternatives of the procedure were thoroughly explained, informed consent was obtained.  Digital rectal exam was performed and revealed decreased sphincter tone and severe perianal dermatitis. The EC-[id number] ([id number]) endoscope was introduced through the anus and advanced to the cecum, limited by semi-solid stool in the right colon/cecum (prepped only with fleets enema).    The quality of the prep was adequate.  The instrument was then slowly withdrawn as the colon was fully examined. <<PROCEDUREIMAGES>> FINDINGS:  A  4 mm sessile polyp was found in the descending colon.  Polypectomy was not attempted due to the patient's anticoagulation status.  All polyp tissue left intact. This was otherwise a normal examination of the colon.  The colonic mucosa appeared normal with no evidence of pseudomembranes,inflammation, or vascular lesions.  Retroflexion was not performed due to resistance and small rectal vault.  The scope was then withdrawn in  minutes from the cecum and the procedure completed.  COMPLICATIONS:  None.  ENDOSCOPIC IMPRESSION: 1) Sessile polyp in the descending colon.  Polypectomy not attempted due to anticoagulation status. 2) Otherwise normal examination with no endoscopic sign of unresolved infection.  RECOMMENDATIONS: 1) Complete 14 days of metronidazole therapy.  No evidence for unresolved c. difficle  colitis. 2) Begin antidiarrheals, loperamide or lomotil and titrate as necessary to control loose stools. 3) Continue topical and oral therapy with local skin care to severe perineal dermatitis (likely fungal). 4) Discussion with patient and primary care MD regarding repeat colonoscopy for polypectomy at a time when anticoagulation can be held (or bridged to procedure with enoxaparin).  Carie Caddy. Rhea Belton, MD  n. Rosalie DoctorCarie Caddy. Yecheskel Kurek at 09/02/2011 09:36 AM  Lauretta Chester, [phone]

## 2011-09-03 ENCOUNTER — Encounter (HOSPITAL_COMMUNITY): Payer: Self-pay | Admitting: Internal Medicine

## 2011-09-03 DIAGNOSIS — A0472 Enterocolitis due to Clostridium difficile, not specified as recurrent: Secondary | ICD-10-CM

## 2011-09-03 DIAGNOSIS — R197 Diarrhea, unspecified: Secondary | ICD-10-CM

## 2011-09-03 LAB — CBC
MCH: 32.1 pg (ref 26.0–34.0)
MCHC: 34.1 g/dL (ref 30.0–36.0)
Platelets: 344 10*3/uL (ref 150–400)
RBC: 4.18 MIL/uL (ref 3.87–5.11)
RDW: 13.9 % (ref 11.5–15.5)

## 2011-09-03 LAB — GLUCOSE, CAPILLARY: Glucose-Capillary: 122 mg/dL — ABNORMAL HIGH (ref 70–99)

## 2011-09-03 LAB — MAGNESIUM: Magnesium: 1.6 mg/dL (ref 1.5–2.5)

## 2011-09-03 LAB — BASIC METABOLIC PANEL
Calcium: 9.1 mg/dL (ref 8.4–10.5)
GFR calc Af Amer: >90 mL/min (ref >90)
GFR calc non Af Amer: 83 mL/min — ABNORMAL LOW (ref >90)
Sodium: 137 mEq/L (ref 135–145)

## 2011-09-03 LAB — PROTIME-INR: Prothrombin Time: 23.3 seconds — ABNORMAL HIGH (ref 11.6–15.2)

## 2011-09-03 MED ORDER — PANTOPRAZOLE SODIUM 40 MG PO TBEC
40.0000 mg | DELAYED_RELEASE_TABLET | Freq: Every day | ORAL | Status: DC
  Administered 2011-09-03 – 2011-09-04 (×2): 40 mg via ORAL
  Filled 2011-09-03: qty 1

## 2011-09-03 MED ORDER — MAGNESIUM SULFATE 50 % IJ SOLN: 1.0000 g | Freq: Once | INTRAMUSCULAR | Status: DC

## 2011-09-03 MED ORDER — MAGNESIUM SULFATE IN D5W 10-5 MG/ML-% IV SOLN
1.0000 g | Freq: Once | INTRAVENOUS | Status: AC
  Administered 2011-09-03: 15:00:00 1 g via INTRAVENOUS
  Filled 2011-09-03: qty 100

## 2011-09-03 MED ORDER — POTASSIUM CHLORIDE 20 MEQ/15ML (10%) PO LIQD
40.0000 meq | Freq: Every day | ORAL | Status: DC
  Administered 2011-09-03 – 2011-09-04 (×2): 40 meq via ORAL
  Filled 2011-09-03 (×2): qty 30

## 2011-09-03 MED ORDER — ENSURE PUDDING PO PUDG
1.0000 | Freq: Two times a day (BID) | ORAL | Status: DC | PRN
  Filled 2011-09-03: qty 1

## 2011-09-03 NOTE — Progress Notes (Signed)
Seen and agree with SPT Jaylene Schrom [place], PT [phone]  

## 2011-09-03 NOTE — Progress Notes (Signed)
Physical Therapy Treatment Patient Details Name: [name] MRN: [id number] DOB: 11/25/1935 Today's Date: 09/03/2011  PT Assessment/Plan  PT - Assessment/Plan Comments on Treatment Session: Pt. did well today and was able progress to transferring bed to chair with total assist.  HR at 77 at beginning of session increasing to no higher than 115 throughout session.  Pt. expressed frustration at her decreased mobility and was reassured that she would continue to make progress as she heals and works on her mobility.   PT Plan: Discharge plan remains appropriate Follow Up Recommendations: Skilled nursing facility Equipment Recommended: Defer to next venue PT Goals  Acute Rehab PT Goals PT Goal: Supine/Side to Sit - Progress: Progressing toward goal PT Goal: Sit at [facility] Of Bed - Progress: Progressing toward goal PT Goal: Sit to Supine/Side - Progress: Other (comment) (Not addressed today. ) PT Goal: Sit to Stand - Progress: Progressing toward goal PT Goal: Stand to Sit - Progress: Progressing toward goal Pt will Transfer Bed to Chair/Chair to Bed: with mod assist PT Transfer Goal: Bed to Chair/Chair to Bed - Progress: Revised (modified due to lack of progress/goal met) PT Goal: Ambulate - Progress:  (Not addressed today. ) PT Goal: Up/Down Stairs - Progress:  (Not addressed today.)  PT Treatment Precautions/Restrictions  Precautions Precautions: Fall;Other (comment) (contact c-diff) Required Braces or Orthoses: No Restrictions Weight Bearing Restrictions: No Mobility (including Balance) Bed Mobility Bed Mobility: Yes Supine to Sit: 3: Mod assist;HOB elevated (Comment degrees) (Hand-held assist to pull to sitting, HOB 20 degrees) Supine to Sit Details (indicate cue type and reason): Cues to bring legs to EOB and assist to upper extremities to sit up.  Sitting - Scoot to Edge of Bed: 4: Min assist Sitting - Scoot to [place] of Bed Details (indicate cue type and reason): Pt. requring  increased time with verbal cueing and min assist to scoot to EOB.  Transfers Transfers: Yes Sit to Stand: 1: +2 Total assist;Patient percentage (comment);From bed;With upper extremity assist (Pt. = 40%) Sit to Stand Details (indicate cue type and reason): Verbal cues for initiation, assist for anterior translation.  Pt. with flexed trunk in standing.  Stand to Sit: 1: +2 Total assist;With upper extremity assist;To chair/3-in-1 (Pt. = 40%) Stand to Sit Details: Pt. sits prematurely. Two trials in order to scoot back into chair, pt. 60% on second trial.  Stand Pivot Transfers: 1: +2 Total assist;Patient percentage (comment) (Pt. = 40%) Stand Pivot Transfer Details (indicate cue type and reason): Verbal and tactile cues for initiation of stepping.  Ambulation/Gait Ambulation/Gait: No    Exercise  General Exercises - Lower Extremity Long Arc Quad: AROM;Both;5 reps;Seated End of Session PT - End of Session Activity Tolerance: Patient limited by fatigue Patient left: in chair;with call bell in reach Nurse Communication: Mobility status for transfers General Behavior During Session: [facility] for tasks performed Cognition: Impaired, at baseline Cognitive Impairment: Pt. with history of aphasia.   Laney Pastor, SPT  09/03/2011, 3:37 PM

## 2011-09-03 NOTE — Progress Notes (Signed)
Reviewed and agree with management. Brady Plant D. Delisha Peaden, M.D., FACG  

## 2011-09-03 NOTE — Progress Notes (Signed)
Nutrition Follow-up  Diet Order:  Dys 2 thin liquids  Ensure Pudding bid  Meds: Scheduled Meds:   . amLODipine  10 mg Oral Daily  . carbamazepine  200 mg Oral BID  . estradiol  2 g Vaginal 3 times weekly  . feeding supplement  1 Container Oral BID BM  . Flora-Q  1 capsule Oral Daily  . fluconazole  100 mg Oral Daily  . insulin aspart  0-9 Units Subcutaneous TID WC  . lamoTRIgine  25 mg Oral BID  . lisinopril  40 mg Oral Daily  . magic mouthwash  15 mL Oral TID  . magnesium sulfate IVPB  1 g Intravenous Once  . metoprolol tartrate  100 mg Oral BID  . metroNIDAZOLE  500 mg Oral Q8H  . nystatin   Topical TID  . pantoprazole  40 mg Oral QAC breakfast  . potassium chloride  40 mEq Oral Daily  . sodium phosphate  1 enema Rectal Once  . warfarin  1 mg Oral ONCE-1800  . DISCONTD: insulin aspart  0-9 Units Subcutaneous Q4H  . DISCONTD: magnesium sulfate  1 g Intravenous Once  . DISCONTD: pantoprazole  40 mg Oral QAC breakfast   Continuous Infusions:  PRN Meds:.acetaminophen, barrier cream, food thickener, hydrALAZINE, levalbuterol, loperamide, ondansetron (ZOFRAN) IV  Labs:  CMP     Component Value Date/Time   NA 137 09/03/2011 0808   K 3.4* 09/03/2011 0808   CL 103 09/03/2011 0808   CO2 27 09/03/2011 0808   GLUCOSE 91 09/03/2011 0808   BUN 14 09/03/2011 0808   CREATININE 0.72 09/03/2011 0808   CALCIUM 9.1 09/03/2011 0808   PROT 6.2 08/21/2011 0520   ALBUMIN 2.7* 08/21/2011 0520   AST 16 08/21/2011 0520   ALT 12 08/21/2011 0520   ALKPHOS 66 08/21/2011 0520   BILITOT 0.7 08/21/2011 0520   GFRNONAA 83* 09/03/2011 0808   GFRAA >90 09/03/2011 0808     Intake/Output Summary (Last 24 hours) at 09/03/11 1632 Last data filed at 09/03/11 1300  Gross per 24 hour  Intake    360 ml  Output   2251 ml  Net  -1891 ml    Weight Status:  71.5kg (12/7)- decreased from 75.6kg 1 week ago Discussed with nurse.  Intake remains poor.  Ginette Pitman has negatively impacted intake but improving  slightly with cold foods  Nutrition Dx:  Inadequate oral intake-ongoing  Goal:  Intake >75% meals and supplements not met  Intervention:  1.  Encouraged po 2.  Pt dislikes Ensure pudding,  will change to prn 3.  Magic cup with meals  Monitor:  PO intake, weights, labs   Jeoffrey Massed Pager #:  [phone]

## 2011-09-03 NOTE — Progress Notes (Signed)
09/03/2011 [facility] Progress Note   Note plans for discharge to SNF today.   Stool today, once loose, medium volume, yellow per nursing entry.  Has not yet received any of her PRN immodium. Eating 50 to 60% of meals. Nearly finished course of Metronidazole, IV initially 11/28 - 12/7, now on PO. Also on Nystatin powder to perineum. On Flora-Q, oral antifungal:  Magic Mouthwash an Diflucan  Received Diflucan IV beginning 12/4, oral since 12/7.  Exam Temp 98.0 BP      153/93 Resp   18 Pulse   85  Did not reaxamine patient.  Labs   WBC:  11.1. Hgb:     13.4.  Potassium:  3.4  Assessment:   1.  C Diff colitis, currently day 12-13 Metronidazole, so can finish course after last dose 09/05/11. Colonoscopy yesterday showed no colitis or pseudomembranes. 2.  Descending colon polyp.  If pt fit enough:  Will need follow up colonoscopy with polypectomy when pt can come off coumadin.  GI office will be notified to make future appointment in a couple of months.  GI primary MD is Robt. Arlyce Dice. 3.  A Fib, chronic coumadin.  INR is 2.6 today. 4.  Dysphagia.  On Dysphagia 2, chopped, diet. 5.  Nausea.  Had been improved, but some nausea now which is felt due to Metronidazole.  Should stay on Protonix.  However if she has recurrent C Diff, may need to consider alternative med such as Ranitindine or Famotidine. 6.  Seizure d/o.  Keppra d/cd in past because it was causing nausea (had dose related response to this med).  She is now on Tegretol and Lamictal.  7.  Minor hypokalemia:  Orders for oral potassium noted. 8.  Perineal candida, on PO Diflucan  Plan: 1.  Dr Marzetta Board nurse has been notified of need for pt to have follow up office visit in a few months.  At that time, decision can be made wether or not to pursue polypectomy off Coumadin. 2.  Continue the chopped diet, oral Diflucan, Protonix, finish oral Metronidazole after 12/12 last dose.   Jennye Moccasin PA-C [phone].

## 2011-09-03 NOTE — Progress Notes (Addendum)
[name] ZOX:[id number] is a 75 y.o. female,  Outpatient Primary MD for the patient is MCNEILL,WENDY, MD, MD  Chief Complaint  Patient presents with  . Altered Mental Status        Subjective:   Melyssa Signor today has, No headache, No chest pain, No abdominal pain - No Nausea, No new weakness tingling or numbness, No Cough - SOB.    Objective:   Filed Vitals:   09/03/11 0930 09/03/11 1000 09/03/11 1030 09/03/11 1300  BP:    122/71  Pulse:    80  Temp:    99 F (37.2 C)  TempSrc:      Resp: 21 16 18    Height:      Weight:      SpO2:    95%    Wt Readings from Last 3 Encounters:  08/31/11 71.5 kg (157 lb 10.1 oz)  08/31/11 71.5 kg (157 lb 10.1 oz)  08/01/11 74.844 kg (165 lb)     Intake/Output Summary (Last 24 hours) at 09/03/11 1340 Last data filed at 09/03/11 1300  Gross per 24 hour  Intake    360 ml  Output   2251 ml  Net  -1891 ml    Exam Awake Alert, Oriented X 2, No new F.N deficits, Normal affect, ? Mild delirium [place].AT,PERRAL Supple Neck,No JVD, No cervical lymphadenopathy appriciated.  Symmetrical Chest wall movement, Good air movement bilaterally, CTAB RRR,No Gallops,Rubs or new Murmurs, No Parasternal Heave +ve B.Sounds, Abd Soft, Non tender, No organomegaly appriciated, No rebound -guarding or rigidity. No Cyanosis, Clubbing or edema, No new Rash or bruise     Data Review  CBC  Lab 09/03/11 0808 09/01/11 0807 08/31/11 0615 08/30/11 0520 08/29/11 0618  WBC 11.1* 12.0* 13.4* 16.8* 16.1*  HGB 13.4 12.8 13.5 13.1 13.9  HCT 39.3 38.9 40.8 39.4 42.0  PLT 344 365 380 389 420*  MCV 94.0 96.0 96.7 96.6 96.3  MCH 32.1 31.6 32.0 32.1 31.9  MCHC 34.1 32.9 33.1 33.2 33.1  RDW 13.9 14.1 14.1 14.1 13.8  LYMPHSABS -- -- -- -- --  MONOABS -- -- -- -- --  EOSABS -- -- -- -- --  BASOSABS -- -- -- -- --  BANDABS -- -- -- -- --    Chemistries   Lab 09/03/11 0808 09/01/11 0807 08/31/11 0615 08/30/11 0520 08/29/11 0618  NA 137 136 140 141 140    K 3.4* 3.5 3.7 3.8 3.6  CL 103 101 106 107 105  CO2 27 27 28 26 25   GLUCOSE 91 96 107* 103* 102*  BUN 14 17 16 20 18   CREATININE 0.72 0.87 0.80 0.78 0.74  CALCIUM 9.1 8.8 9.1 9.3 9.0  MG 1.6 1.5 -- 1.7 --  AST -- -- -- -- --  ALT -- -- -- -- --  ALKPHOS -- -- -- -- --  BILITOT -- -- -- -- --   ------------------------------------------------------------------------------------------------------------------ estimated creatinine clearance is 63.5 ml/min (by C-G formula based on Cr of 0.72). ------------------------------------------------------------------------------------------------------------------  Coagulation profile  Lab 09/03/11 0808 09/02/11 0758 09/01/11 0807 08/31/11 0615 08/30/11 0520  INR 2.03* 2.30* 2.62* 2.56* 2.26*  PROTIME -- -- -- -- --    No results found for this basename: DDIMER:2 in the last 72 hours  Cardiac Enzymes  Lab 08/28/11 0700 08/28/11 0034 08/27/11 1400  CKMB 1.9 1.7 1.8  TROPONINI <0.30 <0.30 <0.30  MYOGLOBIN -- -- --   ------------------------------------------------------------------------------------------------------------------ No components found with this basename: POCBNP:3  Micro Results No results found  for this or any previous visit (from the past 240 hour(s)).  Radiology Reports Dg Chest 2 View  08/29/2011  *RADIOLOGY REPORT*  Clinical Data: Infiltrate, follow-up  CHEST - 2 VIEW  Comparison: 08/27/2011  Findings: Enlargement of cardiac silhouette. Tortuous aorta. Pulmonary vascularity normal. Minimal bronchitic changes. Improved basilar aeration bilaterally. No definite acute infiltrate, pleural effusion or pneumothorax. Lungs minimally hyperexpanded. Bones demineralized.  IMPRESSION: Enlargement of cardiac silhouette. Improved basilar aeration.  Original Report Authenticated By: Lollie Marrow, M.D.   Dg Chest 2 View  08/27/2011  *RADIOLOGY REPORT*  Clinical Data: Pulmonary vascular congestion.  Shortness of breath.  CHEST - 2  VIEW  Comparison: 11/29 and 08/20/2011  Findings: The lungs are clear except for  minimal residual atelectasis at the bases.  No effusions.  Heart size and vascularity are normal.  No acute osseous abnormality.  IMPRESSION: Minimal residual atelectasis at the lung bases.  Normal vascularity.  Original Report Authenticated By: Gwynn Burly, M.D.   Dg Chest 2 View  08/20/2011  *RADIOLOGY REPORT*  Clinical Data: Fever and unsteady gait.  CHEST - 2 VIEW  Comparison: 03/09/2011  Findings: Two views of the chest demonstrate clear lungs. Heart and mediastinum are within normal limits.  Trachea is midline.  No evidence for airspace disease or effusions.  IMPRESSION: No acute chest findings.  Original Report Authenticated By: Richarda Overlie, M.D.   Ct Head Wo Contrast  08/27/2011  *RADIOLOGY REPORT*  Clinical Data: Confusion, weakness; altered level of consciousness.  CT HEAD WITHOUT CONTRAST  Technique:  Contiguous axial images were obtained from the base of the skull through the vertex without contrast.  Comparison: CT of the head performed 08/20/2011  Findings: There is no evidence of acute infarction, mass lesion, or intra- or extra-axial hemorrhage on CT.  There is a region of chronic encephalomalacia at the left frontal lobe, with marked ex vacuo dilatation of the frontal horn of the left lateral ventricle, underlying the patient's left frontal craniotomy flap.  Diffuse periventricular and subcortical white matter change likely reflects small vessel ischemic microangiopathy.  Small chronic lacunar infarcts are noted within the basal ganglia.  The posterior fossa, including the cerebellum, brainstem and fourth ventricle, is within normal limits.  No mass effect or midline shift is seen.  There is no evidence of fracture; visualized osseous structures are otherwise unremarkable in appearance.  The visualized portions of the orbits are within normal limits.  The paranasal sinuses and mastoid air cells are  well-aerated.  No significant soft tissue abnormalities are seen.  IMPRESSION:  1.  No acute intracranial pathology seen on CT. 2.  Postoperative changes at the left frontal lobe. 3.  Diffuse small vessel ischemic microangiopathy and small chronic lacunar infarcts within the basal ganglia.  Original Report Authenticated By: Tonia Ghent, M.D.   Ct Head Wo Contrast  08/20/2011  *RADIOLOGY REPORT*  Clinical Data: Altered mental status.  Several falls yesterday. Unsteady gait today.  Recent urinary tract infection.  History of strokes, seizures, hypertension.  CT HEAD WITHOUT CONTRAST  Technique:  Contiguous axial images were obtained from the base of the skull through the vertex without contrast.  Comparison: 03/08/2011  Findings: There is central cortical atrophy.  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  The patient has had a frontal craniotomy.  Significant periventricular white matter changes appears stable.  No new intra or extra-axial fluid collections or masses.  No intracranial hemorrhage.  Visualized paranasal and mastoid air cells are well-aerated.  IMPRESSION:  1.  Postoperative changes in the left frontal lobe. 2.  Significant atrophy and small vessel disease. 3. No evidence for acute intracranial abnormality.  Original Report Authenticated By: Patterson Hammersmith, M.D.   Mr Brain Wo Contrast  08/28/2011  *RADIOLOGY REPORT*  Clinical Data: Evaluate for acute stroke or bleed. Increased confusion.  History of atrial fibrillation.  Supratherapeutic INR.  MRI HEAD WITHOUT CONTRAST  Technique:  Multiplanar, multiecho pulse sequences of the brain and surrounding structures were obtained according to standard protocol without intravenous contrast.  Comparison: Most recent CT 08/27/2011.  There is a recent MR 03/08/2011.  Findings: There is no evidence for acute infarction, intracranial hemorrhage, mass lesion, hydrocephalus, or extra-axial fluid. Moderate to severe atrophy is present.   Advanced chronic microvascular ischemic change is present in the periventricular and subcortical white matter.  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  Extensive gliosis surrounds the porencephalic cyst.  No significant midline shift.  Right carotid and basilar artery both demonstrate patency based on flow void.  No flow void in the left internal carotid artery is consistent with chronic occlusion.  There appears to be some reconstitution of the supraclinoid ICA on the left due to collateral flow.  Normal orbits.  No acute sinus disease.  Minimal mastoid fluid is nonspecific.  IMPRESSION: Atrophy and small vessel disease.  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  No acute intracranial findings.  Chronic occlusion left internal carotid artery.  Original Report Authenticated By: Elsie Stain, M.D.   Nm Gastric Emptying  08/17/2011  *RADIOLOGY REPORT*  Clinical Data:  Abdominal pain  NUCLEAR MEDICINE GASTRIC EMPTYING SCAN  Technique:  After oral ingestion of radiolabeled meal, sequential abdominal images were obtained for 120 minutes.  Residual percentage of activity remaining within the stomach was calculated at 60 and 120 minutes.  Radiopharmaceutical: 2.0 mCi Tc-14m sulfur colloid.  Comparison:  None  Findings: After 120 minutes there is 23% radiotracer activity remaining in the stomach.  (Normal value is less than 30% remaining at 2 hours)  IMPRESSION:  1.  Normal gastric emptying.  Original Report Authenticated By: Rosealee Albee, M.D.   Ct Abdomen Pelvis W Contrast  08/21/2011  *RADIOLOGY REPORT*  Clinical Data: Fever.  Diarrhea.  Abdominal pain.  CT ABDOMEN AND PELVIS WITH CONTRAST  Technique:  Multidetector CT imaging of the abdomen and pelvis was performed following the standard protocol during bolus administration of intravenous contrast.  Contrast: OMNIPAQUE IOHEXOL 300 MG/ML IV SOLN   Comparison: None.  Findings: Images through the lung bases show mild right lower lobe infiltrate, suspicious for pneumonia.  A tiny less than 1 cm right hepatic lobe cyst is noted but no liver masses are identified.  Gallbladder is unremarkable.  The pancreas, spleen, and adrenal glands are normal in appearance.  Tiny renal cysts are noted bilaterally but there is no evidence of renal mass or hydronephrosis.  A Foley catheter is seen within the bladder which is collapsed.  A subserosal fibroid is seen in the right lower uterine body which measures 4 cm.  Shotty bilateral iliac lymph nodes are seen within the pelvis, none of which are pathologically enlarged.  Wall thickening is seen involving the inferior rectum and anus, and there is soft tissue stranding seen within the presacral fat and the ischiorectal fossae bilaterally.  This is suspicious for proctocolitis.  No abscess identified. Sigmoid diverticulosis is noted, however there is no evidence of  diverticulitis.  IMPRESSION:  1.  Findings consistent with proctocolitis.  No evidence of abscess. 2.  4 cm uterine fibroid. 3.  Sigmoid  diverticulosis.  No radiographic evidence of diverticulitis. 4.  Mild asymmetric lower lobe infiltrate; pneumonia cannot be excluded.  Recommend clinical correlation and follow-up by chest radiograph.  Original Report Authenticated By: Danae Orleans, M.D.   Dg Esophagus  08/28/2011  *RADIOLOGY REPORT*  Clinical Data: Dysphagia.  ESOPHOGRAM/BARIUM SWALLOW  Technique:  Single contrast examination was performed using Omnipaque-300.  Fluoroscopy time:  1.44 minutes.  Comparison:  None  Findings:  Nonspecific esophageal motility disorder with occasional disruption of the primary peristaltic wave and occasional tertiary contractions.  No findings for diffuse esophageal spasm.  There is a widely patent lower esophageal mucosal ring and a very small sliding-type hiatal hernia.  No gastroesophageal reflux was demonstrated.  No intrinsic or  extrinsic lesions of the esophagus were identified and no mucosal abnormalities were seen.  IMPRESSION:  1.  Nonspecific esophageal motility disorder. 2.  Widely patent lower esophageal mucosal ring. 3.  Very small sliding-type hiatal hernia. 4.  No gastroesophageal reflux was demonstrated.  Original Report Authenticated By: P. Loralie Champagne, M.D.   Dg Chest Port 1 View  08/23/2011  *RADIOLOGY REPORT*  Clinical Data: Evaluate pulmonary edema, shortness of breath  PORTABLE CHEST - 1 VIEW  Comparison: 08/22/2011; 08/20/2011  Findings: Grossly unchanged borderline enlarged cardiac silhouette and mediastinal contours.  Improved aeration of the right upper and mid lung with persistent ill-defined heterogeneous opacities in the right lower lung.  There is persistent blunting right costophrenic angle which may suggest a small residual right sided effusion. Grossly unchanged left basilar heterogeneous opacities.  Unchanged bones.  IMPRESSION:  1.  Improved aeration of the right upper and mid lung which given rapid interval change is favored to represent improving pulmonary edema. 2.  Persistent opacities within the right lung base may represent residual asymmetric pulmonary edema, though underlying infection is not excluded. Continued attention on follow-up is recommended.  Original Report Authenticated By: Waynard Reeds, M.D.   Dg Chest Port 1 View  08/22/2011  *RADIOLOGY REPORT*  Clinical Data: Short of breath and wheezing.  PORTABLE CHEST - 1 VIEW  Comparison: 08/20/2011  Findings: There is marked interval change in appearance of the chest with extensive airspace infiltrates now present in the right upper and lower lung zones.  Findings are consistent with pneumonia and may be on the basis of aspiration.  The left lung is reasonably clear with some mild atelectasis in the left lower lobe.  No pleural effusions identified.  IMPRESSION: Significant change in appearance of the chest with extensive pneumonia now  present throughout the right lung.  This could be on the basis of aspiration.  Original Report Authenticated By: Reola Calkins, M.D.   Dg Swallowing Func-no Report  08/24/2011  CLINICAL DATA: objectively assess swallow function   FLUOROSCOPY FOR SWALLOWING FUNCTION STUDY:  Fluoroscopy was provided for swallowing function study, which was  administered by a speech pathologist.  Final results and recommendations  from this study are contained within the speech pathology report.     Echo  - Left ventricle: The cavity size was normal. There was moderate concentric hypertrophy. Systolic function was vigorous. The estimated ejection fraction was in the range of 65% to 70%. Wall motion was normal; there were no regional wall motion abnormalities. - Aortic valve: Mild regurgitation. - Mitral valve: Mild regurgitation. - Pulmonary arteries: PA peak pressure: 49mm Hg (  S).  EEG    Scheduled Meds:    . amLODipine  10 mg Oral Daily  . carbamazepine  200 mg Oral BID  . estradiol  2 g Vaginal 3 times weekly  . feeding supplement  1 Container Oral BID BM  . Flora-Q  1 capsule Oral Daily  . fluconazole  100 mg Oral Daily  . insulin aspart  0-9 Units Subcutaneous TID WC  . lamoTRIgine  25 mg Oral BID  . lisinopril  40 mg Oral Daily  . magic mouthwash  15 mL Oral TID  . metoprolol tartrate  100 mg Oral BID  . metroNIDAZOLE  500 mg Oral Q8H  . nystatin   Topical TID  . pantoprazole  40 mg Oral QAC breakfast  . potassium chloride  40 mEq Oral Daily  . sodium phosphate  1 enema Rectal Once  . warfarin  1 mg Oral ONCE-1800  . DISCONTD: insulin aspart  0-9 Units Subcutaneous Q4H  . DISCONTD: metroNIDAZOLE  500 mg Oral Q12H  . DISCONTD: pantoprazole  40 mg Oral QAC breakfast   Continuous Infusions:  PRN Meds:.acetaminophen, barrier cream, food thickener, hydrALAZINE, levalbuterol, loperamide, ondansetron (ZOFRAN) IV  Assessment & Plan   PNA (pneumonia) (08/22/2011) -Comm Acquired VS  Asp. Patient received a 5 days of ceftriaxone and Azithromycin. Patient on dysphagia 2 diet for aspiration precaution. CXR stable, clinically resolved. Asp precautions.  HYPERTENSION (12/29/2007) Continue with Norvasc , lisinopril and metoprolol.   Filed Vitals:   09/03/11 0930 09/03/11 1000 09/03/11 1030 09/03/11 1300  BP:    122/71  Pulse:    80  Temp:    99 F (37.2 C)  TempSrc:      Resp: 21 16 18    Height:      Weight:      SpO2:    95%    FIBRILLATION, ATRIAL (12/29/2007)  Heart rate better controlled. , metoprolol was increased to 100 mg BID on 12-06. will follow INR. Coumadin restarted. Pharmacy monitoring Coumadin dose closely.  Lab Results  Component Value Date   INR 2.03* 09/03/2011   INR 2.30* 09/02/2011   INR 2.62* 09/01/2011     GERD (gastroesophageal reflux disease) (06/12/2011), Nausea: Patient on Protonix,  Nausea much improved. GI following, H/O Esp.Stricture.  C. difficile colitis  Post Flex Sig today, D/W Dr Rhea Belton, outpt GI follow for polyp, clinically C Diff resolved, add Imodium per him and stop  Flagyl after total 14 days.   Hypokalemia (08/20/2011) -  Hypomagnesemia (08/22/2011)   Replace, give 1gm Mag too as 1.6   Acute respiratory failure (08/22/2011)  - Secondary to pneumonia & Acute On Chr Diastolic CHF - improved.Compensated on RA.   Diastolic CHF, acute (08/23/2011) appears euvolemic.  I will hold on lasix, Patient negative - balance.    Lethargic (08/27/2011)/Encephalopathy: ? Mild Delirium  Much better.  MRI negative for acute stroke . EEG pending remined lab again 09-03-11 to get it read . Neuro on board.   Dysphagia:  Esophagogram showed motility disorder. No endoscopy at this time per GI. Speech following - Dys 2 diet.   Oral Candidiasis/ gluteal, perineal candidiasis:  Continue with fluconazole, started December 4. Add Nystatin powder topically to rectum.   Leukocytosis: Secondary to C diff. WBC decreasing.    Colonic  Polyp - GI follow in 1-2 months.   Daughter phone called and updated 09-02-11 and 09-03-11  Still with foley in place due to risk of worsening skin infection, Patient weak still has ++ thigh  rash will keep it in.   LOS: 12 days     DVT Prophylaxis  Coumadin   See all Orders from today for further details     Leroy Sea M.D 09/03/2011, 1:40 PM  [facility] Office  [phone]

## 2011-09-03 NOTE — Progress Notes (Signed)
ANTICOAGULATION CONSULT NOTE - Follow Up Consult  Pharmacy Consult for coumadin Indication: atrial fibrillation  Allergies  Allergen Reactions  . Ciprofloxacin     REACTION: diarrhea/yeast infection  . Penicillins Hives    Vital Signs: Temp: 98 F (36.7 C) (12/10 0759) Temp src: Oral (12/10 0759) BP: 153/93 mmHg (12/10 0759) Pulse Rate: 85  (12/10 0412)  Labs:  Basename 09/03/11 0808 09/02/11 0758 09/01/11 0807  HGB 13.4 -- 12.8  HCT 39.3 -- 38.9  PLT 344 -- 365  APTT -- -- --  LABPROT 23.3* 25.7* 28.4*  INR 2.03* 2.30* 2.62*  HEPARINUNFRC -- -- --  CREATININE 0.72 -- 0.87  CKTOTAL -- -- --  CKMB -- -- --  TROPONINI -- -- --   Estimated Creatinine Clearance: 63.5 ml/min (by C-G formula based on Cr of 0.72).   Assessment: 75 yo on coumadin for afib. INR at goal with slow trend down over the last few days.  Pt is on fluconazole and was on flagyl with episodes of diarrhea which can make her sensitive to coumadin.   Goal of Therapy:  INR 2-3   Plan:  -coumadin 2mg  today.  Benny Lennert 09/03/2011,9:50 AM

## 2011-09-03 NOTE — Progress Notes (Signed)
Per MD note, patient is not ready to dc. CSW called and left a message with SNF regarding patient's dc plans.SNF stated dtr had completed paperwork and patient could admit when medically ready.  CSW will continue to follow. [place], [place] [phone]

## 2011-09-04 LAB — CBC
HCT: 38.1 % (ref 36.0–46.0)
MCHC: 32.8 g/dL (ref 30.0–36.0)
MCV: 96.2 fL (ref 78.0–100.0)
RDW: 14.4 % (ref 11.5–15.5)

## 2011-09-04 LAB — BASIC METABOLIC PANEL
BUN: 16 mg/dL (ref 6–23)
Calcium: 9.1 mg/dL (ref 8.4–10.5)
Creatinine, Ser: 0.83 mg/dL (ref 0.50–1.10)
GFR calc Af Amer: 79 mL/min — ABNORMAL LOW (ref >90)
GFR calc non Af Amer: 68 mL/min — ABNORMAL LOW (ref >90)
Potassium: 3.8 mEq/L (ref 3.5–5.1)

## 2011-09-04 LAB — PROTIME-INR: INR: 1.74 — ABNORMAL HIGH (ref 0.00–1.49)

## 2011-09-04 MED ORDER — FLUCONAZOLE 100 MG PO TABS: 100.0000 mg | ORAL_TABLET | Freq: Every day | ORAL | Status: AC

## 2011-09-04 MED ORDER — WARFARIN SODIUM 2 MG PO TABS
2.0000 mg | ORAL_TABLET | Freq: Once | ORAL | Status: DC
  Filled 2011-09-04: qty 1

## 2011-09-04 MED ORDER — BARRIER CREAM NON-SPECIFIED: 1.0000 "application " | TOPICAL_CREAM | Freq: Three times a day (TID) | TOPICAL | Status: DC | PRN

## 2011-09-04 MED ORDER — METOPROLOL TARTRATE 100 MG PO TABS: 100.0000 mg | ORAL_TABLET | Freq: Two times a day (BID) | ORAL | Status: DC

## 2011-09-04 MED ORDER — FLORA-Q PO CAPS: 1.0000 | ORAL_CAPSULE | Freq: Every day | ORAL | Status: DC

## 2011-09-04 MED ORDER — NYSTATIN 100000 UNIT/GM EX POWD: 1.0000 g | Freq: Three times a day (TID) | CUTANEOUS | Status: DC

## 2011-09-04 MED ORDER — LOPERAMIDE HCL 2 MG PO CAPS: 2.0000 mg | ORAL_CAPSULE | Freq: Four times a day (QID) | ORAL | Status: AC | PRN

## 2011-09-04 MED ORDER — MAGIC MOUTHWASH: 15.0000 mL | Freq: Three times a day (TID) | ORAL | Status: DC

## 2011-09-04 NOTE — Discharge Summary (Signed)
[name], 75 y.o., DOB 18-Oct-1935, MRN [id number]. Admission date: 08/20/2011 Discharge Date 09/04/2011 Primary MD Gweneth Dimitri, MD, MD Admitting Physician No admitting provider for patient encounter.  Admission Diagnosis  Diarrhea [787.91] Hypokalemia [276.8] Fever [780.60] ALTERED MENTAL STATUS diarrhea  Discharge Diagnosis   Principal Problem:  *PNA (pneumonia) Active Problems:  HYPERTENSION  FIBRILLATION, ATRIAL  ESOPHAGEAL STRICTURE  GERD (gastroesophageal reflux disease)  Diarrhea of infectious origin  Hypokalemia  Hypomagnesemia  Hyperglycemia  Acute respiratory failure  Diastolic CHF, acute  Lethargic  C. difficile colitis      Past Medical History  Diagnosis Date  . Depressive disorder, not elsewhere classified   . Atrial fibrillation   . Unspecified transient cerebral ischemia   . Unspecified cerebral artery occlusion with cerebral infarction   . Unspecified essential hypertension   . Diverticulosis of colon (without mention of hemorrhage)   . Stricture and stenosis of esophagus   . Seizure disorder   . GERD (gastroesophageal reflux disease)   . IBS (irritable bowel syndrome)     Past Surgical History  Procedure Date  . Hemorrhoid surgery   . Benign braintumor     Removal  . Flexible sigmoidoscopy 09/02/2011    Procedure: FLEXIBLE SIGMOIDOSCOPY;  Surgeon: Erick Blinks, MD;  Location: [facility] ENDOSCOPY;  Service: Gastroenterology;  Laterality: N/A;    Consults  Neuro-GI  Significant Tests:  See full reports for all details     Dg Chest 2 View  08/29/2011  *RADIOLOGY REPORT*  Clinical Data: Infiltrate, follow-up  CHEST - 2 VIEW  Comparison: 08/27/2011  Findings: Enlargement of cardiac silhouette. Tortuous aorta. Pulmonary vascularity normal. Minimal bronchitic changes. Improved basilar aeration bilaterally. No definite acute infiltrate, pleural effusion or pneumothorax. Lungs minimally hyperexpanded. Bones demineralized.  IMPRESSION: Enlargement of  cardiac silhouette. Improved basilar aeration.  Original Report Authenticated By: Lollie Marrow, M.D.   Dg Chest 2 View  08/27/2011  *RADIOLOGY REPORT*  Clinical Data: Pulmonary vascular congestion.  Shortness of breath.  CHEST - 2 VIEW  Comparison: 11/29 and 08/20/2011  Findings: The lungs are clear except for  minimal residual atelectasis at the bases.  No effusions.  Heart size and vascularity are normal.  No acute osseous abnormality.  IMPRESSION: Minimal residual atelectasis at the lung bases.  Normal vascularity.  Original Report Authenticated By: Gwynn Burly, M.D.   Dg Chest 2 View  08/20/2011  *RADIOLOGY REPORT*  Clinical Data: Fever and unsteady gait.  CHEST - 2 VIEW  Comparison: 03/09/2011  Findings: Two views of the chest demonstrate clear lungs. Heart and mediastinum are within normal limits.  Trachea is midline.  No evidence for airspace disease or effusions.  IMPRESSION: No acute chest findings.  Original Report Authenticated By: Richarda Overlie, M.D.   Ct Head Wo Contrast  08/27/2011  *RADIOLOGY REPORT*  Clinical Data: Confusion, weakness; altered level of consciousness.  CT HEAD WITHOUT CONTRAST  Technique:  Contiguous axial images were obtained from the base of the skull through the vertex without contrast.  Comparison: CT of the head performed 08/20/2011  Findings: There is no evidence of acute infarction, mass lesion, or intra- or extra-axial hemorrhage on CT.  There is a region of chronic encephalomalacia at the left frontal lobe, with marked ex vacuo dilatation of the frontal horn of the left lateral ventricle, underlying the patient's left frontal craniotomy flap.  Diffuse periventricular and subcortical white matter change likely reflects small vessel ischemic microangiopathy.  Small chronic lacunar infarcts are noted within the basal ganglia.  The posterior  fossa, including the cerebellum, brainstem and fourth ventricle, is within normal limits.  No mass effect or midline shift is  seen.  There is no evidence of fracture; visualized osseous structures are otherwise unremarkable in appearance.  The visualized portions of the orbits are within normal limits.  The paranasal sinuses and mastoid air cells are well-aerated.  No significant soft tissue abnormalities are seen.  IMPRESSION:  1.  No acute intracranial pathology seen on CT. 2.  Postoperative changes at the left frontal lobe. 3.  Diffuse small vessel ischemic microangiopathy and small chronic lacunar infarcts within the basal ganglia.  Original Report Authenticated By: Tonia Ghent, M.D.   Ct Head Wo Contrast  08/20/2011  *RADIOLOGY REPORT*  Clinical Data: Altered mental status.  Several falls yesterday. Unsteady gait today.  Recent urinary tract infection.  History of strokes, seizures, hypertension.  CT HEAD WITHOUT CONTRAST  Technique:  Contiguous axial images were obtained from the base of the skull through the vertex without contrast.  Comparison: 03/08/2011  Findings: There is central cortical atrophy.  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  The patient has had a frontal craniotomy.  Significant periventricular white matter changes appears stable.  No new intra or extra-axial fluid collections or masses.  No intracranial hemorrhage.  Visualized paranasal and mastoid air cells are well-aerated.  IMPRESSION:  1.  Postoperative changes in the left frontal lobe. 2.  Significant atrophy and small vessel disease. 3. No evidence for acute intracranial abnormality.  Original Report Authenticated By: Patterson Hammersmith, M.D.   Mr Brain Wo Contrast  08/28/2011  *RADIOLOGY REPORT*  Clinical Data: Evaluate for acute stroke or bleed. Increased confusion.  History of atrial fibrillation.  Supratherapeutic INR.  MRI HEAD WITHOUT CONTRAST  Technique:  Multiplanar, multiecho pulse sequences of the brain and surrounding structures were obtained according to standard protocol without intravenous contrast.  Comparison: Most  recent CT 08/27/2011.  There is a recent MR 03/08/2011.  Findings: There is no evidence for acute infarction, intracranial hemorrhage, mass lesion, hydrocephalus, or extra-axial fluid. Moderate to severe atrophy is present.  Advanced chronic microvascular ischemic change is present in the periventricular and subcortical white matter.  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  Extensive gliosis surrounds the porencephalic cyst.  No significant midline shift.  Right carotid and basilar artery both demonstrate patency based on flow void.  No flow void in the left internal carotid artery is consistent with chronic occlusion.  There appears to be some reconstitution of the supraclinoid ICA on the left due to collateral flow.  Normal orbits.  No acute sinus disease.  Minimal mastoid fluid is nonspecific.  IMPRESSION: Atrophy and small vessel disease.  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  No acute intracranial findings.  Chronic occlusion left internal carotid artery.  Original Report Authenticated By: Elsie Stain, M.D.   Nm Gastric Emptying  08/17/2011  *RADIOLOGY REPORT*  Clinical Data:  Abdominal pain  NUCLEAR MEDICINE GASTRIC EMPTYING SCAN  Technique:  After oral ingestion of radiolabeled meal, sequential abdominal images were obtained for 120 minutes.  Residual percentage of activity remaining within the stomach was calculated at 60 and 120 minutes.  Radiopharmaceutical: 2.0 mCi Tc-25m sulfur colloid.  Comparison:  None  Findings: After 120 minutes there is 23% radiotracer activity remaining in the stomach.  (Normal value is less than 30% remaining at 2 hours)  IMPRESSION:  1.  Normal gastric emptying.  Original Report Authenticated By: Rosealee Albee, M.D.   Ct Abdomen Pelvis W Contrast  08/21/2011  *RADIOLOGY REPORT*  Clinical Data: Fever.  Diarrhea.  Abdominal pain.  CT ABDOMEN AND PELVIS WITH  CONTRAST  Technique:  Multidetector CT imaging of the abdomen and pelvis was performed following the standard protocol during bolus administration of intravenous contrast.  Contrast: OMNIPAQUE IOHEXOL 300 MG/ML IV SOLN  Comparison: None.  Findings: Images through the lung bases show mild right lower lobe infiltrate, suspicious for pneumonia.  A tiny less than 1 cm right hepatic lobe cyst is noted but no liver masses are identified.  Gallbladder is unremarkable.  The pancreas, spleen, and adrenal glands are normal in appearance.  Tiny renal cysts are noted bilaterally but there is no evidence of renal mass or hydronephrosis.  A Foley catheter is seen within the bladder which is collapsed.  A subserosal fibroid is seen in the right lower uterine body which measures 4 cm.  Shotty bilateral iliac lymph nodes are seen within the pelvis, none of which are pathologically enlarged.  Wall thickening is seen involving the inferior rectum and anus, and there is soft tissue stranding seen within the presacral fat and the ischiorectal fossae bilaterally.  This is suspicious for proctocolitis.  No abscess identified. Sigmoid diverticulosis is noted, however there is no evidence of diverticulitis.  IMPRESSION:  1.  Findings consistent with proctocolitis.  No evidence of abscess. 2.  4 cm uterine fibroid. 3.  Sigmoid  diverticulosis.  No radiographic evidence of diverticulitis. 4.  Mild asymmetric lower lobe infiltrate; pneumonia cannot be excluded.  Recommend clinical correlation and follow-up by chest radiograph.  Original Report Authenticated By: Danae Orleans, M.D.   Dg Esophagus  08/28/2011  *RADIOLOGY REPORT*  Clinical Data: Dysphagia.  ESOPHOGRAM/BARIUM SWALLOW  Technique:  Single contrast examination was performed using Omnipaque-300.  Fluoroscopy time:  1.44 minutes.  Comparison:  None  Findings:  Nonspecific esophageal motility disorder with occasional disruption of the primary peristaltic wave and occasional  tertiary contractions.  No findings for diffuse esophageal spasm.  There is a widely patent lower esophageal mucosal ring and a very small sliding-type hiatal hernia.  No gastroesophageal reflux was demonstrated.  No intrinsic or extrinsic lesions of the esophagus were identified and no mucosal abnormalities were seen.  IMPRESSION:  1.  Nonspecific esophageal motility disorder. 2.  Widely patent lower esophageal mucosal ring. 3.  Very small sliding-type hiatal hernia. 4.  No gastroesophageal reflux was demonstrated.  Original Report Authenticated By: P. Loralie Champagne, M.D.   Dg Chest Port 1 View  08/23/2011  *RADIOLOGY REPORT*  Clinical Data: Evaluate pulmonary edema, shortness of breath  PORTABLE CHEST - 1 VIEW  Comparison: 08/22/2011; 08/20/2011  Findings: Grossly unchanged borderline enlarged cardiac silhouette and mediastinal contours.  Improved aeration of the right upper and mid lung with persistent ill-defined heterogeneous opacities in the right lower lung.  There is persistent blunting right costophrenic angle which may suggest a small residual right sided effusion. Grossly unchanged left basilar heterogeneous opacities.  Unchanged bones.  IMPRESSION:  1.  Improved aeration of the right upper and mid lung which given rapid interval change is favored to represent improving pulmonary edema. 2.  Persistent opacities within the right lung base may represent residual asymmetric pulmonary edema, though underlying infection is not excluded. Continued attention on follow-up is recommended.  Original Report Authenticated By: Waynard Reeds, M.D.   Dg Chest Port 1 View  08/22/2011  *RADIOLOGY REPORT*  Clinical  Data: Short of breath and wheezing.  PORTABLE CHEST - 1 VIEW  Comparison: 08/20/2011  Findings: There is marked interval change in appearance of the chest with extensive airspace infiltrates now present in the right upper and lower lung zones.  Findings are consistent with pneumonia and may be on the  basis of aspiration.  The left lung is reasonably clear with some mild atelectasis in the left lower lobe.  No pleural effusions identified.  IMPRESSION: Significant change in appearance of the chest with extensive pneumonia now present throughout the right lung.  This could be on the basis of aspiration.  Original Report Authenticated By: Reola Calkins, M.D.   Dg Swallowing Func-no Report  08/24/2011  CLINICAL DATA: objectively assess swallow function   FLUOROSCOPY FOR SWALLOWING FUNCTION STUDY:  Fluoroscopy was provided for swallowing function study, which was  administered by a speech pathologist.  Final results and recommendations  from this study are contained within the speech pathology report.     EEG This is an abnormal awake and drowsy EEG due to presence of intermittent left anterior and mid frontotemporal slowing. The finding may be suggestive of an underlying structural or functional abnormality of an etiology in that head region. Clinical correlation is suggested.  ECHO  Echo  - Left ventricle: The cavity size was normal. There was moderate concentric hypertrophy. Systolic function was vigorous. The estimated ejection fraction was in the range of 65% to 70%. Wall motion was normal; there were no regional wall motion abnormalities. - Aortic valve: Mild regurgitation. - Mitral valve: Mild regurgitation. - Pulmonary arteries: PA peak pressure: 49mm Hg (S).   Flex-Sig- advanced to almost complete Colonoscopy - colonic polyp   Hospital Course See H&P, Labs, Consult and Test reports for all details in brief, patient was admitted for  Comm Acquired Pneumonia causing mild weakness , was treated with appropriate Antibiotics, also had Diarrhea upon admission due to C diff colitis for which she had 14 days of PO flagyl, now stopped and on PRN Imodium per GI Dr Rhea Belton, have discussed with Pharm on Flagyl (08-21-11 through 09-02-11) , she was also seen by Neuro for her weakness and had an  unremarkable workup (EEG-consistent with previous Brain Tumor History). She has mild baseline aphasia, will need good skin care for Fungal dermatitis, foley to be removed in 3-4 days once skin condition better. Follow with GI for polyp found on Colonoscopy this admission. If C diff reoccurs consider PO Vanco and stopping Protonix. Stop Imodium after 2-3 days of PRN Use.  Other issues -  HYPERTENSION (12/29/2007) Continue with Norvasc , lisinopril and metoprolol. Monitor BP and adjust dose.  Filed Vitals:   09/04/11 0700 09/04/11 0830 09/04/11 0836 09/04/11 1231  BP:  140/83 140/83 154/86  Pulse: 79 87 94 77  Temp:   98.1 F (36.7 C) 97.9 F (36.6 C)  TempSrc:   Oral Oral  Resp: 18 13 22 16   Height:      Weight:      SpO2: 94% 95% 95% 98%     FIBRILLATION, ATRIAL (12/29/2007)  Heart rate better controlled. metoprolol was increased to 100 mg BID on 12-06. will follow INR. Coumadin restarted. Monitor INR & Coumadin dose closely.   Lab Results  Component Value Date   INR 1.74* 09/04/2011   INR 2.03* 09/03/2011   INR 2.30* 09/02/2011     GERD (gastroesophageal reflux disease) (06/12/2011), Nausea: Patient on Protonix, Nausea much improved. GI following, H/O Esp.Stricture will follow with GI outpt.  Acute respiratory failure (08/22/2011) - Secondary to pneumonia & Acute On Chr Diastolic CHF - improved.Compensated on RA.    Diastolic CHF, acute (08/23/2011) appears euvolemic. Monitor weights, I&Os, Pulse oximetery, PRN lasix as needed.   Lethargic (08/27/2011)/Encephalopathy: ? Mild Delirium  Much better. Close to baseline. Likely due to Pneumonia and C diff. MRI negative for acute stroke . EEG stable. D/W Dr Lyman Speller today. No further workup.   Dysphagia:  Esophagogram showed motility disorder. No endoscopy at this time per GI. Speech following - Dys 2 diet. Speech follow up continue, Full Asp precautions.   Oral Candidiasis/ gluteal, perineal candidiasis:  Continue with  fluconazole, started December 4. Add Nystatin powder topically to rectum.    Leukocytosis: Secondary to C diff. Monitor.   Colonic Polyp - GI follow in 1-2 months.   Updated daughter over her phone agrees with the plan.   Today   Subjective:   [name] today has no headache,no chest abdominal pain,no new weakness tingling or numbness, feels much better.    Objective:   Blood pressure 154/86, pulse 77, temperature 97.9 F (36.6 C), temperature source Oral, resp. rate 16, height 5\' 6"  (1.676 m), weight 71.5 kg (157 lb 10.1 oz), SpO2 98.00%.  Intake/Output Summary (Last 24 hours) at 09/04/11 1239 Last data filed at 09/04/11 0900  Gross per 24 hour  Intake    600 ml  Output   1154 ml  Net   -554 ml    Exam Awake Alert, Oriented *3, No new F.N deficits, Normal affect [place].AT,PERRAL Supple Neck,No JVD, No cervical lymphadenopathy appriciated.  Symmetrical Chest wall movement, Good air movement bilaterally, CTAB RRR,No Gallops,Rubs or new Murmurs, No Parasternal Heave +ve B.Sounds, Abd Soft, Non tender, No organomegaly appriciated, No rebound -guarding or rigidity. No Cyanosis, Clubbing or edema, No new Rash or bruise  Data Review    CBC w Diff: Lab Results  Component Value Date   WBC 11.6* 09/04/2011   HGB 12.5 09/04/2011   HCT 38.1 09/04/2011   PLT 366 09/04/2011   LYMPHOPCT 3* 08/20/2011   MONOPCT 10 08/20/2011   EOSPCT 0 08/20/2011   BASOPCT 0 08/20/2011   CMP: Lab Results  Component Value Date   NA 136 09/04/2011   K 3.8 09/04/2011   CL 104 09/04/2011   CO2 25 09/04/2011   BUN 16 09/04/2011   CREATININE 0.83 09/04/2011   PROT 6.2 08/21/2011   ALBUMIN 2.7* 08/21/2011   BILITOT 0.7 08/21/2011   ALKPHOS 66 08/21/2011   AST 16 08/21/2011   ALT 12 08/21/2011  . Discharge Instructions     [facility]  [phone]  Follow with Primary MD Gweneth Dimitri, MD, Or SNF MD  in 2 days   Get CBC, CMP, checked 2 days by Primary MD and again as instructed  by your Primary MD. Get a 2 view Chest X ray done in 2 days.  Get Medicines reviewed and adjusted.  Please request your Prim.MD to go over all Hospital Tests and Procedure/Radiological results at the follow up, please get all Hospital records sent to your Prim MD by signing hospital release before you go home.  Activity: Needs repeat PT Eval - Fall precautions use walker & assistance.  Diet: Dysphagia 2 diet, Heart Haelthy,  Fluid restriction 1.8 lit/day, Aspiration precautions.  For Heart failure patients - Check your Weight same time everyday, if you gain over 2 pounds, or you develop in leg swelling, experience more shortness of breath or chest pain, call your Primary  MD immediately. Follow Cardiac Low Salt Diet and 1.8 lit/day fluid restriction.  Disposition Home  If you experience worsening of your admission symptoms, develop shortness of breath, life threatening emergency, suicidal or homicidal thoughts you must seek medical attention immediately by calling 911 or calling your MD immediately  if symptoms less severe.  You Must read complete instructions/literature along with all the possible adverse reactions/side effects for all the Medicines you take and that have been prescribed to you. Take any new Medicines after you have completely understood and accpet all the possible adverse reactions/side effects.   Do not drive if your were admitted for syncope or siezures until you have seen by Primary MD or a Neurologist and advised to drive.      Follow-up Information    Follow up with [facility], MD. Make an appointment in 2 days.   Contact information:   [address] [place] [address] [phone]       Follow up with Beverley Fiedler, MD. Make an appointment in 2 weeks.   Contact information:   [address] [place] [address] [phone]       Follow up with Gates Rigg, MD. Make an appointment in 2 weeks.    Contact information:   [address], [address] [facility] [place] [address] [phone]          Discharge Medications    Chloe, Flis  Home Medication Instructions VOH:[id number]   Printed on:09/04/11 1250  Medication Information                    warfarin (COUMADIN) 2 MG tablet Take 2 mg by mouth as directed. Takes( 1/2 tablet ) 1mg  on  Mon Wed and Friday then (1 tablet) 2mg  on Tues Thurs Sat and Sunday           lisinopril (PRINIVIL,ZESTRIL) 40 MG tablet Take 40 mg by mouth daily.            amLODipine (NORVASC) 10 MG tablet Take 10 mg by mouth daily.            saccharomyces boulardii (FLORASTOR) 250 MG capsule Take 250 mg by mouth 2 (two) times daily.             ondansetron (ZOFRAN) 4 MG tablet Take 4 mg by mouth at bedtime.            conjugated estrogens (PREMARIN) vaginal cream Place 30 g vaginally daily.             lamoTRIgine (LAMICTAL) 25 MG tablet Take 25 mg by mouth 2 (two) times daily.             cholecalciferol (VITAMIN D) 1000 UNITS tablet Take 4,000 Units by mouth daily.             hyoscyamine (LEVSIN SL) 0.125 MG SL tablet Place 0.125 mg under the tongue every 4 (four) hours as needed. For stomach cramps            carbamazepine (TEGRETOL) 200 MG tablet Take 200 mg by mouth 2 (two) times daily.             estradiol (ESTRACE) 0.1 MG/GM vaginal cream Place 2 g vaginally 3 (three) times a week.             pantoprazole (PROTONIX) 40 MG tablet Take 40 mg by mouth daily.  barrier cream (NON-SPECIFIED) CREA Apply 1 application topically 3 (three) times daily as needed.           Flora-Q (FLORA-Q) CAPS Take 1 capsule by mouth daily.           Alum & Mag Hydroxide-Simeth (MAGIC MOUTHWASH) SOLN Take 15 mLs by mouth 3 (three) times daily.           metoprolol (LOPRESSOR) 100 MG tablet Take 1 tablet (100 mg total) by mouth 2 (two) times daily.           nystatin (NYSTOP) 100000 UNIT/GM  POWD Apply 1 g (100,000 Units total) topically 3 (three) times daily. Apply to affected back,thigh-peri rectal skin           fluconazole (DIFLUCAN) 100 MG tablet Take 1 tablet (100 mg total) by mouth daily.           loperamide (IMODIUM) 2 MG capsule Take 1 capsule (2 mg total) by mouth every 6 (six) hours as needed for diarrhea or loose stools.             Total Time in preparing paper work, data evaluation and todays exam - 35 minutes  Leroy Sea M.D 09/04/2011, 12:39 PM  [facility] Office  [phone]

## 2011-09-04 NOTE — Progress Notes (Signed)
ANTICOAGULATION CONSULT NOTE - Follow Up Consult  Pharmacy Consult for coumadin Indication: atrial fibrillation  Allergies  Allergen Reactions  . Ciprofloxacin     REACTION: diarrhea/yeast infection  . Keppra Nausea Only    Nausea developed while on this med, worsened when dose increased.  Nausea ceased when med was d/cd.  Marland Kitchen Penicillins Hives    Vital Signs: Temp: 98.1 F (36.7 C) (12/11 0836) Temp src: Oral (12/11 0836) BP: 140/83 mmHg (12/11 0836) Pulse Rate: 94  (12/11 0836)  Labs:  Basename 09/04/11 0540 09/03/11 0808 09/02/11 0758  HGB 12.5 13.4 --  HCT 38.1 39.3 --  PLT 366 344 --  APTT -- -- --  LABPROT 20.7* 23.3* 25.7*  INR 1.74* 2.03* 2.30*  HEPARINUNFRC -- -- --  CREATININE 0.83 0.72 --  CKTOTAL -- -- --  CKMB -- -- --  TROPONINI -- -- --   Estimated Creatinine Clearance: 61.2 ml/min (by C-G formula based on Cr of 0.83).   Goal of Therapy:  INR 2-3  Assessment: 75 yo on coumadin for afib. Coumadin dose was missed yesterday as reflected on subtherapeutic INR.  Pt continues on fluconazole which can make increase her sensitive to coumadin. With subtherapeutic INR and plans for possible discharge to SNF, will give Coumadin dose earlier today (noon as opposed to the standard 1800 administration time).    Plan:  -Coumadin 2mg  today.  [name], Judie Bonus 09/04/2011,9:40 AM

## 2011-09-04 NOTE — Progress Notes (Signed)
Pt IV and monitor D/C'd Pt dressed, pt family notified of discharge to SNF. (Daughter and husband notified)

## 2011-09-04 NOTE — Progress Notes (Signed)
CSW faxed dc summary and medications to SNF. CSW received permission for patient to admit to SNF today. CSW prepared dc packet. CSW informed patient, patient's dtr and RN of dc and all were agreeable. CSW coordinated transportation via [place]. RN agreed to call dtr when ambulance arrives. CSW is signing off. [place], [place] [phone]

## 2011-09-04 NOTE — Procedures (Signed)
EEG NUMBER:  [id number].  HISTORY:  This is a 75 years old woman referred for rule out seizures.  MEDICATIONS:  No anticonvulsant medications, per report.  CONDITION OF RECORDING:  This 16-lead EEG was performed on this patient in an awake and drowsy states.  Background rhythm:  Background patterns in wakefulness were well organized with a well-sustained posterior dominant rhythm of 8-9.5 Hz, symmetrical and reactive to eye opening and closing.  Drowsiness was associated with mild attenuation of voltage and slowing of frequencies.  Abnormal potentials:  Intermittent left anterior and mid-frontotemporal slowing was noted.  No epileptiform activity was noted.  ACTIVATION PROCEDURES:  Hyperventilation was not performed.  Photic stimulation did not activate tracing.  EKG:  Single-channel of EKG monitoring was un-interruptible.  IMPRESSION:  This is an abnormal awake and drowsy EEG due to presence of intermittent left anterior and mid frontotemporal slowing. The finding may be suggestive of an underlying structural or functional abnormality of an etiology in that head region.  Clinical correlation is suggested.          ______________________________ Carmell Austria, MD    NF:AOZH D:  09/01/2011 07:49:32  T:  09/01/2011 08:36:55  Job #:  [id number]

## 2011-09-27 ENCOUNTER — Telehealth: Payer: Self-pay

## 2011-09-27 NOTE — Telephone Encounter (Signed)
Message copied by Michele Mcalpine on Thu Sep 27, 2011  2:56 PM ------      Message from: Dianah Field      Created: Mon Sep 03, 2011 12:09 PM       [name] has colon polyp not removed per Dr. Lauro Franklin report below.  Can u schedule office appointment with him in 2 or 3 months when he returns to office?            COLONOSCOPY PROCEDURE REPORT      09/02/11      ENDOSCOPIC IMPRESSION:      1) Sessile polyp in the descending colon.  Polypectomy not      attempted due to anticoagulation status.      2) Otherwise normal examination with no endoscopic sign of      unresolved infection.             RECOMMENDATIONS:      1) Complete 14 days of metronidazole therapy.  No evidence for      unresolved c. difficle colitis.      2) Begin antidiarrheals, loperamide or lomotil and titrate as      necessary to control loose stools.      3) Continue topical and oral therapy with local skin care to      severe perineal dermatitis (likely fungal).      4) Discussion with patient and primary care MD regarding repeat      colonoscopy for polypectomy at a time when anticoagulation can be      held (or bridged to procedure with enoxaparin).      Carie Caddy. Pyrtle, MD                  Thanks, Maralyn Sago

## 2011-09-27 NOTE — Telephone Encounter (Signed)
Pt scheduled to see Dr. Arlyce Dice 10/30/11@10 :15am. Letter mailed to pt.

## 2011-10-30 ENCOUNTER — Ambulatory Visit: Payer: Medicare Other | Admitting: Gastroenterology

## 2011-11-23 ENCOUNTER — Encounter (HOSPITAL_COMMUNITY): Payer: Self-pay | Admitting: *Deleted

## 2011-11-23 ENCOUNTER — Inpatient Hospital Stay (HOSPITAL_COMMUNITY)
Admission: EM | Admit: 2011-11-23 | Discharge: 2011-11-30 | DRG: 469 | Disposition: A | Discharge status: Skilled Nursing Facility | Payer: Medicare Other | Attending: Family Medicine | Admitting: Family Medicine

## 2011-11-23 ENCOUNTER — Emergency Department (HOSPITAL_COMMUNITY): Payer: Medicare Other

## 2011-11-23 ENCOUNTER — Other Ambulatory Visit: Payer: Self-pay

## 2011-11-23 DIAGNOSIS — D72829 Elevated white blood cell count, unspecified: Secondary | ICD-10-CM | POA: Diagnosis present

## 2011-11-23 DIAGNOSIS — I5033 Acute on chronic diastolic (congestive) heart failure: Secondary | ICD-10-CM | POA: Diagnosis present

## 2011-11-23 DIAGNOSIS — Y9289 Other specified places as the place of occurrence of the external cause: Secondary | ICD-10-CM

## 2011-11-23 DIAGNOSIS — F32A Depression, unspecified: Secondary | ICD-10-CM | POA: Diagnosis present

## 2011-11-23 DIAGNOSIS — E876 Hypokalemia: Secondary | ICD-10-CM | POA: Diagnosis present

## 2011-11-23 DIAGNOSIS — J9600 Acute respiratory failure, unspecified whether with hypoxia or hypercapnia: Secondary | ICD-10-CM | POA: Diagnosis not present

## 2011-11-23 DIAGNOSIS — I4891 Unspecified atrial fibrillation: Secondary | ICD-10-CM | POA: Diagnosis present

## 2011-11-23 DIAGNOSIS — I69920 Aphasia following unspecified cerebrovascular disease: Secondary | ICD-10-CM

## 2011-11-23 DIAGNOSIS — T45515A Adverse effect of anticoagulants, initial encounter: Secondary | ICD-10-CM | POA: Diagnosis present

## 2011-11-23 DIAGNOSIS — R791 Abnormal coagulation profile: Secondary | ICD-10-CM | POA: Diagnosis present

## 2011-11-23 DIAGNOSIS — S72001A Fracture of unspecified part of neck of right femur, initial encounter for closed fracture: Secondary | ICD-10-CM

## 2011-11-23 DIAGNOSIS — S72009A Fracture of unspecified part of neck of unspecified femur, initial encounter for closed fracture: Principal | ICD-10-CM | POA: Diagnosis present

## 2011-11-23 DIAGNOSIS — I509 Heart failure, unspecified: Secondary | ICD-10-CM | POA: Diagnosis present

## 2011-11-23 DIAGNOSIS — F3289 Other specified depressive episodes: Secondary | ICD-10-CM | POA: Diagnosis present

## 2011-11-23 DIAGNOSIS — Z7901 Long term (current) use of anticoagulants: Secondary | ICD-10-CM

## 2011-11-23 DIAGNOSIS — I10 Essential (primary) hypertension: Secondary | ICD-10-CM | POA: Diagnosis present

## 2011-11-23 DIAGNOSIS — F329 Major depressive disorder, single episode, unspecified: Secondary | ICD-10-CM | POA: Diagnosis present

## 2011-11-23 DIAGNOSIS — W010XXA Fall on same level from slipping, tripping and stumbling without subsequent striking against object, initial encounter: Secondary | ICD-10-CM | POA: Diagnosis present

## 2011-11-23 DIAGNOSIS — I5032 Chronic diastolic (congestive) heart failure: Secondary | ICD-10-CM | POA: Diagnosis not present

## 2011-11-23 DIAGNOSIS — D649 Anemia, unspecified: Secondary | ICD-10-CM | POA: Diagnosis not present

## 2011-11-23 DIAGNOSIS — Z8673 Personal history of transient ischemic attack (TIA), and cerebral infarction without residual deficits: Secondary | ICD-10-CM | POA: Diagnosis present

## 2011-11-23 DIAGNOSIS — I5031 Acute diastolic (congestive) heart failure: Secondary | ICD-10-CM | POA: Diagnosis not present

## 2011-11-23 DIAGNOSIS — K219 Gastro-esophageal reflux disease without esophagitis: Secondary | ICD-10-CM | POA: Diagnosis present

## 2011-11-23 LAB — COMPREHENSIVE METABOLIC PANEL
ALT: 14 U/L (ref 0–35)
ALT: 13 U/L (ref 0–35)
Alkaline Phosphatase: 79 U/L (ref 39–117)
Alkaline Phosphatase: 71 U/L (ref 39–117)
BUN: 15 mg/dL (ref 6–23)
BUN: 14 mg/dL (ref 6–23)
CO2: 32 mEq/L (ref 19–32)
CO2: 28 mEq/L (ref 19–32)
GFR calc Af Amer: 78 mL/min — ABNORMAL LOW (ref >90)
GFR calc Af Amer: >90 mL/min (ref >90)
GFR calc non Af Amer: 67 mL/min — ABNORMAL LOW (ref >90)
GFR calc non Af Amer: 82 mL/min — ABNORMAL LOW (ref >90)
Glucose, Bld: 107 mg/dL — ABNORMAL HIGH (ref 70–99)
Glucose, Bld: 124 mg/dL — ABNORMAL HIGH (ref 70–99)
Potassium: 3.3 mEq/L — ABNORMAL LOW (ref 3.5–5.1)
Potassium: 3.2 mEq/L — ABNORMAL LOW (ref 3.5–5.1)
Sodium: 140 mEq/L (ref 135–145)
Total Protein: 6.8 g/dL (ref 6.0–8.3)

## 2011-11-23 LAB — CBC
HCT: 37.4 % (ref 36.0–46.0)
Hemoglobin: 14.3 g/dL (ref 12.0–15.0)
Hemoglobin: 13.1 g/dL (ref 12.0–15.0)
MCH: 32.9 pg (ref 26.0–34.0)
MCV: 94.3 fL (ref 78.0–100.0)
Platelets: 255 10*3/uL (ref 150–400)
RBC: 4.35 MIL/uL (ref 3.87–5.11)
WBC: 13.5 10*3/uL — ABNORMAL HIGH (ref 4.0–10.5)
WBC: 14.7 10*3/uL — ABNORMAL HIGH (ref 4.0–10.5)

## 2011-11-23 LAB — CARDIAC PANEL(CRET KIN+CKTOT+MB+TROPI)
CK, MB: 2.6 ng/mL (ref 0.3–4.0)
Total CK: 80 U/L (ref 7–177)
Troponin I: <0.3 ng/mL (ref <0.30)

## 2011-11-23 LAB — URINALYSIS, ROUTINE W REFLEX MICROSCOPIC
Bilirubin Urine: NEGATIVE
Ketones, ur: 40 mg/dL — AB
Nitrite: NEGATIVE
Protein, ur: NEGATIVE mg/dL
Urobilinogen, UA: 0.2 mg/dL (ref 0.0–1.0)

## 2011-11-23 LAB — PROTIME-INR: INR: 2.72 — ABNORMAL HIGH (ref 0.00–1.49)

## 2011-11-23 LAB — PHOSPHORUS: Phosphorus: 2.6 mg/dL (ref 2.3–4.6)

## 2011-11-23 LAB — PRO B NATRIURETIC PEPTIDE: Pro B Natriuretic peptide (BNP): 794.2 pg/mL — ABNORMAL HIGH (ref 0–125)

## 2011-11-23 LAB — DIFFERENTIAL
Eosinophils Relative: 0 % (ref 0–5)
Lymphocytes Relative: 8 % — ABNORMAL LOW (ref 12–46)
Lymphocytes Relative: 7 % — ABNORMAL LOW (ref 12–46)
Lymphs Abs: 1.1 10*3/uL (ref 0.7–4.0)
Monocytes Absolute: 1 10*3/uL (ref 0.1–1.0)
Monocytes Relative: 7 % (ref 3–12)
Monocytes Relative: 7 % (ref 3–12)
Neutro Abs: 12.7 10*3/uL — ABNORMAL HIGH (ref 1.7–7.7)

## 2011-11-23 LAB — MAGNESIUM: Magnesium: 1.4 mg/dL — ABNORMAL LOW (ref 1.5–2.5)

## 2011-11-23 MED ORDER — PANTOPRAZOLE SODIUM 40 MG PO TBEC
40.0000 mg | DELAYED_RELEASE_TABLET | Freq: Every day | ORAL | Status: DC
  Administered 2011-11-24 – 2011-11-29 (×6): 40 mg via ORAL
  Filled 2011-11-23 (×7): qty 1

## 2011-11-23 MED ORDER — SACCHAROMYCES BOULARDII 250 MG PO CAPS
250.0000 mg | ORAL_CAPSULE | Freq: Two times a day (BID) | ORAL | Status: DC
  Administered 2011-11-23 – 2011-11-30 (×14): 250 mg via ORAL
  Filled 2011-11-23 (×15): qty 1

## 2011-11-23 MED ORDER — NITROFURANTOIN MONOHYD MACRO 100 MG PO CAPS
100.0000 mg | ORAL_CAPSULE | Freq: Two times a day (BID) | ORAL | Status: DC
  Administered 2011-11-23 – 2011-11-30 (×14): 100 mg via ORAL
  Filled 2011-11-23 (×15): qty 1

## 2011-11-23 MED ORDER — SODIUM CHLORIDE 0.9 % IJ SOLN
3.0000 mL | Freq: Two times a day (BID) | INTRAMUSCULAR | Status: DC
  Administered 2011-11-23 – 2011-11-26 (×6): 3 mL via INTRAVENOUS

## 2011-11-23 MED ORDER — POTASSIUM CHLORIDE CRYS ER 20 MEQ PO TBCR
20.0000 meq | EXTENDED_RELEASE_TABLET | Freq: Once | ORAL | Status: AC
  Administered 2011-11-23: 20 meq via ORAL
  Filled 2011-11-23: qty 1

## 2011-11-23 MED ORDER — VITAMIN D3 25 MCG (1000 UNIT) PO TABS
4000.0000 [IU] | ORAL_TABLET | Freq: Every day | ORAL | Status: DC
  Administered 2011-11-24 – 2011-11-30 (×7): 4000 [IU] via ORAL
  Filled 2011-11-23 (×7): qty 4

## 2011-11-23 MED ORDER — LAMOTRIGINE 25 MG PO TABS
25.0000 mg | ORAL_TABLET | Freq: Two times a day (BID) | ORAL | Status: DC
  Administered 2011-11-23 – 2011-11-29 (×11): 25 mg via ORAL
  Filled 2011-11-23 (×15): qty 1

## 2011-11-23 MED ORDER — ESTRADIOL 0.1 MG/GM VA CREA
2.0000 g | TOPICAL_CREAM | VAGINAL | Status: DC
  Administered 2011-11-28 – 2011-11-30 (×2): 0.25 via VAGINAL
  Filled 2011-11-23 (×2): qty 42.5

## 2011-11-23 MED ORDER — ASPIRIN 81 MG PO TBEC: 81.0000 mg | DELAYED_RELEASE_TABLET | Freq: Every day | ORAL | Status: DC

## 2011-11-23 MED ORDER — WARFARIN - PHARMACIST DOSING INPATIENT
Freq: Every day | Status: DC
  Filled 2011-11-23 (×2): qty 1

## 2011-11-23 MED ORDER — CITALOPRAM HYDROBROMIDE 20 MG PO TABS
20.0000 mg | ORAL_TABLET | Freq: Every day | ORAL | Status: DC
  Administered 2011-11-24 – 2011-11-30 (×7): 20 mg via ORAL
  Filled 2011-11-23 (×7): qty 1

## 2011-11-23 MED ORDER — ASPIRIN EC 81 MG PO TBEC
81.0000 mg | DELAYED_RELEASE_TABLET | Freq: Every day | ORAL | Status: DC
  Administered 2011-11-24 – 2011-11-30 (×7): 81 mg via ORAL
  Filled 2011-11-23 (×7): qty 1

## 2011-11-23 MED ORDER — SODIUM CHLORIDE 0.9 % IV SOLN
INTRAVENOUS | Status: DC
  Administered 2011-11-23 – 2011-11-24 (×2): via INTRAVENOUS

## 2011-11-23 MED ORDER — NYSTATIN 100000 UNIT/GM EX POWD
1.0000 g | Freq: Three times a day (TID) | CUTANEOUS | Status: DC
  Administered 2011-11-23 – 2011-11-30 (×17): 100000 [IU] via TOPICAL
  Filled 2011-11-23: qty 15

## 2011-11-23 MED ORDER — ESTROGENS, CONJUGATED 0.625 MG/GM VA CREA
2.0000 g | TOPICAL_CREAM | Freq: Every day | VAGINAL | Status: DC
  Administered 2011-11-24 – 2011-11-30 (×7): 0.25 via VAGINAL
  Filled 2011-11-23: qty 42.5

## 2011-11-23 MED ORDER — SODIUM CHLORIDE 0.9 % IV SOLN
Freq: Once | INTRAVENOUS | Status: AC
  Administered 2011-11-23: 17:00:00 via INTRAVENOUS

## 2011-11-23 MED ORDER — CARBAMAZEPINE 200 MG PO TABS
200.0000 mg | ORAL_TABLET | Freq: Two times a day (BID) | ORAL | Status: DC
  Administered 2011-11-23 – 2011-11-30 (×14): 200 mg via ORAL
  Filled 2011-11-23 (×16): qty 1

## 2011-11-23 MED ORDER — FENTANYL CITRATE 0.05 MG/ML IJ SOLN
50.0000 ug | INTRAMUSCULAR | Status: DC | PRN
  Administered 2011-11-23 (×2): 50 ug via INTRAVENOUS
  Filled 2011-11-23 (×3): qty 2

## 2011-11-23 MED ORDER — LORAZEPAM 0.5 MG PO TABS
0.5000 mg | ORAL_TABLET | Freq: Every evening | ORAL | Status: DC | PRN
  Administered 2011-11-29: 0.5 mg via ORAL
  Filled 2011-11-23: qty 1

## 2011-11-23 MED ORDER — WARFARIN - PHARMACIST DOSING INPATIENT: Freq: Every day | Status: DC

## 2011-11-23 MED ORDER — FENTANYL CITRATE 0.05 MG/ML IJ SOLN
25.0000 ug | Freq: Once | INTRAMUSCULAR | Status: AC
  Administered 2011-11-23: 25 ug via INTRAVENOUS
  Filled 2011-11-23: qty 2

## 2011-11-23 MED ORDER — MAGIC MOUTHWASH
15.0000 mL | Freq: Three times a day (TID) | ORAL | Status: DC
  Administered 2011-11-23 – 2011-11-30 (×19): 15 mL via ORAL
  Filled 2011-11-23 (×22): qty 15

## 2011-11-23 MED ORDER — LISINOPRIL 40 MG PO TABS
40.0000 mg | ORAL_TABLET | Freq: Every day | ORAL | Status: DC
  Administered 2011-11-24 – 2011-11-30 (×7): 40 mg via ORAL
  Filled 2011-11-23 (×7): qty 1

## 2011-11-23 MED ORDER — HYOSCYAMINE SULFATE 0.125 MG SL SUBL
0.1250 mg | SUBLINGUAL_TABLET | SUBLINGUAL | Status: DC | PRN
  Filled 2011-11-23: qty 1

## 2011-11-23 MED ORDER — ONDANSETRON HCL 4 MG PO TABS
4.0000 mg | ORAL_TABLET | Freq: Every day | ORAL | Status: DC
  Administered 2011-11-23 – 2011-11-29 (×7): 4 mg via ORAL
  Filled 2011-11-23 (×8): qty 1

## 2011-11-23 MED ORDER — METOPROLOL TARTRATE 100 MG PO TABS
100.0000 mg | ORAL_TABLET | Freq: Two times a day (BID) | ORAL | Status: DC
  Administered 2011-11-23 – 2011-11-30 (×14): 100 mg via ORAL
  Filled 2011-11-23 (×16): qty 1

## 2011-11-23 MED ORDER — AMLODIPINE BESYLATE 10 MG PO TABS
10.0000 mg | ORAL_TABLET | Freq: Every day | ORAL | Status: DC
  Administered 2011-11-24 – 2011-11-26 (×3): 10 mg via ORAL
  Filled 2011-11-23 (×3): qty 1

## 2011-11-23 MED ORDER — BARRIER CREAM NON-SPECIFIED
1.0000 "application " | TOPICAL_CREAM | Freq: Three times a day (TID) | TOPICAL | Status: DC | PRN
  Filled 2011-11-23: qty 1

## 2011-11-23 MED ORDER — SODIUM CHLORIDE 0.9 % IV SOLN: INTRAVENOUS | Status: DC

## 2011-11-23 NOTE — Progress Notes (Signed)
ANTICOAGULATION CONSULT NOTE - Initial Consult  Pharmacy Consult for: Coumadin Indication: A.Fib  Allergies  Allergen Reactions  . Ciprofloxacin     REACTION: diarrhea/yeast infection  . Codeine Other (See Comments)    unknown  . Dilantin Other (See Comments)    unknown  . Keppra Nausea Only    Nausea developed while on this med, worsened when dose increased.  Nausea ceased when med was d/cd.  Marland Kitchen Penicillins Hives    Patient Measurements:     Vital Signs: Temp: 98.6 F (37 C) (03/01 1954) Temp src: Oral (03/01 1448) BP: 161/92 mmHg (03/01 1954) Pulse Rate: 99  (03/01 1954)  Labs:  Basename 11/23/11 1600  HGB 14.3  HCT 41.0  PLT 255  APTT --  LABPROT --  INR --  HEPARINUNFRC --  CREATININE 0.84  CKTOTAL --  CKMB --  TROPONINI --   The CrCl is unknown because both a height and weight (above a minimum accepted value) are required for this calculation.  Medical History: Past Medical History  Diagnosis Date  . Depressive disorder, not elsewhere classified   . Atrial fibrillation   . Unspecified transient cerebral ischemia   . Unspecified cerebral artery occlusion with cerebral infarction   . Unspecified essential hypertension   . Diverticulosis of colon (without mention of hemorrhage)   . Stricture and stenosis of esophagus   . Seizure disorder   . GERD (gastroesophageal reflux disease)   . IBS (irritable bowel syndrome)     Medications:  Scheduled:    . sodium chloride   Intravenous Once  . sodium chloride   Intravenous STAT  . amLODipine  10 mg Oral Daily  . aspirin EC  81 mg Oral Daily  . carbamazepine  200 mg Oral BID  . cholecalciferol  4,000 Units Oral Daily  . citalopram  20 mg Oral Daily  . conjugated estrogens  2 g Vaginal Daily  . estradiol  2 g Vaginal 3 times weekly  . fentaNYL  25 mcg Intravenous Once  . fentaNYL  25 mcg Intravenous Once  . lamoTRIgine  25 mg Oral BID  . lisinopril  40 mg Oral Daily  . magic mouthwash  15 mL Oral TID   . metoprolol  100 mg Oral BID  . nitrofurantoin (macrocrystal-monohydrate)  100 mg Oral Q12H  . nystatin  1 g Topical TID  . ondansetron  4 mg Oral QHS  . pantoprazole  40 mg Oral Daily  . potassium chloride  20 mEq Oral Once  . saccharomyces boulardii  250 mg Oral BID  . sodium chloride  3 mL Intravenous Q12H  . DISCONTD: aspirin  81 mg Oral Daily  . DISCONTD: aspirin  81 mg Oral Daily   Infusions:    . sodium chloride     PRN: barrier cream, fentaNYL, hyoscyamine, LORazepam  Assessment:  76 yo F on chronic anticoaguation with coumadin for history of A.fib  Admitted s/p fall on her knee, now with impacted R femoral neck fx to - likely surgery tomorrow.   Home dose = 4 mg on all days except for 2 mg on Monday and Friday.   Plan to hold coumadin tonight, per MD, for possible surgery in AM.   Baseline INR pending at this time  CBC okay  Goal of Therapy:  INR 2-3   Plan:  1.) No Coumadin tonight in anticipation of orthopedic surgery tomorrow.  2.) Daily PT/INR  Scharlene Catalina, Loma Messing PharmD 8:25 PM 11/23/2011

## 2011-11-23 NOTE — ED Notes (Signed)
Report given to chere, rn  

## 2011-11-23 NOTE — ED Notes (Signed)
Per EMS, pt picked up from MD/s office.  Larey Seat in the [facility] parking lot.  Pt reports R leg pain.  Hematoma noted in pt's medial anterior R knee.  Denies LOC.  Pt is on coumadin and ASA

## 2011-11-23 NOTE — Progress Notes (Signed)
ED CM noted Admission RN's CM consult indicating assistance with expected Rehab or home health d/c. ED CM spoke with the pt and Cala Bradford, pt's daughter, who confirms the preferred d/c disposition is [facility], not inpatient rehab nor home health. Daughter familiar with bed search process.

## 2011-11-23 NOTE — ED Notes (Signed)
MD at bedside. 

## 2011-11-23 NOTE — H&P (Signed)
PCP:  MCNEILL,WENDY, MD, MD   DOA:  11/23/2011  2:42 PM  Chief Complaint:  weakness  HPI: 76 year old female with multiple co morbidities including HTN, atrial fibrillation on coumadin, depression, seizure disorder who was on her way today to see her PCP but as she was getting put of the car she fell backwards and has sustained a fracture of her right femur.As per patient she does not recall any prodromal symptoms prior to fall, no chest pain, no palpitations, no lightheadedness or dizziness. No complaints of abdominal pain no nausea or vomiting. No reports of blood in stool or urine. Patient will be admitted to hospitalist service for further evaluation and management.   Assessment/Plan  Principal Problem:   *RIGHT FEMORAL NECK FRACTURE - right comminuted femoral neck fracture status post fall - follow up orthopedic recommendations - plan for surgery tomorrow at 2 pm provided INR <2.5 - as per ortho management - hold coumadin - NPO post midnight for surgery tomorrow  - unclear what the nature of this fall was so we will obtain CT heat to rule out any potential bleed or stroke - rule out cardiac arrhythmia as a cause; cycle cardiac enzymes and obtain 2 D ECHO  Active Problems:   SEIZURE DISORDER - continue lamictal and tegretol  DEPRESSION - continue celexa   HYPERTENSION - continue home medications   FIBRILLATION, ATRIAL - coumadin as per pharmacy protocol - follow up ortho and plan for surgery; perhaps hold coumadin if plan for surgery   GERD (gastroesophageal reflux disease) - continue protonix   CHRONIC DIASTOLIC CHF - EF in 07/2011 was 65% - follow up BNP level (BNP in 08/2011 was ~ 1600); consider lasix if BNP elevated - daily weight - as per medical reconciliation patient does not appear to be on lasix - follow up 2 D ECHO - cycle cardiac enzymes   LEUKOCYTOSIS - likely  Secondary to UTI - as patient was already on macrobid we will continue this antibiotic  for now and follow up urinalysis and urine culture - follow up blood cultures   HYPOKALEMIA - repleted in ED - follow up BMP in am  DISPOSITION - to telemetry   Allergies: Allergies  Allergen Reactions  . Ciprofloxacin     REACTION: diarrhea/yeast infection  . Codeine Other (See Comments)    unknown  . Dilantin Other (See Comments)    unknown  . Keppra Nausea Only    Nausea developed while on this med, worsened when dose increased.  Nausea ceased when med was d/cd.  Marland Kitchen Penicillins Hives    Prior to Admission medications   Medication Sig Start Date End Date Taking? Authorizing Provider  amLODipine (NORVASC) 10 MG tablet Take 10 mg by mouth daily.    Yes Historical Provider, MD  aspirin (ASPIRIN EC) 81 MG EC tablet Take 81 mg by mouth daily. Swallow whole.   Yes Historical Provider, MD  barrier cream (NON-SPECIFIED) CREA Apply 1 application topically 3 (three) times daily as needed. 09/04/11  Yes Leroy Sea, MD  carbamazepine (TEGRETOL) 200 MG tablet Take 200 mg by mouth 2 (two) times daily.     Yes Historical Provider, MD  cholecalciferol (VITAMIN D) 1000 UNITS tablet Take 4,000 Units by mouth daily.     Yes Historical Provider, MD  citalopram (CELEXA) 10 MG tablet Take 20 mg by mouth daily.   Yes Historical Provider, MD  conjugated estrogens (PREMARIN) vaginal cream Place 30 g vaginally daily.     Yes  Historical Provider, MD  estradiol (ESTRACE) 0.1 MG/GM vaginal cream Place 2 g vaginally 3 (three) times a week.     Yes Historical Provider, MD  hyoscyamine (LEVSIN SL) 0.125 MG SL tablet Place 0.125 mg under the tongue every 4 (four) hours as needed. For stomach cramps    Yes Historical Provider, MD  lamoTRIgine (LAMICTAL) 25 MG tablet Take 25 mg by mouth 2 (two) times daily.     Yes Historical Provider, MD  lisinopril (PRINIVIL,ZESTRIL) 40 MG tablet Take 40 mg by mouth daily.    Yes Historical Provider, MD  LORazepam (ATIVAN) 1 MG tablet Take 0.5 mg by mouth at bedtime as  needed.   Yes Historical Provider, MD  metoprolol (LOPRESSOR) 100 MG tablet Take 1 tablet (100 mg total) by mouth 2 (two) times daily. 09/04/11 09/03/12 Yes Leroy Sea, MD  nitrofurantoin, macrocrystal-monohydrate, (MACROBID) 100 MG capsule Take 100 mg by mouth 2 (two) times daily.   Yes Historical Provider, MD  ondansetron (ZOFRAN) 4 MG tablet Take 4 mg by mouth at bedtime.    Yes Historical Provider, MD  pantoprazole (PROTONIX) 40 MG tablet Take 40 mg by mouth daily.   08/01/11 07/31/12 Yes Louis Meckel, MD  saccharomyces boulardii (FLORASTOR) 250 MG capsule Take 250 mg by mouth 2 (two) times daily.     Yes Historical Provider, MD  warfarin (COUMADIN) 4 MG tablet Take 2-4 mg by mouth daily. Take 2 mg (1/2 tab) on Mon and Fri, and 4 mg on Sun, Tues, Wed, Thur, and Sat   Yes Historical Provider, MD  Alum & Mag Hydroxide-Simeth (MAGIC MOUTHWASH) SOLN Take 15 mLs by mouth 3 (three) times daily. 09/04/11   Leroy Sea, MD  nystatin (NYSTOP) 100000 UNIT/GM POWD Apply 1 g (100,000 Units total) topically 3 (three) times daily. Apply to affected back,thigh-peri rectal skin 09/04/11   Leroy Sea, MD    Past Medical History  Diagnosis Date  . Depressive disorder, not elsewhere classified   . Atrial fibrillation   . Unspecified transient cerebral ischemia   . Unspecified cerebral artery occlusion with cerebral infarction   . Unspecified essential hypertension   . Diverticulosis of colon (without mention of hemorrhage)   . Stricture and stenosis of esophagus   . Seizure disorder   . GERD (gastroesophageal reflux disease)   . IBS (irritable bowel syndrome)     Past Surgical History  Procedure Date  . Hemorrhoid surgery   . Benign braintumor     Removal  . Flexible sigmoidoscopy 09/02/2011    Procedure: FLEXIBLE SIGMOIDOSCOPY;  Surgeon: Erick Blinks, MD;  Location: [facility] ENDOSCOPY;  Service: Gastroenterology;  Laterality: N/A;    Social History:  reports that she has never smoked.  She has never used smokeless tobacco. She reports that she does not drink alcohol or use illicit drugs.  Family History  Problem Relation Age of Onset  . Heart disease Mother   . Colon cancer Neg Hx     Review of Systems:  Constitutional: Denies fever, chills, diaphoresis, appetite change and fatigue; (+) weakness.  HEENT: Denies photophobia, eye pain, redness, hearing loss, ear pain, congestion, sore throat, rhinorrhea, sneezing, mouth sores, trouble swallowing, neck pain, neck stiffness and tinnitus.   Respiratory: Denies SOB, DOE, cough, chest tightness,  and wheezing.   Cardiovascular: Denies chest pain, palpitations and leg swelling.  Gastrointestinal: Denies nausea, vomiting, abdominal pain, diarrhea, constipation, blood in stool and abdominal distention.  Genitourinary: Denies dysuria, urgency, frequency, hematuria, flank pain and difficulty  urinating.  Musculoskeletal: right hip fracture status post fall Skin: Denies pallor, rash and wound.  Neurological: Denies dizziness, seizures, syncope, weakness, light-headedness, numbness and headaches.  Hematological: Denies adenopathy. Easy bruising, personal or family bleeding history  Psychiatric/Behavioral: Denies suicidal ideation, mood changes, confusion, nervousness, sleep disturbance and agitation   Physical Exam:  Filed Vitals:   11/23/11 1448  BP: 187/84  Pulse: 84  Temp: 98.4 F (36.9 C)  TempSrc: Oral  SpO2: 93%    Constitutional:  Alert and oriented x3.  Head: Normocephalic and atraumatic Ear: TM normal bilaterally Mouth: no erythema or exudates, MMM Eyes: PERRL, EOMI, conjunctivae normal, No scleral icterus.  Neck: Supple, Trachea midline normal ROM, No JVD, mass, thyromegaly, or carotid bruit present.  Cardiovascular: RRR, S1 normal, S2 normal, no MRG, pulses symmetric and intact bilaterally Pulmonary/Chest: CTAB, no wheezes, rales, or rhonchi Abdominal: Soft. Non-tender, non-distended, bowel sounds are normal,  no masses, organomegaly, or guarding present.  GU: no CVA tenderness Musculoskeletal: No joint deformities, erythema, or stiffness, ROM full and no nontender Ext: no edema and no cyanosis, pulses palpable bilaterally (DP and PT) Hematology: no cervical, inginal, or axillary adenopathy.  Neurological: A&O x3, Strenght is normal and symmetric bilaterally, cranial nerve II-XII are grossly intact, no focal motor deficit, sensory intact to light touch bilaterally.  Skin: Warm, dry and intact. No rash, cyanosis, or clubbing.  Psychiatric: Normal mood and affect. speech and behavior is normal. Judgment and thought content normal. Cognition and memory are normal.   Labs on Admission:  Results for orders placed during the hospital encounter of 11/23/11 (from the past 48 hour(s))  CBC     Status: Abnormal   Collection Time   11/23/11  4:00 PM      Component Value Range Comment   WBC 13.5 (*) 4.0 - 10.5 (K/uL)    RBC 4.35  3.87 - 5.11 (MIL/uL)    Hemoglobin 14.3  12.0 - 15.0 (g/dL)    HCT 40.9  81.1 - 91.4 (%)    MCV 94.3  78.0 - 100.0 (fL)    Platelets 255  150 - 400 (K/uL)   COMPREHENSIVE METABOLIC PANEL     Status: Abnormal   Collection Time   11/23/11  4:00 PM      Component Value Range Comment   Sodium 140  135 - 145 (mEq/L)    Potassium 3.3 (*) 3.5 - 5.1 (mEq/L)    Chloride 100  96 - 112 (mEq/L)    CO2 32  19 - 32 (mEq/L)    Glucose, Bld 107 (*) 70 - 99 (mg/dL)    BUN 15  6 - 23 (mg/dL)    Creatinine, Ser 7.82  0.50 - 1.10 (mg/dL)    Calcium 9.8  8.4 - 10.5 (mg/dL)    Total Protein 7.4  6.0 - 8.3 (g/dL)    Albumin 4.0  3.5 - 5.2 (g/dL)    AST 17  0 - 37 (U/L)    ALT 14  0 - 35 (U/L)    Alkaline Phosphatase 79  39 - 117 (U/L)    Total Bilirubin 0.3  0.3 - 1.2 (mg/dL)    GFR calc non Af Amer 67 (*) >90 (mL/min)    GFR calc Af Amer 78 (*) >90 (mL/min)   POCT I-STAT TROPONIN I     Status: Normal   11/23/11  4:52 PM      Component Value Range Comment   Troponin i, poc 0.01  0.00 - 0.08  (ng/mL)  URINALYSIS, ROUTINE W REFLEX MICROSCOPIC     Status: Abnormal   11/23/11  5:43 PM      Component Value Range Comment   Color, Urine YELLOW  YELLOW     APPearance CLEAR  CLEAR     Specific Gravity, Urine 1.017  1.005 - 1.030     pH 7.0  5.0 - 8.0     Glucose, UA NEGATIVE  NEGATIVE (mg/dL)    Hgb urine dipstick TRACE (*) NEGATIVE     Bilirubin Urine NEGATIVE  NEGATIVE     Ketones, ur 40 (*) NEGATIVE (mg/dL)    Protein, ur NEGATIVE  NEGATIVE (mg/dL)    Urobilinogen, UA 0.2  0.0 - 1.0 (mg/dL)    Nitrite NEGATIVE  NEGATIVE     Leukocytes, UA NEGATIVE  NEGATIVE    URINE MICROSCOPIC-ADD ON     Status: Normal   11/23/11  5:43 PM   RBC / HPF 0-2  <3 (RBC/hpf)      Time Spent on Admission: Greater than 45 minutes  Nataly Pacifico 11/23/2011, 7:11 PM

## 2011-11-23 NOTE — ED Provider Notes (Signed)
History     CSN: [id number]  Arrival date & time 11/23/11  1429   First MD Initiated Contact with Patient 11/23/11 1506      Chief Complaint  Patient presents with  . Leg Pain  . Fall    (Consider location/radiation/quality/duration/timing/severity/associated sxs/prior treatment) Patient is a 76 y.o. female presenting with leg pain and fall. The history is provided by a relative (Pt's son (pt has aphasia and is unable to give hx)).  Leg Pain   Fall Pertinent negatives include no abdominal pain, no nausea and no vomiting.   76 year old female with past medical history of CHF, atrial fibrillation, hypertension, CVA with resultant aphasia presents after a fall. She apparently was at her physician's office earlier today. She was getting out of the car to enter the office when she lost her footing and fell backward. During her fall, she sustained an injury to her right knee. She did not strike her head or lose consciousness. She has noticed bruising to the area without any obvious swelling or deformity. She denies having any pain in her bilateral hips. She did not strike her head or lose consciousness. She was very briefly evaluated by her PCP and instructed to come to the ED for further evaluation, as she was unable to bear wt on the leg.  Her son is at bedside to provide hx. He states that she has been weak since she was hospitalized in Nov for C diff. States that she was sent to a rehab facility after discharge and returned home in mid-January.  Past Medical History  Diagnosis Date  . Depressive disorder, not elsewhere classified   . Atrial fibrillation   . Unspecified transient cerebral ischemia   . Unspecified cerebral artery occlusion with cerebral infarction   . Unspecified essential hypertension   . Diverticulosis of colon (without mention of hemorrhage)   . Stricture and stenosis of esophagus   . Seizure disorder   . GERD (gastroesophageal reflux disease)   . IBS (irritable  bowel syndrome)     Past Surgical History  Procedure Date  . Hemorrhoid surgery   . Benign braintumor     Removal  . Flexible sigmoidoscopy 09/02/2011    Procedure: FLEXIBLE SIGMOIDOSCOPY;  Surgeon: Erick Blinks, MD;  Location: [facility] ENDOSCOPY;  Service: Gastroenterology;  Laterality: N/A;    Family History  Problem Relation Age of Onset  . Heart disease Mother   . Colon cancer Neg Hx     History  Substance Use Topics  . Smoking status: Never Smoker   . Smokeless tobacco: Never Used  . Alcohol Use: No    OB History    Grav Para Term Preterm Abortions TAB SAB Ect Mult Living                  Review of Systems  Constitutional: Negative.   HENT: Negative for congestion, sore throat, facial swelling, rhinorrhea and neck pain.   Eyes: Negative for visual disturbance.  Respiratory: Negative for chest tightness and shortness of breath.   Cardiovascular: Negative for chest pain and palpitations.  Gastrointestinal: Negative for nausea, vomiting and abdominal pain.  Genitourinary: Negative for urgency and difficulty urinating.  Musculoskeletal: Positive for arthralgias and gait problem. Negative for joint swelling.  Skin: Negative for color change and rash.  Neurological: Negative for dizziness and weakness.    Allergies  Ciprofloxacin; Codeine; Dilantin; Keppra; and Penicillins  Home Medications   Current Outpatient Rx  Name Route Sig Dispense Refill  . AMLODIPINE BESYLATE  10 MG PO TABS Oral Take 10 mg by mouth daily.     . ASPIRIN 81 MG PO TBEC Oral Take 81 mg by mouth daily. Swallow whole.    Marland Kitchen BARRIER CREAM NON-SPECIFIED Topical Apply 1 application topically 3 (three) times daily as needed. 1 each 1  . CARBAMAZEPINE 200 MG PO TABS Oral Take 200 mg by mouth 2 (two) times daily.      Marland Kitchen VITAMIN D 1000 UNITS PO TABS Oral Take 4,000 Units by mouth daily.      Marland Kitchen CITALOPRAM HYDROBROMIDE 10 MG PO TABS Oral Take 20 mg by mouth daily.    Marland Kitchen ESTROGENS, CONJUGATED 0.625 MG/GM VA CREA  Vaginal Place 30 g vaginally daily.      Marland Kitchen ESTRADIOL 0.1 MG/GM VA CREA Vaginal Place 2 g vaginally 3 (three) times a week.      Marland Kitchen HYOSCYAMINE SULFATE 0.125 MG SL SUBL Sublingual Place 0.125 mg under the tongue every 4 (four) hours as needed. For stomach cramps     . LAMOTRIGINE 25 MG PO TABS Oral Take 25 mg by mouth 2 (two) times daily.      Marland Kitchen LISINOPRIL 40 MG PO TABS Oral Take 40 mg by mouth daily.     Marland Kitchen LORAZEPAM 1 MG PO TABS Oral Take 0.5 mg by mouth at bedtime as needed.    Marland Kitchen METOPROLOL TARTRATE 100 MG PO TABS Oral Take 1 tablet (100 mg total) by mouth 2 (two) times daily. 1 tablet 1  . NITROFURANTOIN MONOHYD MACRO 100 MG PO CAPS Oral Take 100 mg by mouth 2 (two) times daily.    Marland Kitchen ONDANSETRON HCL 4 MG PO TABS Oral Take 4 mg by mouth at bedtime.     Marland Kitchen PANTOPRAZOLE SODIUM 40 MG PO TBEC Oral Take 40 mg by mouth daily.      Marland Kitchen SACCHAROMYCES BOULARDII 250 MG PO CAPS Oral Take 250 mg by mouth 2 (two) times daily.      . WARFARIN SODIUM 4 MG PO TABS Oral Take 2-4 mg by mouth daily. Take 2 mg (1/2 tab) on Mon and Fri, and 4 mg on Sun, Tues, Wed, Thur, and Sat    . MAGIC MOUTHWASH Oral Take 15 mLs by mouth 3 (three) times daily. 15 mL 1  . NYSTOP 100000 UNIT/GM EX POWD Topical Apply 1 g (100,000 Units total) topically 3 (three) times daily. Apply to affected back,thigh-peri rectal skin 1 Bottle 1    BP 187/84  Pulse 84  Temp(Src) 98.4 F (36.9 C) (Oral)  SpO2 93%  Physical Exam  Nursing note and vitals reviewed. Constitutional: She appears well-developed and well-nourished. No distress.       Frail  HENT:  Head: Normocephalic and atraumatic.  Eyes: EOM are normal. Pupils are equal, round, and reactive to light.  Neck: Normal range of motion.  Cardiovascular: Normal rate, regular rhythm and normal heart sounds.   Pulmonary/Chest: Effort normal and breath sounds normal. She has no wheezes. She exhibits no tenderness.  Abdominal: Soft. Bowel sounds are normal. There is no tenderness. There is  no rebound.  Musculoskeletal:       R knee with ecchymosis to anterior knee. No focal bony tenderness, edema, or crepitus appreciated. Ligaments appear grossly intact. No gross jt effusion; patella is not ballottable. Pt with pain with passive ROM.  Hips, pelvis nontender bilaterally; pelvis stable. There is no obvious shortening or rotation. LEs neurovasc intact b/l with DP/PT pulses intact.  Neurological: She is alert. She displays normal  reflexes. No cranial nerve deficit. Coordination normal.       Pt largely aphasic. Able to give yes/no answers and state brief phrases. Son states this is normal for her.  Skin: Skin is warm and dry. No rash noted. She is not diaphoretic.  Psychiatric: She has a normal mood and affect.    ED Course  Procedures (including critical care time)   Date: 11/23/2011  Rate: 93  Rhythm: atrial fibrillation  QRS Axis: normal  Intervals:   ST/T Wave abnormalities: minimal ST depression diffusely  Conduction Disutrbances:  Narrative Interpretation:   Old EKG Reviewed: 08/29/2011 ECG shows a-fib with RVR with rate of 147 bpm  Labs Reviewed  CBC - Abnormal; Notable for the following:    WBC 13.5 (*)    All other components within normal limits  DIFFERENTIAL - Abnormal; Notable for the following:    Neutrophils Relative 84 (*)    Neutro Abs 11.4 (*)    Lymphocytes Relative 8 (*)    All other components within normal limits  COMPREHENSIVE METABOLIC PANEL - Abnormal; Notable for the following:    Potassium 3.3 (*)    Glucose, Bld 107 (*)    GFR calc non Af Amer 67 (*)    GFR calc Af Amer 78 (*)    All other components within normal limits  POCT I-STAT TROPONIN I  URINALYSIS, ROUTINE W REFLEX MICROSCOPIC   Dg Chest 1 View  11/23/2011  *RADIOLOGY REPORT*  Clinical Data: Larey Seat.  Right hip pain, deformity.  Syncope.  CHEST - 1 VIEW  Comparison: 08/29/2011  Findings: The heart is enlarged. No pulmonary edema.  No focal consolidations or pleural effusions.  No  pneumothorax or evidence for acute fracture.  IMPRESSION:  1.  Cardiomegaly. 2. No evidence for acute cardiopulmonary abnormality.  Original Report Authenticated By: Patterson Hammersmith, M.D.   Dg Hip Bilateral Vito Berger  11/23/2011  *RADIOLOGY REPORT*  Clinical Data: Larey Seat.  Right hip pain and deformity.  BILATERAL HIP WITH PELVIS - 4+ VIEW  Comparison: CT 08/21/2011  Findings: There is a comminuted fracture of the right femoral neck, associated with varus angulation and impaction.  The femoral head is located within the acetabulum.  Views of the left proximal femur show no evidence for fracture, dislocation.  Regional bowel gas pattern is nonobstructive. There are mild degenerative changes in the lower lumbar spine.  IMPRESSION: Comminuted fracture of the right femoral neck, associated with varus angulation and impaction.  Original Report Authenticated By: Patterson Hammersmith, M.D.   Dg Knee 1-2 Views Right  11/23/2011  *RADIOLOGY REPORT*  Clinical Data: Larey Seat.  Right hip pain and deformity.  RIGHT KNEE - 1-2 VIEW  Comparison:  None  Findings:  Bones appear radiolucent.  There is no evidence for acute fracture or dislocation.  Mild degenerate changes identified in the medial and patellofemoral compartments.  No joint effusion. Note is made of popliteal artery calcification.  IMPRESSION:  No evidence for acute . abnormality. .  Original Report Authenticated By: Patterson Hammersmith, M.D.   I personally reviewed the plain films.   1. Closed right hip fracture       MDM  Pt presents s/p fall today. Per XR, she has a comminuted, impacted R femoral neck fx. Her labs are generally unremarkable. Given her multiple medical comorbidities, plan to admit to IM for medical management with orthopaedic surgery consultation.  6:00 PM I have discussed with Dr. Elisabeth Pigeon of [facility]. Pt to be admitted to telemetry for  management. Holding orders placed.  6:24 PM Dr. Rubin Payor has discussed with Dr. Luiz Blare of  orthopaedic surgery. He will follow. Requests that we hold her Coumadin; to surgery likely tomorrow afternoon. NPO after midnight.     Grant Fontana, [place] 11/23/11 1827

## 2011-11-23 NOTE — Consult Note (Signed)
Reason for Consult:r. Hip fracture Referring Physician: hospitalists  [name] is an 76 y.o. female.  HPI: 76 yo female fell at her doctors office today.  Pt suffered r. Femoral neck fracture and we are consulted for management.  Past Medical History  Diagnosis Date  . Depressive disorder, not elsewhere classified   . Atrial fibrillation   . Unspecified transient cerebral ischemia   . Unspecified cerebral artery occlusion with cerebral infarction   . Unspecified essential hypertension   . Diverticulosis of colon (without mention of hemorrhage)   . Stricture and stenosis of esophagus   . Seizure disorder   . GERD (gastroesophageal reflux disease)   . IBS (irritable bowel syndrome)     Past Surgical History  Procedure Date  . Hemorrhoid surgery   . Benign braintumor     Removal  . Flexible sigmoidoscopy 09/02/2011    Procedure: FLEXIBLE SIGMOIDOSCOPY;  Surgeon: Erick Blinks, MD;  Location: [facility] ENDOSCOPY;  Service: Gastroenterology;  Laterality: N/A;    Family History  Problem Relation Age of Onset  . Heart disease Mother   . Colon cancer Neg Hx     Social History:  reports that she has never smoked. She has never used smokeless tobacco. She reports that she does not drink alcohol or use illicit drugs.  Allergies:  Allergies  Allergen Reactions  . Ciprofloxacin     REACTION: diarrhea/yeast infection  . Codeine Other (See Comments)    unknown  . Dilantin Other (See Comments)    unknown  . Keppra Nausea Only    Nausea developed while on this med, worsened when dose increased.  Nausea ceased when med was d/cd.  Marland Kitchen Penicillins Hives    Medications: I have reviewed the patient's current medications.  Results for orders placed during the hospital encounter of 11/23/11 (from the past 48 hour(s))  CBC     Status: Abnormal   Collection Time   11/23/11  4:00 PM      Component Value Range Comment   WBC 13.5 (*) 4.0 - 10.5 (K/uL)    RBC 4.35  3.87 - 5.11 (MIL/uL)    Hemoglobin 14.3  12.0 - 15.0 (g/dL)    HCT 16.1  09.6 - 04.5 (%)    MCV 94.3  78.0 - 100.0 (fL)    MCH 32.9  26.0 - 34.0 (pg)    MCHC 34.9  30.0 - 36.0 (g/dL)    RDW 40.9  81.1 - 91.4 (%)    Platelets 255  150 - 400 (K/uL)   DIFFERENTIAL     Status: Abnormal   Collection Time   11/23/11  4:00 PM      Component Value Range Comment   Neutrophils Relative 84 (*) 43 - 77 (%)    Neutro Abs 11.4 (*) 1.7 - 7.7 (K/uL)    Lymphocytes Relative 8 (*) 12 - 46 (%)    Lymphs Abs 1.1  0.7 - 4.0 (K/uL)    Monocytes Relative 7  3 - 12 (%)    Monocytes Absolute 1.0  0.1 - 1.0 (K/uL)    Eosinophils Relative 0  0 - 5 (%)    Eosinophils Absolute 0.1  0.0 - 0.7 (K/uL)    Basophils Relative 0  0 - 1 (%)    Basophils Absolute 0.0  0.0 - 0.1 (K/uL)   COMPREHENSIVE METABOLIC PANEL     Status: Abnormal   Collection Time   11/23/11  4:00 PM      Component Value Range Comment  Sodium 140  135 - 145 (mEq/L)    Potassium 3.3 (*) 3.5 - 5.1 (mEq/L)    Chloride 100  96 - 112 (mEq/L)    CO2 32  19 - 32 (mEq/L)    Glucose, Bld 107 (*) 70 - 99 (mg/dL)    BUN 15  6 - 23 (mg/dL)    Creatinine, Ser 1.61  0.50 - 1.10 (mg/dL)    Calcium 9.8  8.4 - 10.5 (mg/dL)    Total Protein 7.4  6.0 - 8.3 (g/dL)    Albumin 4.0  3.5 - 5.2 (g/dL)    AST 17  0 - 37 (U/L)    ALT 14  0 - 35 (U/L)    Alkaline Phosphatase 79  39 - 117 (U/L)    Total Bilirubin 0.3  0.3 - 1.2 (mg/dL)    GFR calc non Af Amer 67 (*) >90 (mL/min)    GFR calc Af Amer 78 (*) >90 (mL/min)   POCT I-STAT TROPONIN I     Status: Normal   Collection Time   11/23/11  4:52 PM      Component Value Range Comment   Troponin i, poc 0.01  0.00 - 0.08 (ng/mL)    Comment 3            URINALYSIS, ROUTINE W REFLEX MICROSCOPIC     Status: Abnormal   Collection Time   11/23/11  5:43 PM      Component Value Range Comment   Color, Urine YELLOW  YELLOW     APPearance CLEAR  CLEAR     Specific Gravity, Urine 1.017  1.005 - 1.030     pH 7.0  5.0 - 8.0     Glucose, UA NEGATIVE   NEGATIVE (mg/dL)    Hgb urine dipstick TRACE (*) NEGATIVE     Bilirubin Urine NEGATIVE  NEGATIVE     Ketones, ur 40 (*) NEGATIVE (mg/dL)    Protein, ur NEGATIVE  NEGATIVE (mg/dL)    Urobilinogen, UA 0.2  0.0 - 1.0 (mg/dL)    Nitrite NEGATIVE  NEGATIVE     Leukocytes, UA NEGATIVE  NEGATIVE    URINE MICROSCOPIC-ADD ON     Status: Normal   Collection Time   11/23/11  5:43 PM      Component Value Range Comment   RBC / HPF 0-2  <3 (RBC/hpf)     Dg Chest 1 View  11/23/2011  *RADIOLOGY REPORT*  Clinical Data: Larey Seat.  Right hip pain, deformity.  Syncope.  CHEST - 1 VIEW  Comparison: 08/29/2011  Findings: The heart is enlarged. No pulmonary edema.  No focal consolidations or pleural effusions.  No pneumothorax or evidence for acute fracture.  IMPRESSION:  1.  Cardiomegaly. 2. No evidence for acute cardiopulmonary abnormality.  Original Report Authenticated By: Patterson Hammersmith, M.D.   Dg Hip Bilateral Vito Berger  11/23/2011  *RADIOLOGY REPORT*  Clinical Data: Larey Seat.  Right hip pain and deformity.  BILATERAL HIP WITH PELVIS - 4+ VIEW  Comparison: CT 08/21/2011  Findings: There is a comminuted fracture of the right femoral neck, associated with varus angulation and impaction.  The femoral head is located within the acetabulum.  Views of the left proximal femur show no evidence for fracture, dislocation.  Regional bowel gas pattern is nonobstructive. There are mild degenerative changes in the lower lumbar spine.  IMPRESSION: Comminuted fracture of the right femoral neck, associated with varus angulation and impaction.  Original Report Authenticated By: Patterson Hammersmith, M.D.   Dg  Knee 1-2 Views Right  11/23/2011  *RADIOLOGY REPORT*  Clinical Data: Larey Seat.  Right hip pain and deformity.  RIGHT KNEE - 1-2 VIEW  Comparison:  None  Findings:  Bones appear radiolucent.  There is no evidence for acute fracture or dislocation.  Mild degenerate changes identified in the medial and patellofemoral compartments.  No joint  effusion. Note is made of popliteal artery calcification.  IMPRESSION:  No evidence for acute . abnormality. .  Original Report Authenticated By: Patterson Hammersmith, M.D.   Ct Head Wo Contrast  11/23/2011  *RADIOLOGY REPORT*  Clinical Data: Fall.  History of stroke.  CT HEAD WITHOUT CONTRAST  Technique:  Contiguous axial images were obtained from the base of the skull through the vertex without contrast.  Comparison: 08/27/2011  Findings: There is significant central cortical atrophy. Periventricular white matter changes are moderate to severe and appears stable.  Left porencephalic change again identified, associated with prior left frontal craniotomy.  There is no evidence for hemorrhage, mass lesion, or acute infarction.  No evidence for acute calvarial fracture.  Visualized paranasal sinuses and mastoid air cells are clear.  IMPRESSION:  1.  Left frontal craniotomy changes associated porencephaly. 2.  Atrophy and small vessel disease. 3. No evidence for acute intracranial abnormality.  Original Report Authenticated By: Patterson Hammersmith, M.D.    ROS: reviewed and no positive responses as relates to hpi EXAM: Blood pressure 161/92, pulse 99, temperature 98.6 F (37 C), temperature source Oral, resp. rate 24, SpO2 95.00%. wdwn female ned aphasic from stroke Eyes not dilated Not using accessory muscles of respiration Alert and oriented times three R. Leg externally rotated and shortened  Assessment/Plan: 76 yo female with r. Fem neck fracture with mult med problems including a-fib for which she takes coumadin.  Her INR was 3.2 at her docs office today.  She needs hemi-hip but INR to elevated to do surgery today.  i recommed SCD's for le and doing surgery when INR at 2.5 or below.  We will follow but tentaively have her on for tomorrow at 2pm if INR lowers and will do this surgery Sunday if Inr stays elevated.  No need for bucks traction.  [name] L 11/23/2011, 8:14 PM

## 2011-11-23 NOTE — ED Provider Notes (Signed)
Medical screening examination/treatment/procedure(s) were performed by non-physician practitioner and as supervising physician I was immediately available for consultation/collaboration.  Terree Gaultney R. Tonna Palazzi, MD 11/23/11 2337 

## 2011-11-24 ENCOUNTER — Encounter (HOSPITAL_COMMUNITY)
Admission: EM | Disposition: A | Discharge status: Skilled Nursing Facility | Payer: Self-pay | Source: Home / Self Care | Attending: Internal Medicine

## 2011-11-24 ENCOUNTER — Inpatient Hospital Stay (HOSPITAL_COMMUNITY): Payer: Medicare Other

## 2011-11-24 DIAGNOSIS — I5032 Chronic diastolic (congestive) heart failure: Secondary | ICD-10-CM | POA: Diagnosis not present

## 2011-11-24 DIAGNOSIS — I359 Nonrheumatic aortic valve disorder, unspecified: Secondary | ICD-10-CM

## 2011-11-24 LAB — TSH: TSH: 0.927 u[IU]/mL (ref 0.350–4.500)

## 2011-11-24 LAB — HEMOGLOBIN A1C
Hgb A1c MFr Bld: 5.7 % — ABNORMAL HIGH (ref <5.7)
Mean Plasma Glucose: 117 mg/dL — ABNORMAL HIGH (ref <117)

## 2011-11-24 LAB — ABO/RH: ABO/RH(D): A POS

## 2011-11-24 LAB — CARDIAC PANEL(CRET KIN+CKTOT+MB+TROPI)
CK, MB: 2.4 ng/mL (ref 0.3–4.0)
Total CK: 183 U/L — ABNORMAL HIGH (ref 7–177)

## 2011-11-24 LAB — PROTIME-INR: Prothrombin Time: 31.1 seconds — ABNORMAL HIGH (ref 11.6–15.2)

## 2011-11-24 LAB — COMPREHENSIVE METABOLIC PANEL
AST: 14 U/L (ref 0–37)
Albumin: 3.1 g/dL — ABNORMAL LOW (ref 3.5–5.2)
CO2: 28 mEq/L (ref 19–32)
Calcium: 8.9 mg/dL (ref 8.4–10.5)
Creatinine, Ser: 0.76 mg/dL (ref 0.50–1.10)
GFR calc non Af Amer: 82 mL/min — ABNORMAL LOW (ref >90)

## 2011-11-24 LAB — CBC
Hemoglobin: 12.1 g/dL (ref 12.0–15.0)
MCH: 33.3 pg (ref 26.0–34.0)
RBC: 3.63 MIL/uL — ABNORMAL LOW (ref 3.87–5.11)

## 2011-11-24 SURGERY — HEMIARTHROPLASTY, HIP, DIRECT ANTERIOR APPROACH, FOR FRACTURE
Anesthesia: Choice | Laterality: Right

## 2011-11-24 MED ORDER — VITAMIN K1 10 MG/ML IJ SOLN
5.0000 mg | Freq: Once | INTRAVENOUS | Status: AC
  Administered 2011-11-24: 5 mg via INTRAVENOUS
  Filled 2011-11-24: qty 0.5

## 2011-11-24 MED ORDER — FUROSEMIDE 10 MG/ML IJ SOLN
60.0000 mg | Freq: Once | INTRAMUSCULAR | Status: AC
  Administered 2011-11-24: 60 mg via INTRAVENOUS
  Filled 2011-11-24: qty 6

## 2011-11-24 MED ORDER — ALBUTEROL SULFATE (5 MG/ML) 0.5% IN NEBU
INHALATION_SOLUTION | RESPIRATORY_TRACT | Status: AC
  Administered 2011-11-24: 2.5 mg
  Filled 2011-11-24: qty 0.5

## 2011-11-24 MED ORDER — FUROSEMIDE 10 MG/ML IJ SOLN
20.0000 mg | Freq: Two times a day (BID) | INTRAMUSCULAR | Status: DC
  Administered 2011-11-24 – 2011-11-25 (×2): 20 mg via INTRAVENOUS
  Filled 2011-11-24 (×3): qty 2

## 2011-11-24 MED ORDER — MUPIROCIN 2 % EX OINT: 1.0000 "application " | TOPICAL_OINTMENT | Freq: Two times a day (BID) | CUTANEOUS | Status: DC

## 2011-11-24 MED ORDER — ONDANSETRON HCL 4 MG/2ML IJ SOLN
4.0000 mg | Freq: Four times a day (QID) | INTRAMUSCULAR | Status: DC | PRN
  Administered 2011-11-24: 4 mg via INTRAVENOUS
  Filled 2011-11-24: qty 2

## 2011-11-24 MED ORDER — CHLORHEXIDINE GLUCONATE CLOTH 2 % EX PADS: 6.0000 | MEDICATED_PAD | Freq: Every day | CUTANEOUS | Status: DC

## 2011-11-24 SURGICAL SUPPLY — 38 items
BAG ZIPLOCK 12X15 (MISCELLANEOUS) IMPLANT
BLADE SAW SAG 73X25 THK (BLADE)
BLADE SAW SGTL 73X25 THK (BLADE) IMPLANT
CLOTH BEACON ORANGE TIMEOUT ST (SAFETY) IMPLANT
COVER SURGICAL LIGHT HANDLE (MISCELLANEOUS) IMPLANT
DRAPE INCISE IOBAN 66X45 STRL (DRAPES) IMPLANT
DRAPE ORTHO SPLIT 77X108 STRL (DRAPES)
DRAPE POUCH INSTRU U-SHP 10X18 (DRAPES) IMPLANT
DRAPE SURG ORHT 6 SPLT 77X108 (DRAPES) IMPLANT
DRSG MEPILEX BORDER 4X8 (GAUZE/BANDAGES/DRESSINGS) IMPLANT
DURAPREP 26ML APPLICATOR (WOUND CARE) IMPLANT
ELECT REM PT RETURN 9FT ADLT (ELECTROSURGICAL)
ELECTRODE REM PT RTRN 9FT ADLT (ELECTROSURGICAL) IMPLANT
EVACUATOR 1/8 PVC DRAIN (DRAIN) IMPLANT
FACESHIELD LNG OPTICON STERILE (SAFETY) IMPLANT
GLOVE BIO SURGEON STRL SZ8 (GLOVE) IMPLANT
GLOVE ECLIPSE 7.5 STRL STRAW (GLOVE) IMPLANT
IMMOBILIZER KNEE 20 (SOFTGOODS)
IMMOBILIZER KNEE 20 THIGH 36 (SOFTGOODS) IMPLANT
MANIFOLD NEPTUNE II (INSTRUMENTS) IMPLANT
NEEDLE HYPO 22GX1.5 SAFETY (NEEDLE) IMPLANT
NS IRRIG 1000ML POUR BTL (IV SOLUTION) IMPLANT
PACK TOTAL JOINT (CUSTOM PROCEDURE TRAY) IMPLANT
PASSER SUT SWANSON 36MM LOOP (INSTRUMENTS) IMPLANT
POSITIONER SURGICAL ARM (MISCELLANEOUS) IMPLANT
STAPLER VISISTAT 35W (STAPLE) IMPLANT
SUCTION FRAZIER TIP 10 FR DISP (SUCTIONS) IMPLANT
SUT ETHIBOND NAB CT1 #1 30IN (SUTURE) IMPLANT
SUT VIC AB 0 CTX 27 (SUTURE) IMPLANT
SUT VIC AB 1 CTX 36 (SUTURE)
SUT VIC AB 1 CTX36XBRD ANBCTR (SUTURE) IMPLANT
SUT VIC AB 2-0 CT1 27 (SUTURE)
SUT VIC AB 2-0 CT1 TAPERPNT 27 (SUTURE) IMPLANT
SYR CONTROL 10ML LL (SYRINGE) IMPLANT
TOWEL OR 17X26 10 PK STRL BLUE (TOWEL DISPOSABLE) IMPLANT
TOWER CARTRIDGE SMART MIX (DISPOSABLE) IMPLANT
TRAY FOLEY CATH 14FRSI W/METER (CATHETERS) IMPLANT
WATER STERILE IRR 1500ML POUR (IV SOLUTION) IMPLANT

## 2011-11-24 NOTE — Progress Notes (Signed)
Subjective: Some dry heaving this am. Surgery has been delayed until tomorrow given INR.  Objective: Vital signs in last 24 hours: Temp:  [97.9 F (36.6 C)-98.6 F (37 C)] 97.9 F (36.6 C) (03/02 0526) Pulse Rate:  [84-113] 105  (03/02 0526) Resp:  [18-24] 18  (03/02 0526) BP: (161-187)/(77-92) 171/77 mmHg (03/02 0526) SpO2:  [91 %-100 %] 100 % (03/02 0526) Weight change:     Intake/Output from previous day: 03/01 0701 - 03/02 0700 In: 348 [I.V.:348] Out: 500 [Urine:500]     Physical Exam: General: Alert, awake, oriented x3. HEENT: No bruits, no goiter. Heart: Regular rate and rhythm, without murmurs, rubs, gallops. Lungs: Clear to auscultation bilaterally. Abdomen: Soft, nontender, nondistended, positive bowel sounds. Extremities: No clubbing cyanosis or edema with positive pedal pulses.    Lab Results: Basic Metabolic Panel:  Basename 11/24/11 0421 11/23/11 2024  NA 137 139  K 3.1* 3.2*  CL 102 100  CO2 28 28  GLUCOSE 129* 124*  BUN 14 14  CREATININE 0.76 0.76  CALCIUM 8.9 9.1  MG -- 1.4*  PHOS -- 2.6   Liver Function Tests:  Basename 11/24/11 0421 11/23/11 2024  AST 14 16  ALT 11 13  ALKPHOS 63 71  BILITOT 0.3 0.3  PROT 6.0 6.8  ALBUMIN 3.1* 3.5   CBC:  Basename 11/24/11 0421 11/23/11 2024 11/23/11 1600  WBC 10.9* 14.7* --  NEUTROABS -- 12.7* 11.4*  HGB 12.1 13.1 --  HCT 34.2* 37.4 --  MCV 94.2 93.7 --  PLT 217 268 --   Cardiac Enzymes:  Basename 11/24/11 0421 11/23/11 2020  CKTOTAL 183* 80  CKMB 2.4 2.6  CKMBINDEX -- --  TROPONINI <0.30 <0.30   BNP:  Basename 11/23/11 2043  PROBNP 794.2*   CBG:  Basename 11/24/11 0742  GLUCAP 116*   Hemoglobin A1C:  Basename 11/23/11 2024  HGBA1C 5.7*   Thyroid Function Tests:  Basename 11/23/11 2024  TSH 0.927  T4TOTAL --  FREET4 --  T3FREE --  THYROIDAB --   Coagulation:  Basename 11/24/11 0421 11/23/11 2024  LABPROT 31.1* 29.3*  INR 2.94* 2.72*   Urinalysis:  Basename  11/23/11 1743  COLORURINE YELLOW  LABSPEC 1.017  PHURINE 7.0  GLUCOSEU NEGATIVE  HGBUR TRACE*  BILIRUBINUR NEGATIVE  KETONESUR 40*  PROTEINUR NEGATIVE  UROBILINOGEN 0.2  NITRITE NEGATIVE  LEUKOCYTESUR NEGATIVE    Recent Results (from the past 240 hour(s))  MRSA PCR SCREENING     Status: Abnormal   Collection Time   11/24/11  6:39 AM      Component Value Range Status Comment   MRSA by PCR POSITIVE (*) NEGATIVE  Final     Studies/Results: Dg Chest 1 View  11/23/2011  *RADIOLOGY REPORT*  Clinical Data: Larey Seat.  Right hip pain, deformity.  Syncope.  CHEST - 1 VIEW  Comparison: 08/29/2011  Findings: The heart is enlarged. No pulmonary edema.  No focal consolidations or pleural effusions.  No pneumothorax or evidence for acute fracture.  IMPRESSION:  1.  Cardiomegaly. 2. No evidence for acute cardiopulmonary abnormality.  Original Report Authenticated By: Patterson Hammersmith, M.D.   Dg Hip Bilateral Vito Berger  11/23/2011  *RADIOLOGY REPORT*  Clinical Data: Larey Seat.  Right hip pain and deformity.  BILATERAL HIP WITH PELVIS - 4+ VIEW  Comparison: CT 08/21/2011  Findings: There is a comminuted fracture of the right femoral neck, associated with varus angulation and impaction.  The femoral head is located within the acetabulum.  Views of the left proximal femur  show no evidence for fracture, dislocation.  Regional bowel gas pattern is nonobstructive. There are mild degenerative changes in the lower lumbar spine.  IMPRESSION: Comminuted fracture of the right femoral neck, associated with varus angulation and impaction.  Original Report Authenticated By: Patterson Hammersmith, M.D.   Dg Knee 1-2 Views Right  11/23/2011  *RADIOLOGY REPORT*  Clinical Data: Larey Seat.  Right hip pain and deformity.  RIGHT KNEE - 1-2 VIEW  Comparison:  None  Findings:  Bones appear radiolucent.  There is no evidence for acute fracture or dislocation.  Mild degenerate changes identified in the medial and patellofemoral compartments.  No  joint effusion. Note is made of popliteal artery calcification.  IMPRESSION:  No evidence for acute . abnormality. .  Original Report Authenticated By: Patterson Hammersmith, M.D.   Ct Head Wo Contrast  11/23/2011  *RADIOLOGY REPORT*  Clinical Data: Fall.  History of stroke.  CT HEAD WITHOUT CONTRAST  Technique:  Contiguous axial images were obtained from the base of the skull through the vertex without contrast.  Comparison: 08/27/2011  Findings: There is significant central cortical atrophy. Periventricular white matter changes are moderate to severe and appears stable.  Left porencephalic change again identified, associated with prior left frontal craniotomy.  There is no evidence for hemorrhage, mass lesion, or acute infarction.  No evidence for acute calvarial fracture.  Visualized paranasal sinuses and mastoid air cells are clear.  IMPRESSION:  1.  Left frontal craniotomy changes associated porencephaly. 2.  Atrophy and small vessel disease. 3. No evidence for acute intracranial abnormality.  Original Report Authenticated By: Patterson Hammersmith, M.D.    Medications: Scheduled Meds:   . sodium chloride   Intravenous Once  . sodium chloride   Intravenous STAT  . amLODipine  10 mg Oral Daily  . aspirin EC  81 mg Oral Daily  . carbamazepine  200 mg Oral BID  . Chlorhexidine Gluconate Cloth  6 each Topical Q0600  . cholecalciferol  4,000 Units Oral Daily  . citalopram  20 mg Oral Daily  . conjugated estrogens  2 g Vaginal Daily  . estradiol  2 g Vaginal 3 times weekly  . fentaNYL  25 mcg Intravenous Once  . fentaNYL  25 mcg Intravenous Once  . lamoTRIgine  25 mg Oral BID  . lisinopril  40 mg Oral Daily  . magic mouthwash  15 mL Oral TID  . metoprolol  100 mg Oral BID  . nitrofurantoin (macrocrystal-monohydrate)  100 mg Oral Q12H  . nystatin  1 g Topical TID  . ondansetron  4 mg Oral QHS  . pantoprazole  40 mg Oral Daily  . phytonadione (VITAMIN K) IV  5 mg Intravenous Once  . potassium  chloride  20 mEq Oral Once  . saccharomyces boulardii  250 mg Oral BID  . sodium chloride  3 mL Intravenous Q12H  . DISCONTD: aspirin  81 mg Oral Daily  . DISCONTD: aspirin  81 mg Oral Daily  . DISCONTD: mupirocin ointment  1 application Nasal BID  . DISCONTD: Warfarin - Pharmacist Dosing Inpatient   Does not apply q1800  . DISCONTD: Warfarin - Pharmacist Dosing Inpatient   Does not apply q1800   Continuous Infusions:   . sodium chloride 50 mL/hr at 11/24/11 1058   PRN Meds:.barrier cream, fentaNYL, hyoscyamine, LORazepam  Assessment/Plan:  Principal Problem:  *Femoral neck fracture Active Problems:  DEPRESSION  HYPERTENSION  FIBRILLATION, ATRIAL  CEREBROVASCULAR ACCIDENT  GERD (gastroesophageal reflux disease)  Diastolic CHF, acute  Leukocytosis  Hypokalemia   #1 Right Hip Fracture: Per ortho. Repair scheduled for tomorrow.  #2 Coagulopathy 2/2 coumadin use: To receive FFP/vit K today to reverse INR.  Rest of chronic medical issues are stable.   LOS: 1 day   [facility] Pager: [phone] 11/24/2011, 11:19 AM

## 2011-11-24 NOTE — Progress Notes (Signed)
  Echocardiogram 2D Echocardiogram has been performed.  [name] Nira Retort 11/24/2011, 10:02 AM

## 2011-11-24 NOTE — Progress Notes (Signed)
I have called Dr. Ardyth Harps earlier regarding this pt's. Condition, and have just done so again regarding her low spo2.  Her condition has somewhat improved, as she is not as short of breath as she was a couple of hours ago.  She is currently in no distress, and answers all questions in regard to her situation appropriately; and specifically denies any pain.  We receive orders at this time.

## 2011-11-24 NOTE — Progress Notes (Signed)
Speech Language/Pathology  Order received for swallow eval, pt now receiving sips and medications d/t scheduled for surgery tomorrow.  SLP to follow up on Monday 11/26/11 am for evalaution.  Thanks.        Donavan Burnet, MS [facility] SLP       [phone]

## 2011-11-24 NOTE — Progress Notes (Signed)
Subjective: C/o of some nausea.  Pt INR too high to consider surgery today.   Objective: Vital signs in last 24 hours: Temp:  [97.9 F (36.6 C)-98.6 F (37 C)] 98.1 F (36.7 C) (03/02 1145) Pulse Rate:  [84-113] 103  (03/02 1145) Resp:  [18-24] 20  (03/02 1145) BP: (160-187)/(77-92) 160/82 mmHg (03/02 1145) SpO2:  [91 %-100 %] 100 % (03/02 0526)  Intake/Output from previous day: 03/01 0701 - 03/02 0700 In: 348 [I.V.:348] Out: 500 [Urine:500] Intake/Output this shift: Total I/O In: 588 [I.V.:250; Blood:338] Out: -    Basename 11/24/11 0421 11/23/11 2024 11/23/11 1600  HGB 12.1 13.1 14.3    Basename 11/24/11 0421 11/23/11 2024  WBC 10.9* 14.7*  RBC 3.63* 3.99  HCT 34.2* 37.4  PLT 217 268    Basename 11/24/11 0421 11/23/11 2024  NA 137 139  K 3.1* 3.2*  CL 102 100  CO2 28 28  BUN 14 14  CREATININE 0.76 0.76  GLUCOSE 129* 124*  CALCIUM 8.9 9.1    Basename 11/24/11 0421 11/23/11 2024  LABPT -- --  INR 2.94* 2.72*    ABD soft Intact pulses distally Dorsiflexion/Plantar flexion intact No cellulitis present mild pain on ROM. r. hip  Assessment/Plan: Persistent elevation of INR in face of needing r. Hemi-arthroplasty.  Will cancel surgery for today but would like to get surgery done tomorrow if possible.  i have spoken with hospitalist and feel that using FFP and VitK may be necessary to combat the effects of coumadin to allow for surgery tomorrow.  On schedule for 10 AM to allow for time to get a handle on inr in AM    Jaber Dunlow L 11/24/2011, 12:37 PM

## 2011-11-24 NOTE — Progress Notes (Signed)
PT NOTE:  This pt currently is on strict bedrest secondary to femoral neck fx with possible surgery this pm.  PT services d/c at this time - please reorder post-op as appropriate.

## 2011-11-24 NOTE — Progress Notes (Signed)
Was called earlier today by Tim, RN regarding change in patient's condition. Patient was found on routine check to have an oxygen sat of around 70% on room air. Apparently asymptomatic and not SOB. CXR ordered STAT and shows bilateral airspace disease most likely representing pulmonary edema. Review of 2D ECHO from 11/12 shows chronic diastolic CHF with moderate concentric hypertrophy and an EF of 65-70%. She was receiving NS @ 75 cc/hr. Fluids locked off, was given 60mg  IV lasix and has already had significant urine output with increasing oxygen sats. She is now satting low 90s on 100% NRB. Orders given for strict Is and Os and lasix 20 mg IV BID. Will continue to follow. Depending on progression overnight, will decide whether it is safe to proceed with surgery in the am to repair her hip.

## 2011-11-24 NOTE — Progress Notes (Signed)
ANTICOAGULATION CONSULT NOTE - Follow Up Consult  Pharmacy Consult for: Warfarin Indication: A.Fib  Allergies  Allergen Reactions  . Ciprofloxacin     REACTION: diarrhea/yeast infection  . Codeine Other (See Comments)    unknown  . Dilantin Other (See Comments)    unknown  . Keppra Nausea Only    Nausea developed while on this med, worsened when dose increased.  Nausea ceased when med was d/cd.  Marland Kitchen Penicillins Hives   Vital Signs: Temp: 97.9 F (36.6 C) (03/02 0526) Temp src: Oral (03/02 0526) BP: 171/77 mmHg (03/02 0526) Pulse Rate: 105  (03/02 0526)  Labs:  Basename 11/24/11 0421 11/23/11 2024 11/23/11 2020 11/23/11 1600  HGB 12.1 13.1 -- --  HCT 34.2* 37.4 -- 41.0  PLT 217 268 -- 255  APTT 42* 39* -- --  LABPROT 31.1* 29.3* -- --  INR 2.94* 2.72* -- --  HEPARINUNFRC -- -- -- --  CREATININE 0.76 0.76 -- 0.84  CKTOTAL 183* -- 80 --  CKMB 2.4 -- 2.6 --  TROPONINI <0.30 -- <0.30 --   The CrCl is unknown because both a height and weight (above a minimum accepted value) are required for this calculation.  Medical History: Past Medical History  Diagnosis Date  . Depressive disorder, not elsewhere classified   . Atrial fibrillation   . Unspecified transient cerebral ischemia   . Unspecified cerebral artery occlusion with cerebral infarction   . Unspecified essential hypertension   . Diverticulosis of colon (without mention of hemorrhage)   . Stricture and stenosis of esophagus   . Seizure disorder   . GERD (gastroesophageal reflux disease)   . IBS (irritable bowel syndrome)     Medications:  Scheduled:     . sodium chloride   Intravenous Once  . sodium chloride   Intravenous STAT  . amLODipine  10 mg Oral Daily  . aspirin EC  81 mg Oral Daily  . carbamazepine  200 mg Oral BID  . Chlorhexidine Gluconate Cloth  6 each Topical Q0600  . cholecalciferol  4,000 Units Oral Daily  . citalopram  20 mg Oral Daily  . conjugated estrogens  2 g Vaginal Daily  .  estradiol  2 g Vaginal 3 times weekly  . fentaNYL  25 mcg Intravenous Once  . fentaNYL  25 mcg Intravenous Once  . lamoTRIgine  25 mg Oral BID  . lisinopril  40 mg Oral Daily  . magic mouthwash  15 mL Oral TID  . metoprolol  100 mg Oral BID  . nitrofurantoin (macrocrystal-monohydrate)  100 mg Oral Q12H  . nystatin  1 g Topical TID  . ondansetron  4 mg Oral QHS  . pantoprazole  40 mg Oral Daily  . potassium chloride  20 mEq Oral Once  . saccharomyces boulardii  250 mg Oral BID  . sodium chloride  3 mL Intravenous Q12H  . DISCONTD: aspirin  81 mg Oral Daily  . DISCONTD: aspirin  81 mg Oral Daily  . DISCONTD: mupirocin ointment  1 application Nasal BID  . DISCONTD: Warfarin - Pharmacist Dosing Inpatient   Does not apply q1800  . DISCONTD: Warfarin - Pharmacist Dosing Inpatient   Does not apply q1800    Warfarin PTA dose 4 mg on all days except for 2 mg on Monday and Friday.  Last dose on 3/1 Inpatient warfarin: none  Assessment:  76 yo F on chronic anticoaguation with warfarin for history of A.fib  Admitted s/p fall on her knee, now with impacted  R femoral neck fxt  INR is therapeutic and increased, despite holding (last dose 3/1)  Plan to hold warfarin, per MD surgery to be scheduled when INR <2.5   No bleeding reported  Goal of Therapy:  INR 2-3   Plan:  1.) Continue to hold warfarin 2.) Daily PT/INR   Lynann Beaver PharmD   Pager [phone] 11/24/2011 11:05 AM

## 2011-11-25 ENCOUNTER — Encounter (HOSPITAL_COMMUNITY)
Admission: EM | Disposition: A | Discharge status: Skilled Nursing Facility | Payer: Self-pay | Source: Home / Self Care | Attending: Internal Medicine

## 2011-11-25 LAB — PREPARE FRESH FROZEN PLASMA: Unit division: 0

## 2011-11-25 LAB — CBC
HCT: 34.9 % — ABNORMAL LOW (ref 36.0–46.0)
MCH: 32.3 pg (ref 26.0–34.0)
MCV: 94.8 fL (ref 78.0–100.0)
Platelets: 185 10*3/uL (ref 150–400)
RBC: 3.68 MIL/uL — ABNORMAL LOW (ref 3.87–5.11)
RDW: 13.7 % (ref 11.5–15.5)
WBC: 11 10*3/uL — ABNORMAL HIGH (ref 4.0–10.5)

## 2011-11-25 LAB — BASIC METABOLIC PANEL
CO2: 31 mEq/L (ref 19–32)
Calcium: 9.1 mg/dL (ref 8.4–10.5)
Chloride: 102 mEq/L (ref 96–112)
Creatinine, Ser: 1.09 mg/dL (ref 0.50–1.10)
Glucose, Bld: 123 mg/dL — ABNORMAL HIGH (ref 70–99)

## 2011-11-25 LAB — GLUCOSE, CAPILLARY
Glucose-Capillary: 126 mg/dL — ABNORMAL HIGH (ref 70–99)
Glucose-Capillary: 118 mg/dL — ABNORMAL HIGH (ref 70–99)

## 2011-11-25 LAB — MAGNESIUM: Magnesium: 1.5 mg/dL (ref 1.5–2.5)

## 2011-11-25 SURGERY — HEMIARTHROPLASTY, HIP, DIRECT ANTERIOR APPROACH, FOR FRACTURE
Anesthesia: Choice | Laterality: Right

## 2011-11-25 MED ORDER — FUROSEMIDE 10 MG/ML IJ SOLN
40.0000 mg | Freq: Two times a day (BID) | INTRAMUSCULAR | Status: DC
  Administered 2011-11-25 – 2011-11-27 (×4): 40 mg via INTRAVENOUS
  Filled 2011-11-25 (×7): qty 4

## 2011-11-25 MED ORDER — POTASSIUM CHLORIDE 10 MEQ/100ML IV SOLN
10.0000 meq | INTRAVENOUS | Status: AC
  Administered 2011-11-25 (×4): 10 meq via INTRAVENOUS
  Filled 2011-11-25 (×4): qty 100

## 2011-11-25 MED ORDER — FUROSEMIDE 10 MG/ML IJ SOLN
20.0000 mg | Freq: Once | INTRAMUSCULAR | Status: AC
  Administered 2011-11-25: 20 mg via INTRAVENOUS
  Filled 2011-11-25: qty 2

## 2011-11-25 NOTE — Progress Notes (Signed)
I have spoken with Dr. Ardyth Harps this morning, who had me attempt to titrate her o2 down (see doc flow sheets).  She has examined the pt., and is in the process of contacting Dr. Luiz Blare.  Pt. Has been in, and remains in no distress.  She is minimally short of breath; and her skin is slightly pale, warm and dry.  She has no c/o and specifically denies any pain.  She is quite relaxed in appearance.  Dr. Ardyth Harps is aware of her lab results, including her potassium.

## 2011-11-25 NOTE — Progress Notes (Signed)
ANTICOAGULATION CONSULT NOTE - Follow Up Consult  Pharmacy Consult for: Warfarin Indication: A.Fib  Labs:  Basename 11/25/11 0610 11/24/11 1600 11/24/11 0421 11/23/11 2024 11/23/11 2020  HGB 11.9* -- 12.1 -- --  HCT 34.9* -- 34.2* 37.4 --  PLT 185 -- 217 268 --  APTT -- -- 42* 39* --  LABPROT 15.4* 19.0* 31.1* -- --  INR 1.19 1.56* 2.94* -- --  HEPARINUNFRC -- -- -- -- --  CREATININE 1.09 -- 0.76 0.76 --  CKTOTAL -- -- 183* -- 80  CKMB -- -- 2.4 -- 2.6  TROPONINI -- -- <0.30 -- <0.30    Medications:  Scheduled:     . albuterol      . amLODipine  10 mg Oral Daily  . aspirin EC  81 mg Oral Daily  . carbamazepine  200 mg Oral BID  . Chlorhexidine Gluconate Cloth  6 each Topical Q0600  . cholecalciferol  4,000 Units Oral Daily  . citalopram  20 mg Oral Daily  . conjugated estrogens  2 g Vaginal Daily  . estradiol  2 g Vaginal 3 times weekly  . furosemide  20 mg Intravenous Once  . furosemide  40 mg Intravenous BID  . furosemide  60 mg Intravenous Once  . lamoTRIgine  25 mg Oral BID  . lisinopril  40 mg Oral Daily  . magic mouthwash  15 mL Oral TID  . metoprolol  100 mg Oral BID  . nitrofurantoin (macrocrystal-monohydrate)  100 mg Oral Q12H  . nystatin  1 g Topical TID  . ondansetron  4 mg Oral QHS  . pantoprazole  40 mg Oral Daily  . phytonadione (VITAMIN K) IV  5 mg Intravenous Once  . potassium chloride  10 mEq Intravenous Q1 Hr x 4  . saccharomyces boulardii  250 mg Oral BID  . sodium chloride  3 mL Intravenous Q12H  . DISCONTD: sodium chloride   Intravenous STAT  . DISCONTD: furosemide  20 mg Intravenous BID    Warfarin PTA dose 4 mg on all days except for 2 mg on Monday and Friday.  Last dose on 3/1 Inpatient warfarin: none  Assessment:  76 yo F on chronic anticoaguation with warfarin for history of A.fib  Admitted s/p fall on her knee, now with impacted R femoral neck fxt  Holding warfarin due to need for Hemi-arthroplasty  INR is subtherapeutic s/p  Vitamin K 5mg  IV x1 dose on 3/2  Goal of Therapy:  INR 2-3   Plan:  1.) Continue to hold warfarin 2.) Daily PT/INR   Lynann Beaver PharmD   Pager [phone] 11/25/2011 11:08 AM

## 2011-11-25 NOTE — Progress Notes (Addendum)
Subjective: No complaints, specifically no SOB,CP.  Objective: Vital signs in last 24 hours: Temp:  [98 F (36.7 C)-100.3 F (37.9 C)] 99.3 F (37.4 C) (03/03 0618) Pulse Rate:  [93-109] 93  (03/03 0618) Resp:  [18-26] 20  (03/03 0618) BP: (141-175)/(64-87) 175/75 mmHg (03/03 0618) SpO2:  [70 %-99 %] 90 % (03/03 0740) FiO2 (%):  [40 %-100 %] 50 % (03/03 0740) Weight change:     Intake/Output from previous day: 03/02 0701 - 03/03 0700 In: 588 [I.V.:250; Blood:338] Out: 1000 [Urine:1000]     Physical Exam: General: Alert, awake, oriented x3, in no acute distress. HEENT: No bruits, no goiter. Heart: Regular rate and rhythm, without murmurs, rubs, gallops. Lungs: Bilateral crackles Abdomen: Soft, nontender, nondistended, positive bowel sounds. Extremities: No clubbing cyanosis or edema with positive pedal pulses. Neuro: Grossly intact, nonfocal.    Lab Results: Basic Metabolic Panel:  Basename 11/25/11 0610 11/24/11 0421 11/23/11 2024  NA 141 137 --  K 2.9* 3.1* --  CL 102 102 --  CO2 31 28 --  GLUCOSE 123* 129* --  BUN 19 14 --  CREATININE 1.09 0.76 --  CALCIUM 9.1 8.9 --  MG -- -- 1.4*  PHOS -- -- 2.6   Liver Function Tests:  Basename 11/24/11 0421 11/23/11 2024  AST 14 16  ALT 11 13  ALKPHOS 63 71  BILITOT 0.3 0.3  PROT 6.0 6.8  ALBUMIN 3.1* 3.5   CBC:  Basename 11/25/11 0610 11/24/11 0421 11/23/11 2024 11/23/11 1600  WBC 11.0* 10.9* -- --  NEUTROABS -- -- 12.7* 11.4*  HGB 11.9* 12.1 -- --  HCT 34.9* 34.2* -- --  MCV 94.8 94.2 -- --  PLT 185 217 -- --   Cardiac Enzymes:  Basename 11/24/11 0421 11/23/11 2020  CKTOTAL 183* 80  CKMB 2.4 2.6  CKMBINDEX -- --  TROPONINI <0.30 <0.30   BNP:  Basename 11/23/11 2043  PROBNP 794.2*   CBG:  Basename 11/25/11 0747 11/25/11 0638 11/24/11 0742  GLUCAP 118* 126* 116*   Hemoglobin A1C:  Basename 11/23/11 2024  HGBA1C 5.7*   Thyroid Function Tests:  Basename 11/23/11 2024  TSH 0.927    T4TOTAL --  FREET4 --  T3FREE --  THYROIDAB --   Coagulation:  Basename 11/25/11 0610 11/24/11 1600  LABPROT 15.4* 19.0*  INR 1.19 1.56*   Urinalysis:  Basename 11/23/11 1743  COLORURINE YELLOW  LABSPEC 1.017  PHURINE 7.0  GLUCOSEU NEGATIVE  HGBUR TRACE*  BILIRUBINUR NEGATIVE  KETONESUR 40*  PROTEINUR NEGATIVE  UROBILINOGEN 0.2  NITRITE NEGATIVE  LEUKOCYTESUR NEGATIVE    Recent Results (from the past 240 hour(s))  URINE CULTURE     Status: Normal (Preliminary result)   Collection Time   11/23/11  7:35 PM      Component Value Range Status Comment   Specimen Description URINE, CATHETERIZED   Final    Special Requests NONE   Final    Culture  Setup Time [id number]   Final    Colony Count PENDING   Incomplete    Culture Culture reincubated for better growth   Final    Report Status PENDING   Incomplete   MRSA PCR SCREENING     Status: Abnormal   Collection Time   11/24/11  6:39 AM      Component Value Range Status Comment   MRSA by PCR POSITIVE (*) NEGATIVE  Final     Studies/Results: Dg Chest 1 View  11/23/2011  *RADIOLOGY REPORT*  Clinical Data: Larey Seat.  Right hip pain, deformity.  Syncope.  CHEST - 1 VIEW  Comparison: 08/29/2011  Findings: The heart is enlarged. No pulmonary edema.  No focal consolidations or pleural effusions.  No pneumothorax or evidence for acute fracture.  IMPRESSION:  1.  Cardiomegaly. 2. No evidence for acute cardiopulmonary abnormality.  Original Report Authenticated By: Patterson Hammersmith, M.D.   Dg Hip Bilateral Vito Berger  11/23/2011  *RADIOLOGY REPORT*  Clinical Data: Larey Seat.  Right hip pain and deformity.  BILATERAL HIP WITH PELVIS - 4+ VIEW  Comparison: CT 08/21/2011  Findings: There is a comminuted fracture of the right femoral neck, associated with varus angulation and impaction.  The femoral head is located within the acetabulum.  Views of the left proximal femur show no evidence for fracture, dislocation.  Regional bowel gas pattern is  nonobstructive. There are mild degenerative changes in the lower lumbar spine.  IMPRESSION: Comminuted fracture of the right femoral neck, associated with varus angulation and impaction.  Original Report Authenticated By: Patterson Hammersmith, M.D.   Dg Knee 1-2 Views Right  11/23/2011  *RADIOLOGY REPORT*  Clinical Data: Larey Seat.  Right hip pain and deformity.  RIGHT KNEE - 1-2 VIEW  Comparison:  None  Findings:  Bones appear radiolucent.  There is no evidence for acute fracture or dislocation.  Mild degenerate changes identified in the medial and patellofemoral compartments.  No joint effusion. Note is made of popliteal artery calcification.  IMPRESSION:  No evidence for acute . abnormality. .  Original Report Authenticated By: Patterson Hammersmith, M.D.   Ct Head Wo Contrast  11/23/2011  *RADIOLOGY REPORT*  Clinical Data: Fall.  History of stroke.  CT HEAD WITHOUT CONTRAST  Technique:  Contiguous axial images were obtained from the base of the skull through the vertex without contrast.  Comparison: 08/27/2011  Findings: There is significant central cortical atrophy. Periventricular white matter changes are moderate to severe and appears stable.  Left porencephalic change again identified, associated with prior left frontal craniotomy.  There is no evidence for hemorrhage, mass lesion, or acute infarction.  No evidence for acute calvarial fracture.  Visualized paranasal sinuses and mastoid air cells are clear.  IMPRESSION:  1.  Left frontal craniotomy changes associated porencephaly. 2.  Atrophy and small vessel disease. 3. No evidence for acute intracranial abnormality.  Original Report Authenticated By: Patterson Hammersmith, M.D.   Dg Chest Port 1 View  11/24/2011  *RADIOLOGY REPORT*  Clinical Data: Hypoxia  PORTABLE CHEST - 1 VIEW  Comparison: 11/23/2011  Findings: Interval development of extensive bibasilar airspace disease.  This may be due to pneumonia or edema.  Heart size mildly enlarged.  Possible vascular  congestion.  No significant effusion.  IMPRESSION: Interval development of diffuse bilateral airspace disease most likely due to pulmonary edema however pneumonia could have this appearance.  Original Report Authenticated By: Camelia Phenes, M.D.    Medications: Scheduled Meds:   . albuterol      . amLODipine  10 mg Oral Daily  . aspirin EC  81 mg Oral Daily  . carbamazepine  200 mg Oral BID  . Chlorhexidine Gluconate Cloth  6 each Topical Q0600  . cholecalciferol  4,000 Units Oral Daily  . citalopram  20 mg Oral Daily  . conjugated estrogens  2 g Vaginal Daily  . estradiol  2 g Vaginal 3 times weekly  . furosemide  20 mg Intravenous BID  . furosemide  60 mg Intravenous Once  . lamoTRIgine  25 mg Oral BID  .  lisinopril  40 mg Oral Daily  . magic mouthwash  15 mL Oral TID  . metoprolol  100 mg Oral BID  . nitrofurantoin (macrocrystal-monohydrate)  100 mg Oral Q12H  . nystatin  1 g Topical TID  . ondansetron  4 mg Oral QHS  . pantoprazole  40 mg Oral Daily  . phytonadione (VITAMIN K) IV  5 mg Intravenous Once  . saccharomyces boulardii  250 mg Oral BID  . sodium chloride  3 mL Intravenous Q12H  . DISCONTD: sodium chloride   Intravenous STAT   Continuous Infusions:   . DISCONTD: sodium chloride 50 mL/hr at 11/24/11 1058   PRN Meds:.barrier cream, fentaNYL, hyoscyamine, LORazepam, ondansetron (ZOFRAN) IV  Assessment/Plan:  Principal Problem:  *Femoral neck fracture Active Problems:  Diastolic CHF, acute on chronic  DEPRESSION  HYPERTENSION  FIBRILLATION, ATRIAL  CEREBROVASCULAR ACCIDENT  GERD (gastroesophageal reflux disease)  Diastolic CHF, acute  Leukocytosis  Hypokalemia   #1 Femoral Neck Fracture: Will need repair. Dr. Luiz Blare following. Would advise deferral of OR today (see below).  #2 Acute on Chronic Diastolic CHF: ECHO shows EF 65-70% with concentric hypertrophy. Became hypoxemic yesterday pm and a CXR showed new bilateral airspace disease likely representing  pulmonary edema. Will continue with diuresis today. Likely received too much volume with IVF and FFP. Continue strict Is and Os.  #3 afib: rate controlled. INR reversed for surgery.  #4 Hypokalemia: secondary to diuresis. Replete and check magnesium.  #5 Dispo: Would advise holding off on surgery this am to give [place] time to further diurese her and optimize her respiratory status. Discussed with Dr. Luiz Blare.   LOS: 2 days   [facility] Pager: [phone] 11/25/2011, 8:29 AM

## 2011-11-25 NOTE — Progress Notes (Signed)
Subjective: Pt awake and alert today.  Currently on 5l [place] but looks good   Objective: Vital signs in last 24 hours: Temp:  [98 F (36.7 C)-100.3 F (37.9 C)] 99.3 F (37.4 C) (03/03 0618) Pulse Rate:  [93-109] 93  (03/03 0618) Resp:  [18-26] 20  (03/03 0618) BP: (141-175)/(64-87) 175/75 mmHg (03/03 0618) SpO2:  [70 %-99 %] 90 % (03/03 0740) FiO2 (%):  [40 %-100 %] 50 % (03/03 0740)  Intake/Output from previous day: 03/02 0701 - 03/03 0700 In: 588 [I.V.:250; Blood:338] Out: 1000 [Urine:1000] Intake/Output this shift:     Basename 11/25/11 0610 11/24/11 0421 11/23/11 2024 11/23/11 1600  HGB 11.9* 12.1 13.1 14.3    Basename 11/25/11 0610 11/24/11 0421  WBC 11.0* 10.9*  RBC 3.68* 3.63*  HCT 34.9* 34.2*  PLT 185 217    Basename 11/25/11 0610 11/24/11 0421  NA 141 137  K 2.9* 3.1*  CL 102 102  CO2 31 28  BUN 19 14  CREATININE 1.09 0.76  GLUCOSE 123* 129*  CALCIUM 9.1 8.9    Basename 11/25/11 0610 11/24/11 1600  LABPT -- --  INR 1.19 1.56*   WDWN NAD EYES: not dilated Not using accessory muscles of respiration Alert and oriented Neurologically intact ABD soft Intact pulses distally No cellulitis present Compartment soft r. lower ext. mild pain on ROM  Assessment/Plan: 76 yo female with R. Fem neck fx. Needing hemi-arthroplasty.  hosp coarse complicated by high INR treated with FFP and Vit K.  Pt went into mod pulmonary edema treated with lassix and developed low K+ currently treated with K+ runs.  I spoke with hospitalists and left message with patients daughter, but i think most reasonable coarse is to hold surgery for today and plan for surgery tomorrow.  We have her on schedule for 3pm tomorrow.   [name] 11/25/2011, 11:02 AM

## 2011-11-26 ENCOUNTER — Inpatient Hospital Stay (HOSPITAL_COMMUNITY): Payer: Medicare Other

## 2011-11-26 ENCOUNTER — Encounter (HOSPITAL_COMMUNITY)
Admission: EM | Disposition: A | Discharge status: Skilled Nursing Facility | Payer: Self-pay | Source: Home / Self Care | Attending: Internal Medicine

## 2011-11-26 ENCOUNTER — Encounter (HOSPITAL_COMMUNITY): Payer: Self-pay | Admitting: Anesthesiology

## 2011-11-26 ENCOUNTER — Inpatient Hospital Stay (HOSPITAL_COMMUNITY): Payer: Medicare Other | Admitting: Anesthesiology

## 2011-11-26 DIAGNOSIS — S72001A Fracture of unspecified part of neck of right femur, initial encounter for closed fracture: Secondary | ICD-10-CM

## 2011-11-26 HISTORY — PX: HIP ARTHROPLASTY: SHX981

## 2011-11-26 LAB — BASIC METABOLIC PANEL
BUN: 23 mg/dL (ref 6–23)
Calcium: 9 mg/dL (ref 8.4–10.5)
Chloride: 95 mEq/L — ABNORMAL LOW (ref 96–112)
Creatinine, Ser: 0.99 mg/dL (ref 0.50–1.10)
GFR calc Af Amer: 64 mL/min — ABNORMAL LOW (ref >90)
GFR calc non Af Amer: 55 mL/min — ABNORMAL LOW (ref >90)

## 2011-11-26 LAB — URINE CULTURE
Colony Count: 15000
Culture  Setup Time: 201303020045

## 2011-11-26 LAB — TYPE AND SCREEN
ABO/RH(D): A POS
Antibody Screen: NEGATIVE

## 2011-11-26 LAB — PROTIME-INR
INR: 1.07 (ref 0.00–1.49)
Prothrombin Time: 14.1 seconds (ref 11.6–15.2)

## 2011-11-26 LAB — GLUCOSE, CAPILLARY: Glucose-Capillary: 114 mg/dL — ABNORMAL HIGH (ref 70–99)

## 2011-11-26 SURGERY — HEMIARTHROPLASTY, HIP, DIRECT ANTERIOR APPROACH, FOR FRACTURE
Anesthesia: General | Site: Hip | Laterality: Right | Wound class: Clean

## 2011-11-26 MED ORDER — SODIUM CHLORIDE 0.9 % IV SOLN
INTRAVENOUS | Status: DC | PRN
  Administered 2011-11-26: 16:00:00 via INTRAVENOUS

## 2011-11-26 MED ORDER — POTASSIUM CHLORIDE 10 MEQ/100ML IV SOLN
10.0000 meq | INTRAVENOUS | Status: DC
  Administered 2011-11-26 (×4): 10 meq via INTRAVENOUS
  Filled 2011-11-26 (×5): qty 100

## 2011-11-26 MED ORDER — FERROUS SULFATE 325 (65 FE) MG PO TABS
325.0000 mg | ORAL_TABLET | Freq: Two times a day (BID) | ORAL | Status: DC
  Administered 2011-11-27 – 2011-11-30 (×7): 325 mg via ORAL
  Filled 2011-11-26 (×8): qty 1

## 2011-11-26 MED ORDER — GLYCOPYRROLATE 0.2 MG/ML IJ SOLN
INTRAMUSCULAR | Status: DC | PRN
  Administered 2011-11-26 (×2): 0.2 mg via INTRAVENOUS

## 2011-11-26 MED ORDER — EPHEDRINE SULFATE 50 MG/ML IJ SOLN
INTRAMUSCULAR | Status: DC | PRN
  Administered 2011-11-26 (×3): 5 mg via INTRAVENOUS

## 2011-11-26 MED ORDER — NEOSTIGMINE METHYLSULFATE 1 MG/ML IJ SOLN
INTRAMUSCULAR | Status: DC | PRN
  Administered 2011-11-26: 3 mg via INTRAVENOUS

## 2011-11-26 MED ORDER — ONDANSETRON HCL 4 MG/2ML IJ SOLN
4.0000 mg | Freq: Four times a day (QID) | INTRAMUSCULAR | Status: DC | PRN
  Administered 2011-11-28: 4 mg via INTRAVENOUS
  Filled 2011-11-26: qty 2

## 2011-11-26 MED ORDER — PROPOFOL 10 MG/ML IV BOLUS
INTRAVENOUS | Status: DC | PRN
  Administered 2011-11-26: 90 mg via INTRAVENOUS

## 2011-11-26 MED ORDER — LABETALOL HCL 5 MG/ML IV SOLN
20.0000 mg | Freq: Once | INTRAVENOUS | Status: DC
  Filled 2011-11-26: qty 4

## 2011-11-26 MED ORDER — MAGNESIUM SULFATE 40 MG/ML IJ SOLN
2.0000 g | Freq: Once | INTRAMUSCULAR | Status: AC
  Administered 2011-11-26: 2 g via INTRAVENOUS
  Filled 2011-11-26: qty 50

## 2011-11-26 MED ORDER — AMLODIPINE BESYLATE 10 MG PO TABS
10.0000 mg | ORAL_TABLET | Freq: Two times a day (BID) | ORAL | Status: DC
  Administered 2011-11-26 – 2011-11-29 (×6): 10 mg via ORAL
  Filled 2011-11-26 (×8): qty 1

## 2011-11-26 MED ORDER — CHLORHEXIDINE GLUCONATE CLOTH 2 % EX PADS
6.0000 | MEDICATED_PAD | Freq: Every day | CUTANEOUS | Status: DC
  Administered 2011-11-27 – 2011-11-30 (×3): 6 via TOPICAL

## 2011-11-26 MED ORDER — VANCOMYCIN HCL 1000 MG IV SOLR
1000.0000 mg | INTRAVENOUS | Status: DC | PRN
  Administered 2011-11-26: 1000 mg via INTRAVENOUS

## 2011-11-26 MED ORDER — ROCURONIUM BROMIDE 100 MG/10ML IV SOLN
INTRAVENOUS | Status: DC | PRN
  Administered 2011-11-26: 40 mg via INTRAVENOUS

## 2011-11-26 MED ORDER — POTASSIUM CHLORIDE 10 MEQ/100ML IV SOLN
INTRAVENOUS | Status: DC | PRN
  Administered 2011-11-26: 10 meq via INTRAVENOUS

## 2011-11-26 MED ORDER — WARFARIN - PHARMACIST DOSING INPATIENT
Freq: Every day | Status: DC
  Filled 2011-11-26 (×5): qty 1

## 2011-11-26 MED ORDER — ONDANSETRON HCL 4 MG PO TABS: 4.0000 mg | ORAL_TABLET | Freq: Four times a day (QID) | ORAL | Status: DC | PRN

## 2011-11-26 MED ORDER — WARFARIN SODIUM 5 MG PO TABS
5.0000 mg | ORAL_TABLET | Freq: Once | ORAL | Status: AC
  Administered 2011-11-26: 5 mg via ORAL
  Filled 2011-11-26: qty 1

## 2011-11-26 MED ORDER — FENTANYL CITRATE 0.05 MG/ML IJ SOLN
INTRAMUSCULAR | Status: DC | PRN
  Administered 2011-11-26: 25 ug via INTRAVENOUS
  Administered 2011-11-26: 50 ug via INTRAVENOUS
  Administered 2011-11-26: 25 ug via INTRAVENOUS
  Administered 2011-11-26 (×2): 50 ug via INTRAVENOUS

## 2011-11-26 MED ORDER — FUROSEMIDE 10 MG/ML IJ SOLN: 20.0000 mg | Freq: Once | INTRAMUSCULAR | Status: DC

## 2011-11-26 MED ORDER — DOCUSATE SODIUM 100 MG PO CAPS
100.0000 mg | ORAL_CAPSULE | Freq: Two times a day (BID) | ORAL | Status: DC
  Administered 2011-11-27 – 2011-11-30 (×7): 100 mg via ORAL
  Filled 2011-11-26 (×9): qty 1

## 2011-11-26 MED ORDER — CLINDAMYCIN PHOSPHATE 600 MG/50ML IV SOLN
600.0000 mg | Freq: Four times a day (QID) | INTRAVENOUS | Status: AC
Start: 2011-11-26 — End: 2011-11-27
  Administered 2011-11-26 – 2011-11-27 (×3): 600 mg via INTRAVENOUS
  Filled 2011-11-26 (×3): qty 50

## 2011-11-26 MED ORDER — LIDOCAINE HCL (CARDIAC) 20 MG/ML IV SOLN
INTRAVENOUS | Status: DC | PRN
  Administered 2011-11-26: 40 mg via INTRAVENOUS

## 2011-11-26 MED ORDER — BUPIVACAINE HCL 0.5 % IJ SOLN
INTRAMUSCULAR | Status: DC | PRN
  Administered 2011-11-26: 20 mL

## 2011-11-26 MED ORDER — PROMETHAZINE HCL 25 MG/ML IJ SOLN: 6.2500 mg | INTRAMUSCULAR | Status: DC | PRN

## 2011-11-26 MED ORDER — ACETAMINOPHEN 325 MG PO TABS: 650.0000 mg | ORAL_TABLET | Freq: Four times a day (QID) | ORAL | Status: DC | PRN

## 2011-11-26 MED ORDER — ALBUTEROL SULFATE (5 MG/ML) 0.5% IN NEBU
2.5000 mg | INHALATION_SOLUTION | Freq: Once | RESPIRATORY_TRACT | Status: AC
  Administered 2011-11-26: 2.5 mg via RESPIRATORY_TRACT

## 2011-11-26 MED ORDER — ACETAMINOPHEN 10 MG/ML IV SOLN
INTRAVENOUS | Status: DC | PRN
  Administered 2011-11-26: 1000 mg via INTRAVENOUS

## 2011-11-26 MED ORDER — SODIUM CHLORIDE 0.9 % IV SOLN
INTRAVENOUS | Status: DC
  Administered 2011-11-26: 10 mL/h via INTRAVENOUS
  Administered 2011-11-28: 10:00:00 via INTRAVENOUS

## 2011-11-26 MED ORDER — MUPIROCIN 2 % EX OINT
1.0000 "application " | TOPICAL_OINTMENT | Freq: Two times a day (BID) | CUTANEOUS | Status: DC
  Administered 2011-11-26 – 2011-11-30 (×9): 1 via NASAL
  Filled 2011-11-26: qty 22

## 2011-11-26 MED ORDER — ACETAMINOPHEN 650 MG RE SUPP: 650.0000 mg | Freq: Four times a day (QID) | RECTAL | Status: DC | PRN

## 2011-11-26 MED ORDER — ESMOLOL HCL 10 MG/ML IV SOLN
INTRAVENOUS | Status: DC | PRN
  Administered 2011-11-26 (×2): 10 mg via INTRAVENOUS
  Administered 2011-11-26: 40 mg via INTRAVENOUS
  Administered 2011-11-26 (×2): 20 mg via INTRAVENOUS

## 2011-11-26 MED ORDER — FENTANYL CITRATE 0.05 MG/ML IJ SOLN: 25.0000 ug | INTRAMUSCULAR | Status: DC | PRN

## 2011-11-26 MED ORDER — LACTATED RINGERS IV SOLN: INTRAVENOUS | Status: DC

## 2011-11-26 MED ORDER — DEXAMETHASONE SODIUM PHOSPHATE 10 MG/ML IJ SOLN
INTRAMUSCULAR | Status: DC | PRN
  Administered 2011-11-26: 10 mg via INTRAVENOUS

## 2011-11-26 MED ORDER — ONDANSETRON HCL 4 MG/2ML IJ SOLN
INTRAMUSCULAR | Status: DC | PRN
  Administered 2011-11-26: 4 mg via INTRAVENOUS

## 2011-11-26 MED ORDER — LACTATED RINGERS IV SOLN
INTRAVENOUS | Status: DC | PRN
  Administered 2011-11-26 (×2): via INTRAVENOUS

## 2011-11-26 MED ORDER — PHENYLEPHRINE HCL 10 MG/ML IJ SOLN
INTRAMUSCULAR | Status: DC | PRN
  Administered 2011-11-26 (×2): 80 ug via INTRAVENOUS
  Administered 2011-11-26 (×4): 40 ug via INTRAVENOUS
  Administered 2011-11-26 (×2): 80 ug via INTRAVENOUS

## 2011-11-26 MED ORDER — FUROSEMIDE 10 MG/ML IJ SOLN
20.0000 mg | Freq: Once | INTRAMUSCULAR | Status: AC
  Administered 2011-11-26: 20 mg via INTRAVENOUS
  Filled 2011-11-26: qty 2

## 2011-11-26 MED ORDER — TRAMADOL HCL 50 MG PO TABS
50.0000 mg | ORAL_TABLET | Freq: Four times a day (QID) | ORAL | Status: DC | PRN
  Filled 2011-11-26: qty 1

## 2011-11-26 MED ORDER — LABETALOL HCL 5 MG/ML IV SOLN
INTRAVENOUS | Status: DC | PRN
  Administered 2011-11-26 (×3): 5 mg via INTRAVENOUS

## 2011-11-26 MED ORDER — ALUM & MAG HYDROXIDE-SIMETH 200-200-20 MG/5ML PO SUSP: 30.0000 mL | ORAL | Status: DC | PRN

## 2011-11-26 MED ORDER — OXYCODONE-ACETAMINOPHEN 5-325 MG PO TABS
1.0000 | ORAL_TABLET | ORAL | Status: DC | PRN
  Administered 2011-11-27 – 2011-11-29 (×2): 2 via ORAL
  Filled 2011-11-26 (×3): qty 2

## 2011-11-26 SURGICAL SUPPLY — 44 items
BAG ZIPLOCK 12X15 (MISCELLANEOUS) ×2 IMPLANT
BIT DRILL 2.0X128 (BIT) ×2 IMPLANT
BLADE SAW SAG 73X25 THK (BLADE) ×1
BLADE SAW SGTL 73X25 THK (BLADE) ×1 IMPLANT
BRUSH FEMORAL CANAL (MISCELLANEOUS) ×2 IMPLANT
CEMENT BONE DEPUY (Cement) ×4 IMPLANT
CLOTH BEACON ORANGE TIMEOUT ST (SAFETY) ×2 IMPLANT
COVER SURGICAL LIGHT HANDLE (MISCELLANEOUS) ×2 IMPLANT
DRAPE INCISE IOBAN 66X45 STRL (DRAPES) ×2 IMPLANT
DRAPE ORTHO SPLIT 77X108 STRL (DRAPES) ×2
DRAPE POUCH INSTRU U-SHP 10X18 (DRAPES) ×2 IMPLANT
DRAPE SURG ORHT 6 SPLT 77X108 (DRAPES) ×2 IMPLANT
DRSG MEPILEX BORDER 4X8 (GAUZE/BANDAGES/DRESSINGS) ×2 IMPLANT
DURAPREP 26ML APPLICATOR (WOUND CARE) ×2 IMPLANT
ELECT REM PT RETURN 9FT ADLT (ELECTROSURGICAL) ×2
ELECTRODE REM PT RTRN 9FT ADLT (ELECTROSURGICAL) ×1 IMPLANT
EVACUATOR 1/8 PVC DRAIN (DRAIN) ×2 IMPLANT
FACESHIELD LNG OPTICON STERILE (SAFETY) ×8 IMPLANT
GLOVE BIO SURGEON STRL SZ8 (GLOVE) ×2 IMPLANT
GLOVE ECLIPSE 7.5 STRL STRAW (GLOVE) ×2 IMPLANT
HANDPIECE INTERPULSE COAX TIP (DISPOSABLE) ×1
IMMOBILIZER KNEE 20 (SOFTGOODS)
IMMOBILIZER KNEE 20 THIGH 36 (SOFTGOODS) IMPLANT
MANIFOLD NEPTUNE II (INSTRUMENTS) ×2 IMPLANT
NEEDLE HYPO 22GX1.5 SAFETY (NEEDLE) ×2 IMPLANT
NS IRRIG 1000ML POUR BTL (IV SOLUTION) ×2 IMPLANT
PACK TOTAL JOINT (CUSTOM PROCEDURE TRAY) ×2 IMPLANT
PASSER SUT SWANSON 36MM LOOP (INSTRUMENTS) ×2 IMPLANT
POSITIONER SURGICAL ARM (MISCELLANEOUS) ×2 IMPLANT
RESTRICTOR CEMENT PE SZ 2 (Cement) ×2 IMPLANT
SET HNDPC FAN SPRY TIP SCT (DISPOSABLE) ×1 IMPLANT
STAPLER VISISTAT 35W (STAPLE) ×2 IMPLANT
SUCTION FRAZIER TIP 10 FR DISP (SUCTIONS) ×2 IMPLANT
SUT ETHIBOND NAB CT1 #1 30IN (SUTURE) ×8 IMPLANT
SUT VIC AB 0 CTX 27 (SUTURE) ×4 IMPLANT
SUT VIC AB 1 CTX 36 (SUTURE) ×4
SUT VIC AB 1 CTX36XBRD ANBCTR (SUTURE) ×4 IMPLANT
SUT VIC AB 2-0 CT1 27 (SUTURE) ×3
SUT VIC AB 2-0 CT1 TAPERPNT 27 (SUTURE) ×3 IMPLANT
SYR CONTROL 10ML LL (SYRINGE) ×2 IMPLANT
TOWEL OR 17X26 10 PK STRL BLUE (TOWEL DISPOSABLE) ×2 IMPLANT
TOWER CARTRIDGE SMART MIX (DISPOSABLE) ×4 IMPLANT
TRAY FOLEY CATH 14FRSI W/METER (CATHETERS) IMPLANT
WATER STERILE IRR 1500ML POUR (IV SOLUTION) ×2 IMPLANT

## 2011-11-26 NOTE — Progress Notes (Signed)
Speech Language/Pathology  [phone] Order received for swallow eval, pt now NPO for scheduled surgery today.  SLP to follow up on Tuesday 11/27/11 am for evalaution. Thanks.  Note pt with h/o left frontal craniotomy.   Donavan Burnet, MS [facility] SLP [phone]

## 2011-11-26 NOTE — Brief Op Note (Signed)
11/23/2011 - 11/26/2011  5:20 PM  PATIENT:  [name]  76 y.o. female  PRE-OPERATIVE DIAGNOSIS:  fracture right hip  POST-OPERATIVE DIAGNOSIS:  fractured right hip  PROCEDURE:  Procedure(s) (LRB): ARTHROPLASTY BIPOLAR HIP (Right)  SURGEON:  Surgeon(s) and Role:    * Harvie Junior, MD - Primary  PHYSICIAN ASSISTANT:   ASSISTANTS: bethune   ANESTHESIA:   general  EBL:  Total I/O In: 700 [I.V.:700] Out: 1000 [Urine:850; Blood:150]  BLOOD ADMINISTERED:none  DRAINS: none   LOCAL MEDICATIONS USED:  MARCAINE     SPECIMEN:  No Specimen  DISPOSITION OF SPECIMEN:  N/A  COUNTS:  YES  TOURNIQUET:  * No tourniquets in log *  DICTATION: .Other Dictation: Dictation Number [id number]  PLAN OF CARE: Admit to inpatient   PATIENT DISPOSITION:  PACU - hemodynamically stable.   Delay start of Pharmacological VTE agent (>24hrs) due to surgical blood loss or risk of bleeding: no

## 2011-11-26 NOTE — Addendum Note (Signed)
Addendum  created 11/26/11 1900 by Einar Pheasant, MD   Modules edited:Inpatient Notes

## 2011-11-26 NOTE — Progress Notes (Signed)
Subjective: Patient seen earlier today. No CP/SOB. On [place] oxygen.  Objective: Vital signs in last 24 hours: Temp:  [98 F (36.7 C)-98.5 F (36.9 C)] 98.5 F (36.9 C) (03/04 1319) Pulse Rate:  [79-109] 79  (03/04 1319) Resp:  [18-20] 20  (03/04 1319) BP: (139-161)/(71-94) 139/71 mmHg (03/04 1319) SpO2:  [93 %-97 %] 94 % (03/04 1319) Weight:  [68 kg (149 lb 14.6 oz)-71.4 kg (157 lb 6.5 oz)] 71.4 kg (157 lb 6.5 oz) (03/04 0723) Weight change:  Last BM Date:  (unknown)  Intake/Output from previous day: 03/03 0701 - 03/04 0700 In: -  Out: 1100 [Urine:1100] Total I/O In: -  Out: 750 [Urine:750]   Physical Exam: General: Alert, awake, oriented x3, in no acute distress. HEENT: No bruits, no goiter. Heart: Regular rate and rhythm, without murmurs, rubs, gallops. Lungs: mild bilateral crackles. Abdomen: Soft, nontender, nondistended, positive bowel sounds. Extremities: No clubbing cyanosis or edema with positive pedal pulses. Neuro: Grossly intact, nonfocal.    Lab Results: Basic Metabolic Panel:  Basename 11/26/11 0458 11/25/11 0610 11/23/11 2024  NA 137 141 --  K 2.9* 2.9* --  CL 95* 102 --  CO2 34* 31 --  GLUCOSE 113* 123* --  BUN 23 19 --  CREATININE 0.99 1.09 --  CALCIUM 9.0 9.1 --  MG -- 1.5 1.4*  PHOS -- -- 2.6   Liver Function Tests:  Basename 11/24/11 0421 11/23/11 2024  AST 14 16  ALT 11 13  ALKPHOS 63 71  BILITOT 0.3 0.3  PROT 6.0 6.8  ALBUMIN 3.1* 3.5   CBC:  Basename 11/25/11 0610 11/24/11 0421 11/23/11 2024 11/23/11 1600  WBC 11.0* 10.9* -- --  NEUTROABS -- -- 12.7* 11.4*  HGB 11.9* 12.1 -- --  HCT 34.9* 34.2* -- --  MCV 94.8 94.2 -- --  PLT 185 217 -- --   Cardiac Enzymes:  Basename 11/24/11 0421 11/23/11 2020  CKTOTAL 183* 80  CKMB 2.4 2.6  CKMBINDEX -- --  TROPONINI <0.30 <0.30   BNP:  Basename 11/23/11 2043  PROBNP 794.2*   CBG:  Basename 11/26/11 1359 11/26/11 0742 11/25/11 0747 11/25/11 0638 11/24/11 0742  GLUCAP 95 114*  118* 126* 116*   Hemoglobin A1C:  Basename 11/23/11 2024  HGBA1C 5.7*   Thyroid Function Tests:  Basename 11/23/11 2024  TSH 0.927  T4TOTAL --  FREET4 --  T3FREE --  THYROIDAB --   Coagulation:  Basename 11/26/11 0458 11/25/11 0610  LABPROT 14.1 15.4*  INR 1.07 1.19   Urinalysis:  Basename 11/23/11 1743  COLORURINE YELLOW  LABSPEC 1.017  PHURINE 7.0  GLUCOSEU NEGATIVE  HGBUR TRACE*  BILIRUBINUR NEGATIVE  KETONESUR 40*  PROTEINUR NEGATIVE  UROBILINOGEN 0.2  NITRITE NEGATIVE  LEUKOCYTESUR NEGATIVE    Recent Results (from the past 240 hour(s))  CULTURE, BLOOD (ROUTINE X 2)     Status: Normal (Preliminary result)   Collection Time   11/23/11  7:00 PM      Component Value Range Status Comment   Specimen Description BLOOD LEFT ARM   Final    Special Requests BOTTLES DRAWN AEROBIC AND ANAEROBIC [facility]   Final    Culture  Setup Time [id number]   Final    Culture     Final    Value:        BLOOD CULTURE RECEIVED NO GROWTH TO DATE CULTURE WILL BE HELD FOR 5 DAYS BEFORE ISSUING A FINAL NEGATIVE REPORT   Report Status PENDING   Incomplete   CULTURE, BLOOD (  ROUTINE X 2)     Status: Normal (Preliminary result)   Collection Time   11/23/11  7:05 PM      Component Value Range Status Comment   Specimen Description BLOOD LEFT HAND   Final    Special Requests BOTTLES DRAWN AEROBIC AND ANAEROBIC 6C   Final    Culture  Setup Time [id number]   Final    Culture     Final    Value:        BLOOD CULTURE RECEIVED NO GROWTH TO DATE CULTURE WILL BE HELD FOR 5 DAYS BEFORE ISSUING A FINAL NEGATIVE REPORT   Report Status PENDING   Incomplete   URINE CULTURE     Status: Normal   Collection Time   11/23/11  7:35 PM      Component Value Range Status Comment   Specimen Description URINE, CATHETERIZED   Final    Special Requests NONE   Final    Culture  Setup Time [id number]   Final    Colony Count 15,000 COLONIES/ML   Final    Culture     Final    Value: Multiple bacterial morphotypes  present, none predominant. Suggest appropriate recollection if clinically indicated.   Report Status 11/26/2011 FINAL   Final   MRSA PCR SCREENING     Status: Abnormal   Collection Time   11/24/11  6:39 AM      Component Value Range Status Comment   MRSA by PCR POSITIVE (*) NEGATIVE  Final     Studies/Results: No results found.  Medications: Scheduled Meds:   . amLODipine  10 mg Oral Daily  . aspirin EC  81 mg Oral Daily  . carbamazepine  200 mg Oral BID  . Chlorhexidine Gluconate Cloth  6 each Topical Q0600  . cholecalciferol  4,000 Units Oral Daily  . citalopram  20 mg Oral Daily  . conjugated estrogens  2 g Vaginal Daily  . estradiol  2 g Vaginal 3 times weekly  . furosemide  40 mg Intravenous BID  . lamoTRIgine  25 mg Oral BID  . lisinopril  40 mg Oral Daily  . magic mouthwash  15 mL Oral TID  . magnesium sulfate 1 - 4 g bolus IVPB  2 g Intravenous Once  . metoprolol  100 mg Oral BID  . mupirocin ointment  1 application Nasal BID  . nitrofurantoin (macrocrystal-monohydrate)  100 mg Oral Q12H  . nystatin  1 g Topical TID  . ondansetron  4 mg Oral QHS  . pantoprazole  40 mg Oral Daily  . potassium chloride  10 mEq Intravenous Q1 Hr x 5  . saccharomyces boulardii  250 mg Oral BID  . sodium chloride  3 mL Intravenous Q12H  . DISCONTD: Chlorhexidine Gluconate Cloth  6 each Topical Q0600   Continuous Infusions:  PRN Meds:.barrier cream, fentaNYL, hyoscyamine, LORazepam, ondansetron (ZOFRAN) IV  Assessment/Plan:  Principal Problem:  *Femoral neck fracture Active Problems:  Diastolic CHF, acute on chronic  DEPRESSION  HYPERTENSION  FIBRILLATION, ATRIAL  CEREBROVASCULAR ACCIDENT  GERD (gastroesophageal reflux disease)  Diastolic CHF, acute  Leukocytosis  Hypokalemia  1 Femoral Neck Fracture: To OR today.   #2 Acute on Chronic Diastolic CHF: ECHO shows EF 65-70% with concentric hypertrophy. Became hypoxemic yesterday pm and a CXR showed new bilateral airspace  disease likely representing pulmonary edema. Will continue with diuresis today. Likely received too much volume with IVF and FFP. Continue strict Is and Os. Respiratory status has improved with diuresis.  #  3 afib: rate controlled. INR reversed for surgery.  #4 Hypokalemia: secondary to diuresis. Replete IV.  #5 Hypomagnesemia: Replete IV.  #6 Dispo: Will likely need SNF at DC.     LOS: 3 days   [facility] Pager: [phone] 11/26/2011, 3:59 PM

## 2011-11-26 NOTE — Transfer of Care (Signed)
Immediate Anesthesia Transfer of Care Note  Patient: [name]  Procedure(s) Performed: Procedure(s) (LRB): ARTHROPLASTY BIPOLAR HIP (Right)  Patient Location: PACU  Anesthesia Type: General  Level of Consciousness: awake, patient cooperative, confused and responds to stimulation, pt oriented only to person preop  Airway & Oxygen Therapy: Patient Spontanous Breathing and Patient connected to face mask oxygen  Post-op Assessment: Report given to PACU RN, Post -op Vital signs reviewed and unstable, Anesthesiologist notified and Patient moving all extremities, Sats mid 80's to low 90's , Dr. Rica Mast notified  Post vital signs: Reviewed and unstable  Complications: No apparent anesthesia complications

## 2011-11-26 NOTE — Anesthesia Postprocedure Evaluation (Signed)
  Anesthesia Post-op Note  Patient: [name]  Procedure(s) Performed: Procedure(s) (LRB): ARTHROPLASTY BIPOLAR HIP (Right)  Patient Location: PACU  Anesthesia Type: General  Level of Consciousness: awake, confused and responds to stimulation  Airway and Oxygen Therapy: Patient Spontanous Breathing and Patient connected to face mask oxygen  Post-op Pain: mild  Post-op Assessment: Post-op Vital signs reviewed and Patent Airway  Post-op Vital Signs: unstable  Complications: respiratory complications

## 2011-11-26 NOTE — Progress Notes (Signed)
ANTICOAGULATION CONSULT NOTE - Follow Up Consult  Pharmacy Consult for Warfarin Indication: atrial fibrillation, s/p THR  Labs:  Basename 11/26/11 0458 11/25/11 0610 11/24/11 1600 11/24/11 0421  HGB -- 11.9* -- 12.1  HCT -- 34.9* -- 34.2*  PLT -- 185 -- 217  APTT -- -- -- 42*  LABPROT 14.1 15.4* 19.0* --  INR 1.07 1.19 1.56* --  HEPARINUNFRC -- -- -- --  CREATININE 0.99 1.09 -- 0.76  CKTOTAL -- -- -- 183*  CKMB -- -- -- 2.4  TROPONINI -- -- -- <0.30   Medications:  Scheduled:    . albuterol  2.5 mg Nebulization Once  . amLODipine  10 mg Oral BID  . aspirin EC  81 mg Oral Daily  . carbamazepine  200 mg Oral BID  . Chlorhexidine Gluconate Cloth  6 each Topical Q0600  . cholecalciferol  4,000 Units Oral Daily  . citalopram  20 mg Oral Daily  . clindamycin (CLEOCIN) IV  600 mg Intravenous Q6H  . conjugated estrogens  2 g Vaginal Daily  . docusate sodium  100 mg Oral BID  . estradiol  2 g Vaginal 3 times weekly  . ferrous sulfate  325 mg Oral BID WC  . furosemide  20 mg Intravenous Once  . furosemide  40 mg Intravenous BID  . labetalol  20 mg Intravenous Once  . lamoTRIgine  25 mg Oral BID  . lisinopril  40 mg Oral Daily  . magic mouthwash  15 mL Oral TID  . magnesium sulfate 1 - 4 g bolus IVPB  2 g Intravenous Once  . metoprolol  100 mg Oral BID  . mupirocin ointment  1 application Nasal BID  . nitrofurantoin (macrocrystal-monohydrate)  100 mg Oral Q12H  . nystatin  1 g Topical TID  . ondansetron  4 mg Oral QHS  . pantoprazole  40 mg Oral Daily  . saccharomyces boulardii  250 mg Oral BID  . warfarin  5 mg Oral Once  . Warfarin - Pharmacist Dosing Inpatient   Does not apply q1800  . DISCONTD: amLODipine  10 mg Oral Daily  . DISCONTD: Chlorhexidine Gluconate Cloth  6 each Topical Q0600  . DISCONTD: furosemide  20 mg Intravenous Once  . DISCONTD: potassium chloride  10 mEq Intravenous Q1 Hr x 5  . DISCONTD: sodium chloride  3 mL Intravenous Q12H  Warfarin PTA dose 2mg   on M,F; 4mg  other days  Assessment:  76 yo F s/p R THR  Chronic Coumadin for Afib, INR reversed with FFP, VitK 5mg  IV for surgery  Ortho note states aim for INR 2, will suggest 2-2.5; low end of therapeutic range as pt on chronic Warfarin for Afib  Goal of Therapy:  (INR 2-3 for Afib), goal post-op aiming for 2-2.5   Plan:  Coumadin 5mg  tonight Continue daily PT/INR  Otho Bellows PharmD Pager [phone] 11/26/2011,8:49 PM

## 2011-11-26 NOTE — Progress Notes (Signed)
Patient continues to run low SaO2 in PACU,  despite 100% FiO2 on a none re-breather face mask. Patient appears in no acute distress, with breath sounds equal and clear bilaterally. Patient will be followed in ICU stepdown and remain on supportive care. CXR report pending. A Rey Dansby MD

## 2011-11-26 NOTE — Anesthesia Preprocedure Evaluation (Addendum)
Anesthesia Evaluation  Patient identified by MRN, date of birth, ID band Patient confused    Reviewed: Allergy & Precautions, H&P , NPO status , Patient's Chart, lab work & pertinent test results, reviewed documented beta blocker date and time   History of Anesthesia Complications Negative for: history of anesthetic complications  Airway Mallampati: II TM Distance: >3 FB Neck ROM: Full    Dental  (+) Teeth Intact and Dental Advisory Given   Pulmonary neg pulmonary ROS,  breath sounds clear to auscultation  Pulmonary exam normal       Cardiovascular Exercise Tolerance: Poor hypertension, + DOE + dysrhythmias Atrial Fibrillation Rhythm:Irregular Rate:Abnormal     Neuro/Psych PSYCHIATRIC DISORDERS Depression Patient oriented to person only; speech aphasia CVA (Right hemiparesis per daughter), Residual Symptoms    GI/Hepatic Neg liver ROS, GERD-  Medicated,  Endo/Other  negative endocrine ROS  Renal/GU negative Renal ROS  negative genitourinary   Musculoskeletal negative musculoskeletal ROS (+)   Abdominal   Peds  Hematology negative hematology ROS (+)   Anesthesia Other Findings   Reproductive/Obstetrics                          Anesthesia Physical Anesthesia Plan  ASA: III  Anesthesia Plan: General   Post-op Pain Management:    Induction: Intravenous  Airway Management Planned: Oral ETT  Additional Equipment:   Intra-op Plan:   Post-operative Plan: Extubation in OR  Informed Consent: I have reviewed the patients History and Physical, chart, labs and discussed the procedure including the risks, benefits and alternatives for the proposed anesthesia with the patient or authorized representative who has indicated his/her understanding and acceptance.   Dental advisory given  Plan Discussed with: CRNA  Anesthesia Plan Comments:         Anesthesia Quick Evaluation

## 2011-11-26 NOTE — Anesthesia Procedure Notes (Signed)
Procedure Name: Intubation Date/Time: 11/26/2011 3:49 PM Performed by: Randon Goldsmith CATHERINE PAYNE Pre-anesthesia Checklist: Patient identified, Emergency Drugs available, Suction available and Patient being monitored Patient Re-evaluated:Patient Re-evaluated prior to inductionOxygen Delivery Method: Circle system utilized Preoxygenation: Pre-oxygenation with 100% oxygen Intubation Type: IV induction Ventilation: Mask ventilation without difficulty Laryngoscope Size: Miller and 2 Grade View: Grade I Tube type: Oral Number of attempts: 1 Airway Equipment and Method: Stylet Placement Confirmation: ETT inserted through vocal cords under direct vision,  positive ETCO2,  CO2 detector and breath sounds checked- equal and bilateral Secured at: 21 cm Tube secured with: Tape Dental Injury: Teeth and Oropharynx as per pre-operative assessment

## 2011-11-26 NOTE — Progress Notes (Signed)
Subjective: Pt better today.  C/o R. Hip pain.     Objective: Vital signs in last 24 hours: Temp:  [97.5 F (36.4 C)-98.4 F (36.9 C)] 98 F (36.7 C) (03/04 0723) Pulse Rate:  [83-109] 83  (03/04 0723) Resp:  [18] 18  (03/04 0723) BP: (148-161)/(77-94) 161/94 mmHg (03/04 0723) SpO2:  [92 %-97 %] 97 % (03/04 0723) Weight:  [68 kg (149 lb 14.6 oz)-71.4 kg (157 lb 6.5 oz)] 71.4 kg (157 lb 6.5 oz) (03/04 0723)  Intake/Output from previous day: 03/03 0701 - 03/04 0700 In: -  Out: 1100 [Urine:1100] Intake/Output this shift: Total I/O In: -  Out: 250 [Urine:250]   Basename 11/25/11 0610 11/24/11 0421 11/23/11 2024 11/23/11 1600  HGB 11.9* 12.1 13.1 14.3    Basename 11/25/11 0610 11/24/11 0421  WBC 11.0* 10.9*  RBC 3.68* 3.63*  HCT 34.9* 34.2*  PLT 185 217    Basename 11/26/11 0458 11/25/11 0610  NA 137 141  K 2.9* 2.9*  CL 95* 102  CO2 34* 31  BUN 23 19  CREATININE 0.99 1.09  GLUCOSE 113* 123*  CALCIUM 9.0 9.1    Basename 11/26/11 0458 11/25/11 0610  LABPT -- --  INR 1.07 1.19    Neurologically intact ABD soft Neurovascular intact Sensation intact distally Intact pulses distally Dorsiflexion/Plantar flexion intact No cellulitis present Compartment soft pain on r. hip rom  Assessment/Plan: Pt with respiratory status improved and INR in normal range.  Plan for surgery today at 3pm.   Ab Leaming L 11/26/2011, 9:05 AM

## 2011-11-26 NOTE — Progress Notes (Signed)
UR complete 

## 2011-11-26 NOTE — Progress Notes (Signed)
RN notified that patient will be transferred to Step Down.  RN packed all of patient's belongings, including clothes, glasses, hairbrush, cards, etc. In patient belonging bag.  Patient's daughter came up to the unit to pick up bag and vase of flowers.  No other personal belongings left in the room.

## 2011-11-26 NOTE — Preoperative (Signed)
Beta Blockers   Reason not to administer Beta Blockers:Not Applicable, pt took BB this AM

## 2011-11-27 ENCOUNTER — Encounter (HOSPITAL_COMMUNITY): Payer: Self-pay | Admitting: *Deleted

## 2011-11-27 LAB — BASIC METABOLIC PANEL
CO2: 32 mEq/L (ref 19–32)
Calcium: 8.9 mg/dL (ref 8.4–10.5)
Chloride: 96 mEq/L (ref 96–112)
GFR calc Af Amer: 60 mL/min — ABNORMAL LOW (ref >90)
Sodium: 136 mEq/L (ref 135–145)

## 2011-11-27 LAB — CBC
MCH: 32.6 pg (ref 26.0–34.0)
Platelets: 190 10*3/uL (ref 150–400)
RBC: 3.22 MIL/uL — ABNORMAL LOW (ref 3.87–5.11)
WBC: 8.7 10*3/uL (ref 4.0–10.5)

## 2011-11-27 LAB — PROTIME-INR
INR: 1.08 (ref 0.00–1.49)
Prothrombin Time: 14.2 seconds (ref 11.6–15.2)

## 2011-11-27 LAB — MAGNESIUM: Magnesium: 2 mg/dL (ref 1.5–2.5)

## 2011-11-27 MED ORDER — WARFARIN SODIUM 4 MG PO TABS
4.0000 mg | ORAL_TABLET | Freq: Once | ORAL | Status: AC
  Administered 2011-11-27: 4 mg via ORAL
  Filled 2011-11-27: qty 1

## 2011-11-27 NOTE — Progress Notes (Signed)
Physical Therapy Treatment Patient Details Name: [name] MRN: [id number] DOB: 18-Feb-1936 Today's Date: 11/27/2011  PT Assessment/Plan  PT - Assessment/Plan Comments on Treatment Session: Patient tired and pale from OOB time today.  Needed increased assist to get back to bed. PT Plan: Discharge plan remains appropriate PT Frequency: Min 3X/week Follow Up Recommendations: Skilled nursing facility Equipment Recommended: Defer to next venue PT Goals  Acute Rehab PT Goals PT Goal Formulation: With patient/family Time For Goal Achievement: 2 weeks Pt will go Sit to Supine/Side: with mod assist PT Goal: Sit to Supine/Side - Progress: Progressing toward goal Pt will Transfer Bed to Chair/Chair to Bed: with +2 total assist PT Transfer Goal: Bed to Chair/Chair to Bed - Progress: Progressing toward goal PT Treatment Precautions/Restrictions  Precautions Precautions: Fall;Posterior Hip Precaution Comments: no order for hip precautions, but noted knee immobilizer and s/p hemiarthroplasty Required Braces or Orthoses: Yes Knee Immobilizer:  (for hip precautions) Restrictions Weight Bearing Restrictions: Yes RLE Weight Bearing: Weight bearing as tolerated Mobility (including Balance) Bed Mobility Bed Mobility: Yes Sit to Supine: 1: +2 Total assist Sit to Supine - Details (indicate cue type and reason): pt=10%, lifted legs and pivoted patient into bed Transfers Transfers: Yes Sit to Stand: 1: +2 Total assist;From chair/3-in-1;With armrests Sit to Stand Details (indicate cue type and reason): cues and assist to use armrests, pt=20%, unable to stand to pivot with RW, so sat back on chair Stand to Sit: 1: +2 Total assist;To chair/3-in-1 Stand to Sit Details: lowered to chair pt dependent Squat Pivot Transfers: 1: +2 Total assist Squat Pivot Transfer Details (indicate cue type and reason): pt<10%, weakness limited participation  Posture/Postural Control Posture/Postural Control: Postural  limitations Postural Limitations: decreased postural awareness with h/o CVA and right LE extensor tone in standing with external rotation and plantarflexion Balance Balance Assessed: Yes Static Sitting Balance Static Sitting - Balance Support: Right upper extremity supported Static Sitting - Level of Assistance: 4: Min assist Static Sitting - Comment/# of Minutes: sat at edge of bed while IV and other lines replaced with min assist leaning on rail on right Exercise   End of Session PT - End of Session Equipment Utilized During Treatment: Gait belt Activity Tolerance: Patient limited by fatigue Patient left: in bed;with call bell in reach Nurse Communication: Other (comment) (vitals stable despite decreased responsiveness) General Behavior During Session: [facility] for tasks performed Cognition: Impaired, at baseline  [facility] 11/27/2011, 3:31 PM

## 2011-11-27 NOTE — Evaluation (Signed)
Physical Therapy Evaluation Patient Details Name: [name] MRN: [id number] DOB: 1936/01/25 Today's Date: 11/27/2011  Problem List:  Patient Active Problem List  Diagnoses  . DEPRESSION  . HYPERTENSION  . FIBRILLATION, ATRIAL  . CEREBROVASCULAR ACCIDENT  . ESOPHAGEAL STRICTURE  . DIVERTICULOSIS, COLON  . GERD (gastroesophageal reflux disease)  . Proctocolitis  . Diastolic CHF, acute  . Leukocytosis  . Hypokalemia  . Femoral neck fracture  . Diastolic CHF, acute on chronic  . Fracture of femoral neck, right    Past Medical History:  Past Medical History  Diagnosis Date  . Depressive disorder, not elsewhere classified   . Atrial fibrillation   . Unspecified transient cerebral ischemia   . Unspecified cerebral artery occlusion with cerebral infarction   . Unspecified essential hypertension   . Diverticulosis of colon (without mention of hemorrhage)   . Stricture and stenosis of esophagus   . Seizure disorder   . GERD (gastroesophageal reflux disease)   . IBS (irritable bowel syndrome)    Past Surgical History:  Past Surgical History  Procedure Date  . Hemorrhoid surgery   . Benign braintumor     Removal  . Flexible sigmoidoscopy 09/02/2011    Procedure: FLEXIBLE SIGMOIDOSCOPY;  Surgeon: Erick Blinks, MD;  Location: [facility] ENDOSCOPY;  Service: Gastroenterology;  Laterality: N/A;    PT Assessment/Plan/Recommendation PT Assessment Clinical Impression Statement: Patient s/p fall with right femoral neck fracture and hemiarthroplasty presents with decreased independence with mobility due to pain, weakness, decreased balance and postural awareness with h/o CVA and aphasia.  She will benefit from skilled PT in the acute setting to maximize independence and safety for decreased burden of care at next venue.  Per spouse they would like to pursue rehab at [facility]. PT Recommendation/Assessment: Patient will need skilled PT in the acute care venue PT Problem List: Decreased range  of motion;Decreased strength;Decreased activity tolerance;Decreased balance;Decreased mobility;Pain PT Therapy Diagnosis : Generalized weakness;Difficulty walking;Abnormality of gait;Acute pain PT Plan PT Frequency: Min 3X/week PT Treatment/Interventions: DME instruction;Gait training;Functional mobility training;Patient/family education;Therapeutic activities;Therapeutic exercise;Balance training PT Recommendation Follow Up Recommendations: Skilled nursing facility Equipment Recommended: Defer to next venue PT Goals  Acute Rehab PT Goals PT Goal Formulation: With patient/family Time For Goal Achievement: 2 weeks Pt will go Supine/Side to Sit: with mod assist PT Goal: Supine/Side to Sit - Progress: Goal set today Pt will go Sit to Supine/Side: with mod assist PT Goal: Sit to Supine/Side - Progress: Goal set today Pt will go Sit to Stand: with +2 total assist (pt=50%) PT Goal: Sit to Stand - Progress: Goal set today Pt will go Stand to Sit: with +2 total assist (pt=50%) PT Goal: Stand to Sit - Progress: Goal set today Pt will Transfer Bed to Chair/Chair to Bed: with +2 total assist (pt=50%) PT Transfer Goal: Bed to Chair/Chair to Bed - Progress: Goal set today Pt will Ambulate: 1 - 15 feet;with +2 total assist (pt=50%) PT Goal: Ambulate - Progress: Goal set today Pt will Perform Home Exercise Program: with min assist PT Goal: Perform Home Exercise Program - Progress: Goal set today  PT Evaluation Precautions/Restrictions  Precautions Precautions: Fall;Posterior Hip Precaution Comments: no order for hip precautions, but noted knee immobilizer and s/p hemiarthroplasty Required Braces or Orthoses: Yes Knee Immobilizer:  (for hip precautions) Restrictions RLE Weight Bearing: Weight bearing as tolerated Prior Functioning  Home Living Lives With: Spouse Type of Home: House Home Layout: One level Home Access: Stairs to enter Entrance Stairs-Rails: Right Entrance Stairs-Number of  Steps: 4 Bathroom Shower/Tub: Banker: Shower chair with back Prior Function Level of Independence: Independent with basic ADLs;Independent with transfers;Independent with gait (without device, but h/o falls per spouse) Cognition Cognition Arousal/Alertness: Awake/alert Overall Cognitive Status: Difficult to assess Difficult to assess due to: impaired communication (expressive aphasia) Sensation/Coordination   Extremity Assessment RLE Assessment RLE Assessment: Exceptions to [facility] RLE AROM (degrees) Overall AROM Right Lower Extremity: Deficits;Due to pain RLE Overall AROM Comments: AAROM ankle WFL, hip and knee only to approx 30* in supine with pt resistive to movement RLE Strength RLE Overall Strength Comments: strong quads with resistance to flexion LLE Assessment LLE Assessment: Exceptions to WFL LLE AROM (degrees) Overall AROM Left Lower Extremity: Within functional limits for tasks assessed LLE Strength LLE Overall Strength Comments: at least 3/5 with pain on right side lifting left leg up Mobility (including Balance) Bed Mobility Bed Mobility: Yes Supine to Sit: 1: +2 Total assist Supine to Sit Details (indicate cue type and reason): pt=10% with cues and increased time for technique; ? slow processing vs. weak Sitting - Scoot to Edge of Bed: 2: Max assist Sitting - Scoot to [employer] of Bed Details (indicate cue type and reason): scooted forward on pad under her Transfers Transfers: Yes Sit to Stand: 1: +2 Total assist;From bed Sit to Stand Details (indicate cue type and reason): pt=15%; assist for hips forward and shoulders back Stand to Sit: To chair/3-in-1;1: +2 Total assist Stand to Sit Details: lowered to chair due to weak and decreased responsiveness Stand Pivot Transfers: 1: +2 Total assist Stand Pivot Transfer Details (indicate cue type and reason): pt intially 20%, then dependent due to decreased responsiveness.  BP 141/80; HR 90's and RN  reports stable on monitor, SpO2 90% on 3 L O2; RN to monitor for signs of hypoglycemia  Posture/Postural Control Posture/Postural Control: Postural limitations Postural Limitations: decreased postural awareness with h/o CVA and right LE extensor tone in standing with external rotation and plantarflexion Balance Balance Assessed: Yes Static Sitting Balance Static Sitting - Balance Support: Left upper extremity supported;Right upper extremity supported Static Sitting - Level of Assistance: 3: Mod assist Static Sitting - Comment/# of Minutes: leans forward sitting at edge of bed Exercise  Total Joint Exercises Ankle Circles/Pumps: AAROM;Both;5 reps;Supine Heel Slides: AAROM;Right;5 reps;Supine End of Session PT - End of Session Equipment Utilized During Treatment: Gait belt Activity Tolerance: Patient limited by pain;Treatment limited secondary to medical complications (Comment) (decreased responsiveness) Patient left: in chair;with call bell in reach Nurse Communication: Other (comment) (vitals stable despite decreased responsiveness) General Behavior During Session: [facility] for tasks performed Cognition: Impaired, at baseline  [facility] 11/27/2011, 3:25 PM

## 2011-11-27 NOTE — Progress Notes (Signed)
ANTICOAGULATION CONSULT NOTE - Follow Up Consult  Pharmacy Consult for Warfarin Indication: atrial fibrillation, s/p THR  Labs:  Basename 11/27/11 0443 11/27/11 0430 11/26/11 0458 11/25/11 0610  HGB -- 10.5* -- 11.9*  HCT -- 30.6* -- 34.9*  PLT -- 190 -- 185  APTT -- -- -- --  LABPROT -- 14.2 14.1 15.4*  INR -- 1.08 1.07 1.19  HEPARINUNFRC -- -- -- --  CREATININE 1.04 -- 0.99 1.09  CKTOTAL -- -- -- --  CKMB -- -- -- --  TROPONINI -- -- -- --   Medications:  Scheduled:     . albuterol  2.5 mg Nebulization Once  . amLODipine  10 mg Oral BID  . aspirin EC  81 mg Oral Daily  . carbamazepine  200 mg Oral BID  . Chlorhexidine Gluconate Cloth  6 each Topical Q0600  . cholecalciferol  4,000 Units Oral Daily  . citalopram  20 mg Oral Daily  . clindamycin (CLEOCIN) IV  600 mg Intravenous Q6H  . conjugated estrogens  2 g Vaginal Daily  . docusate sodium  100 mg Oral BID  . estradiol  2 g Vaginal 3 times weekly  . ferrous sulfate  325 mg Oral BID WC  . furosemide  20 mg Intravenous Once  . furosemide  40 mg Intravenous BID  . labetalol  20 mg Intravenous Once  . lamoTRIgine  25 mg Oral BID  . lisinopril  40 mg Oral Daily  . magic mouthwash  15 mL Oral TID  . metoprolol  100 mg Oral BID  . mupirocin ointment  1 application Nasal BID  . nitrofurantoin (macrocrystal-monohydrate)  100 mg Oral Q12H  . nystatin  1 g Topical TID  . ondansetron  4 mg Oral QHS  . pantoprazole  40 mg Oral Daily  . saccharomyces boulardii  250 mg Oral BID  . warfarin  5 mg Oral Once  . Warfarin - Pharmacist Dosing Inpatient   Does not apply q1800  . DISCONTD: amLODipine  10 mg Oral Daily  . DISCONTD: furosemide  20 mg Intravenous Once  . DISCONTD: potassium chloride  10 mEq Intravenous Q1 Hr x 5  . DISCONTD: sodium chloride  3 mL Intravenous Q12H   Warfarin PTA dose 2mg  on M,F; 4mg  other days  Assessment:  76 yo F s/p R THR  Chronic Coumadin for Afib, INR reversed with FFP, VitK 5mg  IV for  surgery  Ortho note states aim for INR 2, will suggest 2-2.5; low end of therapeutic range as pt on chronic Warfarin for Afib  No bleeding reported in chart notes  Goal of Therapy:  (INR 2-3 for Afib), goal post-op aiming for 2-2.5   Plan:  Coumadin 4mg  tonight Continue daily PT/INR  Annia Belt PharmD Pager [phone] 11/27/2011,1:34 PM

## 2011-11-27 NOTE — Progress Notes (Signed)
Subjective: 1 Day Post-Op Procedure(s) (LRB): ARTHROPLASTY BIPOLAR HIP (Right) Patient reports pain as 4 on 0-10 scale.  Pt looks great today.   Objective: Vital signs in last 24 hours: Temp:  [96.8 F (36 C)-98.5 F (36.9 C)] 98.4 F (36.9 C) (03/05 0640) Pulse Rate:  [75-115] 93  (03/05 0640) Resp:  [12-23] 20  (03/05 0640) BP: (94-164)/(46-105) 141/83 mmHg (03/05 0640) SpO2:  [81 %-98 %] 91 % (03/05 0640) FiO2 (%):  [15 %-28 %] 28 % (03/04 2011) Weight:  [72 kg (158 lb 11.7 oz)-73.6 kg (162 lb 4.1 oz)] 73 kg (160 lb 15 oz) (03/05 0640)  Intake/Output from previous day: 03/04 0701 - 03/05 0700 In: 2354 [P.O.:314; I.V.:1690; IV Piggyback:350] Out: 1660 [Urine:1510; Blood:150] Intake/Output this shift:     Basename 11/27/11 0430 11/25/11 0610  HGB 10.5* 11.9*    Basename 11/27/11 0430 11/25/11 0610  WBC 8.7 11.0*  RBC 3.22* 3.68*  HCT 30.6* 34.9*  PLT 190 185    Basename 11/27/11 0443 11/26/11 0458  NA 136 137  K 3.4* 2.9*  CL 96 95*  CO2 32 34*  BUN 28* 23  CREATININE 1.04 0.99  GLUCOSE 124* 113*  CALCIUM 8.9 9.0    Basename 11/27/11 0430 11/26/11 0458  LABPT -- --  INR 1.08 1.07    Neurologically intact ABD soft Neurovascular intact Sensation intact distally Intact pulses distally No cellulitis present Compartment soft  Assessment/Plan: 1 Day Post-Op Procedure(s) (LRB): ARTHROPLASTY BIPOLAR HIP (Right) Advance diet Up with therapy  Koleson Reifsteck L 11/27/2011, 9:51 AM

## 2011-11-27 NOTE — Clinical Documentation Improvement (Signed)
RESPIRATORY FAILURE DOCUMENTATION CLARIFICATION QUERY   THIS DOCUMENT IS NOT A PERMANENT PART OF THE MEDICAL RECORD  TO RESPOND TO THE THIS QUERY, FOLLOW THE INSTRUCTIONS BELOW:  1. If needed, update documentation for the patient's encounter via the notes activity.  2. Access this query again and click edit on the In [employer].  3. After updating, or not, click F2 to complete all highlighted (required) fields concerning your review. Select "additional documentation in the medical record" OR "no additional documentation provided".  4. Click Sign note button.  5. The deficiency will fall out of your In Basket *Please let us know if you are not able to complete this workflow by phone or e-mail (listed below).  Please update your documentation within the medical record to reflect your response to this query.                                                                                    11/27/11  Dear Dr.HERNANDEZ Loreta Ave Marton Redwood,  In a better effort to capture your patient's severity of illness, reflect appropriate length of stay and utilization of resources, a review of the patient medical record has revealed the following indicators.    Based on your clinical judgment, please clarify and document in a progress note and/or discharge summary the clinical condition associated with the following supporting information:  In responding to this query please exercise your independent judgment.  The fact that a query is asked, does not imply that any particular answer is desired or expected. Pt with respiratory complications.  Please clarify/validate on progress notes/ d/c summary if  a condition listed below provides greater specificity regarding  this pt. respiratory status.    Possible Clinical Conditions?  _______Acute Respiratory Failure _______Acute on Chronic Respiratory Failure _______Chronic Respiratory Failure _______Acute Respiratory Insufficiency _______Acute Respiratory  Insufficiency following surgery or trauma _______Other Condition________________ _______Cannot Clinically Determine    Supporting Information: Risk Factors:CHF,  essential hypertension    Signs&Symptoms: per Nsg 11/24/11 :  O2 sats decreased  70% RA,   placed on  nonrebreather 100% , now satting low 90s on 100% NRB per pn : Lungs: mild bilateral crackles  Anesthesia Post-op Note 11/26/11 : Post-op Vital Signs: unstable Complications: respiratory complications, pt transferred to ICU stepdown with 100% FiO2    Radiology: CXR ordered STAT and shows bilateral airspace disease most likely representing pulmonary edema  Treatment: O2 Mode:  non rebreather mask @ 100% Cardiac monitioring, assess Steward  Sedation Score,  Respiratory Treatment:Albuterol,  Incentive spirometry, continuous pulse ox,                     You may use possible, probable, or suspect with inpatient documentation. possible, probable, suspected diagnoses MUST be documented at the time of discharge  Reviewed: additional documentation in the medical record  Thank You,  Andy Gauss RN  Clinical Documentation Specialist:  Pager [phone] E-mail [email or web address]   Health Information Management [place]

## 2011-11-27 NOTE — Op Note (Signed)
NAMEGLAYDS, [name]                 ACCOUNT NO.:  [id number]  MEDICAL RECORD NO.:  [id number]  LOCATION:  1443                         FACILITY:  [facility]  PHYSICIAN:  Harvie Junior, M.D.   DATE OF BIRTH:  [birth year]  DATE OF PROCEDURE:  11/26/2011 DATE OF DISCHARGE:                              OPERATIVE REPORT   PREOPERATIVE DIAGNOSIS:  Femoral neck fracture, right.  POSTOPERATIVE DIAGNOSIS:  Femoral neck fracture, right.  PROCEDURES:  Right hemiarthroplasty with a [place] basic stem, cemented, size 3 with a 46 mm head ball Monopolar with a +0 neck length.  SURGEON:  Harvie Junior, M.D.  ASSISTANT:  Marshia Ly, P.A.  ANESTHESIA:  General.  BRIEF HISTORY:  [name] is a 76 year old female with a long history of fall and then broken her femoral back on the right side.  We evaluated in the emergency room and noted her to have femoral neck fracture and felt she needed hemiarthroplasty.  She unfortunately had an INR of 3.2 at the time, and we felt that we needed to give her an opportunity for this to come down.  She drifted down over the next several days, and we were prepared to do her surgery on Sunday, unfortunately she had a complication of some pulmonary edema and it was felt that it was best to give her another day to get this resolve, did in fact resolve and when she was in the best possible situation, she was taken to the operating room for right hemiarthroplasty.  DESCRIPTION OF THE PROCEDURE:  The patient was taken to the operative room.  After adequate anesthesia was obtained with general anesthetic, the patient was placed on the operating table.  The patient was then moved in the left lateral decubitus position with all bony prominences well padded and an axillary roll was put in place.  Attention was turned to the right hip, which was prepped and draped in usual sterile fashion and following this, the incision was made for posterior approach to hip and the  tensor fascia was divided in line with its fibers.  The gluteus maximus fascia was divided on top and underneath, and the muscle was finger fractured.  Once this was done, attention was turned to the posterior capsule, where the piriformis and short external rotators were taken down as a group and tagged x3, and the ball of the hip was then removed and measured on the back table to 46.  Once this was done, the attention was turned to the stem, which was lateralized after a cookie cutter was used to open the canal.  The canal finer was used; and once that was done, we were able to manipulate the stem down and had a difficult time really getting the two in, went to a 3 and guide advanced, still had a little bit of way to go, now I was afraid to go to 4, so we felt that cementing a 3 was appropriate.  At this point, we opened materials to cement the stem in.  We placed a cement plug distally.  Because of the size 3 stem, there was no Cementralizer. There was appropriate size, so the Cementralizer  was not to place.  The stem was then placed and moved into as much anteversion as could be obtained in the canal.  The cement was allowed to harden.  Once the cement was allowed to harden, we re-trialed it with a +0 excellent stability.  She seemed to have a little bit deficiency in the posterior acetabulum, but she had good stability and once we trialed this, we put the final head in place and tested again, excellent stability was achieved at 90 degrees of flexion and internal rotation.  Short external rotators and piriformis were repaired to the posterior intertrochanteric line through drill holes and we superiorly close the capsule superiorly as well.  Once that was done, attention was turned towards the tensor fascia which was closed with 1 Vicryl running, skin with 0 and 2-0 Vicryl, and skin staples.  Sterile compressive dressing was applied, and the patient was taken to recovery room.  She  noted to be in a satisfactory condition.  Estimated blood loss for this procedure was 250 cc.     Harvie Junior, M.D.     Ranae Plumber  D:  11/26/2011  T:  11/27/2011  Job:  [id number]

## 2011-11-27 NOTE — Progress Notes (Signed)
Subjective: Patient seen earlier today. Is postop day 1 right hip arthroplasty by Dr. Luiz Blare. She has no complaints.  Objective: Vital signs in last 24 hours: Temp:  [96.8 F (36 C)-98.5 F (36.9 C)] 97.9 F (36.6 C) (03/05 1433) Pulse Rate:  [75-115] 82  (03/05 1433) Resp:  [12-23] 16  (03/05 1600) BP: (94-164)/(46-105) 125/77 mmHg (03/05 1433) SpO2:  [81 %-98 %] 92 % (03/05 1600) FiO2 (%):  [15 %-28 %] 28 % (03/04 2011) Weight:  [72 kg (158 lb 11.7 oz)-73.6 kg (162 lb 4.1 oz)] 73 kg (160 lb 15 oz) (03/05 0640) Weight change: 3.4 kg (7 lb 7.9 oz) Last BM Date: 11/25/11  Intake/Output from previous day: 03/04 0701 - 03/05 0700 In: 2354 [P.O.:314; I.V.:1690; IV Piggyback:350] Out: 1660 [Urine:1510; Blood:150] Total I/O In: 360 [P.O.:360] Out: 1100 [Urine:1100]   Physical Exam: General: Alert, awake, oriented x3, in no acute distress. HEENT: No bruits, no goiter. Heart: Regular rate and rhythm, without murmurs, rubs, gallops. Lungs: Mild bilateral crackles. Abdomen: Soft, nontender, nondistended, positive bowel sounds. Extremities: No clubbing cyanosis or edema with positive pedal pulses. Neuro: Grossly intact, nonfocal.    Lab Results: Basic Metabolic Panel:  Basename 11/27/11 0443 11/26/11 0458 11/25/11 0610  NA 136 137 --  K 3.4* 2.9* --  CL 96 95* --  CO2 32 34* --  GLUCOSE 124* 113* --  BUN 28* 23 --  CREATININE 1.04 0.99 --  CALCIUM 8.9 9.0 --  MG -- -- 1.5  PHOS -- -- --   CBC:  Basename 11/27/11 0430 11/25/11 0610  WBC 8.7 11.0*  NEUTROABS -- --  HGB 10.5* 11.9*  HCT 30.6* 34.9*  MCV 95.0 94.8  PLT 190 185   CBG:  Basename 11/26/11 1359 11/26/11 0742 11/25/11 0747 11/25/11 0638  GLUCAP 95 114* 118* 126*   Coagulation:  Basename 11/27/11 0430 11/26/11 0458  LABPROT 14.2 14.1  INR 1.08 1.07    Recent Results (from the past 240 hour(s))  CULTURE, BLOOD (ROUTINE X 2)     Status: Normal (Preliminary result)   Collection Time   11/23/11   7:00 PM      Component Value Range Status Comment   Specimen Description BLOOD LEFT ARM   Final    Special Requests BOTTLES DRAWN AEROBIC AND ANAEROBIC 7CC   Final    Culture  Setup Time [id number]   Final    Culture     Final    Value:        BLOOD CULTURE RECEIVED NO GROWTH TO DATE CULTURE WILL BE HELD FOR 5 DAYS BEFORE ISSUING A FINAL NEGATIVE REPORT   Report Status PENDING   Incomplete   CULTURE, BLOOD (ROUTINE X 2)     Status: Normal (Preliminary result)   Collection Time   11/23/11  7:05 PM      Component Value Range Status Comment   Specimen Description BLOOD LEFT HAND   Final    Special Requests BOTTLES DRAWN AEROBIC AND ANAEROBIC 6C   Final    Culture  Setup Time [id number]   Final    Culture     Final    Value:        BLOOD CULTURE RECEIVED NO GROWTH TO DATE CULTURE WILL BE HELD FOR 5 DAYS BEFORE ISSUING A FINAL NEGATIVE REPORT   Report Status PENDING   Incomplete   URINE CULTURE     Status: Normal   Collection Time   11/23/11  7:35 PM  Component Value Range Status Comment   Specimen Description URINE, CATHETERIZED   Final    Special Requests NONE   Final    Culture  Setup Time [id number]   Final    Colony Count 15,000 COLONIES/ML   Final    Culture     Final    Value: Multiple bacterial morphotypes present, none predominant. Suggest appropriate recollection if clinically indicated.   Report Status 11/26/2011 FINAL   Final   MRSA PCR SCREENING     Status: Abnormal   Collection Time   11/24/11  6:39 AM      Component Value Range Status Comment   MRSA by PCR POSITIVE (*) NEGATIVE  Final     Studies/Results: Dg Pelvis Portable  11/26/2011  *RADIOLOGY REPORT*  Clinical Data: Postoperative for right total hip replacement.  PORTABLE PELVIS  Comparison: 11/23/2011  Findings: A bipolar right hip prosthesis is in place, without fracture or complicating feature observed.  Expected gas noted in the soft tissues.  IMPRESSION:  1.  Right hip bipolar prosthesis noted, without  complicating feature.  Original Report Authenticated By: Dellia Cloud, M.D.   Dg Chest Port 1 View  11/26/2011  *RADIOLOGY REPORT*  Clinical Data: Post Anesthesia breathing problems.  PORTABLE CHEST - 1 VIEW  Comparison: 11/24/2011  Findings: Bilateral lower lobe airspace opacities are present, mildly increased in the left lower lobe but reduced in the right lower lobe.  Underlying interstitial accentuation is present.  Atherosclerotic calcification of the aortic arch is noted.  Mild cardiomegaly is present.  IMPRESSION:  1.  Bibasilar airspace opacities, increased on the left but reduced on the right compared to the exam from 2 days ago.  There is mild underlying interstitial accentuation is well.  Differential diagnostic considerations include edema and pneumonia. 2.  Atherosclerosis. 3.  Mild cardiomegaly.  Original Report Authenticated By: Dellia Cloud, M.D.    Medications: Scheduled Meds:   . albuterol  2.5 mg Nebulization Once  . amLODipine  10 mg Oral BID  . aspirin EC  81 mg Oral Daily  . carbamazepine  200 mg Oral BID  . Chlorhexidine Gluconate Cloth  6 each Topical Q0600  . cholecalciferol  4,000 Units Oral Daily  . citalopram  20 mg Oral Daily  . clindamycin (CLEOCIN) IV  600 mg Intravenous Q6H  . conjugated estrogens  2 g Vaginal Daily  . docusate sodium  100 mg Oral BID  . estradiol  2 g Vaginal 3 times weekly  . ferrous sulfate  325 mg Oral BID WC  . furosemide  20 mg Intravenous Once  . furosemide  40 mg Intravenous BID  . labetalol  20 mg Intravenous Once  . lamoTRIgine  25 mg Oral BID  . lisinopril  40 mg Oral Daily  . magic mouthwash  15 mL Oral TID  . metoprolol  100 mg Oral BID  . mupirocin ointment  1 application Nasal BID  . nitrofurantoin (macrocrystal-monohydrate)  100 mg Oral Q12H  . nystatin  1 g Topical TID  . ondansetron  4 mg Oral QHS  . pantoprazole  40 mg Oral Daily  . saccharomyces boulardii  250 mg Oral BID  . warfarin  4 mg Oral  ONCE-1800  . warfarin  5 mg Oral Once  . Warfarin - Pharmacist Dosing Inpatient   Does not apply q1800  . DISCONTD: amLODipine  10 mg Oral Daily  . DISCONTD: furosemide  20 mg Intravenous Once  . DISCONTD: potassium chloride  10  mEq Intravenous Q1 Hr x 5  . DISCONTD: sodium chloride  3 mL Intravenous Q12H   Continuous Infusions:   . sodium chloride 10 mL/hr (11/26/11 2032)  . DISCONTD: lactated ringers     PRN Meds:.acetaminophen, acetaminophen, alum & mag hydroxide-simeth, barrier cream, hyoscyamine, LORazepam, ondansetron (ZOFRAN) IV, ondansetron, oxyCODONE-acetaminophen, traMADol, DISCONTD: bupivacaine, DISCONTD: fentaNYL, DISCONTD: fentaNYL, DISCONTD: ondansetron (ZOFRAN) IV, DISCONTD: promethazine  Assessment/Plan:  Principal Problem:  *Fracture of femoral neck, right Active Problems:  Diastolic CHF, acute on chronic  DEPRESSION  HYPERTENSION  FIBRILLATION, ATRIAL  CEREBROVASCULAR ACCIDENT  GERD (gastroesophageal reflux disease)  Diastolic CHF, acute  Leukocytosis  Hypokalemia  Femoral neck fracture  #1 Femoral Neck Fracture: Postop day 1. Management as per orthopedics (Dr. Luiz Blare).  #2 Acute on Chronic Diastolic CHF: ECHO shows EF 65-70% with concentric hypertrophy. Became hypoxemic 2 days ago and a CXR showed new bilateral airspace disease likely representing pulmonary edema. Will continue with diuresis today. Likely received too much volume with IVF and FFP. Continue strict Is and Os. Respiratory status has improved with diuresis. Recheck chest x-ray in the morning.  #3 afib: rate controlled. INR reversed for surgery. Has now been restarted. Pharmacy dosing.  #4 Hypokalemia: secondary to diuresis. Repleted.  #5 Hypomagnesemia: Repleted. Recheck   #6 Dispo: Will likely need SNF at DC.     LOS: 4 days   [facility] Pager: [phone] 11/27/2011, 5:15 PM

## 2011-11-27 NOTE — Progress Notes (Signed)
CARE MANAGEMENT NOTE 11/27/2011  Patient:  [name], [name]   Account Number:  [id number]  Date Initiated:  11/27/2011  Documentation initiated by:  Zoeann Mol  Subjective/Objective Assessment:   pt had orif of hip fx pkm of [phone]     Action/Plan:   snf   Anticipated DC Date:  11/30/2011   Anticipated DC Plan:  SKILLED NURSING FACILITY  In-house referral  Clinical Social Worker      DC Planning Services  NA      [facility] Choice  NA   Choice offered to / List presented to:  NA   DME arranged  NA      DME agency  NA     HH arranged  NA      HH agency  NA   Status of service:  In process, will continue to follow Medicare Important Message given?  NA - LOS <3 / Initial given by admissions (If response is "NO", the following Medicare IM given date fields will be blank) Date Medicare IM given:   Date Additional Medicare IM given:    Discharge Disposition:    Per UR Regulation:  Reviewed for med. necessity/level of care/duration of stay  Comments:  [id number]./Caley Ciaramitaro,RN,BSN,CCM

## 2011-11-28 DIAGNOSIS — S72009A Fracture of unspecified part of neck of unspecified femur, initial encounter for closed fracture: Secondary | ICD-10-CM

## 2011-11-28 DIAGNOSIS — W19XXXA Unspecified fall, initial encounter: Secondary | ICD-10-CM

## 2011-11-28 LAB — BASIC METABOLIC PANEL
Calcium: 9.1 mg/dL (ref 8.4–10.5)
Creatinine, Ser: 0.9 mg/dL (ref 0.50–1.10)
GFR calc non Af Amer: 62 mL/min — ABNORMAL LOW (ref >90)
Glucose, Bld: 107 mg/dL — ABNORMAL HIGH (ref 70–99)
Sodium: 133 mEq/L — ABNORMAL LOW (ref 135–145)

## 2011-11-28 LAB — CBC
MCH: 32.6 pg (ref 26.0–34.0)
MCHC: 34.3 g/dL (ref 30.0–36.0)
Platelets: 209 10*3/uL (ref 150–400)
RBC: 3.25 MIL/uL — ABNORMAL LOW (ref 3.87–5.11)
RDW: 13.5 % (ref 11.5–15.5)

## 2011-11-28 MED ORDER — FUROSEMIDE 20 MG PO TABS
20.0000 mg | ORAL_TABLET | Freq: Every day | ORAL | Status: DC
  Filled 2011-11-28: qty 1

## 2011-11-28 MED ORDER — WARFARIN SODIUM 3 MG PO TABS
3.0000 mg | ORAL_TABLET | Freq: Once | ORAL | Status: AC
  Administered 2011-11-28: 3 mg via ORAL
  Filled 2011-11-28: qty 1

## 2011-11-28 MED ORDER — POTASSIUM CHLORIDE 20 MEQ/15ML (10%) PO LIQD
20.0000 meq | Freq: Two times a day (BID) | ORAL | Status: AC
  Administered 2011-11-28 – 2011-11-29 (×3): 20 meq via ORAL
  Filled 2011-11-28 (×3): qty 15

## 2011-11-28 NOTE — Consult Note (Signed)
Physical Medicine and Rehabilitation Consult Reason for Consult: Hip fracture Referring Phsyician: [facility] hospital[facility] [name] is an 76 y.o. female.   HPI: 76 year old right-handed female with atrial fibrillation on chronic Coumadin as well as seizure disorder and brain tumor removal in the past with craniotomy as well as history of falls who was admitted March 1 when she sustained a fall while going to see her primary care doctor. There was no loss of consciousness. Cranial CT scan was negative for acute changes. X-rays revealed right femoral neck fracture. Noted INR on admission of 3.2 and a loud blood thinning time to drift down and underwent right hip hemiarthroplasty March 5 per Dr. Luiz Blare. Patient is weightbearing as tolerated with questionable hip cautions. Her chronic Coumadin has been resumed. Postoperative hypokalemia 3.2 and monitored with supplement. Hemoglobin remained stable at 10.6.Marland Kitchen patient with noted confusion question premorbid status. Physical therapy has been consulted with recommendations of rehabilitation consult with physical medicine and rehabilitation contacted.  Review of Systems  Cardiovascular: Positive for palpitations.  Musculoskeletal: Positive for falls.  Neurological: Positive for seizures and headaches.  All other systems reviewed and are negative.   Past Medical History  Diagnosis Date  . Depressive disorder, not elsewhere classified   . Atrial fibrillation   . Unspecified transient cerebral ischemia   . Unspecified cerebral artery occlusion with cerebral infarction   . Unspecified essential hypertension   . Diverticulosis of colon (without mention of hemorrhage)   . Stricture and stenosis of esophagus   . Seizure disorder   . GERD (gastroesophageal reflux disease)   . IBS (irritable bowel syndrome)    Past Surgical History  Procedure Date  . Hemorrhoid surgery   . Benign braintumor     Removal  . Flexible sigmoidoscopy 09/02/2011    Procedure:  FLEXIBLE SIGMOIDOSCOPY;  Surgeon: Erick Blinks, MD;  Location: [facility] ENDOSCOPY;  Service: Gastroenterology;  Laterality: N/A;   Family History  Problem Relation Age of Onset  . Heart disease Mother   . Colon cancer Neg Hx    Social History:  reports that she has never smoked. She has never used smokeless tobacco. She reports that she does not drink alcohol or use illicit drugs. Allergies:  Allergies  Allergen Reactions  . Ciprofloxacin     REACTION: diarrhea/yeast infection  . Codeine Other (See Comments)    unknown  . Dilantin Other (See Comments)    unknown  . Keppra Nausea Only    Nausea developed while on this med, worsened when dose increased.  Nausea ceased when med was d/cd.  Marland Kitchen Penicillins Hives   Medications Prior to Admission  Medication Dose Route Frequency Provider Last Rate Last Dose  . 0.9 %  sodium chloride infusion   Intravenous Once [employer]. Rubin Payor, MD 20 mL/hr at 11/23/11 1647    . 0.9 %  sodium chloride infusion   Intravenous Continuous Matthew Folks, [place] 10 mL/hr at 11/28/11 [phone]    . acetaminophen (TYLENOL) tablet 650 mg  650 mg Oral Q6H PRN Matthew Folks, PA       Or  . acetaminophen (TYLENOL) suppository 650 mg  650 mg Rectal Q6H PRN Matthew Folks, PA      . albuterol (PROVENTIL) (5 MG/ML) 0.5% nebulizer solution 2.5 mg  2.5 mg Nebulization Once Einar Pheasant, MD   2.5 mg at 11/26/11 1830  . albuterol (PROVENTIL) (5 MG/ML) 0.5% nebulizer solution        2.5 mg at 11/24/11 1417  .  alum & mag hydroxide-simeth (MAALOX/MYLANTA) 200-200-20 MG/5ML suspension 30 mL  30 mL Oral Q4H PRN Matthew Folks, PA      . amLODipine (NORVASC) tablet 10 mg  10 mg Oral BID Matthew Folks, PA   10 mg at 11/27/11 2146  . aspirin EC tablet 81 mg  81 mg Oral Daily Chaya Jan, MD   81 mg at 11/27/11 0916  . barrier cream (non-specified) 1 application  1 application Topical TID PRN Manson Passey, MD      . carbamazepine (TEGRETOL) tablet 200 mg  200 mg Oral BID  Manson Passey, MD   200 mg at 11/27/11 2146  . Chlorhexidine Gluconate Cloth 2 % PADS 6 each  6 each Topical Q0600 Chaya Jan, MD   6 each at 11/28/11 0600  . cholecalciferol (VITAMIN D) tablet 4,000 Units  4,000 Units Oral Daily Manson Passey, MD   4,000 Units at 11/27/11 1027  . citalopram (CELEXA) tablet 20 mg  20 mg Oral Daily Manson Passey, MD   20 mg at 11/27/11 1028  . clindamycin (CLEOCIN) IVPB 600 mg  600 mg Intravenous Q6H Matthew Folks, PA   600 mg at 11/27/11 0802  . conjugated estrogens (PREMARIN) vaginal cream 0.25 Applicatorful  2 g Vaginal Daily Manson Passey, MD   0.25 Applicatorful at 11/27/11 1028  . docusate sodium (COLACE) capsule 100 mg  100 mg Oral BID Matthew Folks, PA   100 mg at 11/27/11 2146  . estradiol (ESTRACE) vaginal cream 0.25 Applicatorful  2 g Vaginal 3 times weekly Chaya Jan, MD      . fentaNYL (SUBLIMAZE) injection 25 mcg  25 mcg Intravenous Once Grant Fontana, [place]   25 mcg at 11/23/11 1647  . fentaNYL (SUBLIMAZE) injection 25 mcg  25 mcg Intravenous Once Grant Fontana, [place]   25 mcg at 11/23/11 1736  . ferrous sulfate tablet 325 mg  325 mg Oral BID WC Matthew Folks, PA   325 mg at 11/27/11 1721  . furosemide (LASIX) injection 20 mg  20 mg Intravenous Once Chaya Jan, MD   20 mg at 11/25/11 1610  . furosemide (LASIX) injection 20 mg  20 mg Intravenous Once Einar Pheasant, MD   20 mg at 11/26/11 1820  . furosemide (LASIX) injection 60 mg  60 mg Intravenous Once Chaya Jan, MD   60 mg at 11/24/11 1557  . furosemide (LASIX) tablet 20 mg  20 mg Oral Daily Brendia Sacks, MD      . hyoscyamine (LEVSIN SL) SL tablet 0.125 mg  0.125 mg Sublingual Q4H PRN Manson Passey, MD      . lamoTRIgine (LAMICTAL) tablet 25 mg  25 mg Oral BID Manson Passey, MD   25 mg at 11/27/11 2147  . lisinopril (PRINIVIL,ZESTRIL) tablet 40 mg  40 mg Oral Daily Manson Passey, MD   40 mg at 11/27/11 1027  . LORazepam (ATIVAN) tablet 0.5 mg   0.5 mg Oral QHS PRN Manson Passey, MD      . magic mouthwash  15 mL Oral TID Manson Passey, MD   15 mL at 11/27/11 2146  . magnesium sulfate IVPB 2 g 50 mL  2 g Intravenous Once Chaya Jan, MD   2 g at 11/26/11 0912  . metoprolol tartrate (LOPRESSOR) tablet 100 mg  100 mg Oral BID Manson Passey, MD   100 mg at 11/27/11 2146  . mupirocin ointment (BACTROBAN) 2 % 1 application  1 application  Nasal BID Chaya Jan, MD   1 application at 11/27/11 2200  . nitrofurantoin (macrocrystal-monohydrate) (MACROBID) capsule 100 mg  100 mg Oral Q12H Manson Passey, MD   100 mg at 11/27/11 2146  . nystatin (NYSTOP) topical powder 100,000 Units  1 g Topical TID Manson Passey, MD   100,000 Units at 11/27/11 2200  . ondansetron (ZOFRAN) tablet 4 mg  4 mg Oral Q6H PRN Matthew Folks, PA       Or  . ondansetron [facility]) injection 4 mg  4 mg Intravenous Q6H PRN Matthew Folks, PA      . ondansetron [facility]) tablet 4 mg  4 mg Oral QHS Manson Passey, MD   4 mg at 11/27/11 2147  . oxyCODONE-acetaminophen (PERCOCET) 5-325 MG per tablet 1-2 tablet  1-2 tablet Oral Q4H PRN Matthew Folks, PA   2 tablet at 11/27/11 [phone]  . pantoprazole (PROTONIX) EC tablet 40 mg  40 mg Oral Daily Manson Passey, MD   40 mg at 11/27/11 1027  . phytonadione (VITAMIN K) 5 mg in dextrose 5 % 50 mL IVPB  5 mg Intravenous Once Chaya Jan, MD   5 mg at 11/24/11 1232  . potassium chloride 10 mEq in 100 mL IVPB  10 mEq Intravenous Q1 Hr x 4 Estela Philip Aspen, MD   10 mEq at 11/25/11 1241  . potassium chloride 20 MEQ/15ML (10%) liquid 20 mEq  20 mEq Oral BID Brendia Sacks, MD      . potassium chloride SA (K-DUR,KLOR-CON) CR tablet 20 mEq  20 mEq Oral Once Manson Passey, MD   20 mEq at 11/23/11 1858  . saccharomyces boulardii (FLORASTOR) capsule 250 mg  250 mg Oral BID Manson Passey, MD   250 mg at 11/27/11 2146  . traMADol (ULTRAM) tablet 50 mg  50 mg Oral Q6H PRN Matthew Folks, PA      . warfarin (COUMADIN) tablet 4 mg   4 mg Oral ONCE-1800 Estela Philip Aspen, MD   4 mg at 11/27/11 1721  . warfarin (COUMADIN) tablet 5 mg  5 mg Oral Once Chaya Jan, MD   5 mg at 11/26/11 2143  . Warfarin - Pharmacist Dosing Inpatient   Does not apply [id number] Chaya Jan, MD      . DISCONTD: 0.9 %  sodium chloride infusion   Intravenous STAT Grant Fontana, [place]      . DISCONTD: 0.9 %  sodium chloride infusion   Intravenous Continuous Manson Passey, MD 50 mL/hr at 11/24/11 1058    . DISCONTD: amLODipine (NORVASC) tablet 10 mg  10 mg Oral Daily Manson Passey, MD   10 mg at 11/26/11 1037  . DISCONTD: aspirin EC tablet 81 mg  81 mg Oral Daily Manson Passey, MD      . DISCONTD: aspirin EC tablet 81 mg  81 mg Oral Daily Chaya Jan, MD      . DISCONTD: bupivacaine (MARCAINE) 0.5 % (with pres) injection    PRN Harvie Junior, MD   20 mL at 11/26/11 1649  . DISCONTD: Chlorhexidine Gluconate Cloth 2 % PADS 6 each  6 each Topical Q0600 Chaya Jan, MD      . DISCONTD: fentaNYL (SUBLIMAZE) injection 25-50 mcg  25-50 mcg Intravenous Q5 min PRN Einar Pheasant, MD      . DISCONTD: fentaNYL (SUBLIMAZE) injection 50 mcg  50 mcg Intravenous Q2H PRN Grant Fontana, PA   50 mcg at 11/23/11 2320  . DISCONTD:  furosemide (LASIX) injection 20 mg  20 mg Intravenous BID Chaya Jan, MD   20 mg at 11/25/11 1610  . DISCONTD: furosemide (LASIX) injection 20 mg  20 mg Intravenous Once Einar Pheasant, MD      . DISCONTD: furosemide (LASIX) injection 40 mg  40 mg Intravenous BID Chaya Jan, MD   40 mg at 11/27/11 1721  . DISCONTD: labetalol (NORMODYNE,TRANDATE) injection 20 mg  20 mg Intravenous Once Einar Pheasant, MD      . DISCONTD: lactated ringers infusion   Intravenous Continuous Einar Pheasant, MD      . DISCONTD: mupirocin ointment (BACTROBAN) 2 % 1 application  1 application Nasal BID Chaya Jan, MD      . DISCONTD: ondansetron [facility]) injection  4 mg  4 mg Intravenous Q6H PRN Chaya Jan, MD   4 mg at 11/24/11 1143  . DISCONTD: potassium chloride 10 mEq in 100 mL IVPB  10 mEq Intravenous Q1 Hr x 5 Estela Philip Aspen, MD   10 mEq at 11/26/11 1300  . DISCONTD: promethazine (PHENERGAN) injection 6.25-12.5 mg  6.25-12.5 mg Intravenous Q15 min PRN Einar Pheasant, MD      . DISCONTD: sodium chloride 0.9 % injection 3 mL  3 mL Intravenous Q12H Manson Passey, MD   3 mL at 11/26/11 1048  . DISCONTD: Warfarin - Pharmacist Dosing Inpatient   Does not apply q1800 Loma Messing [place], PHARMD      . DISCONTD: Warfarin - Pharmacist Dosing Inpatient   Does not apply q1800 Loma Messing [place], PHARMD       Medications Prior to Admission  Medication Sig Dispense Refill  . amLODipine (NORVASC) 10 MG tablet Take 10 mg by mouth 2 (two) times daily.      . barrier cream (NON-SPECIFIED) CREA Apply 1 application topically 3 (three) times daily as needed.  1 each  1  . carbamazepine (TEGRETOL) 200 MG tablet Take 200 mg by mouth 2 (two) times daily.        . cholecalciferol (VITAMIN D) 1000 UNITS tablet Take 4,000 Units by mouth daily.        Marland Kitchen conjugated estrogens (PREMARIN) vaginal cream Place 30 g vaginally daily.        Marland Kitchen estradiol (ESTRACE) 0.1 MG/GM vaginal cream Place 2 g vaginally 3 (three) times a week.        . hyoscyamine (LEVSIN SL) 0.125 MG SL tablet Place 0.125 mg under the tongue every 4 (four) hours as needed. For stomach cramps       . lamoTRIgine (LAMICTAL) 25 MG tablet Take 25 mg by mouth 2 (two) times daily.        Marland Kitchen lisinopril (PRINIVIL,ZESTRIL) 40 MG tablet Take 40 mg by mouth daily.       . metoprolol (LOPRESSOR) 100 MG tablet Take 1 tablet (100 mg total) by mouth 2 (two) times daily.  1 tablet  1  . ondansetron (ZOFRAN) 4 MG tablet Take 4 mg by mouth at bedtime.       . pantoprazole (PROTONIX) 40 MG tablet Take 40 mg by mouth daily.        Marland Kitchen saccharomyces boulardii (FLORASTOR) 250 MG capsule Take 250 mg by  mouth 2 (two) times daily.        . Alum & Mag Hydroxide-Simeth (MAGIC MOUTHWASH) SOLN Take 15 mLs by mouth 3 (three) times daily.  15 mL  1  . nystatin (NYSTOP) 100000 UNIT/GM POWD Apply 1 g (100,000  Units total) topically 3 (three) times daily. Apply to affected back,thigh-peri rectal skin  1 Bottle  1    Home: Home Living Lives With: Spouse Type of Home: House Home Layout: One level Home Access: Stairs to enter Entrance Stairs-Rails: Right Entrance Stairs-Number of Steps: 4 Bathroom Shower/Tub: Banker: Shower chair with back  Functional History: Prior Function Level of Independence: Independent with basic ADLs;Independent with transfers;Independent with gait (without device, but h/o falls per spouse) Functional Status:  Mobility: Bed Mobility Bed Mobility: Yes Supine to Sit: 1: +2 Total assist Supine to Sit Details (indicate cue type and reason): pt=10% with cues and increased time for technique; ? slow processing vs. weak Sitting - Scoot to Edge of Bed: 2: Max assist Sitting - Scoot to [employer] of Bed Details (indicate cue type and reason): scooted forward on pad under her Sit to Supine: 1: +2 Total assist Sit to Supine - Details (indicate cue type and reason): pt=10%, lifted legs and pivoted patient into bed Transfers Transfers: Yes Sit to Stand: 1: +2 Total assist;From chair/3-in-1;With armrests Sit to Stand Details (indicate cue type and reason): cues and assist to use armrests, pt=20%, unable to stand to pivot with RW, so sat back on chair Stand to Sit: 1: +2 Total assist;To chair/3-in-1 Stand to Sit Details: lowered to chair pt dependent Stand Pivot Transfers: 1: +2 Total assist Stand Pivot Transfer Details (indicate cue type and reason): pt intially 20%, then dependent due to decreased responsiveness.  BP 141/80; HR 90's and RN reports stable on monitor, SpO2 90% on 3 L O2; RN to monitor for signs of hypoglycemia Squat Pivot Transfers: 1: +2  Total assist Squat Pivot Transfer Details (indicate cue type and reason): pt<10%, weakness limited participation      ADL:    Cognition: Cognition Arousal/Alertness: Awake/alert Orientation Level: Oriented to person;Oriented to place;Disoriented to time;Disoriented to situation Cognition Arousal/Alertness: Awake/alert Overall Cognitive Status: Difficult to assess Difficult to assess due to: impaired communication (expressive aphasia) Orientation Level: Oriented to person;Oriented to place;Disoriented to time;Disoriented to situation  Blood pressure 181/74, pulse 96, temperature 97.8 F (36.6 C), temperature source Oral, resp. rate 20, height 5\' 2"  (1.575 m), weight 73 kg (160 lb 15 oz), SpO2 96.00%. Physical Exam  Vitals reviewed. Constitutional: She appears well-developed.       Pleasant but frail-appearing lady  HENT:  Head: Normocephalic.  Neck: Normal range of motion. Neck supple. No thyromegaly present.  Cardiovascular:       Rate control  Pulmonary/Chest: Breath sounds normal. She has no wheezes.  Abdominal: She exhibits no distension. There is no tenderness.  Musculoskeletal: She exhibits no edema.       Right hip tender to any sort of movement or palpation. Hip is dressed. She does move distal limbs without restriction.  Neurological: She is alert. She has normal reflexes. No cranial nerve deficit or sensory deficit.       Patient needed multiple cues for place date of birth age and living situation. She was a poor historian. She followed simple one-step commands but lacks insight and awareness  as a whole.  Skin:       Hip incision clean and dry  Psychiatric:       Patient with poor safety awareness and judgment    Results for orders placed during the hospital encounter of 11/23/11 (from the past 24 hour(s))  PROTIME-INR     Status: Abnormal   Collection Time   11/28/11  4:48 AM  Component Value Range   Prothrombin Time 17.8 (*) 11.6 - 15.2 (seconds)   INR  1.44  0.00 - 1.49   BASIC METABOLIC PANEL     Status: Abnormal   Collection Time   11/28/11  4:48 AM      Component Value Range   Sodium 133 (*) 135 - 145 (mEq/L)   Potassium 3.2 (*) 3.5 - 5.1 (mEq/L)   Chloride 93 (*) 96 - 112 (mEq/L)   CO2 30  19 - 32 (mEq/L)   Glucose, Bld 107 (*) 70 - 99 (mg/dL)   BUN 27 (*) 6 - 23 (mg/dL)   Creatinine, Ser 1.61  0.50 - 1.10 (mg/dL)   Calcium 9.1  8.4 - 09.6 (mg/dL)   GFR calc non Af Amer 62 (*) >90 (mL/min)   GFR calc Af Amer 72 (*) >90 (mL/min)  CBC     Status: Abnormal   Collection Time   11/28/11  5:00 AM      Component Value Range   WBC 8.7  4.0 - 10.5 (K/uL)   RBC 3.25 (*) 3.87 - 5.11 (MIL/uL)   Hemoglobin 10.6 (*) 12.0 - 15.0 (g/dL)   HCT 04.5 (*) 40.9 - 46.0 (%)   MCV 95.1  78.0 - 100.0 (fL)   MCH 32.6  26.0 - 34.0 (pg)   MCHC 34.3  30.0 - 36.0 (g/dL)   RDW 81.1  91.4 - 78.2 (%)   Platelets 209  150 - 400 (K/uL)  GLUCOSE, CAPILLARY     Status: Abnormal   Collection Time   11/28/11  7:39 AM      Component Value Range   Glucose-Capillary 112 (*) 70 - 99 (mg/dL)   Dg Pelvis Portable  11/26/2011  *RADIOLOGY REPORT*  Clinical Data: Postoperative for right total hip replacement.  PORTABLE PELVIS  Comparison: 11/23/2011  Findings: A bipolar right hip prosthesis is in place, without fracture or complicating feature observed.  Expected gas noted in the soft tissues.  IMPRESSION:  1.  Right hip bipolar prosthesis noted, without complicating feature.  Original Report Authenticated By: Dellia Cloud, M.D.   Dg Chest Port 1 View  11/26/2011  *RADIOLOGY REPORT*  Clinical Data: Post Anesthesia breathing problems.  PORTABLE CHEST - 1 VIEW  Comparison: 11/24/2011  Findings: Bilateral lower lobe airspace opacities are present, mildly increased in the left lower lobe but reduced in the right lower lobe.  Underlying interstitial accentuation is present.  Atherosclerotic calcification of the aortic arch is noted.  Mild cardiomegaly is present.   IMPRESSION:  1.  Bibasilar airspace opacities, increased on the left but reduced on the right compared to the exam from 2 days ago.  There is mild underlying interstitial accentuation is well.  Differential diagnostic considerations include edema and pneumonia. 2.  Atherosclerosis. 3.  Mild cardiomegaly.  Original Report Authenticated By: Dellia Cloud, M.D.    Assessment/Plan: Diagnosis: Right femoral neck fracture 1. Does the need for close, 24 hr/day medical supervision in concert with the patient's rehab needs make it unreasonable for this patient to be served in a less intensive setting? No 2. Co-Morbidities requiring supervision/potential complications: See above 3. Due to bladder management, bowel management and skin/wound care, does the patient require 24 hr/day rehab nursing? No 4. Does the patient require coordinated care of a physician, rehab nurse, PT (1-2 hrs/day, 5 days/week) and OT (1-2 hrs/day, 5 days/week) to address physical and functional deficits in the context of the above medical diagnosis(es)? No Addressing deficits in the following  areas: balance, endurance, dressing and toileting 5. Can the patient actively participate in an intensive therapy program of at least 3 hrs of therapy per day at least 5 days per week? No 6. The potential for patient to make measurable gains while on inpatient rehab is fair 7. Anticipated functional outcomes upon discharge from inpatients are not applicable 8. Estimated rehab length of stay to reach the above functional goals is: Not applicable 9. Does the patient have adequate social supports to accommodate these discharge functional goals? Not applicable 10. Anticipated D/C setting: Not app 11. Anticipated post D/C treatments: HH therapy 12. Overall Rehab/Functional Prognosis: good  RECOMMENDATIONS: This patient's condition is appropriate for continued rehabilitative care in the following setting: SNF Patient has agreed to participate  in recommended program. Potentially Note that insurance prior authorization may be required for reimbursement for recommended care.  Comment: Patient with premorbid gait disorder and frequent falls. I don't feel that she would be able to tolerate the intensity of inpatient rehabilitation. She tended to agree as well. Recommend skilled nursing facility   Ranelle Oyster M.D. 11/28/2011

## 2011-11-28 NOTE — Progress Notes (Signed)
D:  At 1100 O2 decreased to 1 lpm via [place] in attempt to wean O2.  Pt O2 sat decreased to 87% on 1 lpm.  A: O2 increased to 2 lpm via [place]. R: O2 sat increased to 97%.  Will reattempt to wean O2 later this pm. Maeola Harman

## 2011-11-28 NOTE — Progress Notes (Signed)
CSW spoke with patient's husband, Oralee Rapaport (home#: [phone]) re: discharge planning. Noted PT recommending ST-SNF before returning home. CSW completed FL2 and faxed out to [facility], husband stated patient had been to [facility] earlier this year and would like to return there at discharge. CSW left message for [facility], waiting to hear back.   Unice Bailey, LCSWA [phone]

## 2011-11-28 NOTE — Progress Notes (Signed)
O2 sat 96% on 1lpm via [place].  Will continue to monitor O2 sat. [name]

## 2011-11-28 NOTE — Progress Notes (Signed)
ANTICOAGULATION CONSULT NOTE - Follow Up Consult  Pharmacy Consult for Warfarin Indication: atrial fibrillation, s/p THR  Labs:  Basename 11/28/11 0500 11/28/11 0448 11/27/11 0443 11/27/11 0430 11/26/11 0458  HGB 10.6* -- -- 10.5* --  HCT 30.9* -- -- 30.6* --  PLT 209 -- -- 190 --  APTT -- -- -- -- --  LABPROT -- 17.8* -- 14.2 14.1  INR -- 1.44 -- 1.08 1.07  HEPARINUNFRC -- -- -- -- --  CREATININE -- 0.90 1.04 -- 0.99  CKTOTAL -- -- -- -- --  CKMB -- -- -- -- --  TROPONINI -- -- -- -- --   Medications:  Scheduled:     . amLODipine  10 mg Oral BID  . aspirin EC  81 mg Oral Daily  . carbamazepine  200 mg Oral BID  . Chlorhexidine Gluconate Cloth  6 each Topical Q0600  . cholecalciferol  4,000 Units Oral Daily  . citalopram  20 mg Oral Daily  . conjugated estrogens  2 g Vaginal Daily  . docusate sodium  100 mg Oral BID  . estradiol  2 g Vaginal 3 times weekly  . ferrous sulfate  325 mg Oral BID WC  . furosemide  20 mg Oral Daily  . lamoTRIgine  25 mg Oral BID  . lisinopril  40 mg Oral Daily  . magic mouthwash  15 mL Oral TID  . metoprolol  100 mg Oral BID  . mupirocin ointment  1 application Nasal BID  . nitrofurantoin (macrocrystal-monohydrate)  100 mg Oral Q12H  . nystatin  1 g Topical TID  . ondansetron  4 mg Oral QHS  . pantoprazole  40 mg Oral Daily  . potassium chloride  20 mEq Oral BID  . saccharomyces boulardii  250 mg Oral BID  . warfarin  4 mg Oral ONCE-1800  . Warfarin - Pharmacist Dosing Inpatient   Does not apply q1800  . DISCONTD: furosemide  40 mg Intravenous BID  . DISCONTD: labetalol  20 mg Intravenous Once   Warfarin PTA dose 2mg  on M,F; 4mg  other days  Assessment:  76 yo F s/p R THR.  Chronic Coumadin for Afib, INR reversed with FFP, VitK 5mg  IV for surgery  Ortho note states aim for INR 2, will suggest 2-2.5; low end of therapeutic range as pt on chronic Warfarin for Afib  Home dose = 4 mg daily except 2 mg on Mon/Fri  INR 1.44 s/p 2  doses. Big jump in INR last night.  Hgb stable  Goal of Therapy:  (INR 2-3 for Afib), goal post-op aiming for 2-2.5   Plan:   Coumadin 3 mg po x 1 tonight.    Pharmacy will f/u daily   Geoffry Paradise, PharmD.   Pager:  [phone] 1:31 PM

## 2011-11-28 NOTE — Progress Notes (Addendum)
Subjective: 2 Days Post-Op Procedure(s) (LRB): ARTHROPLASTY BIPOLAR HIP (Right) Patient reports pain as 2 on 0-10 scale.   Sitting up in bed eating breakfast.  Objective: Vital signs in last 24 hours: Temp:  [97.8 F (36.6 C)-98 F (36.7 C)] 97.8 F (36.6 C) (03/06 0981) Pulse Rate:  [82-96] 96  (03/06 0637) Resp:  [16-20] 20  (03/06 0637) BP: (125-181)/(74-77) 181/74 mmHg (03/06 0637) SpO2:  [90 %-96 %] 93 % (03/06 1100) Weight:  [73 kg (160 lb 15 oz)] 73 kg (160 lb 15 oz) (03/06 0637)  Intake/Output from previous day: 03/05 0701 - 03/06 0700 In: 840 [P.O.:600; I.V.:240] Out: 2150 [Urine:2150] Intake/Output this shift: Total I/O In: 120 [P.O.:120] Out: 200 [Urine:200]   Basename 11/28/11 0500 11/27/11 0430  HGB 10.6* 10.5*    Basename 11/28/11 0500 11/27/11 0430  WBC 8.7 8.7  RBC 3.25* 3.22*  HCT 30.9* 30.6*  PLT 209 190    Basename 11/28/11 0448 11/27/11 0443  NA 133* 136  K 3.2* 3.4*  CL 93* 96  CO2 30 32  BUN 27* 28*  CREATININE 0.90 1.04  GLUCOSE 107* 124*  CALCIUM 9.1 8.9    Basename 11/28/11 0448 11/27/11 0430  LABPT -- --  INR 1.44 1.08  Right hip:  Neurovascular intact Sensation intact distally Intact pulses distally Dorsiflexion/Plantar flexion intact Incision: dressing C/D/I Compartment soft  Assessment/Plan: 2 Days Post-Op Procedure(s) (LRB): ARTHROPLASTY BIPOLAR HIP (Right) Plan: Up with therapy Discharge to SNF when medically stable.  Ready from ortho viewpoint when bed available. Cont po coumadin.  Marqueta Pulley G 11/28/2011, 11:47 AM

## 2011-11-28 NOTE — Progress Notes (Signed)
PROGRESS NOTE  [name] ZOX:[id number] DOB: 11/19/1935 DOA: 11/23/2011 PCP: Gweneth Dimitri, MD, MD  Brief narrative: 76 year old woman status post fall sustaining a hip fracture.  Past medical history:   Consultants:  Orthopedics  Procedures:  March 2: 2-D echocardiogram: Left ventricular ejection fraction 60-65%. Normal wall motion.  March 5: Right hemiarthroplasty  Antibiotics:  None  Interim History: Currently on 3 L per minute nasal cannula. Discussed with RN: No concerns at this time.  Subjective: Complains of hip otherwise no complaints. Breathing well.  Objective: Filed Vitals:   11/27/11 2152 11/28/11 0000 11/28/11 0400 11/28/11 0637  BP: 144/75   181/74  Pulse: 89   96  Temp: 98 F (36.7 C)   97.8 F (36.6 C)  TempSrc: Oral   Oral  Resp: 18 20 18 20   Height:      Weight:    73 kg (160 lb 15 oz)  SpO2: 94% 95% 96% 96%    Intake/Output Summary (Last 24 hours) at 11/28/11 0847 Last data filed at 11/28/11 0654  Gross per 24 hour  Intake    720 ml  Output   2150 ml  Net  -1430 ml    Exam:   General:  Appears calm and comfortable.  Cardiovascular: Irregular rhythm. Normal rate. No murmur rub or gallop. No lower extremity edema of note.  Telemetry: Atrial fibrillation. Rate control. No significant arrhythmias.  Respiratory: Clear to auscultation bilaterally. No wheezes, rales, rhonchi. Normal respiratory effort.  Psychiatric: Grossly normal mood and affect. Speech fluent and appropriate.  Data Reviewed: Basic Metabolic Panel:  Lab 11/28/11 8119 11/27/11 1478 11/26/11 0458 11/25/11 0610 11/24/11 0421 11/23/11 2024  NA 133* 136 137 141 137 --  K 3.2* 3.4* -- -- -- --  CL 93* 96 95* 102 102 --  CO2 30 32 34* 31 28 --  GLUCOSE 107* 124* 113* 123* 129* --  BUN 27* 28* 23 19 14  --  CREATININE 0.90 1.04 0.99 1.09 0.76 --  CALCIUM 9.1 8.9 9.0 9.1 8.9 --  MG -- 2.0 -- 1.5 -- 1.4*  PHOS -- -- -- -- -- 2.6   Liver Function Tests:  Lab  11/24/11 0421 11/23/11 2024 11/23/11 1600  AST 14 16 17   ALT 11 13 14   ALKPHOS 63 71 79  BILITOT 0.3 0.3 0.3  PROT 6.0 6.8 7.4  ALBUMIN 3.1* 3.5 4.0   CBC:  Lab 11/28/11 0500 11/27/11 0430 11/25/11 0610 11/24/11 0421 11/23/11 2024 11/23/11 1600  WBC 8.7 8.7 11.0* 10.9* 14.7* --  NEUTROABS -- -- -- -- 12.7* 11.4*  HGB 10.6* 10.5* 11.9* 12.1 13.1 --  HCT 30.9* 30.6* 34.9* 34.2* 37.4 --  MCV 95.1 95.0 94.8 94.2 93.7 --  PLT 209 190 185 217 268 --   Cardiac Enzymes:  Lab 11/24/11 0421 11/23/11 2020  CKTOTAL 183* 80  CKMB 2.4 2.6  CKMBINDEX -- --  TROPONINI <0.30 <0.30   CBG:  Lab 11/28/11 0739 11/26/11 1359 11/26/11 0742 11/25/11 0747 11/25/11 0638  GLUCAP 112* 95 114* 118* 126*    Recent Results (from the past 240 hour(s))  CULTURE, BLOOD (ROUTINE X 2)     Status: Normal (Preliminary result)   Collection Time   11/23/11  7:00 PM      Component Value Range Status Comment   Specimen Description BLOOD LEFT ARM   Final    Special Requests BOTTLES DRAWN AEROBIC AND ANAEROBIC [facility]   Final    Culture  Setup Time [id number]   Final  Culture     Final    Value:        BLOOD CULTURE RECEIVED NO GROWTH TO DATE CULTURE WILL BE HELD FOR 5 DAYS BEFORE ISSUING A FINAL NEGATIVE REPORT   Report Status PENDING   Incomplete   CULTURE, BLOOD (ROUTINE X 2)     Status: Normal (Preliminary result)   Collection Time   11/23/11  7:05 PM      Component Value Range Status Comment   Specimen Description BLOOD LEFT HAND   Final    Special Requests BOTTLES DRAWN AEROBIC AND ANAEROBIC 6C   Final    Culture  Setup Time [id number]   Final    Culture     Final    Value:        BLOOD CULTURE RECEIVED NO GROWTH TO DATE CULTURE WILL BE HELD FOR 5 DAYS BEFORE ISSUING A FINAL NEGATIVE REPORT   Report Status PENDING   Incomplete   URINE CULTURE     Status: Normal   Collection Time   11/23/11  7:35 PM      Component Value Range Status Comment   Specimen Description URINE, CATHETERIZED   Final    Special  Requests NONE   Final    Culture  Setup Time [id number]   Final    Colony Count 15,000 COLONIES/ML   Final    Culture     Final    Value: Multiple bacterial morphotypes present, none predominant. Suggest appropriate recollection if clinically indicated.   Report Status 11/26/2011 FINAL   Final   MRSA PCR SCREENING     Status: Abnormal   Collection Time   11/24/11  6:39 AM      Component Value Range Status Comment   MRSA by PCR POSITIVE (*) NEGATIVE  Final      Studies: Ct Head Wo Contrast  11/23/2011  *RADIOLOGY REPORT*  Clinical Data: Fall.  History of stroke.  CT HEAD WITHOUT CONTRAST  Technique:  Contiguous axial images were obtained from the base of the skull through the vertex without contrast.  Comparison: 08/27/2011  Findings: There is significant central cortical atrophy. Periventricular white matter changes are moderate to severe and appears stable.  Left porencephalic change again identified, associated with prior left frontal craniotomy.  There is no evidence for hemorrhage, mass lesion, or acute infarction.  No evidence for acute calvarial fracture.  Visualized paranasal sinuses and mastoid air cells are clear.  IMPRESSION:  1.  Left frontal craniotomy changes associated porencephaly. 2.  Atrophy and small vessel disease. 3. No evidence for acute intracranial abnormality.  Original Report Authenticated By: Patterson Hammersmith, M.D.   Dg Pelvis Portable  11/26/2011  *RADIOLOGY REPORT*  Clinical Data: Postoperative for right total hip replacement.  PORTABLE PELVIS  Comparison: 11/23/2011  Findings: A bipolar right hip prosthesis is in place, without fracture or complicating feature observed.  Expected gas noted in the soft tissues.  IMPRESSION:  1.  Right hip bipolar prosthesis noted, without complicating feature.  Original Report Authenticated By: Dellia Cloud, M.D.   Dg Chest Port 1 View  11/26/2011  *RADIOLOGY REPORT*  Clinical Data: Post Anesthesia breathing problems.  PORTABLE  CHEST - 1 VIEW  Comparison: 11/24/2011  Findings: Bilateral lower lobe airspace opacities are present, mildly increased in the left lower lobe but reduced in the right lower lobe.  Underlying interstitial accentuation is present.  Atherosclerotic calcification of the aortic arch is noted.  Mild cardiomegaly is present.  IMPRESSION:  1.  Bibasilar airspace opacities, increased on the left but reduced on the right compared to the exam from 2 days ago.  There is mild underlying interstitial accentuation is well.  Differential diagnostic considerations include edema and pneumonia. 2.  Atherosclerosis. 3.  Mild cardiomegaly.  Original Report Authenticated By: Dellia Cloud, M.D.   Dg Chest Port 1 View  11/24/2011  *RADIOLOGY REPORT*  Clinical Data: Hypoxia  PORTABLE CHEST - 1 VIEW  Comparison: 11/23/2011  Findings: Interval development of extensive bibasilar airspace disease.  This may be due to pneumonia or edema.  Heart size mildly enlarged.  Possible vascular congestion.  No significant effusion.  IMPRESSION: Interval development of diffuse bilateral airspace disease most likely due to pulmonary edema however pneumonia could have this appearance.  Original Report Authenticated By: Camelia Phenes, M.D.    Scheduled Meds:   . amLODipine  10 mg Oral BID  . aspirin EC  81 mg Oral Daily  . carbamazepine  200 mg Oral BID  . Chlorhexidine Gluconate Cloth  6 each Topical Q0600  . cholecalciferol  4,000 Units Oral Daily  . citalopram  20 mg Oral Daily  . conjugated estrogens  2 g Vaginal Daily  . docusate sodium  100 mg Oral BID  . estradiol  2 g Vaginal 3 times weekly  . ferrous sulfate  325 mg Oral BID WC  . furosemide  40 mg Intravenous BID  . labetalol  20 mg Intravenous Once  . lamoTRIgine  25 mg Oral BID  . lisinopril  40 mg Oral Daily  . magic mouthwash  15 mL Oral TID  . metoprolol  100 mg Oral BID  . mupirocin ointment  1 application Nasal BID  . nitrofurantoin (macrocrystal-monohydrate)   100 mg Oral Q12H  . nystatin  1 g Topical TID  . ondansetron  4 mg Oral QHS  . pantoprazole  40 mg Oral Daily  . saccharomyces boulardii  250 mg Oral BID  . warfarin  4 mg Oral ONCE-1800  . Warfarin - Pharmacist Dosing Inpatient   Does not apply q1800   Continuous Infusions:   . sodium chloride 10 mL/hr (11/26/11 2032)     Assessment/Plan: 1. Femoral neck fracture: Status post repair. Physical therapy. Skilled nursing facility. 2. Acute/chronic diastolic dose of heart failure: Appears resolved. Recent chest x-ray demonstrated bilateral airspace disease thought to be pulmonary edema. Thought to be secondary to IV fluids and FFP. Change Lasix to oral. Lasix today. Consider discontinuing the near future. Wean oxygen. 3. Postoperative anemia: Stable. 4. Atrial fibrillation: Rate controlled. INR reversed for surgery. Warfarin restarted. INR 1.44 today. Continue metoprolol. 5. Hypokalemia: Replete. 6. Seizure disorder: Appears stable. 7. History of CVA: Appears stable. Continue warfarin.   Code Status: Full code Family Communication: None at bedside Disposition Plan: Skilled nursing facility   Brendia Sacks, MD  [facility] Pager [phone] 11/28/2011, 8:47 AM    LOS: 5 days

## 2011-11-29 LAB — PROTIME-INR: INR: 1.74 — ABNORMAL HIGH (ref 0.00–1.49)

## 2011-11-29 MED ORDER — DILTIAZEM HCL ER COATED BEADS 120 MG PO CP24
120.0000 mg | ORAL_CAPSULE | Freq: Every day | ORAL | Status: DC
  Administered 2011-11-29 – 2011-11-30 (×2): 120 mg via ORAL
  Filled 2011-11-29 (×2): qty 1

## 2011-11-29 MED ORDER — WARFARIN SODIUM 3 MG PO TABS
3.0000 mg | ORAL_TABLET | Freq: Once | ORAL | Status: AC
  Administered 2011-11-29: 3 mg via ORAL
  Filled 2011-11-29: qty 1

## 2011-11-29 NOTE — Progress Notes (Signed)
Subjective: 3 Days Post-Op Procedure(s) (LRB): ARTHROPLASTY BIPOLAR HIP (Right) Patient reports pain as nominal. Taking po ok    Objective: Vital signs in last 24 hours: Temp:  [97.4 F (36.3 C)-98.5 F (36.9 C)] 98.4 F (36.9 C) (03/07 0551) Pulse Rate:  [84-154] 154  (03/07 0959) Resp:  [18-24] 24  (03/07 0959) BP: (130-181)/(78-112) 163/112 mmHg (03/07 0959) SpO2:  [90 %-97 %] 90 % (03/07 0959) Weight:  [69.7 kg (153 lb 10.6 oz)] 69.7 kg (153 lb 10.6 oz) (03/07 0551)  Intake/Output from previous day: 03/06 0701 - 03/07 0700 In: 470 [P.O.:240; I.V.:230] Out: 200 [Urine:200] Intake/Output this shift:     Basename 11/28/11 0500 11/27/11 0430  HGB 10.6* 10.5*    Basename 11/28/11 0500 11/27/11 0430  WBC 8.7 8.7  RBC 3.25* 3.22*  HCT 30.9* 30.6*  PLT 209 190    Basename 11/28/11 0448 11/27/11 0443  NA 133* 136  K 3.2* 3.4*  CL 93* 96  CO2 30 32  BUN 27* 28*  CREATININE 0.90 1.04  GLUCOSE 107* 124*  CALCIUM 9.1 8.9    Basename 11/29/11 0500 11/28/11 0448  LABPT -- --  INR 1.74* 1.44  Pt pleasant moderately alert. Right hip/leg exam:  Neurologically intact Sensation intact distally Intact pulses distally Dorsiflexion/Plantar flexion intact Incision: dressing C/D/I Compartment soft  Assessment/Plan: 3 Days Post-Op Procedure(s) (LRB): ARTHROPLASTY BIPOLAR HIP (Right) PLAN; Discharge to SNF when bed ready. Will need staples removed from right hip in 2 weeks. Cont daily PT WBAT on right with posterior hip precautions. Needs follow up with Dr Luiz Blare in 3  weeks.  [name] G 11/29/2011, 1:42 PM

## 2011-11-29 NOTE — Progress Notes (Signed)
ANTICOAGULATION CONSULT NOTE - Follow Up Consult  Pharmacy Consult for Warfarin Indication: atrial fibrillation, s/p THR  Labs:  Basename 11/29/11 0500 11/28/11 0500 11/28/11 0448 11/27/11 0443 11/27/11 0430  HGB -- 10.6* -- -- 10.5*  HCT -- 30.9* -- -- 30.6*  PLT -- 209 -- -- 190  APTT -- -- -- -- --  LABPROT 20.7* -- 17.8* -- 14.2  INR 1.74* -- 1.44 -- 1.08  HEPARINUNFRC -- -- -- -- --  CREATININE -- -- 0.90 1.04 --  CKTOTAL -- -- -- -- --  CKMB -- -- -- -- --  TROPONINI -- -- -- -- --   Medications:  Scheduled:     . amLODipine  10 mg Oral BID  . aspirin EC  81 mg Oral Daily  . carbamazepine  200 mg Oral BID  . Chlorhexidine Gluconate Cloth  6 each Topical Q0600  . cholecalciferol  4,000 Units Oral Daily  . citalopram  20 mg Oral Daily  . conjugated estrogens  2 g Vaginal Daily  . docusate sodium  100 mg Oral BID  . estradiol  2 g Vaginal 3 times weekly  . ferrous sulfate  325 mg Oral BID WC  . furosemide  20 mg Oral Daily  . lamoTRIgine  25 mg Oral BID  . lisinopril  40 mg Oral Daily  . magic mouthwash  15 mL Oral TID  . metoprolol  100 mg Oral BID  . mupirocin ointment  1 application Nasal BID  . nitrofurantoin (macrocrystal-monohydrate)  100 mg Oral Q12H  . nystatin  1 g Topical TID  . ondansetron  4 mg Oral QHS  . pantoprazole  40 mg Oral Daily  . potassium chloride  20 mEq Oral BID  . saccharomyces boulardii  250 mg Oral BID  . warfarin  3 mg Oral ONCE-1800  . Warfarin - Pharmacist Dosing Inpatient   Does not apply q1800   Warfarin PTA dose 2mg  on M,F; 4mg  other days  Assessment:  76 yo F s/p R THR.  Chronic Coumadin for Afib, INR reversed with FFP, VitK 5mg  IV for surgery  Ortho note states aim for INR 2, will suggest 2-2.5; low end of therapeutic range as pt on chronic Warfarin for Afib  Home dose = 4 mg daily except 2 mg on Mon/Fri  INR continues to progress towards goal 2-3, no bleeding events reported in chart  Goal of Therapy:  (INR 2-3 for  Afib), goal post-op aiming for 2-2.5   Plan:   Repeat Coumadin 3 mg po x 1 tonight.    Pharmacy will f/u daily   Darrol Angel, PharmD Pager: [phone] 11/29/2011 11:36 AM

## 2011-11-29 NOTE — Discharge Instructions (Signed)
Daily PT WBAT on right . Change Right hip dressing every 3rd day.

## 2011-11-29 NOTE — Progress Notes (Signed)
O2 Sats on 1L/[place] 94%, O2Sats at rest on RoomAir 88%

## 2011-11-29 NOTE — Progress Notes (Signed)
Physical Therapy Treatment Patient Details Name: [name] MRN: [id number] DOB: Dec 05, 1935 Today's Date: 11/29/2011  L hemiarthroplasty 2nd fall/fx POD#3 WBAT/THP 9:10 - 9:45 2 ta  PT Assessment/Plan  PT - Assessment/Plan Comments on Treatment Session: Pt more alert and able to recall how she fell.  Stated she didn't feel well today.  Noted resting HR @ 112.  Sat pt EOB + 2 total assist when pt c/o max dizzyness and eyes shifting.  BP was 163/112(125), HR 154, O2 ststs on 1 lt 90%.  No c/o hip pain, just alot of dizzyness and generaly not feeling well.  Reported to RN and returned pt to supine position.  Pt plans to D/C to SNF for Rehab. PT Plan: Discharge plan remains appropriate Follow Up Recommendations: Skilled nursing facility Equipment Recommended: Defer to next venue PT Goals  Acute Rehab PT Goals PT Goal Formulation: With patient Pt will go Supine/Side to Sit: with mod assist PT Goal: Supine/Side to Sit - Progress: Progressing toward goal Pt will go Sit to Supine/Side: with mod assist PT Goal: Sit to Supine/Side - Progress: Progressing toward goal  PT Treatment Precautions/Restrictions  Precautions Precautions: Posterior Hip;Fall Precaution Comments: no order for hip precautions, but noted knee immobilizer and s/p hemiarthroplasty Required Braces or Orthoses: No Knee Immobilizer:  (for hip precautions) Restrictions Weight Bearing Restrictions: No RLE Weight Bearing: Weight bearing as tolerated Other Position/Activity Restrictions: Pt instructed on WBAT Mobility (including Balance) Pt unable to tolerate OOB act 2nd high BP and HR Bed Mobility Bed Mobility: Yes Supine to Sit: 1: +2 Total assist Supine to Sit Details (indicate cue type and reason): Total assist + 2 pt 25% with HOB elevated 45' and increased time Sitting - Scoot to [place] of Bed: 1: +2 Total assist Sitting - Scoot to Edge of Bed Details (indicate cue type and reason): Total assist + 2 pt 10% using pad  under pt to assist Sit to Supine: 1: +2 Total assist Sit to Supine - Details (indicate cue type and reason): Total Assist + 2 pt 25% with pt 5% scooting to [facility] Transfers Transfers: No Ambulation/Gait Ambulation/Gait: No Wheelchair Mobility Wheelchair Mobility: No    Exercise    End of Session PT - End of Session Activity Tolerance: Treatment limited secondary to medical complications (Comment) (HR and BP too high to tolerate OOB act) Nurse Communication: Other (comment) (HR and BP too high to tolerate OOB act) General Behavior During Session: [facility] for tasks performed Cognition: [facility] for tasks performed  Felecia Shelling  PTA [facility]  Acute  Rehab Pager     [phone]

## 2011-11-29 NOTE — Progress Notes (Signed)
PROGRESS NOTE  [name] EAV:[id number] DOB: [birth year] DOA: 11/23/2011 PCP: Gweneth Dimitri, MD, MD  Brief narrative: 76 year old woman who sustained a hip fracture after a mechanical fall.  [name] was admitted to the medical floor and surgery was delayed because of her elevated INR. Prior to surgery she was found to be hypoxic but asymptomatic. Chest x-ray revealed bilateral airspace disease consistent with pulmonary edema. She was treated for acute/chronic diastolic congestive heart failure. She certainly improved and underwent successful right hemiarthroplasty of the hip. Postoperatively she was quite hypoxic and therefore transferred to the ICU.   November 2012: Hospitalization: Pneumonia, C. difficile colitis.  Past medical history: Stroke with residual aphasia, atrial fibrillation on warfarin, depression, seizure disorder  Consultants:  Orthopedics  Physical medicine and rehabilitation: Skilled nursing facility.  Physical therapy: Skilled nursing facility.  Procedures:  March 2: 2-D echocardiogram: Left ventricular ejection fraction 60-65%. Normal wall motion.  March 5: Right hemiarthroplasty  Antibiotics:  None  Interim History: Chart reviewed in detail. Successfully weaned to 1 L per minute nasal cannula. Tachycardic with ambulation.  Subjective: No complaints. Pain well-controlled.  Objective: Filed Vitals:   11/28/11 2123 11/29/11 0000 11/29/11 0400 11/29/11 0551  BP: 181/85   174/85  Pulse: 90   88  Temp: 98.5 F (36.9 C)   98.4 F (36.9 C)  TempSrc: Oral   Oral  Resp: 18 18 18 18   Height:      Weight:    69.7 kg (153 lb 10.6 oz)  SpO2: 96% 95% 95% 93%    Intake/Output Summary (Last 24 hours) at 11/29/11 0735 Last data filed at 11/29/11 0500  Gross per 24 hour  Intake    470 ml  Output    200 ml  Net    270 ml    Exam:   General:  Appears calm and comfortable.  Cardiovascular: Irregular rhythm. Normal rate. No murmur rub or gallop. No  lower extremity edema of note.  Respiratory: Clear to auscultation bilaterally. No wheezes, rales, rhonchi. Normal respiratory effort.  Psychiatric: Grossly normal mood and affect. Speech fluent and appropriate.  Data Reviewed: Basic Metabolic Panel:  Lab 11/28/11 7829 11/27/11 5621 11/26/11 0458 11/25/11 0610 11/24/11 0421 11/23/11 2024  NA 133* 136 137 141 137 --  K 3.2* 3.4* -- -- -- --  CL 93* 96 95* 102 102 --  CO2 30 32 34* 31 28 --  GLUCOSE 107* 124* 113* 123* 129* --  BUN 27* 28* 23 19 14  --  CREATININE 0.90 1.04 0.99 1.09 0.76 --  CALCIUM 9.1 8.9 9.0 9.1 8.9 --  MG -- 2.0 -- 1.5 -- 1.4*  PHOS -- -- -- -- -- 2.6   Liver Function Tests:  Lab 11/24/11 0421 11/23/11 2024 11/23/11 1600  AST 14 16 17   ALT 11 13 14   ALKPHOS 63 71 79  BILITOT 0.3 0.3 0.3  PROT 6.0 6.8 7.4  ALBUMIN 3.1* 3.5 4.0   CBC:  Lab 11/28/11 0500 11/27/11 0430 11/25/11 0610 11/24/11 0421 11/23/11 2024 11/23/11 1600  WBC 8.7 8.7 11.0* 10.9* 14.7* --  NEUTROABS -- -- -- -- 12.7* 11.4*  HGB 10.6* 10.5* 11.9* 12.1 13.1 --  HCT 30.9* 30.6* 34.9* 34.2* 37.4 --  MCV 95.1 95.0 94.8 94.2 93.7 --  PLT 209 190 185 217 268 --   Cardiac Enzymes:  Lab 11/24/11 0421 11/23/11 2020  CKTOTAL 183* 80  CKMB 2.4 2.6  CKMBINDEX -- --  TROPONINI <0.30 <0.30   CBG:  Lab 11/28/11  1610 11/26/11 1359 11/26/11 0742 11/25/11 0747 11/25/11 0638  GLUCAP 112* 95 114* 118* 126*    Recent Results (from the past 240 hour(s))  CULTURE, BLOOD (ROUTINE X 2)     Status: Normal (Preliminary result)   Collection Time   11/23/11  7:00 PM      Component Value Range Status Comment   Specimen Description BLOOD LEFT ARM   Final    Special Requests BOTTLES DRAWN AEROBIC AND ANAEROBIC [facility]   Final    Culture  Setup Time [id number]   Final    Culture     Final    Value:        BLOOD CULTURE RECEIVED NO GROWTH TO DATE CULTURE WILL BE HELD FOR 5 DAYS BEFORE ISSUING A FINAL NEGATIVE REPORT   Report Status PENDING   Incomplete     CULTURE, BLOOD (ROUTINE X 2)     Status: Normal (Preliminary result)   Collection Time   11/23/11  7:05 PM      Component Value Range Status Comment   Specimen Description BLOOD LEFT HAND   Final    Special Requests BOTTLES DRAWN AEROBIC AND ANAEROBIC 6C   Final    Culture  Setup Time [id number]   Final    Culture     Final    Value:        BLOOD CULTURE RECEIVED NO GROWTH TO DATE CULTURE WILL BE HELD FOR 5 DAYS BEFORE ISSUING A FINAL NEGATIVE REPORT   Report Status PENDING   Incomplete   URINE CULTURE     Status: Normal   Collection Time   11/23/11  7:35 PM      Component Value Range Status Comment   Specimen Description URINE, CATHETERIZED   Final    Special Requests NONE   Final    Culture  Setup Time [id number]   Final    Colony Count 15,000 COLONIES/ML   Final    Culture     Final    Value: Multiple bacterial morphotypes present, none predominant. Suggest appropriate recollection if clinically indicated.   Report Status 11/26/2011 FINAL   Final   MRSA PCR SCREENING     Status: Abnormal   Collection Time   11/24/11  6:39 AM      Component Value Range Status Comment   MRSA by PCR POSITIVE (*) NEGATIVE  Final      Studies: Ct Head Wo Contrast  11/23/2011  *RADIOLOGY REPORT*  Clinical Data: Fall.  History of stroke.  CT HEAD WITHOUT CONTRAST  Technique:  Contiguous axial images were obtained from the base of the skull through the vertex without contrast.  Comparison: 08/27/2011  Findings: There is significant central cortical atrophy. Periventricular white matter changes are moderate to severe and appears stable.  Left porencephalic change again identified, associated with prior left frontal craniotomy.  There is no evidence for hemorrhage, mass lesion, or acute infarction.  No evidence for acute calvarial fracture.  Visualized paranasal sinuses and mastoid air cells are clear.  IMPRESSION:  1.  Left frontal craniotomy changes associated porencephaly. 2.  Atrophy and small vessel  disease. 3. No evidence for acute intracranial abnormality.  Original Report Authenticated By: Patterson Hammersmith, M.D.   Dg Pelvis Portable  11/26/2011  *RADIOLOGY REPORT*  Clinical Data: Postoperative for right total hip replacement.  PORTABLE PELVIS  Comparison: 11/23/2011  Findings: A bipolar right hip prosthesis is in place, without fracture or complicating feature observed.  Expected gas noted in the soft  tissues.  IMPRESSION:  1.  Right hip bipolar prosthesis noted, without complicating feature.  Original Report Authenticated By: Dellia Cloud, M.D.   Dg Chest Port 1 View  11/26/2011  *RADIOLOGY REPORT*  Clinical Data: Post Anesthesia breathing problems.  PORTABLE CHEST - 1 VIEW  Comparison: 11/24/2011  Findings: Bilateral lower lobe airspace opacities are present, mildly increased in the left lower lobe but reduced in the right lower lobe.  Underlying interstitial accentuation is present.  Atherosclerotic calcification of the aortic arch is noted.  Mild cardiomegaly is present.  IMPRESSION:  1.  Bibasilar airspace opacities, increased on the left but reduced on the right compared to the exam from 2 days ago.  There is mild underlying interstitial accentuation is well.  Differential diagnostic considerations include edema and pneumonia. 2.  Atherosclerosis. 3.  Mild cardiomegaly.  Original Report Authenticated By: Dellia Cloud, M.D.   Dg Chest Port 1 View  11/24/2011  *RADIOLOGY REPORT*  Clinical Data: Hypoxia  PORTABLE CHEST - 1 VIEW  Comparison: 11/23/2011  Findings: Interval development of extensive bibasilar airspace disease.  This may be due to pneumonia or edema.  Heart size mildly enlarged.  Possible vascular congestion.  No significant effusion.  IMPRESSION: Interval development of diffuse bilateral airspace disease most likely due to pulmonary edema however pneumonia could have this appearance.  Original Report Authenticated By: Camelia Phenes, M.D.    Scheduled Meds:    .  amLODipine  10 mg Oral BID  . aspirin EC  81 mg Oral Daily  . carbamazepine  200 mg Oral BID  . Chlorhexidine Gluconate Cloth  6 each Topical Q0600  . cholecalciferol  4,000 Units Oral Daily  . citalopram  20 mg Oral Daily  . conjugated estrogens  2 g Vaginal Daily  . docusate sodium  100 mg Oral BID  . estradiol  2 g Vaginal 3 times weekly  . ferrous sulfate  325 mg Oral BID WC  . furosemide  20 mg Oral Daily  . lamoTRIgine  25 mg Oral BID  . lisinopril  40 mg Oral Daily  . magic mouthwash  15 mL Oral TID  . metoprolol  100 mg Oral BID  . mupirocin ointment  1 application Nasal BID  . nitrofurantoin (macrocrystal-monohydrate)  100 mg Oral Q12H  . nystatin  1 g Topical TID  . ondansetron  4 mg Oral QHS  . pantoprazole  40 mg Oral Daily  . potassium chloride  20 mEq Oral BID  . saccharomyces boulardii  250 mg Oral BID  . warfarin  3 mg Oral ONCE-1800  . Warfarin - Pharmacist Dosing Inpatient   Does not apply q1800  . DISCONTD: furosemide  40 mg Intravenous BID  . DISCONTD: labetalol  20 mg Intravenous Once   Continuous Infusions:    . sodium chloride 10 mL/hr at 11/28/11 0934     Assessment/Plan: 1. Right Femoral neck fracture: Status post repair. Physical therapy. Skilled nursing facility. Cleared for discharge by orthopedics today. 2. Status post acute respiratory failure: Secondary to acute heart failure as below. 3. Acute/chronic diastolic dose of heart failure: Appears resolved. Recent chest x-ray demonstrated bilateral airspace disease thought to be pulmonary edema. Thought to be secondary to IV fluids and FFP. Continue Lasix for now. Wean oxygen. 4. Postoperative anemia: Stable. 5. Atrial fibrillation: Goal INR 2-2.5. Rate controlled. INR reversed for surgery. Warfarin restarted. INR 1.44 today. Continue metoprolol. Add diltiazem. 6. Hypokalemia: Replete. 7. Seizure disorder: Appears stable. 8. History of CVA:  Chronic residual aphasia. Appears stable. Continue  warfarin. 9. History of C. difficile colitis November 2012:  Discontinue PPI.  Discussed in detail with husband at bedside today. All questions answered to his apparent satisfaction. Anticipate transfer to skilled nursing facility March 9.  Code Status: Full code Family Communication: Alex Mcmanigal husband: home#: [phone] Disposition Plan: Skilled nursing facility   Brendia Sacks, MD  [facility] Pager [phone] 11/29/2011, 7:35 AM    LOS: 6 days

## 2011-11-30 LAB — CULTURE, BLOOD (ROUTINE X 2)
Culture  Setup Time: 201303020151
Culture  Setup Time: 201303020152
Culture: NO GROWTH

## 2011-11-30 LAB — PROTIME-INR
INR: 1.84 — ABNORMAL HIGH (ref 0.00–1.49)
Prothrombin Time: 21.6 seconds — ABNORMAL HIGH (ref 11.6–15.2)

## 2011-11-30 MED ORDER — OXYCODONE-ACETAMINOPHEN 5-325 MG PO TABS: 1.0000 | ORAL_TABLET | ORAL | Status: AC | PRN

## 2011-11-30 MED ORDER — DILTIAZEM HCL ER COATED BEADS 120 MG PO CP24: 120.0000 mg | ORAL_CAPSULE | Freq: Every day | ORAL | Status: DC

## 2011-11-30 MED ORDER — FUROSEMIDE 20 MG PO TABS: 20.0000 mg | ORAL_TABLET | Freq: Every day | ORAL | Status: DC

## 2011-11-30 MED ORDER — WARFARIN SODIUM 2 MG PO TABS
2.0000 mg | ORAL_TABLET | Freq: Once | ORAL | Status: DC
  Filled 2011-11-30: qty 1

## 2011-11-30 NOTE — Progress Notes (Signed)
PROGRESS NOTE  [name] UJW:[id number] DOB: 1935/12/30 DOA: 11/23/2011 PCP: [name] Dimitri, MD, MD  Brief narrative: 76 year old woman who sustained a hip fracture after a mechanical fall.  [name] was admitted to the medical floor and surgery was delayed because of her elevated INR. Prior to surgery she was found to be hypoxic but asymptomatic. Chest x-ray revealed bilateral airspace disease consistent with pulmonary edema. She was treated for acute/chronic diastolic congestive heart failure. She certainly improved and underwent successful right hemiarthroplasty of the hip. Postoperatively she was quite hypoxic and therefore transferred to the ICU.   November 2012: Hospitalization: Pneumonia, C. difficile colitis.  Past medical history: Stroke with residual aphasia, atrial fibrillation on warfarin, depression, seizure disorder  Consultants:  Orthopedics  Physical medicine and rehabilitation: Skilled nursing facility.  Physical therapy: Skilled nursing facility.  Procedures:  March 2: 2-D echocardiogram: Left ventricular ejection fraction 60-65%. Normal wall motion.  March 5: Right hemiarthroplasty  Antibiotics:  None  Interim History: Chart reviewed in detail. Interval documentation reviewed.  Subjective: No complaints. No dizziness.  Objective: Filed Vitals:   11/29/11 1410 11/29/11 1419 11/29/11 2020 11/30/11 0511  BP: 155/84  137/76 131/74  Pulse: 85  89 66  Temp: 97.8 F (36.6 C)  98.2 F (36.8 C) 97.3 F (36.3 C)  TempSrc: Oral  Oral Axillary  Resp: 18  20 16   Height:      Weight:    69.9 kg (154 lb 1.6 oz)  SpO2: 94% 88% 94% 93%    Intake/Output Summary (Last 24 hours) at 11/30/11 0903 Last data filed at 11/29/11 1422  Gross per 24 hour  Intake    120 ml  Output      0 ml  Net    120 ml    Exam:   General:  Appears calm and comfortable.  Cardiovascular: Irregular rhythm. Normal rate. No murmur rub or gallop. No lower extremity edema of  note.  Respiratory: Clear to auscultation bilaterally. No wheezes, rales, rhonchi. Normal respiratory effort.  Psychiatric: Grossly normal mood and affect.   Data Reviewed: Basic Metabolic Panel:  Lab 11/28/11 5621 11/27/11 3086 11/26/11 0458 11/25/11 0610 11/24/11 0421 11/23/11 2024  NA 133* 136 137 141 137 --  K 3.2* 3.4* -- -- -- --  CL 93* 96 95* 102 102 --  CO2 30 32 34* 31 28 --  GLUCOSE 107* 124* 113* 123* 129* --  BUN 27* 28* 23 19 14  --  CREATININE 0.90 1.04 0.99 1.09 0.76 --  CALCIUM 9.1 8.9 9.0 9.1 8.9 --  MG -- 2.0 -- 1.5 -- 1.4*  PHOS -- -- -- -- -- 2.6   Liver Function Tests:  Lab 11/24/11 0421 11/23/11 2024 11/23/11 1600  AST 14 16 17   ALT 11 13 14   ALKPHOS 63 71 79  BILITOT 0.3 0.3 0.3  PROT 6.0 6.8 7.4  ALBUMIN 3.1* 3.5 4.0   CBC:  Lab 11/28/11 0500 11/27/11 0430 11/25/11 0610 11/24/11 0421 11/23/11 2024 11/23/11 1600  WBC 8.7 8.7 11.0* 10.9* 14.7* --  NEUTROABS -- -- -- -- 12.7* 11.4*  HGB 10.6* 10.5* 11.9* 12.1 13.1 --  HCT 30.9* 30.6* 34.9* 34.2* 37.4 --  MCV 95.1 95.0 94.8 94.2 93.7 --  PLT 209 190 185 217 268 --   Cardiac Enzymes:  Lab 11/24/11 0421 11/23/11 2020  CKTOTAL 183* 80  CKMB 2.4 2.6  CKMBINDEX -- --  TROPONINI <0.30 <0.30   CBG:  Lab 11/30/11 0833 11/29/11 0803 11/28/11 0739 11/26/11 1359  11/26/11 0742  GLUCAP 107* 113* 112* 95 114*    Recent Results (from the past 240 hour(s))  CULTURE, BLOOD (ROUTINE X 2)     Status: Normal (Preliminary result)   Collection Time   11/23/11  7:00 PM      Component Value Range Status Comment   Specimen Description BLOOD LEFT ARM   Final    Special Requests BOTTLES DRAWN AEROBIC AND ANAEROBIC [facility]   Final    Culture  Setup Time [id number]   Final    Culture     Final    Value:        BLOOD CULTURE RECEIVED NO GROWTH TO DATE CULTURE WILL BE HELD FOR 5 DAYS BEFORE ISSUING A FINAL NEGATIVE REPORT   Report Status PENDING   Incomplete   CULTURE, BLOOD (ROUTINE X 2)     Status: Normal  (Preliminary result)   Collection Time   11/23/11  7:05 PM      Component Value Range Status Comment   Specimen Description BLOOD LEFT HAND   Final    Special Requests BOTTLES DRAWN AEROBIC AND ANAEROBIC 6C   Final    Culture  Setup Time [id number]   Final    Culture     Final    Value:        BLOOD CULTURE RECEIVED NO GROWTH TO DATE CULTURE WILL BE HELD FOR 5 DAYS BEFORE ISSUING A FINAL NEGATIVE REPORT   Report Status PENDING   Incomplete   URINE CULTURE     Status: Normal   Collection Time   11/23/11  7:35 PM      Component Value Range Status Comment   Specimen Description URINE, CATHETERIZED   Final    Special Requests NONE   Final    Culture  Setup Time [id number]   Final    Colony Count 15,000 COLONIES/ML   Final    Culture     Final    Value: Multiple bacterial morphotypes present, none predominant. Suggest appropriate recollection if clinically indicated.   Report Status 11/26/2011 FINAL   Final   MRSA PCR SCREENING     Status: Abnormal   Collection Time   11/24/11  6:39 AM      Component Value Range Status Comment   MRSA by PCR POSITIVE (*) NEGATIVE  Final      Studies: Ct Head Wo Contrast  11/23/2011  *RADIOLOGY REPORT*  Clinical Data: Fall.  History of stroke.  CT HEAD WITHOUT CONTRAST  Technique:  Contiguous axial images were obtained from the base of the skull through the vertex without contrast.  Comparison: 08/27/2011  Findings: There is significant central cortical atrophy. Periventricular white matter changes are moderate to severe and appears stable.  Left porencephalic change again identified, associated with prior left frontal craniotomy.  There is no evidence for hemorrhage, mass lesion, or acute infarction.  No evidence for acute calvarial fracture.  Visualized paranasal sinuses and mastoid air cells are clear.  IMPRESSION:  1.  Left frontal craniotomy changes associated porencephaly. 2.  Atrophy and small vessel disease. 3. No evidence for acute intracranial  abnormality.  Original Report Authenticated By: Patterson Hammersmith, M.D.   Dg Pelvis Portable  11/26/2011  *RADIOLOGY REPORT*  Clinical Data: Postoperative for right total hip replacement.  PORTABLE PELVIS  Comparison: 11/23/2011  Findings: A bipolar right hip prosthesis is in place, without fracture or complicating feature observed.  Expected gas noted in the soft tissues.  IMPRESSION:  1.  Right hip  bipolar prosthesis noted, without complicating feature.  Original Report Authenticated By: Dellia Cloud, M.D.   Dg Chest Port 1 View  11/26/2011  *RADIOLOGY REPORT*  Clinical Data: Post Anesthesia breathing problems.  PORTABLE CHEST - 1 VIEW  Comparison: 11/24/2011  Findings: Bilateral lower lobe airspace opacities are present, mildly increased in the left lower lobe but reduced in the right lower lobe.  Underlying interstitial accentuation is present.  Atherosclerotic calcification of the aortic arch is noted.  Mild cardiomegaly is present.  IMPRESSION:  1.  Bibasilar airspace opacities, increased on the left but reduced on the right compared to the exam from 2 days ago.  There is mild underlying interstitial accentuation is well.  Differential diagnostic considerations include edema and pneumonia. 2.  Atherosclerosis. 3.  Mild cardiomegaly.  Original Report Authenticated By: Dellia Cloud, M.D.   Dg Chest Port 1 View  11/24/2011  *RADIOLOGY REPORT*  Clinical Data: Hypoxia  PORTABLE CHEST - 1 VIEW  Comparison: 11/23/2011  Findings: Interval development of extensive bibasilar airspace disease.  This may be due to pneumonia or edema.  Heart size mildly enlarged.  Possible vascular congestion.  No significant effusion.  IMPRESSION: Interval development of diffuse bilateral airspace disease most likely due to pulmonary edema however pneumonia could have this appearance.  Original Report Authenticated By: Camelia Phenes, M.D.    Scheduled Meds:    . aspirin EC  81 mg Oral Daily  . carbamazepine   200 mg Oral BID  . Chlorhexidine Gluconate Cloth  6 each Topical Q0600  . cholecalciferol  4,000 Units Oral Daily  . citalopram  20 mg Oral Daily  . conjugated estrogens  2 g Vaginal Daily  . diltiazem  120 mg Oral Daily  . docusate sodium  100 mg Oral BID  . estradiol  2 g Vaginal 3 times weekly  . ferrous sulfate  325 mg Oral BID WC  . lamoTRIgine  25 mg Oral BID  . lisinopril  40 mg Oral Daily  . magic mouthwash  15 mL Oral TID  . metoprolol  100 mg Oral BID  . mupirocin ointment  1 application Nasal BID  . nitrofurantoin (macrocrystal-monohydrate)  100 mg Oral Q12H  . nystatin  1 g Topical TID  . ondansetron  4 mg Oral QHS  . pantoprazole  40 mg Oral Daily  . potassium chloride  20 mEq Oral BID  . saccharomyces boulardii  250 mg Oral BID  . warfarin  3 mg Oral ONCE-1800  . Warfarin - Pharmacist Dosing Inpatient   Does not apply q1800  . DISCONTD: amLODipine  10 mg Oral BID  . DISCONTD: furosemide  20 mg Oral Daily   Continuous Infusions:    . sodium chloride 10 mL/hr at 11/28/11 0934     Assessment/Plan: 1. Right Femoral neck fracture: Status post repair. Physical therapy. Skilled nursing facility. Cleared for discharge by orthopedics today. 2. Status post acute respiratory failure: Resolved. Secondary to acute heart failure as below. 3. Acute/chronic diastolic dose of heart failure: Resolved. Recent chest x-ray demonstrated bilateral airspace disease thought to be pulmonary edema. Thought to be secondary to IV fluids and FFP. Continue Lasix for now. Wean oxygen. 4. Postoperative anemia: Stable. 5. Atrial fibrillation: Goal INR 2-2.5. Rate controlled. INR reversed for surgery. Warfarin restarted. INR 1.44 today. Continue metoprolol and diltiazem. 6. Seizure disorder: Appears stable. 7. History of CVA: Chronic residual aphasia. Appears stable. Continue warfarin. 8. History of C. difficile colitis November 2012:  Discontinue PPI?  F/U Dr Luiz Blare 3 weeks.  Staples out in 2  weeks.  WBAT on right. Needs daily PT.  Code Status: Full code Family Communication: Elizabethanne Lusher husband: home#: [phone] Disposition Plan: Skilled nursing facility today.   Brendia Sacks, MD  [facility] Pager [phone] 11/30/2011, 9:03 AM    LOS: 7 days

## 2011-11-30 NOTE — Progress Notes (Signed)
Subjective: 4 Days Post-Op Procedure(s) (LRB): ARTHROPLASTY BIPOLAR HIP (Right) Patient reports pain as minimal.   Objective: Vital signs in last 24 hours: Temp:  [97.3 F (36.3 C)-98.2 F (36.8 C)] 97.3 F (36.3 C) (03/08 0511) Pulse Rate:  [66-154] 66  (03/08 0511) Resp:  [16-24] 16  (03/08 0511) BP: (131-163)/(74-112) 131/74 mmHg (03/08 0511) SpO2:  [88 %-94 %] 93 % (03/08 0511) Weight:  [69.9 kg (154 lb 1.6 oz)] 69.9 kg (154 lb 1.6 oz) (03/08 0511)  Intake/Output from previous day: 03/07 0701 - 03/08 0700 In: 240 [P.O.:240] Out: -  Intake/Output this shift:     Basename 11/28/11 0500  HGB 10.6*    Basename 11/28/11 0500  WBC 8.7  RBC 3.25*  HCT 30.9*  PLT 209    Basename 11/28/11 0448  NA 133*  K 3.2*  CL 93*  CO2 30  BUN 27*  CREATININE 0.90  GLUCOSE 107*  CALCIUM 9.1    Basename 11/30/11 0428 11/29/11 0500  LABPT -- --  INR 1.84* 1.74*  Right hip dressing benign.  Neurovascular intact Sensation intact distally Intact pulses distally Dorsiflexion/Plantar flexion intact  Assessment/Plan: 4 Days Post-Op Procedure(s) (LRB): ARTHROPLASTY BIPOLAR HIP (Right) PLAN; Discharge to SNF hopefully today. F/U Dr Luiz Blare 3 weeks. Staples out in 2 weeks. WBAT on right.Needs daily PT.  Osama Coleson G 11/30/2011, 7:55 AM

## 2011-11-30 NOTE — Discharge Summary (Signed)
Physician Discharge Summary  [name] ZOX:[id number] DOB: 1936-01-22 DOA: 11/23/2011  PCP: Gweneth Dimitri, MD, MD Orthopedic surgeon: Jodi Geralds, M.D.  Admit date: 11/23/2011 Discharge date: 11/30/2011  Discharge Diagnoses:  1. Right femoral neck fracture, status post repair 2. Acute/chronic diastolic congestive heart failure, now compensated 3. Acute respiratory failure, resolved 4. Postoperative anemia, stable 5. Atrial fibrillation, stable  Discharge Condition: Improved  Disposition: Skilled nursing facility for short-term rehabilitation.  History of present illness:  76 year old woman who sustained a hip fracture after a mechanical fall.     November 2012: Hospitalization: Pneumonia, C. difficile colitis.  Hospital Course:  [name] was admitted to the medical floor and surgery was delayed because of her elevated INR. Prior to surgery she was found to be hypoxic but asymptomatic. Chest x-ray revealed bilateral airspace disease consistent with pulmonary edema. She was treated for acute/chronic diastolic congestive heart failure. She subsequently improved and underwent successful right hemiarthroplasty of the hip. Postoperatively she was quite hypoxic and therefore transferred to the ICU. She quickly improved with diuresis. Her diastolic heart failure has been stable and she continues on low-dose Lasix. Atrial fibrillation has been well controlled.  Her husband has a history of dizziness for some time as an outpatient which is actually lightheadedness and is associated with positional changes. I discussed orthostatic hypotension with him and slow positional changes. He may be challenging to treat her hypertension and reduce her secondary risk for stroke if lightheadedness recurs. It is important to follow her blood pressure as well as her volume status, hydration status to assess her need for long-term diuretics and to attempt to prevent orthostasis. 1. Right Femoral neck fracture:  Status post repair. Physical therapy. Skilled nursing facility. Cleared for discharge by orthopedics today. WBAT on right. Needs daily PT. Staples out in 2 weeks.   2. Status post acute respiratory failure: Resolved. Secondary to acute heart failure as below.  3. Acute/chronic diastolic dose of heart failure: Resolved. Recent chest x-ray demonstrated bilateral airspace disease thought to be pulmonary edema. Thought to be secondary to IV fluids and FFP. Continue Lasix for now. Wean oxygen as tolerated.  4. Postoperative anemia: Stable.  5. Atrial fibrillation: Goal INR 2-2.5. Rate controlled. INR reversed for surgery. Warfarin restarted. Continue metoprolol and diltiazem.  6. Seizure disorder: Appears stable.  7. History of CVA: Chronic residual aphasia. Appears stable. Continue warfarin.  8. History of C. difficile colitis November 2012:  PPI has been discontinued although GERD symptoms recur consideration in the given to restart this medication. I do not see any history of peptic ulcer disease.  Consultants:  Orthopedics   Physical medicine and rehabilitation: Skilled nursing facility.   Physical therapy: Skilled nursing facility.  Procedures:  March 2: 2-D echocardiogram: Left ventricular ejection fraction 60-65%. Normal wall motion.   March 5: Right hemiarthroplasty  Discharge Instructions  Discharge Orders    Future Orders Please Complete By Expires   Diet - low sodium heart healthy      Increase activity slowly      Discharge instructions      Comments:   F/U Dr Luiz Blare 3 weeks.   Staples out in 2 weeks.   WBAT on right. Needs daily PT.      Medication List  As of 11/30/2011  1:08 PM   STOP taking these medications         amLODipine 10 MG tablet      lamoTRIgine 25 MG tablet      nitrofurantoin (macrocrystal-monohydrate) 100 MG  capsule      pantoprazole 40 MG tablet         TAKE these medications         aspirin EC 81 MG EC tablet   Generic drug: aspirin    Take 81 mg by mouth daily. Swallow whole.      barrier cream Crea   Commonly known as: non-specified   Apply 1 application topically 3 (three) times daily as needed.      carbamazepine 200 MG tablet   Commonly known as: TEGRETOL   Take 200 mg by mouth 2 (two) times daily.      cholecalciferol 1000 UNITS tablet   Commonly known as: VITAMIN D   Take 4,000 Units by mouth daily.      citalopram 10 MG tablet   Commonly known as: CELEXA   Take 20 mg by mouth daily.      diltiazem 120 MG 24 hr capsule   Commonly known as: CARDIZEM CD   Take 1 capsule (120 mg total) by mouth daily.      estradiol 0.1 MG/GM vaginal cream   Commonly known as: ESTRACE   Place 2 g vaginally 3 (three) times a week.      furosemide 20 MG tablet   Commonly known as: LASIX   Take 1 tablet (20 mg total) by mouth daily.      hyoscyamine 0.125 MG SL tablet   Commonly known as: LEVSIN SL   Place 0.125 mg under the tongue every 4 (four) hours as needed. For stomach cramps      lisinopril 40 MG tablet   Commonly known as: PRINIVIL,ZESTRIL   Take 40 mg by mouth daily.      LORazepam 1 MG tablet   Commonly known as: ATIVAN   Take 0.5 mg by mouth at bedtime as needed.      magic mouthwash Soln   Take 15 mLs by mouth 3 (three) times daily.      metoprolol 100 MG tablet   Commonly known as: LOPRESSOR   Take 1 tablet (100 mg total) by mouth 2 (two) times daily.      nystatin 100000 UNIT/GM Powd   Apply 1 g (100,000 Units total) topically 3 (three) times daily. Apply to affected back,thigh-peri rectal skin      ondansetron 4 MG tablet   Commonly known as: ZOFRAN   Take 4 mg by mouth at bedtime.      oxyCODONE-acetaminophen 5-325 MG per tablet   Commonly known as: PERCOCET   Take 1 tablet by mouth every 4 (four) hours as needed.      PREMARIN vaginal cream   Generic drug: conjugated estrogens   Place 30 g vaginally daily.      saccharomyces boulardii 250 MG capsule   Commonly known as: FLORASTOR    Take 250 mg by mouth 2 (two) times daily.      warfarin 4 MG tablet   Commonly known as: COUMADIN   Take 2-4 mg by mouth daily. Take 2 mg (1/2 tab) on Mon and Fri, and 4 mg on Sun, Tues, Wed, Thur, and Sat           Follow-up Information    Follow up with GRAVES,JOHN L, MD. Schedule an appointment as soon as possible for a visit in 3 weeks.   Contact information:   [address] [place] [address] [phone]       Follow up with [facility], MD. Schedule an appointment as soon as  possible for a visit in 3 weeks.   Contact information:   [address] [place] [address] [phone]           The results of significant diagnostics from this hospitalization (including imaging, microbiology, ancillary and laboratory) are listed below for reference.    Significant Diagnostic Studies: Dg Chest 1 View  11/23/2011  *RADIOLOGY REPORT*  Clinical Data: Larey Seat.  Right hip pain, deformity.  Syncope.  CHEST - 1 VIEW  Comparison: 08/29/2011  Findings: The heart is enlarged. No pulmonary edema.  No focal consolidations or pleural effusions.  No pneumothorax or evidence for acute fracture.  IMPRESSION:  1.  Cardiomegaly. 2. No evidence for acute cardiopulmonary abnormality.  Original Report Authenticated By: Patterson Hammersmith, M.D.   Dg Hip Bilateral Vito Berger  11/23/2011  *RADIOLOGY REPORT*  Clinical Data: Larey Seat.  Right hip pain and deformity.  BILATERAL HIP WITH PELVIS - 4+ VIEW  Comparison: CT 08/21/2011  Findings: There is a comminuted fracture of the right femoral neck, associated with varus angulation and impaction.  The femoral head is located within the acetabulum.  Views of the left proximal femur show no evidence for fracture, dislocation.  Regional bowel gas pattern is nonobstructive. There are mild degenerative changes in the lower lumbar spine.  IMPRESSION: Comminuted fracture of the right femoral neck, associated with varus angulation and  impaction.  Original Report Authenticated By: Patterson Hammersmith, M.D.   Dg Knee 1-2 Views Right  11/23/2011  *RADIOLOGY REPORT*  Clinical Data: Larey Seat.  Right hip pain and deformity.  RIGHT KNEE - 1-2 VIEW  Comparison:  None  Findings:  Bones appear radiolucent.  There is no evidence for acute fracture or dislocation.  Mild degenerate changes identified in the medial and patellofemoral compartments.  No joint effusion. Note is made of popliteal artery calcification.  IMPRESSION:  No evidence for acute . abnormality. .  Original Report Authenticated By: Patterson Hammersmith, M.D.   Ct Head Wo Contrast  11/23/2011  *RADIOLOGY REPORT*  Clinical Data: Fall.  History of stroke.  CT HEAD WITHOUT CONTRAST  Technique:  Contiguous axial images were obtained from the base of the skull through the vertex without contrast.  Comparison: 08/27/2011  Findings: There is significant central cortical atrophy. Periventricular white matter changes are moderate to severe and appears stable.  Left porencephalic change again identified, associated with prior left frontal craniotomy.  There is no evidence for hemorrhage, mass lesion, or acute infarction.  No evidence for acute calvarial fracture.  Visualized paranasal sinuses and mastoid air cells are clear.  IMPRESSION:  1.  Left frontal craniotomy changes associated porencephaly. 2.  Atrophy and small vessel disease. 3. No evidence for acute intracranial abnormality.  Original Report Authenticated By: Patterson Hammersmith, M.D.   Dg Pelvis Portable  11/26/2011  *RADIOLOGY REPORT*  Clinical Data: Postoperative for right total hip replacement.  PORTABLE PELVIS  Comparison: 11/23/2011  Findings: A bipolar right hip prosthesis is in place, without fracture or complicating feature observed.  Expected gas noted in the soft tissues.  IMPRESSION:  1.  Right hip bipolar prosthesis noted, without complicating feature.  Original Report Authenticated By: Dellia Cloud, M.D.   Dg Chest Port  1 View  11/26/2011  *RADIOLOGY REPORT*  Clinical Data: Post Anesthesia breathing problems.  PORTABLE CHEST - 1 VIEW  Comparison: 11/24/2011  Findings: Bilateral lower lobe airspace opacities are present, mildly increased in the left lower lobe but reduced in the right lower lobe.  Underlying interstitial accentuation is present.  Atherosclerotic calcification of the aortic arch is noted.  Mild cardiomegaly is present.  IMPRESSION:  1.  Bibasilar airspace opacities, increased on the left but reduced on the right compared to the exam from 2 days ago.  There is mild underlying interstitial accentuation is well.  Differential diagnostic considerations include edema and pneumonia. 2.  Atherosclerosis. 3.  Mild cardiomegaly.  Original Report Authenticated By: Dellia Cloud, M.D.    Microbiology: Recent Results (from the past 240 hour(s))  CULTURE, BLOOD (ROUTINE X 2)     Status: Normal   Collection Time   11/23/11  7:00 PM      Component Value Range Status Comment   Specimen Description BLOOD LEFT ARM   Final    Special Requests BOTTLES DRAWN AEROBIC AND ANAEROBIC [facility]   Final    Culture  Setup Time [id number]   Final    Culture NO GROWTH 5 DAYS   Final    Report Status 11/30/2011 FINAL   Final   CULTURE, BLOOD (ROUTINE X 2)     Status: Normal   Collection Time   11/23/11  7:05 PM      Component Value Range Status Comment   Specimen Description BLOOD LEFT HAND   Final    Special Requests BOTTLES DRAWN AEROBIC AND ANAEROBIC 6C   Final    Culture  Setup Time [id number]   Final    Culture NO GROWTH 5 DAYS   Final    Report Status 11/30/2011 FINAL   Final   URINE CULTURE     Status: Normal   Collection Time   11/23/11  7:35 PM      Component Value Range Status Comment   Specimen Description URINE, CATHETERIZED   Final    Special Requests NONE   Final    Culture  Setup Time [id number]   Final    Colony Count 15,000 COLONIES/ML   Final    Culture     Final    Value: Multiple bacterial  morphotypes present, none predominant. Suggest appropriate recollection if clinically indicated.   Report Status 11/26/2011 FINAL   Final   MRSA PCR SCREENING     Status: Abnormal   Collection Time   11/24/11  6:39 AM      Component Value Range Status Comment   MRSA by PCR POSITIVE (*) NEGATIVE  Final      Labs: Basic Metabolic Panel:  Lab 11/28/11 2841 11/27/11 0443 11/26/11 0458 11/25/11 0610 11/24/11 0421 11/23/11 2024  NA 133* 136 137 141 137 --  K 3.2* 3.4* -- -- -- --  CL 93* 96 95* 102 102 --  CO2 30 32 34* 31 28 --  GLUCOSE 107* 124* 113* 123* 129* --  BUN 27* 28* 23 19 14  --  CREATININE 0.90 1.04 0.99 1.09 0.76 --  CALCIUM 9.1 8.9 9.0 9.1 8.9 --  MG -- 2.0 -- 1.5 -- 1.4*  PHOS -- -- -- -- -- 2.6   Liver Function Tests:  Lab 11/24/11 0421 11/23/11 2024 11/23/11 1600  AST 14 16 17   ALT 11 13 14   ALKPHOS 63 71 79  BILITOT 0.3 0.3 0.3  PROT 6.0 6.8 7.4  ALBUMIN 3.1* 3.5 4.0   CBC:  Lab 11/28/11 0500 11/27/11 0430 11/25/11 0610 11/24/11 0421 11/23/11 2024 11/23/11 1600  WBC 8.7 8.7 11.0* 10.9* 14.7* --  NEUTROABS -- -- -- -- 12.7* 11.4*  HGB 10.6* 10.5* 11.9* 12.1 13.1 --  HCT 30.9*  30.6* 34.9* 34.2* 37.4 --  MCV 95.1 95.0 94.8 94.2 93.7 --  PLT 209 190 185 217 268 --   Cardiac Enzymes:  Lab 11/24/11 0421 11/23/11 2020  CKTOTAL 183* 80  CKMB 2.4 2.6  CKMBINDEX -- --  TROPONINI <0.30 <0.30   CBG:  Lab 11/30/11 0833 11/29/11 0803 11/28/11 0739 11/26/11 1359 11/26/11 0742  GLUCAP 107* 113* 112* 95 114*    Time coordinating discharge: 35 minutes.  Signed:  Brendia Sacks, MD  [facility] 11/30/2011, 1:05 PM

## 2011-11-30 NOTE — Progress Notes (Signed)
ANTICOAGULATION CONSULT NOTE - Follow Up Consult  Pharmacy Consult for Warfarin Indication: atrial fibrillation, s/p THR  Labs:  Basename 11/30/11 0428 11/29/11 0500 11/28/11 0500 11/28/11 0448  HGB -- -- 10.6* --  HCT -- -- 30.9* --  PLT -- -- 209 --  APTT -- -- -- --  LABPROT 21.6* 20.7* -- 17.8*  INR 1.84* 1.74* -- 1.44  HEPARINUNFRC -- -- -- --  CREATININE -- -- -- 0.90  CKTOTAL -- -- -- --  CKMB -- -- -- --  TROPONINI -- -- -- --   Medications:  Scheduled:     . aspirin EC  81 mg Oral Daily  . carbamazepine  200 mg Oral BID  . Chlorhexidine Gluconate Cloth  6 each Topical Q0600  . cholecalciferol  4,000 Units Oral Daily  . citalopram  20 mg Oral Daily  . conjugated estrogens  2 g Vaginal Daily  . diltiazem  120 mg Oral Daily  . docusate sodium  100 mg Oral BID  . estradiol  2 g Vaginal 3 times weekly  . ferrous sulfate  325 mg Oral BID WC  . lisinopril  40 mg Oral Daily  . magic mouthwash  15 mL Oral TID  . metoprolol  100 mg Oral BID  . mupirocin ointment  1 application Nasal BID  . nitrofurantoin (macrocrystal-monohydrate)  100 mg Oral Q12H  . nystatin  1 g Topical TID  . ondansetron  4 mg Oral QHS  . saccharomyces boulardii  250 mg Oral BID  . warfarin  3 mg Oral ONCE-1800  . Warfarin - Pharmacist Dosing Inpatient   Does not apply q1800  . DISCONTD: amLODipine  10 mg Oral BID  . DISCONTD: furosemide  20 mg Oral Daily  . DISCONTD: lamoTRIgine  25 mg Oral BID  . DISCONTD: pantoprazole  40 mg Oral Daily   Warfarin PTA dose 2mg  on M,F; 4mg  other days  Assessment:  76 yo F s/p R THR.  Chronic Coumadin for Afib, INR reversed with FFP, VitK 5mg  IV for surgery  Ortho note states aim for INR 2, will suggest 2-2.5; low end of therapeutic range as pt on chronic Warfarin for Afib  Home dose = 4 mg daily except 2 mg on Mon/Fri  INR continues to progress towards goal 2-3, no bleeding events reported in chart.  Goal of Therapy:  (INR 2-3 for Afib), goal post-op  aiming for 2-2.5   Plan:   Coumadin 2 mg po x 1 tonight.    Pharmacy will f/u daily   Darrol Angel, PharmD Pager: [phone] 11/30/2011 10:47 AM

## 2011-11-30 NOTE — Progress Notes (Signed)
Patient set to discharge to [facility] today. Daughter, Selena Batten & husband, Sharma Covert made aware. [facility] called for transport.   Unice Bailey, LCSWA [phone]

## 2011-12-19 ENCOUNTER — Encounter (HOSPITAL_COMMUNITY): Payer: Self-pay | Admitting: Orthopedic Surgery

## 2012-02-05 ENCOUNTER — Other Ambulatory Visit: Payer: Self-pay | Admitting: Gastroenterology

## 2012-02-05 MED ORDER — LORAZEPAM 0.5 MG PO TABS: 0.5000 mg | ORAL_TABLET | Freq: Three times a day (TID) | ORAL | Status: AC

## 2012-02-05 NOTE — Telephone Encounter (Signed)
Dr Arlyce Dice, This patient wants a script of Lorazepam faxed to her pharmacy, Do you want to prescribe for this patient. She had a flex with Dr Rhea Belton

## 2012-02-05 NOTE — Telephone Encounter (Signed)
ok 

## 2012-02-05 NOTE — Telephone Encounter (Signed)
Med script faxed today to the number provided

## 2012-02-11 IMAGING — CT CT ABD-PELV W/ CM
2 of 5 series · 17 of 46 positions shown, 19 images · IV contrast (APPLIED)
Comparison: None.

CLINICAL DATA: Fever.  Diarrhea.  Abdominal pain.

CT ABDOMEN AND PELVIS WITH CONTRAST
TECHNIQUE: Multidetector CT imaging of the abdomen and pelvis was
performed following the standard protocol during bolus
administration of intravenous contrast.
Contrast: 100mL OMNIPAQUE IOHEXOL 300 MG/ML IV SOLN

[Series 2: abd/pelv with 5.0 b31f st · axial · 0.72mm/px · z∈[-385,-5]mm · 14 of 86 slices shown, 16 images]
[im 5/86  soft-tissue]
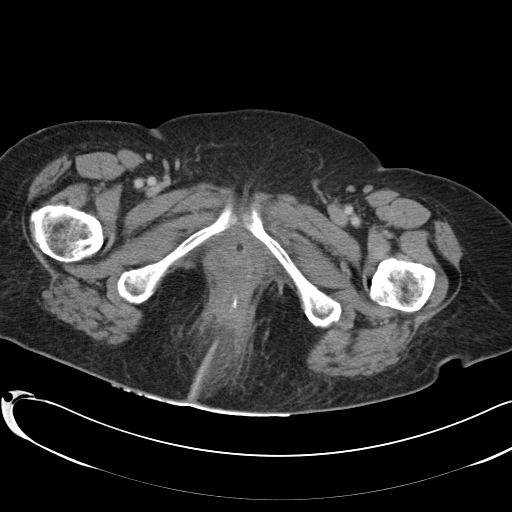
[im 5/86  bone]
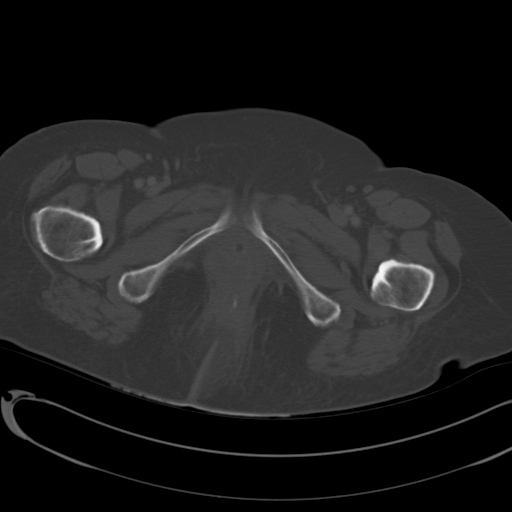
[im 13/86  soft-tissue]
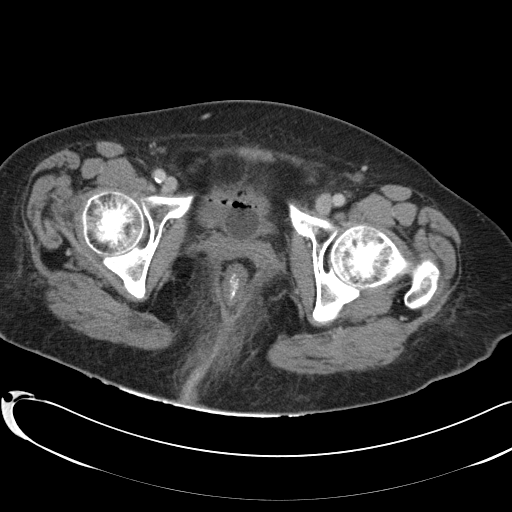
[im 18/86  soft-tissue]
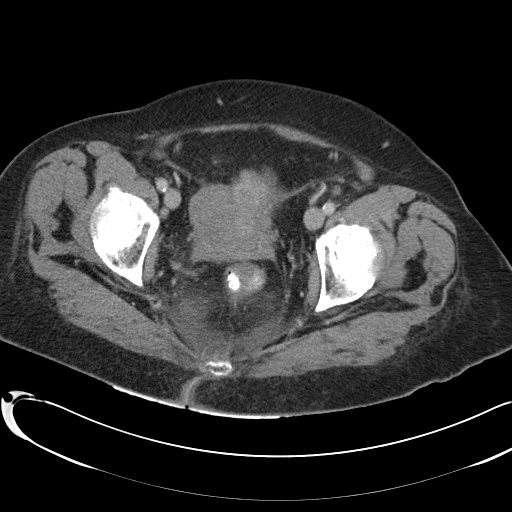
[im 22/86  soft-tissue]
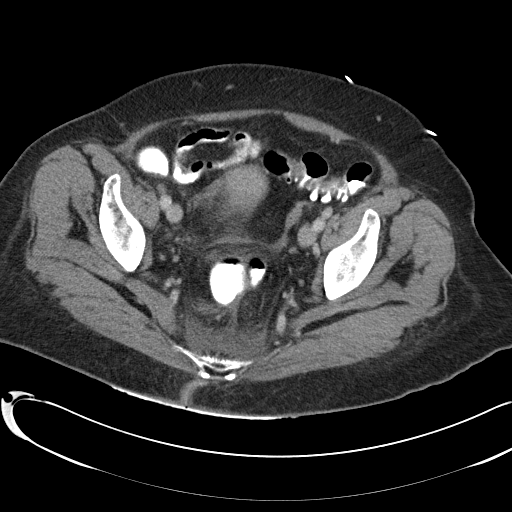
[im 30/86  soft-tissue]
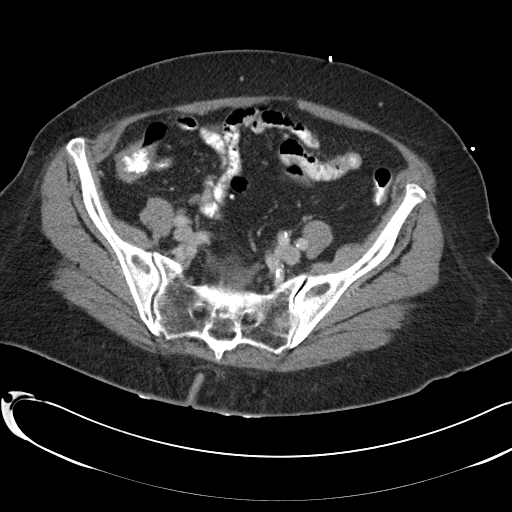
[im 35/86  soft-tissue]
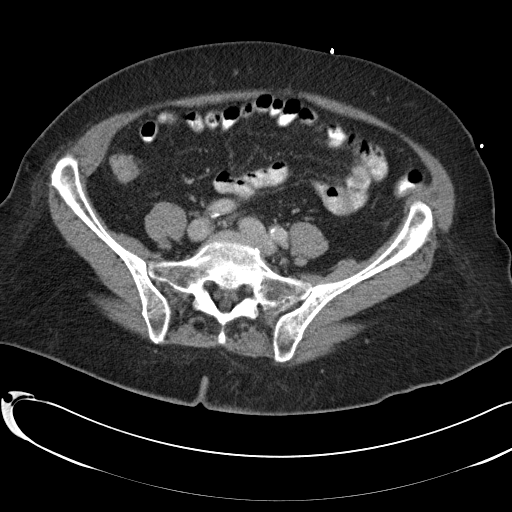
[im 39/86  soft-tissue]
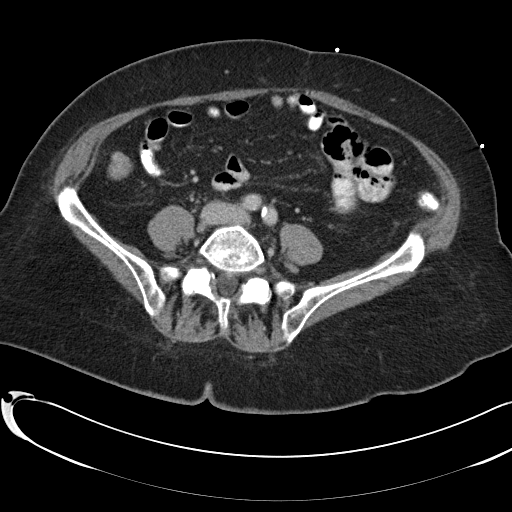
[im 47/86  soft-tissue]
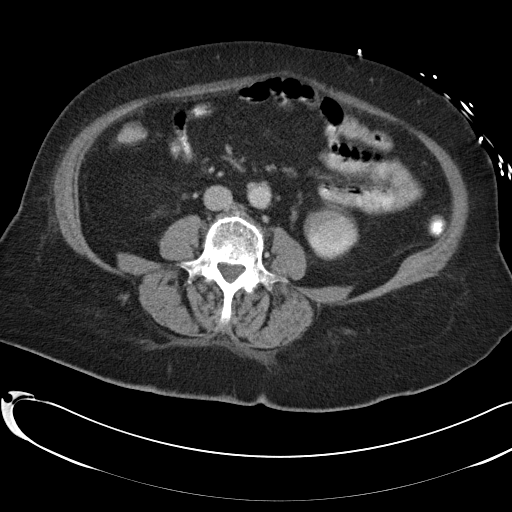
[im 52/86  soft-tissue]
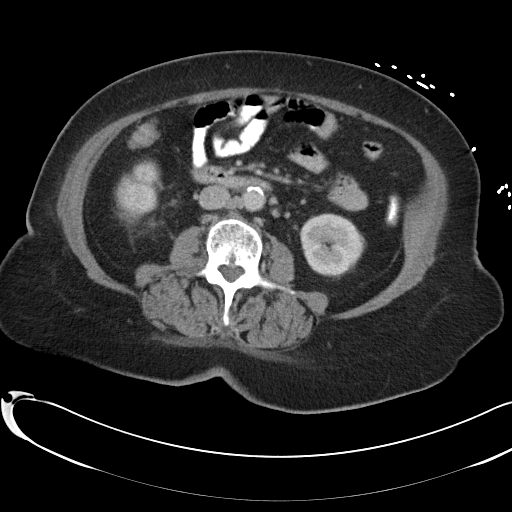
[im 52/86  bone]
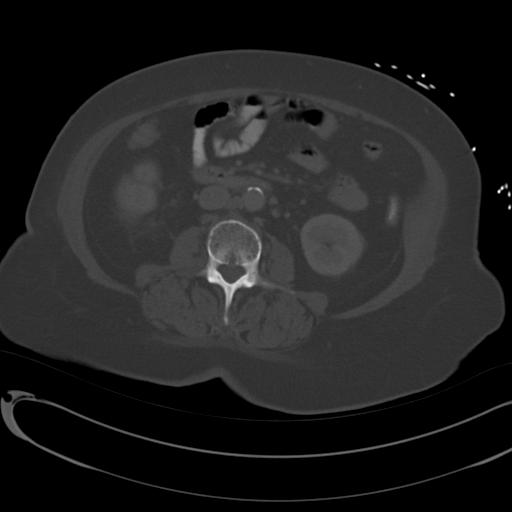
[im 56/86  soft-tissue]
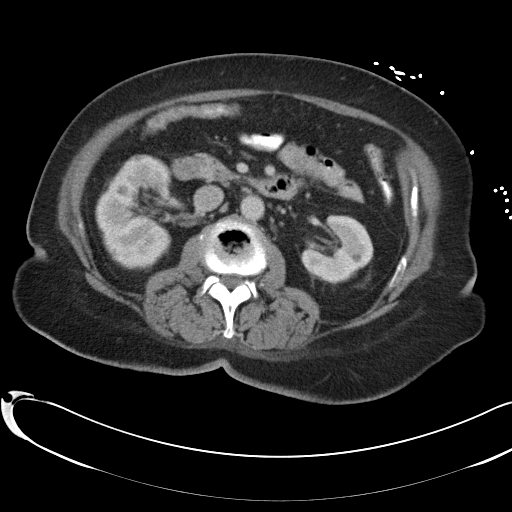
[im 64/86  soft-tissue]
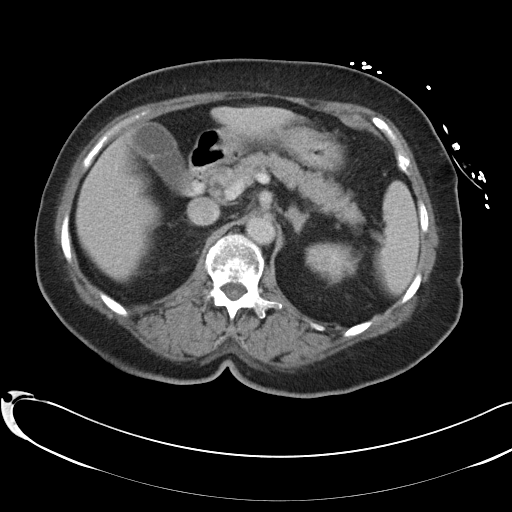
[im 69/86  soft-tissue]
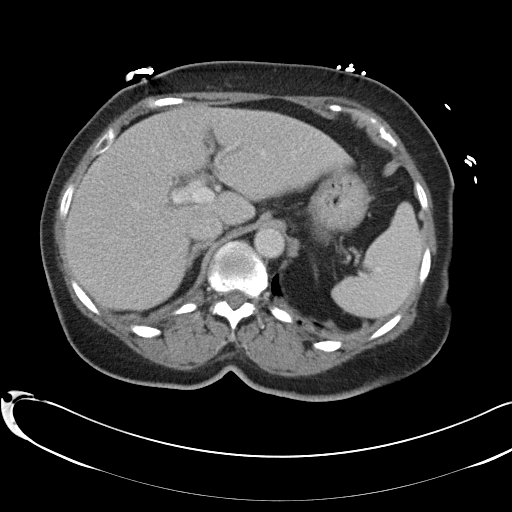
[im 73/86  soft-tissue]
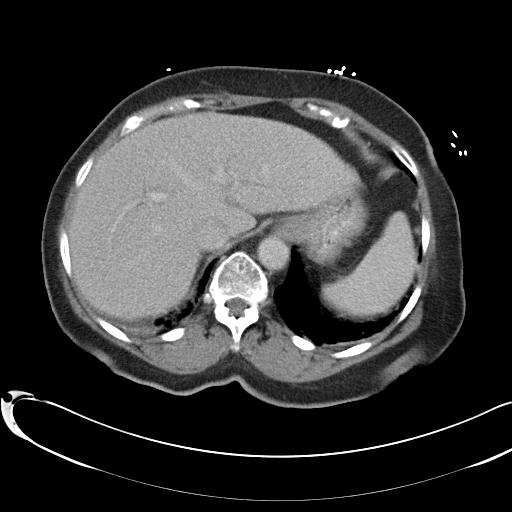
[im 81/86  soft-tissue]
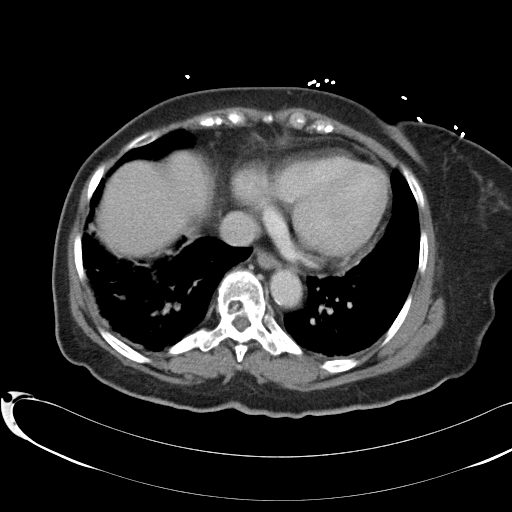

[Series 602: cor · coronal · 0.83mm/px · 3 of 126 slices shown]
[im 42/126  soft-tissue]
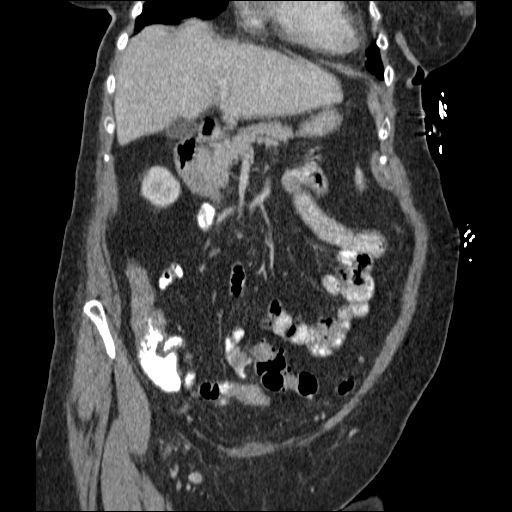
[im 56/126  soft-tissue]
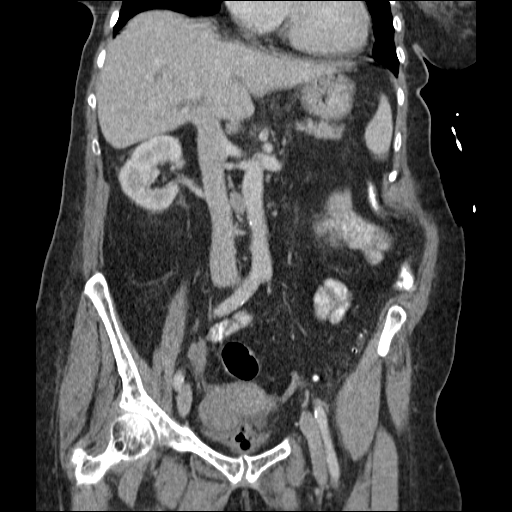
[im 70/126  soft-tissue]
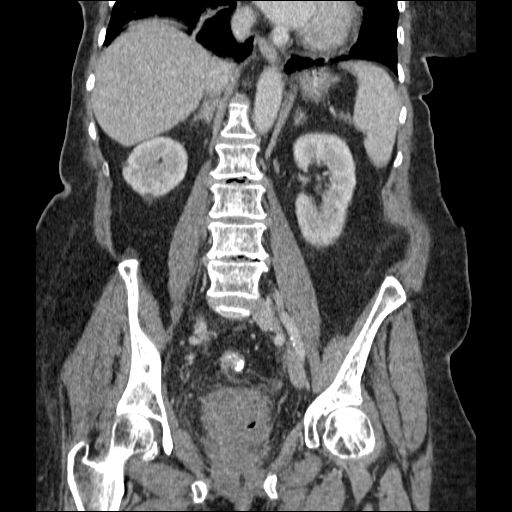

[17 of 46 positions shown; findings below may reference images not displayed]

FINDINGS: Images through the lung bases show mild right lower lobe
infiltrate, suspicious for pneumonia.

A tiny less than 1 cm right hepatic lobe cyst is noted but no liver
masses are identified.  Gallbladder is unremarkable.  The pancreas,
spleen, and adrenal glands are normal in appearance.  Tiny renal
cysts are noted bilaterally but there is no evidence of renal mass
or hydronephrosis.

A Foley catheter is seen within the bladder which is collapsed.  A
subserosal fibroid is seen in the right lower uterine body which
measures 4 cm.  Shotty bilateral iliac lymph nodes are seen within
the pelvis, none of which are pathologically enlarged.

Wall thickening is seen involving the inferior rectum and anus, and
there is soft tissue stranding seen within the presacral fat and
the ischiorectal fossae bilaterally.  This is suspicious for
proctocolitis.  No abscess identified. Sigmoid diverticulosis is
noted, however there is no evidence of diverticulitis.
IMPRESSION: 1.  Findings consistent with proctocolitis.  No evidence of
abscess.
2.  4 cm uterine fibroid.
3.  Sigmoid  diverticulosis.  No radiographic evidence of
diverticulitis.
4.  Mild asymmetric lower lobe infiltrate; pneumonia cannot be
excluded.  Recommend clinical correlation and follow-up by chest
radiograph.

## 2012-02-12 IMAGING — CR DG CHEST 1V PORT
1 series · 1 of 1 positions shown · non-contrast
Comparison: 08/20/2011

CLINICAL DATA: Short of breath and wheezing.

PORTABLE CHEST - 1 VIEW

[AP]
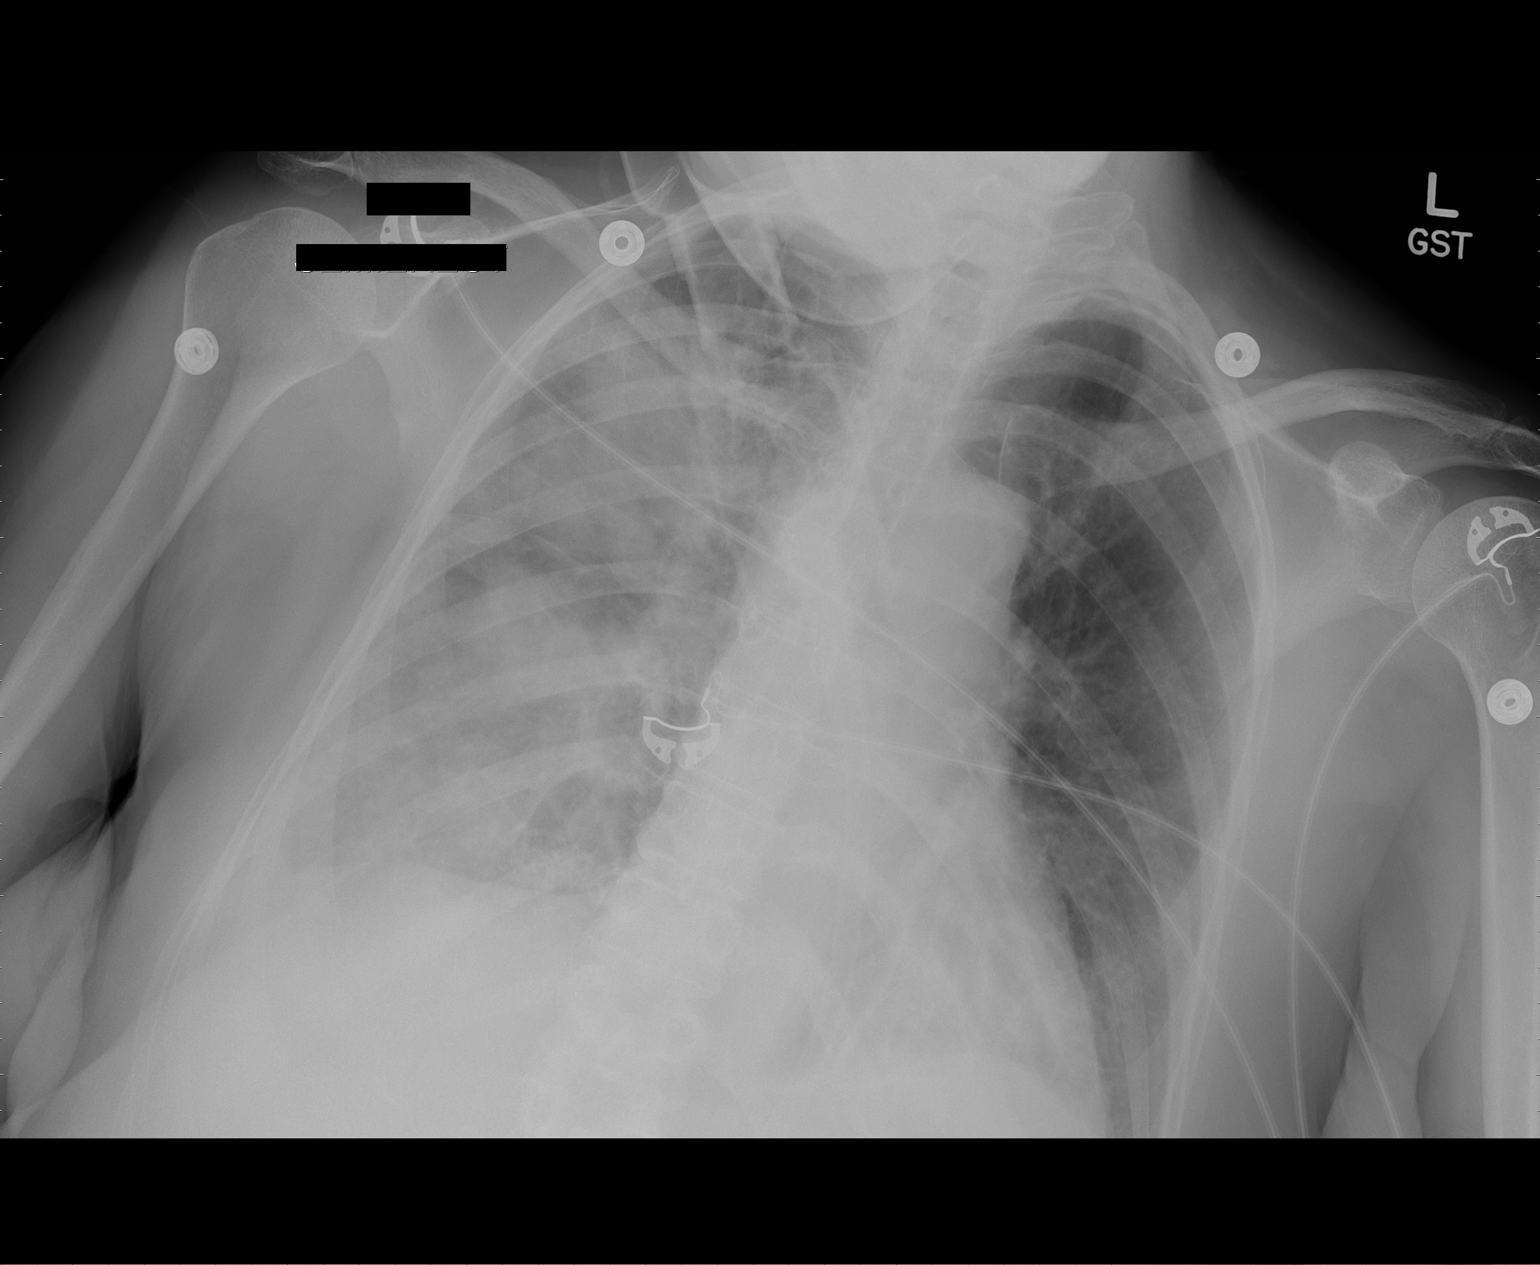

[1 of 1 positions shown; findings below may reference images not displayed]

FINDINGS: There is marked interval change in appearance of the
chest with extensive airspace infiltrates now present in the right
upper and lower lung zones.  Findings are consistent with pneumonia
and may be on the basis of aspiration.  The left lung is reasonably
clear with some mild atelectasis in the left lower lobe.  No
pleural effusions identified.
IMPRESSION: Significant change in appearance of the chest with extensive
pneumonia now present throughout the right lung.  This could be on
the basis of aspiration.

## 2012-02-13 IMAGING — CR DG CHEST 1V PORT
1 series · 1 of 1 positions shown · non-contrast
Comparison: 08/22/2011; 08/20/2011

CLINICAL DATA: Evaluate pulmonary edema, shortness of breath

PORTABLE CHEST - 1 VIEW

[AP]
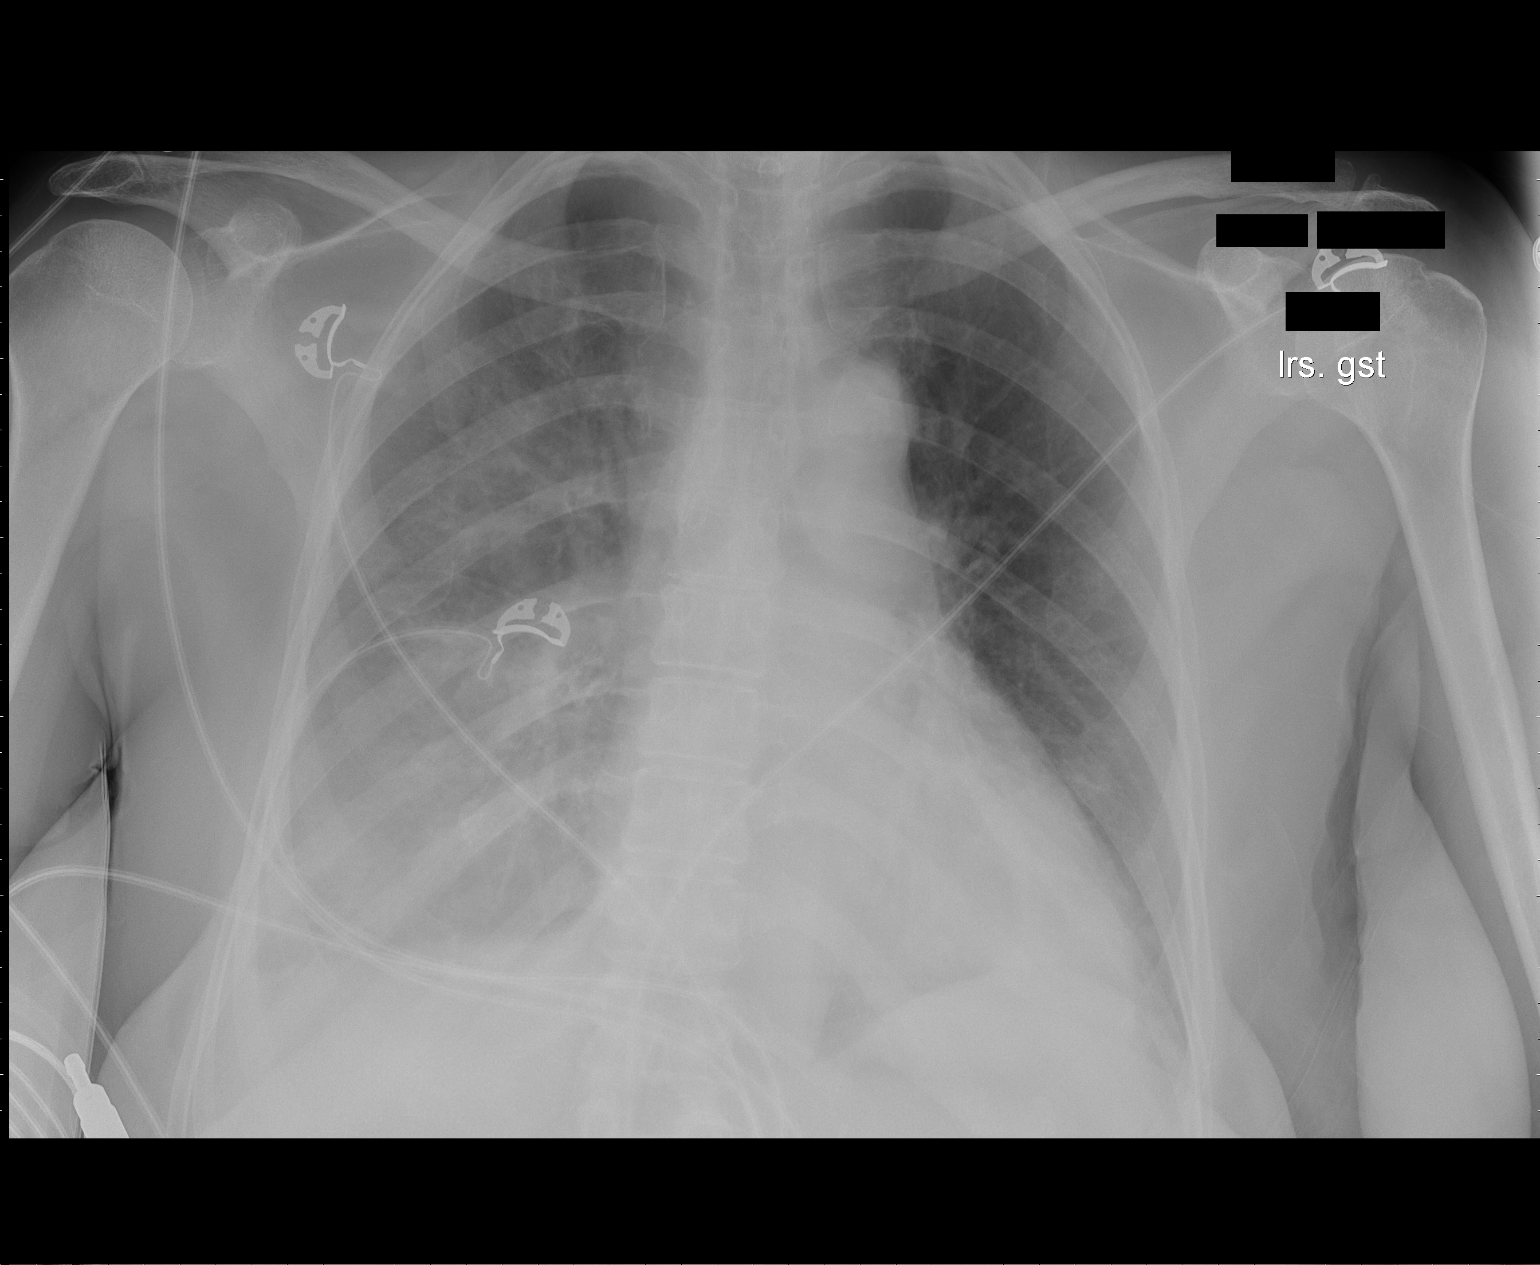

[1 of 1 positions shown; findings below may reference images not displayed]

FINDINGS: Grossly unchanged borderline enlarged cardiac silhouette
and mediastinal contours.  Improved aeration of the right upper and
mid lung with persistent ill-defined heterogeneous opacities in the
right lower lung.  There is persistent blunting right costophrenic
angle which may suggest a small residual right sided effusion.
Grossly unchanged left basilar heterogeneous opacities.  Unchanged
bones.
IMPRESSION: 1.  Improved aeration of the right upper and mid lung which given
rapid interval change is favored to represent improving pulmonary
edema.
2.  Persistent opacities within the right lung base may represent
residual asymmetric pulmonary edema, though underlying infection is
not excluded. Continued attention on follow-up is recommended.

## 2013-01-03 ENCOUNTER — Emergency Department (HOSPITAL_COMMUNITY): Payer: Medicare Other

## 2013-01-03 ENCOUNTER — Encounter (HOSPITAL_COMMUNITY): Payer: Self-pay | Admitting: Emergency Medicine

## 2013-01-03 ENCOUNTER — Emergency Department (HOSPITAL_COMMUNITY)
Admission: EM | Admit: 2013-01-03 | Discharge: 2013-01-03 | Disposition: A | Discharge status: Home / Self Care | Payer: Medicare Other | Attending: Emergency Medicine | Admitting: Emergency Medicine

## 2013-01-03 DIAGNOSIS — Z7901 Long term (current) use of anticoagulants: Secondary | ICD-10-CM | POA: Insufficient documentation

## 2013-01-03 DIAGNOSIS — F3289 Other specified depressive episodes: Secondary | ICD-10-CM | POA: Insufficient documentation

## 2013-01-03 DIAGNOSIS — G40909 Epilepsy, unspecified, not intractable, without status epilepticus: Secondary | ICD-10-CM | POA: Insufficient documentation

## 2013-01-03 DIAGNOSIS — Z7982 Long term (current) use of aspirin: Secondary | ICD-10-CM | POA: Insufficient documentation

## 2013-01-03 DIAGNOSIS — W050XXA Fall from non-moving wheelchair, initial encounter: Secondary | ICD-10-CM | POA: Insufficient documentation

## 2013-01-03 DIAGNOSIS — I10 Essential (primary) hypertension: Secondary | ICD-10-CM | POA: Insufficient documentation

## 2013-01-03 DIAGNOSIS — K219 Gastro-esophageal reflux disease without esophagitis: Secondary | ICD-10-CM | POA: Insufficient documentation

## 2013-01-03 DIAGNOSIS — W19XXXA Unspecified fall, initial encounter: Secondary | ICD-10-CM

## 2013-01-03 DIAGNOSIS — Z8719 Personal history of other diseases of the digestive system: Secondary | ICD-10-CM | POA: Insufficient documentation

## 2013-01-03 DIAGNOSIS — S0083XA Contusion of other part of head, initial encounter: Secondary | ICD-10-CM | POA: Insufficient documentation

## 2013-01-03 DIAGNOSIS — S0003XA Contusion of scalp, initial encounter: Secondary | ICD-10-CM | POA: Insufficient documentation

## 2013-01-03 DIAGNOSIS — F329 Major depressive disorder, single episode, unspecified: Secondary | ICD-10-CM | POA: Insufficient documentation

## 2013-01-03 DIAGNOSIS — Z8679 Personal history of other diseases of the circulatory system: Secondary | ICD-10-CM | POA: Insufficient documentation

## 2013-01-03 DIAGNOSIS — Z8673 Personal history of transient ischemic attack (TIA), and cerebral infarction without residual deficits: Secondary | ICD-10-CM | POA: Insufficient documentation

## 2013-01-03 DIAGNOSIS — Y9289 Other specified places as the place of occurrence of the external cause: Secondary | ICD-10-CM | POA: Insufficient documentation

## 2013-01-03 DIAGNOSIS — Z79899 Other long term (current) drug therapy: Secondary | ICD-10-CM | POA: Insufficient documentation

## 2013-01-03 DIAGNOSIS — Y939 Activity, unspecified: Secondary | ICD-10-CM | POA: Insufficient documentation

## 2013-01-03 LAB — POCT I-STAT, CHEM 8
BUN: 19 mg/dL (ref 6–23)
Chloride: 103 mEq/L (ref 96–112)
Creatinine, Ser: 1.2 mg/dL — ABNORMAL HIGH (ref 0.50–1.10)
Sodium: 142 mEq/L (ref 135–145)
TCO2: 30 mmol/L (ref 0–100)

## 2013-01-03 MED ORDER — ACETAMINOPHEN 325 MG PO TABS
650.0000 mg | ORAL_TABLET | Freq: Once | ORAL | Status: AC
  Administered 2013-01-03: 650 mg via ORAL
  Filled 2013-01-03: qty 2

## 2013-01-03 MED ORDER — METOPROLOL TARTRATE 25 MG PO TABS
100.0000 mg | ORAL_TABLET | Freq: Once | ORAL | Status: AC
  Administered 2013-01-03: 100 mg via ORAL
  Filled 2013-01-03: qty 4

## 2013-01-03 NOTE — ED Provider Notes (Signed)
[name] S 8:00 PM patient discussed in sign out with Dr. Adriana Simas. Patient is a 77 year old female with previous history of stroke and significant deficits who is a phasic and presenting from nursing home after a fall from wheelchair. Patient has large hematoma around the right side. CT scans of head face and neck ordered.  Patient is also on Coumadin and I will also order basic i-STAT chem 8 with PT/INR.  8:20 PM nurse informs me that patient's blood pressure is elevated. It was also elevated upon initial triage. Patient has known hypertension did not receive her evening dose of metoprolol 100 mg. We will give this for the blood pressure. May also be elevated due to injury and pain. Tylenol will be given for pain.  CT scans negative. Labs unremarkable. Patient stable for discharge home. Family and patient informed of findings today. They agree with plan and are ready to return.  Angus Seller, PA-C 01/03/13 2112

## 2013-01-03 NOTE — ED Notes (Signed)
Pt unable to rate pain due to dementia. Visible bruising and hematoma to rt eye. Pt not able to tell what happened to her prior to arrival.

## 2013-01-03 NOTE — ED Provider Notes (Signed)
History     CSN: [id number]  Arrival date & time 01/03/13  1736   First MD Initiated Contact with Patient 01/03/13 1750      Chief Complaint  Patient presents with  . Fall    (Consider location/radiation/quality/duration/timing/severity/associated sxs/prior treatment) HPI.... level V caveat secondary to aphasia.   Patient lives at [facility]. She apparently fell out of her wheelchair and struck the right side of her face.  Family reports normal behavior. She has a large hematoma surrounding the right eye. No neck stiffness, extremity pain, or behavioral changes.  Past Medical History  Diagnosis Date  . Depressive disorder, not elsewhere classified   . Atrial fibrillation   . Unspecified transient cerebral ischemia   . Unspecified cerebral artery occlusion with cerebral infarction   . Unspecified essential hypertension   . Diverticulosis of colon (without mention of hemorrhage)   . Stricture and stenosis of esophagus   . Seizure disorder   . GERD (gastroesophageal reflux disease)   . IBS (irritable bowel syndrome)     Past Surgical History  Procedure Laterality Date  . Hemorrhoid surgery    . Benign braintumor      Removal  . Flexible sigmoidoscopy  09/02/2011    Procedure: FLEXIBLE SIGMOIDOSCOPY;  Surgeon: Erick Blinks, MD;  Location[facility] ENDOSCOPY;  Service: Gastroenterology;  Laterality: N/A;  . Hip arthroplasty  11/26/2011    Procedure: ARTHROPLASTY BIPOLAR HIP;  Surgeon: Harvie Junior, MD;  Location: [facility] ORS;  Service: Orthopedics;  Laterality: Right;    Family History  Problem Relation Age of Onset  . Heart disease Mother   . Colon cancer Neg Hx     History  Substance Use Topics  . Smoking status: Never Smoker   . Smokeless tobacco: Never Used  . Alcohol Use: No    OB History   Grav Para Term Preterm Abortions TAB SAB Ect Mult Living                  Review of Systems  Unable to perform ROS: Other    Allergies  Ciprofloxacin; Codeine; Levetiracetam;  Penicillins; and Phenytoin sodium extended  Home Medications   Current Outpatient Rx  Name  Route  Sig  Dispense  Refill  . aspirin (ASPIRIN EC) 81 MG EC tablet   Oral   Take 81 mg by mouth daily. Swallow whole.         Marland Kitchen atorvastatin (LIPITOR) 40 MG tablet   Oral   Take 40 mg by mouth daily.         . barrier cream (NON-SPECIFIED) CREA   Topical   Apply 1 application topically 3 (three) times daily as needed.   1 each   1   . carbamazepine (TEGRETOL) 200 MG tablet   Oral   Take 200 mg by mouth 2 (two) times daily.           . cholecalciferol (VITAMIN D) 1000 UNITS tablet   Oral   Take 4,000 Units by mouth daily.           . citalopram (CELEXA) 10 MG tablet   Oral   Take 20 mg by mouth daily.         . cloNIDine (CATAPRES) 0.1 MG tablet   Oral   Take 0.1 mg by mouth every 6 (six) hours as needed (Systolic BP over 409).         . conjugated estrogens (PREMARIN) vaginal cream   Vaginal   Place 30 g vaginally  daily.           . diltiazem (TIAZAC) 120 MG 24 hr capsule   Oral   Take 120 mg by mouth daily.         . furosemide (LASIX) 20 MG tablet   Oral   Take 1 tablet (20 mg total) by mouth daily.         . hyoscyamine (LEVSIN SL) 0.125 MG SL tablet   Sublingual   Place 0.125 mg under the tongue every 4 (four) hours as needed. For stomach cramps          . lisinopril (PRINIVIL,ZESTRIL) 40 MG tablet   Oral   Take 40 mg by mouth daily.          Marland Kitchen LORazepam (ATIVAN) 1 MG tablet   Oral   Take 0.5 mg by mouth at bedtime as needed.         . metoprolol (LOPRESSOR) 100 MG tablet   Oral   Take 1 tablet (100 mg total) by mouth 2 (two) times daily.   1 tablet   1   . nitrofurantoin (MACRODANTIN) 100 MG capsule   Oral   Take 100 mg by mouth at bedtime.         . NON FORMULARY   Oral   Take 60 mLs by mouth as directed. "medpass" taken with medications 2-3 times a day         . nystatin (NYSTOP) 100000 UNIT/GM POWD   Topical    Apply 1 g (100,000 Units total) topically 3 (three) times daily. Apply to affected back,thigh-peri rectal skin   1 Bottle   1   . ondansetron (ZOFRAN) 4 MG tablet   Oral   Take 4 mg by mouth at bedtime.          Marland Kitchen saccharomyces boulardii (FLORASTOR) 250 MG capsule   Oral   Take 250 mg by mouth 2 (two) times daily.           Marland Kitchen warfarin (COUMADIN) 4 MG tablet   Oral   Take 2-4 mg by mouth daily. Take 2 mg (1/2 tab) on Mon and Fri, and 4 mg on Sun, Tues, Wed, Thur, and Sat           There were no vitals taken for this visit.  Physical Exam  Nursing note and vitals reviewed. Constitutional: She is oriented to person, place, and time.  Unable to answer questions, but smiles and nods  HENT:  Head: Normocephalic.  Obvious right-sided periorbital hematoma greater on the lateral aspect.   Extraocular movements intact  Eyes: Conjunctivae and EOM are normal. Pupils are equal, round, and reactive to light.  Neck: Normal range of motion. Neck supple.  Cardiovascular: Normal rate, regular rhythm and normal heart sounds.   Pulmonary/Chest: Effort normal and breath sounds normal.  Abdominal: Soft. Bowel sounds are normal.  Musculoskeletal: Normal range of motion.  Neurological: She is alert and oriented to person, place, and time.  Skin: Skin is warm and dry.  Psychiatric: She has a normal mood and affect.    ED Course  Procedures (including critical care time)  Labs Reviewed - No data to display No results found.   No diagnosis found.    MDM  Patient has obvious facial hematoma. Extraocular movements of the eyes are intact.  CT scan of head, face, neck all pending. Discussed with PA        Donnetta Hutching, MD 01/03/13 1949

## 2013-01-03 NOTE — ED Notes (Signed)
Discharge instructions reviewed with pt's family. Family verbalized understanding.  

## 2013-01-03 NOTE — ED Notes (Signed)
Per EMS pt had a un witnessed fall according to  staff at [facility] pt was found on the floor in her room with a hematoma to rt eye. Pt has hx of dementia and is unable to rate pain to head, but denies pain anywhere else on body. Staff states pt is A&O per norm. Vitals 150/100, Pulse 70 irregular.

## 2013-01-04 NOTE — ED Provider Notes (Signed)
Medical screening examination/treatment/procedure(s) were performed by non-physician practitioner and as supervising physician I was immediately available for consultation/collaboration.  Derwood Kaplan, MD 01/04/13 1816

## 2013-01-22 ENCOUNTER — Encounter (HOSPITAL_COMMUNITY): Payer: Self-pay | Admitting: Emergency Medicine

## 2013-01-22 ENCOUNTER — Emergency Department (HOSPITAL_COMMUNITY)
Admission: EM | Admit: 2013-01-22 | Discharge: 2013-01-22 | Disposition: A | Discharge status: Home / Self Care | Payer: Medicare Other | Attending: Emergency Medicine | Admitting: Emergency Medicine

## 2013-01-22 ENCOUNTER — Emergency Department (HOSPITAL_COMMUNITY): Payer: Medicare Other

## 2013-01-22 DIAGNOSIS — Y9289 Other specified places as the place of occurrence of the external cause: Secondary | ICD-10-CM | POA: Insufficient documentation

## 2013-01-22 DIAGNOSIS — Y9389 Activity, other specified: Secondary | ICD-10-CM | POA: Insufficient documentation

## 2013-01-22 DIAGNOSIS — Z8719 Personal history of other diseases of the digestive system: Secondary | ICD-10-CM | POA: Insufficient documentation

## 2013-01-22 DIAGNOSIS — Z8673 Personal history of transient ischemic attack (TIA), and cerebral infarction without residual deficits: Secondary | ICD-10-CM | POA: Insufficient documentation

## 2013-01-22 DIAGNOSIS — I10 Essential (primary) hypertension: Secondary | ICD-10-CM | POA: Insufficient documentation

## 2013-01-22 DIAGNOSIS — Z7901 Long term (current) use of anticoagulants: Secondary | ICD-10-CM | POA: Insufficient documentation

## 2013-01-22 DIAGNOSIS — IMO0002 Reserved for concepts with insufficient information to code with codable children: Secondary | ICD-10-CM | POA: Insufficient documentation

## 2013-01-22 DIAGNOSIS — F329 Major depressive disorder, single episode, unspecified: Secondary | ICD-10-CM | POA: Insufficient documentation

## 2013-01-22 DIAGNOSIS — K219 Gastro-esophageal reflux disease without esophagitis: Secondary | ICD-10-CM | POA: Insufficient documentation

## 2013-01-22 DIAGNOSIS — F3289 Other specified depressive episodes: Secondary | ICD-10-CM | POA: Insufficient documentation

## 2013-01-22 DIAGNOSIS — G40909 Epilepsy, unspecified, not intractable, without status epilepticus: Secondary | ICD-10-CM | POA: Insufficient documentation

## 2013-01-22 DIAGNOSIS — S01312A Laceration without foreign body of left ear, initial encounter: Secondary | ICD-10-CM

## 2013-01-22 DIAGNOSIS — Z7982 Long term (current) use of aspirin: Secondary | ICD-10-CM | POA: Insufficient documentation

## 2013-01-22 DIAGNOSIS — Z8679 Personal history of other diseases of the circulatory system: Secondary | ICD-10-CM | POA: Insufficient documentation

## 2013-01-22 DIAGNOSIS — S01309A Unspecified open wound of unspecified ear, initial encounter: Secondary | ICD-10-CM | POA: Insufficient documentation

## 2013-01-22 DIAGNOSIS — Z79899 Other long term (current) drug therapy: Secondary | ICD-10-CM | POA: Insufficient documentation

## 2013-01-22 DIAGNOSIS — W19XXXA Unspecified fall, initial encounter: Secondary | ICD-10-CM

## 2013-01-22 LAB — PROTIME-INR: INR: 1.95 — ABNORMAL HIGH (ref 0.00–1.49)

## 2013-01-22 MED ORDER — METOPROLOL TARTRATE 25 MG PO TABS
100.0000 mg | ORAL_TABLET | Freq: Once | ORAL | Status: AC
  Administered 2013-01-22: 100 mg via ORAL
  Filled 2013-01-22: qty 4

## 2013-01-22 NOTE — ED Provider Notes (Signed)
History    Level V CSN: [id number]  Arrival date & time 01/22/13  1545   First MD Initiated Contact with Patient 01/22/13 1552      Chief Complaint  Patient presents with  . Fall    (Consider location/radiation/quality/duration/timing/severity/associated sxs/prior treatment) HPI This is a 77 year old female who presents from assisted living care facility with report that she fell out of bed this morning. She is nonambulatory but does transfer herself to the wheelchair. Patient is a phasic and is unable to give history. Report is received from son and from EMS. Patient has recently been seen for a previous fall. She is on anticoagulants. There is no report of loss of consciousness. She is reported to have lacerations to the left ear. No other injuries are reported. Past Medical History  Diagnosis Date  . Depressive disorder, not elsewhere classified   . Atrial fibrillation   . Unspecified transient cerebral ischemia   . Unspecified cerebral artery occlusion with cerebral infarction   . Unspecified essential hypertension   . Diverticulosis of colon (without mention of hemorrhage)   . Stricture and stenosis of esophagus   . Seizure disorder   . GERD (gastroesophageal reflux disease)   . IBS (irritable bowel syndrome)     Past Surgical History  Procedure Laterality Date  . Hemorrhoid surgery    . Benign braintumor      Removal  . Flexible sigmoidoscopy  09/02/2011    Procedure: FLEXIBLE SIGMOIDOSCOPY;  Surgeon: Erick Blinks, MD;  Location: [facility] ENDOSCOPY;  Service: Gastroenterology;  Laterality: N/A;  . Hip arthroplasty  11/26/2011    Procedure: ARTHROPLASTY BIPOLAR HIP;  Surgeon: Harvie Junior, MD;  Location: [facility] ORS;  Service: Orthopedics;  Laterality: Right;    Family History  Problem Relation Age of Onset  . Heart disease Mother   . Colon cancer Neg Hx     History  Substance Use Topics  . Smoking status: Never Smoker   . Smokeless tobacco: Never Used  . Alcohol Use: No     OB History   Grav Para Term Preterm Abortions TAB SAB Ect Mult Living                  Review of Systems  Unable to perform ROS   Allergies  Ciprofloxacin; Codeine; Levetiracetam; Penicillins; and Phenytoin sodium extended  Home Medications   Current Outpatient Rx  Name  Route  Sig  Dispense  Refill  . aspirin (ASPIRIN EC) 81 MG EC tablet   Oral   Take 81 mg by mouth daily. Swallow whole.         Marland Kitchen atorvastatin (LIPITOR) 40 MG tablet   Oral   Take 40 mg by mouth daily.         . carbamazepine (TEGRETOL) 200 MG tablet   Oral   Take 200 mg by mouth 2 (two) times daily.          . cholecalciferol (VITAMIN D) 1000 UNITS tablet   Oral   Take 4,000 Units by mouth daily.           . citalopram (CELEXA) 10 MG tablet   Oral   Take 20 mg by mouth daily.         . cloNIDine (CATAPRES) 0.1 MG tablet   Oral   Take 0.1 mg by mouth every 6 (six) hours as needed (Systolic BP over 956).         . conjugated estrogens (PREMARIN) vaginal cream   Vaginal  Place 30 g vaginally daily.           Marland Kitchen diltiazem (TIAZAC) 120 MG 24 hr capsule   Oral   Take 120 mg by mouth daily.         . furosemide (LASIX) 20 MG tablet   Oral   Take 1 tablet (20 mg total) by mouth daily.         . hyoscyamine (LEVSIN SL) 0.125 MG SL tablet   Sublingual   Place 0.125 mg under the tongue every 4 (four) hours as needed. For stomach cramps          . lisinopril (PRINIVIL,ZESTRIL) 40 MG tablet   Oral   Take 40 mg by mouth daily.          Marland Kitchen LORazepam (ATIVAN) 1 MG tablet   Oral   Take 0.5 mg by mouth at bedtime as needed for anxiety.          . metoprolol (LOPRESSOR) 100 MG tablet   Oral   Take 1 tablet (100 mg total) by mouth 2 (two) times daily.   1 tablet   1   . nitrofurantoin (MACRODANTIN) 100 MG capsule   Oral   Take 100 mg by mouth at bedtime.         Marland Kitchen oxyCODONE-acetaminophen (PERCOCET/ROXICET) 5-325 MG per tablet   Oral   Take 1 tablet by mouth  every 4 (four) hours as needed for pain.         Marland Kitchen saccharomyces boulardii (FLORASTOR) 250 MG capsule   Oral   Take 250 mg by mouth 2 (two) times daily.           Marland Kitchen warfarin (COUMADIN) 4 MG tablet   Oral   Take 2-4 mg by mouth daily. Take 2 mg (1/2 tab) on Sat  and Sun, and 4 mg on Mon, Tues, Wed, Thur, and Fri           BP 217/90  Pulse 83  Temp(Src) 99.1 F (37.3 C) (Oral)  Resp 25  SpO2 93%  Physical Exam  Nursing note and vitals reviewed. Constitutional: She is oriented to person, place, and time. She appears well-developed and well-nourished.  HENT:  Head: Normocephalic.  Mouth/Throat: Oropharynx is clear and moist.  Yellowish colored right. Orbital contusion consistent with old injury. (With 1 cm laceration on the external pinna 1 cm laceration on the posterior external pinna and 1 cm laceration at posterior bleeding is mild.  Eyes: Conjunctivae and EOM are normal. Pupils are equal, round, and reactive to light.  Neck: Normal range of motion. Neck supple.  Cardiovascular: Normal rate.   Pulmonary/Chest: Effort normal and breath sounds normal.  Abdominal: Soft. Bowel sounds are normal.  Musculoskeletal:  Cervical collar in place no tenderness to palpation over cervical, thoracic, or lumbar spine. Multiple old contusions of lower extremities. Full active range of motion of all extremities.  Neurological: She is alert and oriented to person, place, and time.  Skin: Skin is warm and dry.    ED Course  Procedures (including critical care time)  Labs Reviewed  PROTIME-INR - Abnormal; Notable for the following:    Prothrombin Time 21.5 (*)    INR 1.95 (*)    All other components within normal limits   Ct Head Wo Contrast  01/22/2013  *RADIOLOGY REPORT*  Clinical Data:  Larey Seat getting out of bed.  Head pain.  Neck pain.  CT HEAD WITHOUT CONTRAST CT CERVICAL SPINE WITHOUT CONTRAST  Technique:  Multidetector CT  imaging of the head and cervical spine was performed  following the standard protocol without intravenous contrast.  Multiplanar CT image reconstructions of the cervical spine were also generated.  Comparison:  01/03/2013.  CT HEAD  Findings: There is no evidence for acute infarction, intracranial hemorrhage, mass lesion, hydrocephalus, or extra-axial fluid. Advanced atrophy and chronic microvascular ischemic change. Cystic encephalomalacia is postoperative in the left frontal lobe, stable from priors.  No skull fracture.  Clear sinuses and mastoids.  IMPRESSION: Chronic changes as described.  No skull fracture or intracranial hemorrhage.  CT CERVICAL SPINE  Findings: No visible cervical spine fracture or traumatic subluxation.  Multilevel spondylosis is worst at C5-6 and C6-7. Skeletal osteopenia, without acute compression fracture. Vascular calcification.  Upper lung zones unremarkable.  IMPRESSION: Spondylosis as described.  No skull fracture or intracranial hemorrhage.   Original Report Authenticated By: Davonna Belling, M.D.    Ct Cervical Spine Wo Contrast  01/22/2013  *RADIOLOGY REPORT*  Clinical Data:  Larey Seat getting out of bed.  Head pain.  Neck pain.  CT HEAD WITHOUT CONTRAST CT CERVICAL SPINE WITHOUT CONTRAST  Technique:  Multidetector CT imaging of the head and cervical spine was performed following the standard protocol without intravenous contrast.  Multiplanar CT image reconstructions of the cervical spine were also generated.  Comparison:  01/03/2013.  CT HEAD  Findings: There is no evidence for acute infarction, intracranial hemorrhage, mass lesion, hydrocephalus, or extra-axial fluid. Advanced atrophy and chronic microvascular ischemic change. Cystic encephalomalacia is postoperative in the left frontal lobe, stable from priors.  No skull fracture.  Clear sinuses and mastoids.  IMPRESSION: Chronic changes as described.  No skull fracture or intracranial hemorrhage.  CT CERVICAL SPINE  Findings: No visible cervical spine fracture or traumatic subluxation.   Multilevel spondylosis is worst at C5-6 and C6-7. Skeletal osteopenia, without acute compression fracture. Vascular calcification.  Upper lung zones unremarkable.  IMPRESSION: Spondylosis as described.  No skull fracture or intracranial hemorrhage.   Original Report Authenticated By: Davonna Belling, M.D.      No diagnosis found.    CT of head reveals no evidence of intracranial fracture cervical spine CT reveals no evidence of fracture. Patient is hypertensive. This is being rechecked and she'll be given an extra dose of Lopressor. She is known to have chronic hypertension and this has been seen on her previous visit. Laceration repair is done per [facility]  Discussed possibility of injury to ear due to compression for bleeding. I discussed her son that this needs to be followed up and rechecked.         Hilario Quarry, MD 01/23/13 [phone]

## 2013-01-22 NOTE — ED Notes (Signed)
Per EMS: From [facility], Pt trying to get out of bed and slid hitting head on night stand, lac to back of left ear with a little tenderness to necks. Bruise from previous fall on right side of check. No LOC, no other complaints.

## 2013-01-22 NOTE — ED Provider Notes (Signed)
History/physical exam/procedure(s) were performed by non-physician practitioner and as supervising physician I was immediately available for consultation/collaboration. I have reviewed all notes and am in agreement with care and plan.   Hilario Quarry, MD 01/22/13 2250

## 2013-01-22 NOTE — ED Provider Notes (Signed)
LACERATION REPAIR Performed by: Raymon Mutton Authorized by: Raymon Mutton Consent: Verbal consent obtained. Risks and benefits: risks, benefits and alternatives were discussed Consent given by: patient Patient identity confirmed: provided demographic data Prepped and Draped in normal sterile fashion Wound explored  Laceration Location: left ear  Laceration Length: Three separate lacerations to left ear: 1 cm laceration near antihelical fold, 1 cm laceration to posterior auricle, 1 cm laceration to post-auricular fold  No Foreign Bodies seen or palpated  Anesthesia: local infiltration  Local anesthetic: lidocaine 2% without epinephrine  Anesthetic total: 3 ml  Irrigation method: syringe Amount of cleaning: standard  Skin closure: approximate  Number of sutures: 3, one suture in each laceration   Technique: single interrupted superficial  4-0 vicryl rapide used  Patient tolerance: Patient tolerated the procedure well with no immediate complications. Bleeding controlled. Site cleaned and banadages applied - ear propped with bandages.    Raymon Mutton, PA-C 01/22/13 2102

## 2013-01-22 NOTE — ED Notes (Signed)
Patient transported to CT 

## 2013-03-30 ENCOUNTER — Emergency Department (HOSPITAL_COMMUNITY): Payer: Medicare Other

## 2013-03-30 ENCOUNTER — Emergency Department (HOSPITAL_COMMUNITY)
Admission: EM | Admit: 2013-03-30 | Discharge: 2013-03-30 | Disposition: A | Discharge status: Home / Self Care | Payer: Medicare Other | Attending: Emergency Medicine | Admitting: Emergency Medicine

## 2013-03-30 ENCOUNTER — Encounter (HOSPITAL_COMMUNITY): Payer: Self-pay

## 2013-03-30 DIAGNOSIS — F3289 Other specified depressive episodes: Secondary | ICD-10-CM | POA: Insufficient documentation

## 2013-03-30 DIAGNOSIS — Z7901 Long term (current) use of anticoagulants: Secondary | ICD-10-CM | POA: Insufficient documentation

## 2013-03-30 DIAGNOSIS — G40909 Epilepsy, unspecified, not intractable, without status epilepticus: Secondary | ICD-10-CM | POA: Insufficient documentation

## 2013-03-30 DIAGNOSIS — I69920 Aphasia following unspecified cerebrovascular disease: Secondary | ICD-10-CM | POA: Insufficient documentation

## 2013-03-30 DIAGNOSIS — Z79899 Other long term (current) drug therapy: Secondary | ICD-10-CM | POA: Insufficient documentation

## 2013-03-30 DIAGNOSIS — N390 Urinary tract infection, site not specified: Secondary | ICD-10-CM | POA: Insufficient documentation

## 2013-03-30 DIAGNOSIS — I4891 Unspecified atrial fibrillation: Secondary | ICD-10-CM | POA: Insufficient documentation

## 2013-03-30 DIAGNOSIS — I10 Essential (primary) hypertension: Secondary | ICD-10-CM | POA: Insufficient documentation

## 2013-03-30 DIAGNOSIS — Z880 Allergy status to penicillin: Secondary | ICD-10-CM | POA: Insufficient documentation

## 2013-03-30 DIAGNOSIS — Z8719 Personal history of other diseases of the digestive system: Secondary | ICD-10-CM | POA: Insufficient documentation

## 2013-03-30 DIAGNOSIS — F329 Major depressive disorder, single episode, unspecified: Secondary | ICD-10-CM | POA: Insufficient documentation

## 2013-03-30 DIAGNOSIS — Z7982 Long term (current) use of aspirin: Secondary | ICD-10-CM | POA: Insufficient documentation

## 2013-03-30 HISTORY — DX: Cerebral infarction, unspecified: I63.9

## 2013-03-30 LAB — BASIC METABOLIC PANEL
BUN: 24 mg/dL — ABNORMAL HIGH (ref 6–23)
CO2: 34 mEq/L — ABNORMAL HIGH (ref 19–32)
Chloride: 101 mEq/L (ref 96–112)
Creatinine, Ser: 1.32 mg/dL — ABNORMAL HIGH (ref 0.50–1.10)
GFR calc Af Amer: 45 mL/min — ABNORMAL LOW (ref >90)
Potassium: 3.5 mEq/L (ref 3.5–5.1)

## 2013-03-30 LAB — URINALYSIS, ROUTINE W REFLEX MICROSCOPIC
Bilirubin Urine: NEGATIVE
Ketones, ur: NEGATIVE mg/dL
Specific Gravity, Urine: 1.016 (ref 1.005–1.030)
pH: 6.5 (ref 5.0–8.0)

## 2013-03-30 LAB — CBC WITH DIFFERENTIAL/PLATELET
Basophils Relative: 1 % (ref 0–1)
Eosinophils Relative: 2 % (ref 0–5)
HCT: 36.2 % (ref 36.0–46.0)
Hemoglobin: 12.5 g/dL (ref 12.0–15.0)
Lymphocytes Relative: 18 % (ref 12–46)
MCHC: 34.5 g/dL (ref 30.0–36.0)
MCV: 98.4 fL (ref 78.0–100.0)
Monocytes Absolute: 0.8 10*3/uL (ref 0.1–1.0)
Monocytes Relative: 11 % (ref 3–12)
Neutro Abs: 5.2 10*3/uL (ref 1.7–7.7)
RDW: 12.8 % (ref 11.5–15.5)

## 2013-03-30 LAB — URINE MICROSCOPIC-ADD ON

## 2013-03-30 MED ORDER — SODIUM CHLORIDE 0.9 % IV BOLUS (SEPSIS): 500.0000 mL | Freq: Once | INTRAVENOUS | Status: DC

## 2013-03-30 MED ORDER — CEFUROXIME AXETIL 500 MG PO TABS: 500.0000 mg | ORAL_TABLET | Freq: Two times a day (BID) | ORAL | Status: AC

## 2013-03-30 NOTE — ED Provider Notes (Signed)
History    CSN: [id number] Arrival date & time 03/30/13  1830  First MD Initiated Contact with Patient 03/30/13 1847     No chief complaint on file.  (Consider location/radiation/quality/duration/timing/severity/associated sxs/prior Treatment) HPI Comments: Pt w/ hx of CVA w/ aphasia and right sided deficit now w/ weakness and leaning to right. Nursing at SNF noted pt leaning. Per family this is not uncommon. No change from her baseline alertness. No new neuro changes. Family notes foul smelling uop. + hx of UTI. No fever or cough.  Patient is a 77 y.o. female presenting with general illness. The history is provided by a relative. No language interpreter was used.  Illness Location:  Global Quality:  Weakness Severity:  Mild Onset quality:  Gradual Duration:  1 day Timing:  Intermittent Progression:  Improving Chronicity:  New Associated symptoms: no abdominal pain, no chest pain, no congestion, no cough, no diarrhea, no fever, no headaches, no nausea, no rash, no shortness of breath, no sore throat and no vomiting    Past Medical History  Diagnosis Date  . Depressive disorder, not elsewhere classified   . Atrial fibrillation   . Unspecified transient cerebral ischemia   . Unspecified cerebral artery occlusion with cerebral infarction   . Unspecified essential hypertension   . Diverticulosis of colon (without mention of hemorrhage)   . Stricture and stenosis of esophagus   . Seizure disorder   . GERD (gastroesophageal reflux disease)   . IBS (irritable bowel syndrome)   . Stroke    Past Surgical History  Procedure Laterality Date  . Hemorrhoid surgery    . Benign braintumor      Removal  . Flexible sigmoidoscopy  09/02/2011    Procedure: FLEXIBLE SIGMOIDOSCOPY;  Surgeon: Erick Blinks, MD;  Location: [facility] ENDOSCOPY;  Service: Gastroenterology;  Laterality: N/A;  . Hip arthroplasty  11/26/2011    Procedure: ARTHROPLASTY BIPOLAR HIP;  Surgeon: Harvie Junior, MD;  Location: [facility] ORS;   Service: Orthopedics;  Laterality: Right;   Family History  Problem Relation Age of Onset  . Heart disease Mother   . Colon cancer Neg Hx    History  Substance Use Topics  . Smoking status: Never Smoker   . Smokeless tobacco: Never Used  . Alcohol Use: No   OB History   Grav Para Term Preterm Abortions TAB SAB Ect Mult Living                 Review of Systems  Constitutional: Negative for fever and chills.  HENT: Negative for congestion and sore throat.   Respiratory: Negative for cough and shortness of breath.   Cardiovascular: Negative for chest pain and leg swelling.  Gastrointestinal: Negative for nausea, vomiting, abdominal pain, diarrhea and constipation.  Genitourinary: Negative for dysuria and frequency.  Skin: Negative for color change and rash.  Neurological: Positive for weakness. Negative for dizziness and headaches.  Psychiatric/Behavioral: Negative for confusion and agitation.  All other systems reviewed and are negative.    Allergies  Ciprofloxacin; Codeine; Levetiracetam; Penicillins; and Phenytoin sodium extended  Home Medications   Current Outpatient Rx  Name  Route  Sig  Dispense  Refill  . aspirin (ASPIRIN EC) 81 MG EC tablet   Oral   Take 81 mg by mouth daily. Swallow whole.         Marland Kitchen atorvastatin (LIPITOR) 40 MG tablet   Oral   Take 40 mg by mouth daily.         . carbamazepine (  TEGRETOL) 200 MG tablet   Oral   Take 200 mg by mouth 2 (two) times daily.          . cholecalciferol (VITAMIN D) 1000 UNITS tablet   Oral   Take 4,000 Units by mouth daily.           . citalopram (CELEXA) 10 MG tablet   Oral   Take 20 mg by mouth daily.         . cloNIDine (CATAPRES) 0.1 MG tablet   Oral   Take 0.1 mg by mouth every 6 (six) hours as needed (Systolic BP over 409).         . conjugated estrogens (PREMARIN) vaginal cream   Vaginal   Place 30 g vaginally daily.           Marland Kitchen diltiazem (TIAZAC) 120 MG 24 hr capsule   Oral    Take 120 mg by mouth daily.         . furosemide (LASIX) 20 MG tablet   Oral   Take 1 tablet (20 mg total) by mouth daily.         . hyoscyamine (LEVSIN SL) 0.125 MG SL tablet   Sublingual   Place 0.125 mg under the tongue every 4 (four) hours as needed. For stomach cramps          . lisinopril (PRINIVIL,ZESTRIL) 40 MG tablet   Oral   Take 40 mg by mouth daily.          Marland Kitchen LORazepam (ATIVAN) 1 MG tablet   Oral   Take 0.5 mg by mouth at bedtime as needed for anxiety.          . metoprolol (LOPRESSOR) 100 MG tablet   Oral   Take 1 tablet (100 mg total) by mouth 2 (two) times daily.   1 tablet   1   . nitrofurantoin (MACRODANTIN) 100 MG capsule   Oral   Take 100 mg by mouth at bedtime.         Marland Kitchen oxyCODONE-acetaminophen (PERCOCET/ROXICET) 5-325 MG per tablet   Oral   Take 1 tablet by mouth every 4 (four) hours as needed for pain.         Marland Kitchen saccharomyces boulardii (FLORASTOR) 250 MG capsule   Oral   Take 250 mg by mouth 2 (two) times daily.           Marland Kitchen warfarin (COUMADIN) 4 MG tablet   Oral   Take 2-4 mg by mouth daily. Take 2 mg (1/2 tab) on Sat  and Sun, and 4 mg on Mon, Tues, Wed, Thur, and Fri          SpO2 98% Physical Exam  Vitals reviewed. Constitutional: She is oriented to person, place, and time. She appears well-developed and well-nourished. No distress.  HENT:  Head: Normocephalic and atraumatic.  Eyes: EOM are normal. Pupils are equal, round, and reactive to light.  Neck: Normal range of motion. Neck supple.  Cardiovascular: Normal rate and regular rhythm.   Pulmonary/Chest: Effort normal and breath sounds normal. No respiratory distress.  Abdominal: Soft. She exhibits no distension. There is no tenderness.  Musculoskeletal: Normal range of motion. She exhibits no edema.  Neurological: She is alert and oriented to person, place, and time. No cranial nerve deficit or sensory deficit. She exhibits abnormal muscle tone (4/5 right shoulder  abduction. 3/5 BLE hip flexion). GCS eye subscore is 4. GCS verbal subscore is 4. GCS motor subscore is 6.  Skin: Skin  is warm and dry.  Psychiatric: She has a normal mood and affect. Her behavior is normal.    ED Course  Procedures (including critical care time) Labs Reviewed  BASIC METABOLIC PANEL  CBC WITH DIFFERENTIAL  URINALYSIS, ROUTINE W REFLEX MICROSCOPIC  TROPONIN I   Date: 03/30/2013  Rate: 70  Rhythm: atrial fibrillation  QRS Axis: normal  Intervals: normal  ST/T Wave abnormalities: normal  Conduction Disutrbances:none  Narrative Interpretation:   Old EKG Reviewed: unchanged Results for orders placed during the hospital encounter of 03/30/13  BASIC METABOLIC PANEL      Result Value Range   Sodium 142  135 - 145 mEq/L   Potassium 3.5  3.5 - 5.1 mEq/L   Chloride 101  96 - 112 mEq/L   CO2 34 (*) 19 - 32 mEq/L   Glucose, Bld 103 (*) 70 - 99 mg/dL   BUN 24 (*) 6 - 23 mg/dL   Creatinine, Ser 9.56 (*) 0.50 - 1.10 mg/dL   Calcium 9.1  8.4 - 21.3 mg/dL   GFR calc non Af Amer 39 (*) >90 mL/min   GFR calc Af Amer 45 (*) >90 mL/min  CBC WITH DIFFERENTIAL      Result Value Range   WBC 7.4  4.0 - 10.5 K/uL   RBC 3.68 (*) 3.87 - 5.11 MIL/uL   Hemoglobin 12.5  12.0 - 15.0 g/dL   HCT 08.6  57.8 - 46.9 %   MCV 98.4  78.0 - 100.0 fL   MCH 34.0  26.0 - 34.0 pg   MCHC 34.5  30.0 - 36.0 g/dL   RDW 62.9  52.8 - 41.3 %   Platelets 241  150 - 400 K/uL   Neutrophils Relative % 69  43 - 77 %   Neutro Abs 5.2  1.7 - 7.7 K/uL   Lymphocytes Relative 18  12 - 46 %   Lymphs Abs 1.4  0.7 - 4.0 K/uL   Monocytes Relative 11  3 - 12 %   Monocytes Absolute 0.8  0.1 - 1.0 K/uL   Eosinophils Relative 2  0 - 5 %   Eosinophils Absolute 0.1  0.0 - 0.7 K/uL   Basophils Relative 1  0 - 1 %   Basophils Absolute 0.0  0.0 - 0.1 K/uL  URINALYSIS, ROUTINE W REFLEX MICROSCOPIC      Result Value Range   Color, Urine YELLOW  YELLOW   APPearance CLOUDY (*) CLEAR   Specific Gravity, Urine 1.016   1.005 - 1.030   pH 6.5  5.0 - 8.0   Glucose, UA NEGATIVE  NEGATIVE mg/dL   Hgb urine dipstick SMALL (*) NEGATIVE   Bilirubin Urine NEGATIVE  NEGATIVE   Ketones, ur NEGATIVE  NEGATIVE mg/dL   Protein, ur NEGATIVE  NEGATIVE mg/dL   Urobilinogen, UA 0.2  0.0 - 1.0 mg/dL   Nitrite POSITIVE (*) NEGATIVE   Leukocytes, UA SMALL (*) NEGATIVE  TROPONIN I      Result Value Range   Troponin I <0.30  <0.30 ng/mL  URINE MICROSCOPIC-ADD ON      Result Value Range   Squamous Epithelial / LPF RARE  RARE   WBC, UA 7-10  <3 WBC/hpf   RBC / HPF 0-2  <3 RBC/hpf   Bacteria, UA MANY (*) RARE   Casts HYALINE CASTS (*) NEGATIVE   Urine-Other MUCOUS PRESENT     CT Head Wo Contrast (Final result)  Result time: 03/30/13 19:47:57    Final result by Rad Results  In Interface (03/30/13 19:47:57)    Narrative:   *RADIOLOGY REPORT*  Clinical Data: New onset weakness, confusion.  CT HEAD WITHOUT CONTRAST  Technique: Contiguous axial images were obtained from the base of the skull through the vertex without contrast.  Comparison: CT 01/22/2013  Findings: Prior left frontal craniotomy. Underlying encephalomalacia within the left frontal lobe and marked dilatation of the frontal horn of the left lateral ventricle. Atrophy and chronic microvascular changes throughout the deep white matter. No change since prior study. No acute infarction. No hemorrhage, mass lesion or midline shift. No acute calvarial abnormality. Visualized paranasal sinuses and mastoids clear. Orbital soft tissues unremarkable.  IMPRESSION: No acute intracranial abnormality.  No change since prior study.   Original Report Authenticated By: Charlett Nose, M.D.             DG Chest 2 View (Final result)  Result time: 03/30/13 19:31:28    Final result by Rad Results In Interface (03/30/13 19:31:28)    Narrative:   *RADIOLOGY REPORT*  Clinical Data: Weakness. Atrial fibrillation. Seizures.  CHEST - 2 VIEW  Comparison:  11/26/2011  Findings: Lateral view degraded by patient arm position.  The lateral view is mildly obliqued.  Midline trachea. Borderline cardiomegaly. Tortuous descending thoracic aorta. No pleural effusion or pneumothorax. Chin overlies the apices medially. Clear lungs.  IMPRESSION: Borderline cardiomegaly, without acute disease.   Original Report Authenticated By: Jeronimo Greaves, M.D.           No results found. No diagnosis found.  MDM  Exam as above, possible subacute CVA, UTI, or pneumonia. BP elevated above baseline. Will screen for end organ injury w/ CT head, chest xray, BMP, CBC, u/a. ECG - unchanged from prior - rate controlled A fib. Will refrain from emergently lowering BP until definitive end organ damage identified.   Course: CT head - NAICA, CXR - NACPF - no infiltrate. u/a c/w UTI. BUN/Cr mild elevation - c/w pre-renal azotemia given 500cc IVF. Labs otherwise unremarkable, troponin neg, urine sent for Cx. Vitals stable - BP improved - mental status unchanged. No indication for admit to hospital. Stable for d/c back to SNF. Given rx for ceftin BID. FUP w/ pcp this wk for repeat BP check. Given return precautions. D/c in good condition  I have personally reviewed labs and imaging and considered in my MDM. Case d/w Dr Ethelda Chick     1. Urinary tract infection    Discharge Medication List as of 03/30/2013  8:49 PM    START taking these medications   Details  cefUROXime (CEFTIN) 500 MG tablet Take 1 tablet (500 mg total) by mouth 2 (two) times daily., Starting 03/30/2013, Last dose on Sun 04/05/13, Print       Gweneth Dimitri, MD [address] [place] [address] [phone]  Schedule an appointment as soon as possible for a visit on 04/06/2013      Audelia Hives, MD 03/31/13 [phone]

## 2013-03-30 NOTE — ED Notes (Signed)
Patient in xray at this time.

## 2013-03-30 NOTE — ED Provider Notes (Signed)
Patient sent by assisted living facility for supposedly weakness. Patient presently looks at baseline per her daughter who accompanies her.  Doug Sou, MD 03/30/13 2004

## 2013-03-30 NOTE — ED Notes (Signed)
Report called to Romeda at [facility].

## 2013-03-30 NOTE — ED Notes (Addendum)
Patient presents to ED via [facility]. Patient comes from [facility] assisted living. Staff called EMS stating that patient was eating dinner and started leaning to the right. Staff also states that she may have a UTI. Patient son contacted EMS and he told them that the leaning is her normal, she has a hx of muscle weakness and also a hx of aphasia. Patient son told EMS that he thinks she has a UTI. A&Ox2 upon arrival to ED. No acute distress noted.

## 2013-03-31 NOTE — ED Provider Notes (Signed)
I have personally seen and examined the patient.  I have discussed the plan of care with the resident.  I have reviewed the documentation on PMH/FH/Soc. History.  I have reviewed the documentation of the resident and agree.  [name], MD 03/31/13 1508 

## 2013-04-01 LAB — URINE CULTURE

## 2013-04-02 NOTE — Progress Notes (Signed)
ED Antimicrobial Stewardship Positive Culture Follow Up   [name] is an 77 y.o. female who presented to [facility] on 03/30/2013 with a chief complaint of R-sided weakness  Chief Complaint  Patient presents with  . Hypertension    Recent Results (from the past 720 hour(s))  URINE CULTURE     Status: None   Collection Time    03/30/13  8:19 PM      Result Value Range Status   Specimen Description URINE, CATHETERIZED   Final   Special Requests NONE   Final   Culture  Setup Time 03/30/2013 21:19   Final   Colony Count >=100,000 COLONIES/ML   Final   Culture [facility] MORGANII   Final   Report Status 04/01/2013 FINAL   Final   Organism ID, Bacteria MORGANELLA MORGANII   Final    [x]  Treated with Cefuroxime, organism not tested against prescribed antimicrobial []  Patient discharged originally without antimicrobial agent and treatment is now indicated  This patient was sent home on Cefuroxime -- which is a 2nd generation cephalosporin. Sensitivities report resistant to 1st generation cephalosporins (Cefazolin) and sensitivity to 3rd generation cephalosporins (Ceftriaxone). It is likely that the Ceftin will cover the patient's Morganella UTI -- however this cannot be confirmed since it was not tested for.   Would recommend to call the patient (or RN at ALF): * If symptoms improving/resolving >> would recommend continuing treatment with Cefuroxime as prescribed * If symptoms not resolving >> would recommend discontinuing Cefuroxime and changing to Levaquin 250 mg daily x 3 days. The patient would need close monitoring of INR if transitioned to Levaquin since it has been known to increase warfarin sensitivity (increase INR)  New antibiotic prescription: See options above  ED Provider: Marlon Pel, PA-C  Rolley Sims 04/02/2013, 9:58 AM Infectious Diseases Pharmacist Phone# [phone]

## 2013-04-02 NOTE — ED Notes (Signed)
Patient is at [facility] -Copy of labs faxed to the attention of Loretta-[phone]

## 2013-08-13 ENCOUNTER — Emergency Department (HOSPITAL_COMMUNITY)
Admission: EM | Admit: 2013-08-13 | Discharge: 2013-08-14 | Disposition: A | Discharge status: Skilled Nursing Facility | Payer: Medicare Other | Attending: Emergency Medicine | Admitting: Emergency Medicine

## 2013-08-13 ENCOUNTER — Emergency Department (HOSPITAL_COMMUNITY): Payer: Medicare Other

## 2013-08-13 ENCOUNTER — Encounter (HOSPITAL_COMMUNITY): Payer: Self-pay | Admitting: Radiology

## 2013-08-13 DIAGNOSIS — I509 Heart failure, unspecified: Secondary | ICD-10-CM | POA: Diagnosis not present

## 2013-08-13 DIAGNOSIS — G40909 Epilepsy, unspecified, not intractable, without status epilepticus: Secondary | ICD-10-CM | POA: Insufficient documentation

## 2013-08-13 DIAGNOSIS — Z862 Personal history of diseases of the blood and blood-forming organs and certain disorders involving the immune mechanism: Secondary | ICD-10-CM | POA: Insufficient documentation

## 2013-08-13 DIAGNOSIS — Z880 Allergy status to penicillin: Secondary | ICD-10-CM | POA: Insufficient documentation

## 2013-08-13 DIAGNOSIS — Z8673 Personal history of transient ischemic attack (TIA), and cerebral infarction without residual deficits: Secondary | ICD-10-CM | POA: Insufficient documentation

## 2013-08-13 DIAGNOSIS — Z7901 Long term (current) use of anticoagulants: Secondary | ICD-10-CM | POA: Diagnosis not present

## 2013-08-13 DIAGNOSIS — R29810 Facial weakness: Secondary | ICD-10-CM | POA: Diagnosis present

## 2013-08-13 DIAGNOSIS — F329 Major depressive disorder, single episode, unspecified: Secondary | ICD-10-CM | POA: Insufficient documentation

## 2013-08-13 DIAGNOSIS — R569 Unspecified convulsions: Secondary | ICD-10-CM

## 2013-08-13 DIAGNOSIS — Z86011 Personal history of benign neoplasm of the brain: Secondary | ICD-10-CM | POA: Insufficient documentation

## 2013-08-13 DIAGNOSIS — E785 Hyperlipidemia, unspecified: Secondary | ICD-10-CM | POA: Diagnosis not present

## 2013-08-13 DIAGNOSIS — I4891 Unspecified atrial fibrillation: Secondary | ICD-10-CM | POA: Insufficient documentation

## 2013-08-13 DIAGNOSIS — Z7982 Long term (current) use of aspirin: Secondary | ICD-10-CM | POA: Insufficient documentation

## 2013-08-13 DIAGNOSIS — R5381 Other malaise: Secondary | ICD-10-CM | POA: Insufficient documentation

## 2013-08-13 DIAGNOSIS — I10 Essential (primary) hypertension: Secondary | ICD-10-CM | POA: Diagnosis not present

## 2013-08-13 DIAGNOSIS — F3289 Other specified depressive episodes: Secondary | ICD-10-CM | POA: Insufficient documentation

## 2013-08-13 DIAGNOSIS — M6281 Muscle weakness (generalized): Secondary | ICD-10-CM

## 2013-08-13 DIAGNOSIS — R4701 Aphasia: Secondary | ICD-10-CM

## 2013-08-13 DIAGNOSIS — Z8719 Personal history of other diseases of the digestive system: Secondary | ICD-10-CM | POA: Insufficient documentation

## 2013-08-13 HISTORY — DX: Heart failure, unspecified: I50.9

## 2013-08-13 HISTORY — DX: Anemia, unspecified: D64.9

## 2013-08-13 HISTORY — DX: Hyperlipidemia, unspecified: E78.5

## 2013-08-13 LAB — DIFFERENTIAL
Basophils Relative: 1 % (ref 0–1)
Eosinophils Absolute: 0.1 10*3/uL (ref 0.0–0.7)
Eosinophils Relative: 1 % (ref 0–5)
Lymphs Abs: 1.7 10*3/uL (ref 0.7–4.0)
Monocytes Relative: 11 % (ref 3–12)
Neutro Abs: 5.9 10*3/uL (ref 1.7–7.7)
Neutrophils Relative %: 67 % (ref 43–77)

## 2013-08-13 LAB — CBC
HCT: 38.7 % (ref 36.0–46.0)
Hemoglobin: 13.4 g/dL (ref 12.0–15.0)
MCH: 34.5 pg — ABNORMAL HIGH (ref 26.0–34.0)
Platelets: 254 10*3/uL (ref 150–400)
RBC: 3.88 MIL/uL (ref 3.87–5.11)
RDW: 13.4 % (ref 11.5–15.5)

## 2013-08-13 LAB — COMPREHENSIVE METABOLIC PANEL
ALT: 11 U/L (ref 0–35)
AST: 16 U/L (ref 0–37)
Albumin: 3.7 g/dL (ref 3.5–5.2)
Alkaline Phosphatase: 95 U/L (ref 39–117)
Calcium: 9.3 mg/dL (ref 8.4–10.5)
Chloride: 101 mEq/L (ref 96–112)
Glucose, Bld: 109 mg/dL — ABNORMAL HIGH (ref 70–99)
Potassium: 3 mEq/L — ABNORMAL LOW (ref 3.5–5.1)
Sodium: 141 mEq/L (ref 135–145)
Total Protein: 7.1 g/dL (ref 6.0–8.3)

## 2013-08-13 LAB — POCT I-STAT, CHEM 8
BUN: 18 mg/dL (ref 6–23)
Chloride: 103 mEq/L (ref 96–112)
Creatinine, Ser: 1.5 mg/dL — ABNORMAL HIGH (ref 0.50–1.10)
HCT: 40 % (ref 36.0–46.0)
Potassium: 3 mEq/L — ABNORMAL LOW (ref 3.5–5.1)
Sodium: 140 mEq/L (ref 135–145)

## 2013-08-13 LAB — APTT: aPTT: 30 seconds (ref 24–37)

## 2013-08-13 LAB — PHENYTOIN LEVEL, TOTAL: Phenytoin Lvl: <2.5 ug/mL — ABNORMAL LOW (ref 10.0–20.0)

## 2013-08-13 LAB — PROTIME-INR
INR: 1.91 — ABNORMAL HIGH (ref 0.00–1.49)
Prothrombin Time: 21.3 seconds — ABNORMAL HIGH (ref 11.6–15.2)

## 2013-08-13 LAB — CARBAMAZEPINE LEVEL, TOTAL: Carbamazepine Lvl: 7.5 ug/mL (ref 4.0–12.0)

## 2013-08-13 MED ORDER — POTASSIUM CHLORIDE CRYS ER 20 MEQ PO TBCR
40.0000 meq | EXTENDED_RELEASE_TABLET | Freq: Once | ORAL | Status: AC
  Administered 2013-08-13: 40 meq via ORAL
  Filled 2013-08-13: qty 2

## 2013-08-13 MED ORDER — CLONIDINE HCL 0.1 MG PO TABS
0.1000 mg | ORAL_TABLET | Freq: Once | ORAL | Status: AC
  Administered 2013-08-13: 0.1 mg via ORAL
  Filled 2013-08-13: qty 1

## 2013-08-13 NOTE — ED Notes (Signed)
Pt placed on bedpan but did not void.  Pt is normally incontinent.

## 2013-08-13 NOTE — ED Notes (Signed)
Pt returned from MRI.  Son at bedside.  Pt continues to deny any pain or discomfort.  Pt's son st's pt's speech is normal for her.

## 2013-08-13 NOTE — ED Notes (Signed)
Patient transported to MRI 

## 2013-08-13 NOTE — ED Provider Notes (Signed)
CSN: [id number]     Arrival date & time 08/13/13  1737 History   First MD Initiated Contact with Patient 08/13/13 1746     Chief Complaint  Patient presents with  . Code Stroke   (Consider location/radiation/quality/duration/timing/severity/associated sxs/prior Treatment) HPI Comments: Chief complaint seizures possible code stroke. History provided by EMS and family.  77 year old female past medical history significant for atrial fibrillation seizures review of CVA who comes in chief complaint right-sided visual droop weakness. Patient also reportedly had 2 seizures today. [facility] and was reported to have 2 seizures. Patient does have a history of seizures. With previous seizures patient was noted to have Todd's paralysis or 2-5 days following seizure. Following reported seizure this afternoon patient had right-sided facial droop and right-sided weakness the patient was transported to the emergency department for evaluation.  Patient is a 77 y.o. female presenting with seizures. The history is provided by the EMS personnel and a relative.  Seizures Seizure activity on arrival: no   Seizure type:  Unable to specify Preceding symptoms comment:  Unable to state Initial focality:  Unable to specify Episode characteristics: partial responsiveness   Episode characteristics comment:  Not reported to EMS Postictal symptoms comment:  R sided weakness/ todd paralysis Return to baseline: yes   Severity:  Mild Timing:  Unable to specify Number of seizures this episode:  2 Progression:  Resolved Recent head injury:  No recent head injuries History of seizures: yes     Past Medical History  Diagnosis Date  . Depressive disorder, not elsewhere classified   . Atrial fibrillation   . Unspecified transient cerebral ischemia   . Unspecified cerebral artery occlusion with cerebral infarction   . Unspecified essential hypertension   . Diverticulosis of colon (without mention  of hemorrhage)   . Stricture and stenosis of esophagus   . Seizure disorder   . GERD (gastroesophageal reflux disease)   . IBS (irritable bowel syndrome)   . Stroke   . CHF (congestive heart failure)   . Anemia   . Hyperlipidemia    Past Surgical History  Procedure Laterality Date  . Hemorrhoid surgery    . Benign braintumor      Removal  . Flexible sigmoidoscopy  09/02/2011    Procedure: FLEXIBLE SIGMOIDOSCOPY;  Surgeon: Erick Blinks, MD;  Location: [facility] ENDOSCOPY;  Service: Gastroenterology;  Laterality: N/A;  . Hip arthroplasty  11/26/2011    Procedure: ARTHROPLASTY BIPOLAR HIP;  Surgeon: Harvie Junior, MD;  Location: [facility] ORS;  Service: Orthopedics;  Laterality: Right;   Family History  Problem Relation Age of Onset  . Heart disease Mother   . Colon cancer Neg Hx    History  Substance Use Topics  . Smoking status: Never Smoker   . Smokeless tobacco: Never Used  . Alcohol Use: No   OB History   Grav Para Term Preterm Abortions TAB SAB Ect Mult Living                 Review of Systems  Unable to perform ROS: Other  APHASIA from previous CVA   Allergies  Ciprofloxacin; Codeine; Levetiracetam; Penicillins; and Phenytoin sodium extended  Home Medications   Current Outpatient Rx  Name  Route  Sig  Dispense  Refill  . acetaminophen (TYLENOL) 500 MG tablet   Oral   Take 500 mg by mouth every 4 (four) hours as needed (pain).          Marland Kitchen aspirin EC 81 MG tablet  Oral   Take 81 mg by mouth daily.         Marland Kitchen atorvastatin (LIPITOR) 40 MG tablet   Oral   Take 40 mg by mouth daily at 6 PM.          . carbamazepine (TEGRETOL) 200 MG tablet   Oral   Take 100 mg by mouth 3 (three) times daily with meals.          . Cholecalciferol (VITAMIN D) 2000 UNITS tablet   Oral   Take 4,000 Units by mouth daily.         . citalopram (CELEXA) 20 MG tablet   Oral   Take 20 mg by mouth daily.         . cloNIDine (CATAPRES) 0.1 MG tablet   Oral   Take 0.1 mg by mouth  every 6 (six) hours as needed (Systolic BP over 161).         . conjugated estrogens (PREMARIN) vaginal cream   Vaginal   Place vaginally at bedtime. Apply  small amount on urethra daily         . diltiazem (CARDIZEM CD) 120 MG 24 hr capsule   Oral   Take 120 mg by mouth daily. Take on an empty stomach         . furosemide (LASIX) 20 MG tablet   Oral   Take 1 tablet (20 mg total) by mouth daily.         Marland Kitchen lisinopril (PRINIVIL,ZESTRIL) 40 MG tablet   Oral   Take 40 mg by mouth daily.          Marland Kitchen LORazepam (ATIVAN) 0.5 MG tablet   Oral   Take 0.5 mg by mouth at bedtime.         . metoprolol (LOPRESSOR) 100 MG tablet   Oral   Take 1 tablet (100 mg total) by mouth 2 (two) times daily.   1 tablet   1   . nitrofurantoin, macrocrystal-monohydrate, (MACROBID) 100 MG capsule   Oral   Take 100 mg by mouth every evening. For prophylaxis         . nystatin (MYCOSTATIN/NYSTOP) 100000 UNIT/GM POWD   Topical   Apply 1 g topically 3 (three) times daily as needed (itching). Apply to perineum sparingly         . saccharomyces boulardii (FLORASTOR) 250 MG capsule   Oral   Take 250 mg by mouth 2 (two) times daily.          . Skin Protectants, Misc. (A+D FIRST AID) OINT   Topical   Apply 1 application topically 2 (two) times daily.         Marland Kitchen warfarin (COUMADIN) 2 MG tablet   Oral   Take 2 mg by mouth once a week. Take on Friday evenings (take 4 mg tablet on all other days)         . warfarin (COUMADIN) 4 MG tablet   Oral   Take 4 mg by mouth See admin instructions. Take 1 tablet (4 mg) in the evening Saturday thru Thursday (take 2 mg tablet on Friday evening)         . EXPIRED: diltiazem (CARDIZEM CD) 120 MG 24 hr capsule   Oral   Take 1 capsule (120 mg total) by mouth daily.          BP 182/77  Pulse 78  Resp 16  SpO2 98% Physical Exam  Nursing note and vitals reviewed. Constitutional:  NAD  HENT:  Head: Normocephalic and atraumatic.  Eyes:  EOM are normal. Pupils are equal, round, and reactive to light.  Neck: Normal range of motion.  Cardiovascular: Intact distal pulses.   Normal rate. Afib   Pulmonary/Chest: Effort normal and breath sounds normal. No respiratory distress.  Abdominal: Soft. She exhibits no distension. There is no tenderness.  Musculoskeletal: Normal range of motion.  Neurological: No cranial nerve deficit or sensory deficit.  At baseline pt with aphasia, possible some alzheimers per family at bedside. At baseline per family.  Some weakness R side, residual previous CVA. None new per family.      ED Course  Procedures (including critical care time) Labs Review Labs Reviewed  PROTIME-INR - Abnormal; Notable for the following:    Prothrombin Time 21.3 (*)    INR 1.91 (*)    All other components within normal limits  CBC - Abnormal; Notable for the following:    MCH 34.5 (*)    All other components within normal limits  COMPREHENSIVE METABOLIC PANEL - Abnormal; Notable for the following:    Potassium 3.0 (*)    Glucose, Bld 109 (*)    Creatinine, Ser 1.15 (*)    GFR calc non Af Amer 45 (*)    GFR calc Af Amer 53 (*)    All other components within normal limits  GLUCOSE, CAPILLARY - Abnormal; Notable for the following:    Glucose-Capillary 121 (*)    All other components within normal limits  PHENYTOIN LEVEL, TOTAL - Abnormal; Notable for the following:    Phenytoin Lvl <2.5 (*)    All other components within normal limits  POCT I-STAT, CHEM 8 - Abnormal; Notable for the following:    Potassium 3.0 (*)    Creatinine, Ser 1.50 (*)    Glucose, Bld 110 (*)    All other components within normal limits  ETHANOL  APTT  DIFFERENTIAL  TROPONIN I  CARBAMAZEPINE LEVEL, TOTAL  URINE RAPID DRUG SCREEN (HOSP PERFORMED)  URINALYSIS, ROUTINE W REFLEX MICROSCOPIC  HEMOGLOBIN A1C  LIPID PANEL  POCT I-STAT TROPONIN I   Imaging Review Ct Head Wo Contrast  08/13/2013   CLINICAL DATA:  Code stroke,  right-sided weakness, trouble speaking  EXAM: CT HEAD WITHOUT CONTRAST  TECHNIQUE: Contiguous axial images were obtained from the base of the skull through the vertex without intravenous contrast.  COMPARISON:  03/30/2013  FINDINGS: Prior left frontal parietal craniotomy.  Large area of encephalomalacia at the left frontal lobe again identified with significant dilatation of the frontal horn of the left lateral ventricle, similar to previous exam, secondary to prior surgery.  No midline shift or mass effect.  Remaining ventricular system unremarkable.  Small vessel chronic ischemic changes of deep cerebral white matter.  Old right thalamic lacunar infarct.  No intracranial hemorrhage, mass lesion, or evidence of acute infarction.  Remaining bones and sinuses unremarkable.  IMPRESSION: Generalized atrophy with small vessel chronic ischemic changes of deep cerebral white matter.  Significant encephalomalacia of the left frontal lobe and marked dilatation of the anterior horn of left lateral ventricle likely related to prior craniotomy, unchanged.  Old lacunar infarct right thalamus.  No acute intracranial abnormalities.  Findings called to Dr. Leroy Kennedy on 08/13/2013 at 1806 hr.   Electronically Signed   By: Ulyses Southward M.D.   On: 08/13/2013 18:07   Mr Maxine Glenn Head Wo Contrast  08/13/2013   CLINICAL DATA:  History of hypertension, hyperlipidemia and atrial fibrillation. Acute aphasia. Right hemi paresis and right  facial weakness. History of prior surgery for benign process.  EXAM: MRI HEAD WITHOUT CONTRAST  MRA HEAD WITHOUT CONTRAST  TECHNIQUE: Multiplanar, multiecho pulse sequences of the brain and surrounding structures were obtained without intravenous contrast. Angiographic images of the head were obtained using MRA technique without contrast.  COMPARISON:  08/13/2013 CT.  08/28/2011 and 05/11/2007 MR.  FINDINGS: MRI HEAD FINDINGS  Exam is motion degraded.  No acute infarct.  No intracranial hemorrhage.  Prior left  craniotomy for resection of benign tumor as per history provided. The large cystic cavity at the resection site is similar to the prior exam.  Significant white matter type changes have progressed slightly since prior exam.  Prominent atrophy.  Ventricular size similar to prior exam.  Cervical spondylotic changes with spinal stenosis and mild cord flattening C2-3 through C4-5. Cervical medullary junction, pituitary region, pineal region and orbital structures unremarkable.  Abnormal appearance left internal carotid artery.  Please see below.  MRA HEAD FINDINGS  Left internal carotid artery conclusion with reconstitution of flow left carotid terminus region appears similar to prior exam.  Decrease number of visualized left middle cerebral artery branches unchanged.  Loss of signal involving a majority of the intracranial aspect of the right vertebral artery may reflect high-grade stenosis or occlusion and is new from the 2008 exam.  Non visualization right posterior inferior cerebellar artery.  No significant stenosis of the left vertebral artery or basilar artery.  Only small portion of the left posterior inferior cerebellar arteries visualize.  Non visualization anterior inferior cerebellar arteries.  Moderate narrowing distal posterior cerebral arteries bilaterally with mild to moderate narrowing mid aspect of the posterior cerebral arteries bilaterally.  No aneurysm noted.  IMPRESSION: Exam is motion degraded.  No acute infarct.  Prior left craniotomy for resection of benign tumor as per history provided. The large cystic cavity at the resection site is similar to the prior exam.  Significant white matter type changes have progressed slightly since prior exam.  Prominent atrophy.  Ventricular size similar to prior exam.  Cervical spondylotic changes with spinal stenosis and mild cord flattening C2-3 through C4-5. Cervical medullary junction, pituitary region, pineal region and orbital structures unremarkable.   Left internal carotid artery conclusion with reconstitution of flow left carotid terminus region appears similar to prior exam.  Decrease number of visualized left middle cerebral artery branches unchanged.  Loss of signal involving a majority of the intracranial aspect of the right vertebral artery may reflect high-grade stenosis or occlusion and is new from the 2008 exam.  Please see above for further detail.   Electronically Signed   By: Bridgett Larsson M.D.   On: 08/13/2013 20:19   Mr Brain Wo Contrast  08/13/2013   CLINICAL DATA:  History of hypertension, hyperlipidemia and atrial fibrillation. Acute aphasia. Right hemi paresis and right facial weakness. History of prior surgery for benign process.  EXAM: MRI HEAD WITHOUT CONTRAST  MRA HEAD WITHOUT CONTRAST  TECHNIQUE: Multiplanar, multiecho pulse sequences of the brain and surrounding structures were obtained without intravenous contrast. Angiographic images of the head were obtained using MRA technique without contrast.  COMPARISON:  08/13/2013 CT.  08/28/2011 and 05/11/2007 MR.  FINDINGS: MRI HEAD FINDINGS  Exam is motion degraded.  No acute infarct.  No intracranial hemorrhage.  Prior left craniotomy for resection of benign tumor as per history provided. The large cystic cavity at the resection site is similar to the prior exam.  Significant white matter type changes have progressed slightly since prior exam.  Prominent atrophy.  Ventricular size similar to prior exam.  Cervical spondylotic changes with spinal stenosis and mild cord flattening C2-3 through C4-5. Cervical medullary junction, pituitary region, pineal region and orbital structures unremarkable.  Abnormal appearance left internal carotid artery.  Please see below.  MRA HEAD FINDINGS  Left internal carotid artery conclusion with reconstitution of flow left carotid terminus region appears similar to prior exam.  Decrease number of visualized left middle cerebral artery branches unchanged.  Loss  of signal involving a majority of the intracranial aspect of the right vertebral artery may reflect high-grade stenosis or occlusion and is new from the 2008 exam.  Non visualization right posterior inferior cerebellar artery.  No significant stenosis of the left vertebral artery or basilar artery.  Only small portion of the left posterior inferior cerebellar arteries visualize.  Non visualization anterior inferior cerebellar arteries.  Moderate narrowing distal posterior cerebral arteries bilaterally with mild to moderate narrowing mid aspect of the posterior cerebral arteries bilaterally.  No aneurysm noted.  IMPRESSION: Exam is motion degraded.  No acute infarct.  Prior left craniotomy for resection of benign tumor as per history provided. The large cystic cavity at the resection site is similar to the prior exam.  Significant white matter type changes have progressed slightly since prior exam.  Prominent atrophy.  Ventricular size similar to prior exam.  Cervical spondylotic changes with spinal stenosis and mild cord flattening C2-3 through C4-5. Cervical medullary junction, pituitary region, pineal region and orbital structures unremarkable.  Left internal carotid artery conclusion with reconstitution of flow left carotid terminus region appears similar to prior exam.  Decrease number of visualized left middle cerebral artery branches unchanged.  Loss of signal involving a majority of the intracranial aspect of the right vertebral artery may reflect high-grade stenosis or occlusion and is new from the 2008 exam.  Please see above for further detail.   Electronically Signed   By: Bridgett Larsson M.D.   On: 08/13/2013 20:19    EKG Interpretation    Date/Time:  Thursday August 13 2013 18:26:33 EST Ventricular Rate:  87 PR Interval:    QRS Duration: 95 QT Interval:  411 QTC Calculation: 494 R Axis:   99 Text Interpretation:  Atrial fibrillation Right axis deviation Probable anteroseptal infarct, old  Minimal ST depression, diffuse leads No significant change since last tracing Confirmed by ALLEN  MD, ANTHONY ([id number]) on 08/13/2013 9:10:41 PM            MDM   1. Seizure     AFVSS, NAD. The patient with right-sided weakness. Secondary this to a stroke workup was initiated and neurology was at bedside on arrival. Patient noted to have seizures earlier today. The patient does have a history of seizures with a history of Todd's paralysis on the right side following seizures. Also pt with residual right-sided weakness from previous CVA.  CT showed no evidence of acute CVA. Remainder of patient's labs within normal limits for this patient. After initial CT was completed and neurology is ordered an MRI to further evaluate possible ischemic damage. Showed no new areas of ischemia. Was noted to have high-grade stenosis of vertebral artery which is not previously present. No new ischemia, do not believe this vertebral artery stenosis causing issue. Will followup with PCP and neurology regarding this. On reexamination patient at baseline exam her family at bedside. Patient with only slight weakness on right side which is normal status Hutch Rhett CVA previously. The patient likely seizure similar to previous. Tegretol  level therapeutic. No additional Tegretol or antiepileptic required at this time. No further seizures emergency department. No other significant lab abnormalities noted. Initial plan for patient be admitted to the medicine service for continued evaluation regarding high-grade stenosis vertebral artery as well as seizures. However son at bedside refused admission. He rather follow up with PCP. Given patient is stable at baseline feel this is appropriate with close follow up with PCP. Remain stable emergency department time of discharge.   Patient discussed with attending Dr. Freida Busman.    Bridgett Larsson, MD 08/14/13 [phone]

## 2013-08-13 NOTE — ED Notes (Signed)
Pt from [facility] via [facility].  Pt had 2 seizures earlier today as reported by staff to EMS.  She was last seen well at 1645.  Reported right sided weakness, facial droop, and changed in verbal response from her baseline.  Pt in NAD.

## 2013-08-13 NOTE — Consult Note (Addendum)
Referring Physician: ED    Chief Complaint: CODE STROKE: RIGHT HEMIPARESIS, RIGHT FACE WEAKNESS, APHASIA  HPI:                                                                                                                                         [name] is an 77 y.o. female with a past medical history significant for HTN, hyperlipidemia, atrial fibrillation on coumadin, left frontal lobe infarct, status post left brain tumor surgery several years ago, symptomatic GTC seziures, brought to [facility] ED by ambulance due to acute onset of the above stated symptoms. She lives at an assisted living facility and was last known well at 1645 today, when she was reported to have difficulty talking, right sided weakness, and droopiness of the right face. EMS reported SBP 210 upon arrival to the scene. She apparently is quite functional. NIHSS 11. CT brain revealed no acute abnormality. Importantly, she had 2 GTC seizures earlier today. INR 1.91.    Date last known well:  Time last known well:  tPA Given: no, patient with prior brain surgery, also on coumadin with INR 1.91. NIHSS: 11 MRS:   Past Medical History  Diagnosis Date  . Depressive disorder, not elsewhere classified   . Atrial fibrillation   . Unspecified transient cerebral ischemia   . Unspecified cerebral artery occlusion with cerebral infarction   . Unspecified essential hypertension   . Diverticulosis of colon (without mention of hemorrhage)   . Stricture and stenosis of esophagus   . Seizure disorder   . GERD (gastroesophageal reflux disease)   . IBS (irritable bowel syndrome)   . Stroke   . CHF (congestive heart failure)   . Anemia   . Hyperlipidemia     Past Surgical History  Procedure Laterality Date  . Hemorrhoid surgery    . Benign braintumor      Removal  . Flexible sigmoidoscopy  09/02/2011    Procedure: FLEXIBLE SIGMOIDOSCOPY;  Surgeon: Erick Blinks, MD;  Location: [facility] ENDOSCOPY;  Service: Gastroenterology;   Laterality: N/A;  . Hip arthroplasty  11/26/2011    Procedure: ARTHROPLASTY BIPOLAR HIP;  Surgeon: Harvie Junior, MD;  Location: [facility] ORS;  Service: Orthopedics;  Laterality: Right;    Family History  Problem Relation Age of Onset  . Heart disease Mother   . Colon cancer Neg Hx    Social History:  reports that she has never smoked. She has never used smokeless tobacco. She reports that she does not drink alcohol or use illicit drugs.  Allergies:  Allergies  Allergen Reactions  . Ciprofloxacin     REACTION: diarrhea/yeast infection  . Codeine Other (See Comments)    unknown  . Levetiracetam Nausea Only    Nausea developed while on this med, worsened when dose increased.  Nausea ceased when med was d/cd.  Marland Kitchen Penicillins Hives  . Phenytoin Sodium Extended Other (See Comments)  unknown    Medications:                                                                                                                           I have reviewed the patient's current medications.  ROS:  Unable to obtain due to patient's aphasia.                                                                                                                                     History obtained from chart review   Physical exam: pleasant female in no apparent distress. BP 180/90. P 82 R 17 Afebrile. SpO2 97.00%. Head: normocephalic. Neck: supple, no bruits, no JVD. Cardiac: no murmurs. Lungs: clear. Abdomen: soft, no tender, no mass. Extremities: no edema.   Neurologic Examination:                                                                                                      Mental Status: Alert, awake. oriented, thought content appropriate.  Follows simple commands inconsistently but has frank global aphasia, expressive more than receptive. Cranial Nerves: II: Discs flat bilaterally; Visual fields grossly normal, pupils equal, round, reactive to light and accommodation III,IV, VI: ptosis not  present, extra-ocular motions intact bilaterally V,VII: smile asymmetric with right face weakness, facial light touch sensation normal bilaterally VIII: hearing normal bilaterally IX,X: gag reflex present XI: bilateral shoulder shrug XII: midline tongue extension without atrophy or fasciculations  Motor: Significant for right HP Sensory: Pinprick and light touch intact throughout, bilaterally Deep Tendon Reflexes:  1+ all over. Plantars: Right: downgoing   Left: downgoing Cerebellar: normal finger-to-nose,  normal heel-to-shin test Gait:  No tested. CV: pulses palpable throughout    Results for orders placed during the hospital encounter of 08/13/13 (from the past 48 hour(s))  CBC     Status: Abnormal   Collection Time    08/13/13  5:39 PM      Result Value Range  WBC 8.7  4.0 - 10.5 K/uL   RBC 3.88  3.87 - 5.11 MIL/uL   Hemoglobin 13.4  12.0 - 15.0 g/dL   HCT 16.1  09.6 - 04.5 %   MCV 99.7  78.0 - 100.0 fL   MCH 34.5 (*) 26.0 - 34.0 pg   MCHC 34.6  30.0 - 36.0 g/dL   RDW 40.9  81.1 - 91.4 %   Platelets 254  150 - 400 K/uL  DIFFERENTIAL     Status: None   Collection Time    08/13/13  5:39 PM      Result Value Range   Neutrophils Relative % 67  43 - 77 %   Neutro Abs 5.9  1.7 - 7.7 K/uL   Lymphocytes Relative 20  12 - 46 %   Lymphs Abs 1.7  0.7 - 4.0 K/uL   Monocytes Relative 11  3 - 12 %   Monocytes Absolute 1.0  0.1 - 1.0 K/uL   Eosinophils Relative 1  0 - 5 %   Eosinophils Absolute 0.1  0.0 - 0.7 K/uL   Basophils Relative 1  0 - 1 %   Basophils Absolute 0.0  0.0 - 0.1 K/uL   No results found.   [facility] [phone]  08/13/2013, 6:07 PM   Assessment: 77 y.o. female with several risk factors for stroke, brought in with right hemiparesis, right face weakness, and inability to speak. NIHSS 11. CT brain revealed no acute abnormality. She is on coumadin with INR 1.91 and had prior brain surgery and thus no a candidate for IV tpa. Had 2 GTC  seizures today and her son indicated that she typically develops Todd's paralysis that can last for 2 or 3 days. In addition, she has residual aphasia and " some right sided weakness". Admit to medicine. Complete stroke work up. Check dilantin level. Will follow up.  Stroke Risk Factors - age, HTN, hyperlipidemia, atrial fibrillation, stroke.  Plan: 1. HgbA1c, fasting lipid panel 2. MRI, MRA  of the brain without contrast 3. Echocardiogram 4. Carotid dopplers 5. Prophylactic therapy-Anticoagulation: Coumadin 6. Risk factor modification 7. Telemetry monitoring 8. Frequent neuro checks 9. PT/OT SLP   Wyatt Portela, MD [facility] [phone]  08/13/2013, 6:07 PM

## 2013-08-13 NOTE — ED Notes (Signed)
Dr. Freida Busman in to speak with pt's son ref. Plan of care.

## 2013-08-13 NOTE — ED Provider Notes (Signed)
.   I saw and evaluated the patient, reviewed the resident's note and I agree with the findings and plan.  EKG Interpretation    Date/Time:  Thursday August 13 2013 18:26:33 EST Ventricular Rate:  87 PR Interval:    QRS Duration: 95 QT Interval:  411 QTC Calculation: 494 R Axis:   99 Text Interpretation:  Atrial fibrillation Right axis deviation Probable anteroseptal infarct, old Minimal ST depression, diffuse leads No significant change since last tracing Confirmed by Samuel Mcpeek  MD, Twanisha Foulk ([id number]) on 08/13/2013 9:10:41 PM           Patient seen and examined. Labs and x-rays reviewed. Spoke with neurology about patient's condition and believes that her symptoms were due to todds paralysis from her 2 seizures today. Code stroke was canceled by neurology. Spoke with son at length about her condition and he was very upset. Explained to him that the plan will be to adjust her Tegretol if needed and to IV hydrate her and discharge her home as per his wishes. According to the son, the patient is at her neurologic baseline  Toy Baker, MD 08/13/13 2156

## 2013-08-14 LAB — HEMOGLOBIN A1C
Hgb A1c MFr Bld: 5.5 % (ref <5.7)
Mean Plasma Glucose: 111 mg/dL (ref <117)

## 2013-08-14 NOTE — ED Provider Notes (Signed)
I saw and evaluated the patient, reviewed the resident's note and I agree with the findings and plan.  Toy Baker, MD 08/14/13 [phone]

## 2013-09-21 ENCOUNTER — Emergency Department (HOSPITAL_COMMUNITY)
Admission: EM | Admit: 2013-09-21 | Discharge: 2013-09-21 | Disposition: A | Discharge status: Home / Self Care | Payer: Medicare Other | Attending: Emergency Medicine | Admitting: Emergency Medicine

## 2013-09-21 ENCOUNTER — Encounter (HOSPITAL_COMMUNITY): Payer: Self-pay | Admitting: Emergency Medicine

## 2013-09-21 ENCOUNTER — Emergency Department (HOSPITAL_COMMUNITY): Payer: Medicare Other

## 2013-09-21 DIAGNOSIS — Z880 Allergy status to penicillin: Secondary | ICD-10-CM | POA: Insufficient documentation

## 2013-09-21 DIAGNOSIS — R52 Pain, unspecified: Secondary | ICD-10-CM | POA: Insufficient documentation

## 2013-09-21 DIAGNOSIS — R509 Fever, unspecified: Secondary | ICD-10-CM

## 2013-09-21 DIAGNOSIS — F329 Major depressive disorder, single episode, unspecified: Secondary | ICD-10-CM | POA: Insufficient documentation

## 2013-09-21 DIAGNOSIS — Z862 Personal history of diseases of the blood and blood-forming organs and certain disorders involving the immune mechanism: Secondary | ICD-10-CM | POA: Insufficient documentation

## 2013-09-21 DIAGNOSIS — E876 Hypokalemia: Secondary | ICD-10-CM

## 2013-09-21 DIAGNOSIS — Z79899 Other long term (current) drug therapy: Secondary | ICD-10-CM | POA: Insufficient documentation

## 2013-09-21 DIAGNOSIS — R5381 Other malaise: Secondary | ICD-10-CM | POA: Insufficient documentation

## 2013-09-21 DIAGNOSIS — I4891 Unspecified atrial fibrillation: Secondary | ICD-10-CM | POA: Insufficient documentation

## 2013-09-21 DIAGNOSIS — Z7901 Long term (current) use of anticoagulants: Secondary | ICD-10-CM | POA: Insufficient documentation

## 2013-09-21 DIAGNOSIS — G40909 Epilepsy, unspecified, not intractable, without status epilepticus: Secondary | ICD-10-CM | POA: Insufficient documentation

## 2013-09-21 DIAGNOSIS — E785 Hyperlipidemia, unspecified: Secondary | ICD-10-CM | POA: Insufficient documentation

## 2013-09-21 DIAGNOSIS — Z8673 Personal history of transient ischemic attack (TIA), and cerebral infarction without residual deficits: Secondary | ICD-10-CM | POA: Insufficient documentation

## 2013-09-21 DIAGNOSIS — F3289 Other specified depressive episodes: Secondary | ICD-10-CM | POA: Insufficient documentation

## 2013-09-21 DIAGNOSIS — Z8719 Personal history of other diseases of the digestive system: Secondary | ICD-10-CM | POA: Insufficient documentation

## 2013-09-21 DIAGNOSIS — R29810 Facial weakness: Secondary | ICD-10-CM | POA: Insufficient documentation

## 2013-09-21 DIAGNOSIS — R059 Cough, unspecified: Secondary | ICD-10-CM | POA: Insufficient documentation

## 2013-09-21 DIAGNOSIS — I10 Essential (primary) hypertension: Secondary | ICD-10-CM | POA: Insufficient documentation

## 2013-09-21 DIAGNOSIS — I509 Heart failure, unspecified: Secondary | ICD-10-CM | POA: Insufficient documentation

## 2013-09-21 DIAGNOSIS — Z7982 Long term (current) use of aspirin: Secondary | ICD-10-CM | POA: Insufficient documentation

## 2013-09-21 DIAGNOSIS — R05 Cough: Secondary | ICD-10-CM | POA: Insufficient documentation

## 2013-09-21 LAB — URINE MICROSCOPIC-ADD ON

## 2013-09-21 LAB — URINALYSIS, ROUTINE W REFLEX MICROSCOPIC
Bilirubin Urine: NEGATIVE
Ketones, ur: NEGATIVE mg/dL
Leukocytes, UA: NEGATIVE
Nitrite: NEGATIVE
Protein, ur: NEGATIVE mg/dL
Urobilinogen, UA: 0.2 mg/dL (ref 0.0–1.0)
pH: 5 (ref 5.0–8.0)

## 2013-09-21 LAB — CBC WITH DIFFERENTIAL/PLATELET
Basophils Absolute: 0 10*3/uL (ref 0.0–0.1)
Basophils Relative: 0 % (ref 0–1)
Eosinophils Absolute: 0 10*3/uL (ref 0.0–0.7)
MCH: 34.1 pg — ABNORMAL HIGH (ref 26.0–34.0)
MCHC: 34.6 g/dL (ref 30.0–36.0)
Monocytes Absolute: 1 10*3/uL (ref 0.1–1.0)
Monocytes Relative: 10 % (ref 3–12)
Neutro Abs: 8.4 10*3/uL — ABNORMAL HIGH (ref 1.7–7.7)
Neutrophils Relative %: 84 % — ABNORMAL HIGH (ref 43–77)
Platelets: 213 10*3/uL (ref 150–400)
RDW: 13.1 % (ref 11.5–15.5)
WBC: 10.1 10*3/uL (ref 4.0–10.5)

## 2013-09-21 LAB — PROTIME-INR
INR: 2.09 — ABNORMAL HIGH (ref 0.00–1.49)
Prothrombin Time: 22.8 seconds — ABNORMAL HIGH (ref 11.6–15.2)

## 2013-09-21 LAB — BASIC METABOLIC PANEL
BUN: 19 mg/dL (ref 6–23)
Calcium: 9 mg/dL (ref 8.4–10.5)
Chloride: 102 mEq/L (ref 96–112)
Creatinine, Ser: 1.08 mg/dL (ref 0.50–1.10)
GFR calc Af Amer: 57 mL/min — ABNORMAL LOW (ref >90)
GFR calc non Af Amer: 49 mL/min — ABNORMAL LOW (ref >90)
Potassium: 2.5 mEq/L — CL (ref 3.5–5.1)

## 2013-09-21 LAB — CG4 I-STAT (LACTIC ACID): Lactic Acid, Venous: 1.42 mmol/L (ref 0.5–2.2)

## 2013-09-21 MED ORDER — POTASSIUM CHLORIDE CRYS ER 20 MEQ PO TBCR
40.0000 meq | EXTENDED_RELEASE_TABLET | Freq: Once | ORAL | Status: AC
  Administered 2013-09-21: 40 meq via ORAL
  Filled 2013-09-21: qty 2

## 2013-09-21 MED ORDER — POTASSIUM CHLORIDE CRYS ER 20 MEQ PO TBCR: 20.0000 meq | EXTENDED_RELEASE_TABLET | Freq: Two times a day (BID) | ORAL | Status: DC

## 2013-09-21 NOTE — ED Notes (Signed)
Pt is able to answer a few questions, follows commands. Per ems, this is pts baseline.

## 2013-09-21 NOTE — ED Notes (Signed)
Lab crit value - 2.5 potassium.

## 2013-09-21 NOTE — ED Notes (Signed)
Pt undressed, in gown, on monitor, continuous pulse oximetry and blood pressure cuff 

## 2013-09-21 NOTE — ED Notes (Signed)
Dr. Rubin Payor notified of critical potassium

## 2013-09-21 NOTE — ED Notes (Signed)
Per ems, for one day pt states shes hurting, unable to tell us where. pt has trouble speaking due to previous stroke. Body aches, cough, and fever of 100.4. Pt given tylenol by ems.

## 2013-09-21 NOTE — ED Notes (Signed)
Spoke with husband on the phone, pt had stroke 2005, hx of aphasia and dementia. Pt currently taking medication for seizures. Moves by wheelchair. [facility] was at [facility] weeks ago due to seizures due to dehydration. Husband concerned for high blood pressure. Pt has had the flu shot. Fatemah Pourciau (husband) Medical power of attorney - [phone]. Pt is incontinent.

## 2013-09-21 NOTE — ED Provider Notes (Signed)
CSN: [id number]     Arrival date & time 09/21/13  1520 History   First MD Initiated Contact with Patient 09/21/13 1635     Chief Complaint  Patient presents with  . Fever  . Cough  . Generalized Body Aches   level V caveat due to aphasia from previous stroke (Consider location/radiation/quality/duration/timing/severity/associated sxs/prior Treatment) Patient is a 77 y.o. female presenting with fever and cough. The history is provided by the patient.  Fever Associated symptoms: cough   Cough Associated symptoms: fever    patient with reported fever. Has also reportedly had cough. Temperature is reportedly up to 100.4.  Past Medical History  Diagnosis Date  . Depressive disorder, not elsewhere classified   . Atrial fibrillation   . Unspecified transient cerebral ischemia   . Unspecified cerebral artery occlusion with cerebral infarction   . Unspecified essential hypertension   . Diverticulosis of colon (without mention of hemorrhage)   . Stricture and stenosis of esophagus   . Seizure disorder   . GERD (gastroesophageal reflux disease)   . IBS (irritable bowel syndrome)   . Stroke   . CHF (congestive heart failure)   . Anemia   . Hyperlipidemia    Past Surgical History  Procedure Laterality Date  . Hemorrhoid surgery    . Benign braintumor      Removal  . Flexible sigmoidoscopy  09/02/2011    Procedure: FLEXIBLE SIGMOIDOSCOPY;  Surgeon: Erick Blinks, MD;  Location[facility] ENDOSCOPY;  Service: Gastroenterology;  Laterality: N/A;  . Hip arthroplasty  11/26/2011    Procedure: ARTHROPLASTY BIPOLAR HIP;  Surgeon: Harvie Junior, MD;  Location: [facility] ORS;  Service: Orthopedics;  Laterality: Right;   Family History  Problem Relation Age of Onset  . Heart disease Mother   . Colon cancer Neg Hx    History  Substance Use Topics  . Smoking status: Never Smoker   . Smokeless tobacco: Never Used  . Alcohol Use: No   OB History   Grav Para Term Preterm Abortions TAB SAB Ect Mult Living                 Review of Systems  Unable to perform ROS Constitutional: Positive for fever.  Respiratory: Positive for cough.     Allergies  Ciprofloxacin; Codeine; Levetiracetam; Penicillins; and Phenytoin sodium extended  Home Medications   Current Outpatient Rx  Name  Route  Sig  Dispense  Refill  . acetaminophen (TYLENOL) 500 MG tablet   Oral   Take 500 mg by mouth every 4 (four) hours as needed (pain).          Marland Kitchen aspirin EC 81 MG tablet   Oral   Take 81 mg by mouth daily.         Marland Kitchen atorvastatin (LIPITOR) 40 MG tablet   Oral   Take 40 mg by mouth daily at 6 PM.          . carbamazepine (TEGRETOL) 200 MG tablet   Oral   Take 100 mg by mouth 3 (three) times daily with meals.          . Cholecalciferol (VITAMIN D) 2000 UNITS tablet   Oral   Take 4,000 Units by mouth daily.         . citalopram (CELEXA) 20 MG tablet   Oral   Take 20 mg by mouth daily.         . cloNIDine (CATAPRES) 0.1 MG tablet   Oral   Take 0.1 mg by  mouth every 6 (six) hours as needed (Systolic BP over 409).         . conjugated estrogens (PREMARIN) vaginal cream   Vaginal   Place vaginally at bedtime. Apply  small amount on urethra daily         . diltiazem (CARDIZEM CD) 120 MG 24 hr capsule   Oral   Take 120 mg by mouth daily. Take on an empty stomach         . furosemide (LASIX) 20 MG tablet   Oral   Take 1 tablet (20 mg total) by mouth daily.         Marland Kitchen lisinopril (PRINIVIL,ZESTRIL) 40 MG tablet   Oral   Take 40 mg by mouth daily.          Marland Kitchen LORazepam (ATIVAN) 0.5 MG tablet   Oral   Take 0.5 mg by mouth at bedtime.         . metoprolol (LOPRESSOR) 100 MG tablet   Oral   Take 1 tablet (100 mg total) by mouth 2 (two) times daily.   1 tablet   1   . nitrofurantoin, macrocrystal-monohydrate, (MACROBID) 100 MG capsule   Oral   Take 100 mg by mouth every evening. For prophylaxis         . nystatin (MYCOSTATIN/NYSTOP) 100000 UNIT/GM POWD   Topical    Apply 1 g topically 3 (three) times daily as needed (itching). Apply to perineum sparingly         . saccharomyces boulardii (FLORASTOR) 250 MG capsule   Oral   Take 250 mg by mouth 2 (two) times daily.          . Skin Protectants, Misc. (A+D FIRST AID) OINT   Topical   Apply 1 application topically 2 (two) times daily.         Marland Kitchen warfarin (COUMADIN) 2 MG tablet   Oral   Take 2 mg by mouth once a week. Take on Friday evenings (take 4 mg tablet on all other days)         . warfarin (COUMADIN) 4 MG tablet   Oral   Take 4 mg by mouth See admin instructions. Take 1 tablet (4 mg) in the evening Saturday thru Thursday (take 2 mg tablet on Friday evening)         . potassium chloride SA (K-DUR,KLOR-CON) 20 MEQ tablet   Oral   Take 1 tablet (20 mEq total) by mouth 2 (two) times daily.   10 tablet   0    BP 163/65  Pulse 90  Temp(Src) 100.6 F (38.1 C) (Rectal)  Resp 21  SpO2 97% Physical Exam  Constitutional: She appears well-developed and well-nourished.  HENT:  Head: Normocephalic.  Eyes: Pupils are equal, round, and reactive to light.  Neck: Neck supple.  Cardiovascular:  Irregular rhythm.  Pulmonary/Chest:  Mild rales at bilateral bases  Abdominal: Soft. There is no tenderness.  Musculoskeletal: She exhibits no tenderness.  Neurological: She is alert.  Mild right-sided facial droop and right-sided weakness, reportedly chronic for patient    ED Course  Procedures (including critical care time) Labs Review Labs Reviewed  CBC WITH DIFFERENTIAL - Abnormal; Notable for the following:    RBC 3.58 (*)    HCT 35.3 (*)    MCH 34.1 (*)    Neutrophils Relative % 84 (*)    Neutro Abs 8.4 (*)    Lymphocytes Relative 6 (*)    Lymphs Abs 0.6 (*)  All other components within normal limits  BASIC METABOLIC PANEL - Abnormal; Notable for the following:    Potassium 2.5 (*)    Glucose, Bld 122 (*)    GFR calc non Af Amer 49 (*)    GFR calc Af Amer 57 (*)    All  other components within normal limits  URINALYSIS, ROUTINE W REFLEX MICROSCOPIC - Abnormal; Notable for the following:    APPearance HAZY (*)    Hgb urine dipstick MODERATE (*)    All other components within normal limits  PROTIME-INR - Abnormal; Notable for the following:    Prothrombin Time 22.8 (*)    INR 2.09 (*)    All other components within normal limits  URINE MICROSCOPIC-ADD ON - Abnormal; Notable for the following:    Bacteria, UA FEW (*)    Casts HYALINE CASTS (*)    All other components within normal limits  URINE CULTURE  CARBAMAZEPINE LEVEL, TOTAL  CG4 I-STAT (LACTIC ACID)   Imaging Review Dg Chest Port 1 View  09/21/2013   CLINICAL DATA:  77 year old female with chest pain cough and congestion. Body 8. Initial encounter.  EXAM: PORTABLE CHEST - 1 VIEW  COMPARISON:  03/30/2013 and earlier.  FINDINGS: Portable AP semi upright view at 1555 hrs. The patient is mildly rotated to the left. Stable lung volumes. Normal cardiac size and mediastinal contours. No pneumothorax or pleural effusion. Allowing for portable technique, the lungs are clear. EKG leads and wires overlie the chest.  IMPRESSION: No acute cardiopulmonary abnormality.   Electronically Signed   By: Augusto Gamble M.D.   On: 09/21/2013 16:11    EKG Interpretation   None       MDM   1. Fever   2. Hypokalemia    Patient with fever. Maybe respiratory source since she's had a cough. X-ray does not show pneumonia. Urinalysis does not show UTI. She is well-appearing. She is not hypoxic. She does have a hypokalemia this been supplemented. She'll need followup for recheck. Discussed with family. They would rather her be discharged which I believe is safe at this time.    Juliet Rude. Rubin Payor, MD 09/21/13 2053

## 2013-09-22 LAB — URINE CULTURE

## 2014-03-14 IMAGING — CR DG CHEST 1V PORT
1 series · 1 of 1 positions shown · non-contrast
Comparison: 03/30/2013 and earlier.

CLINICAL DATA: 77-year-old female with chest pain cough and
congestion. Body 8. Initial encounter.

EXAM:
PORTABLE CHEST - 1 VIEW

[AP]
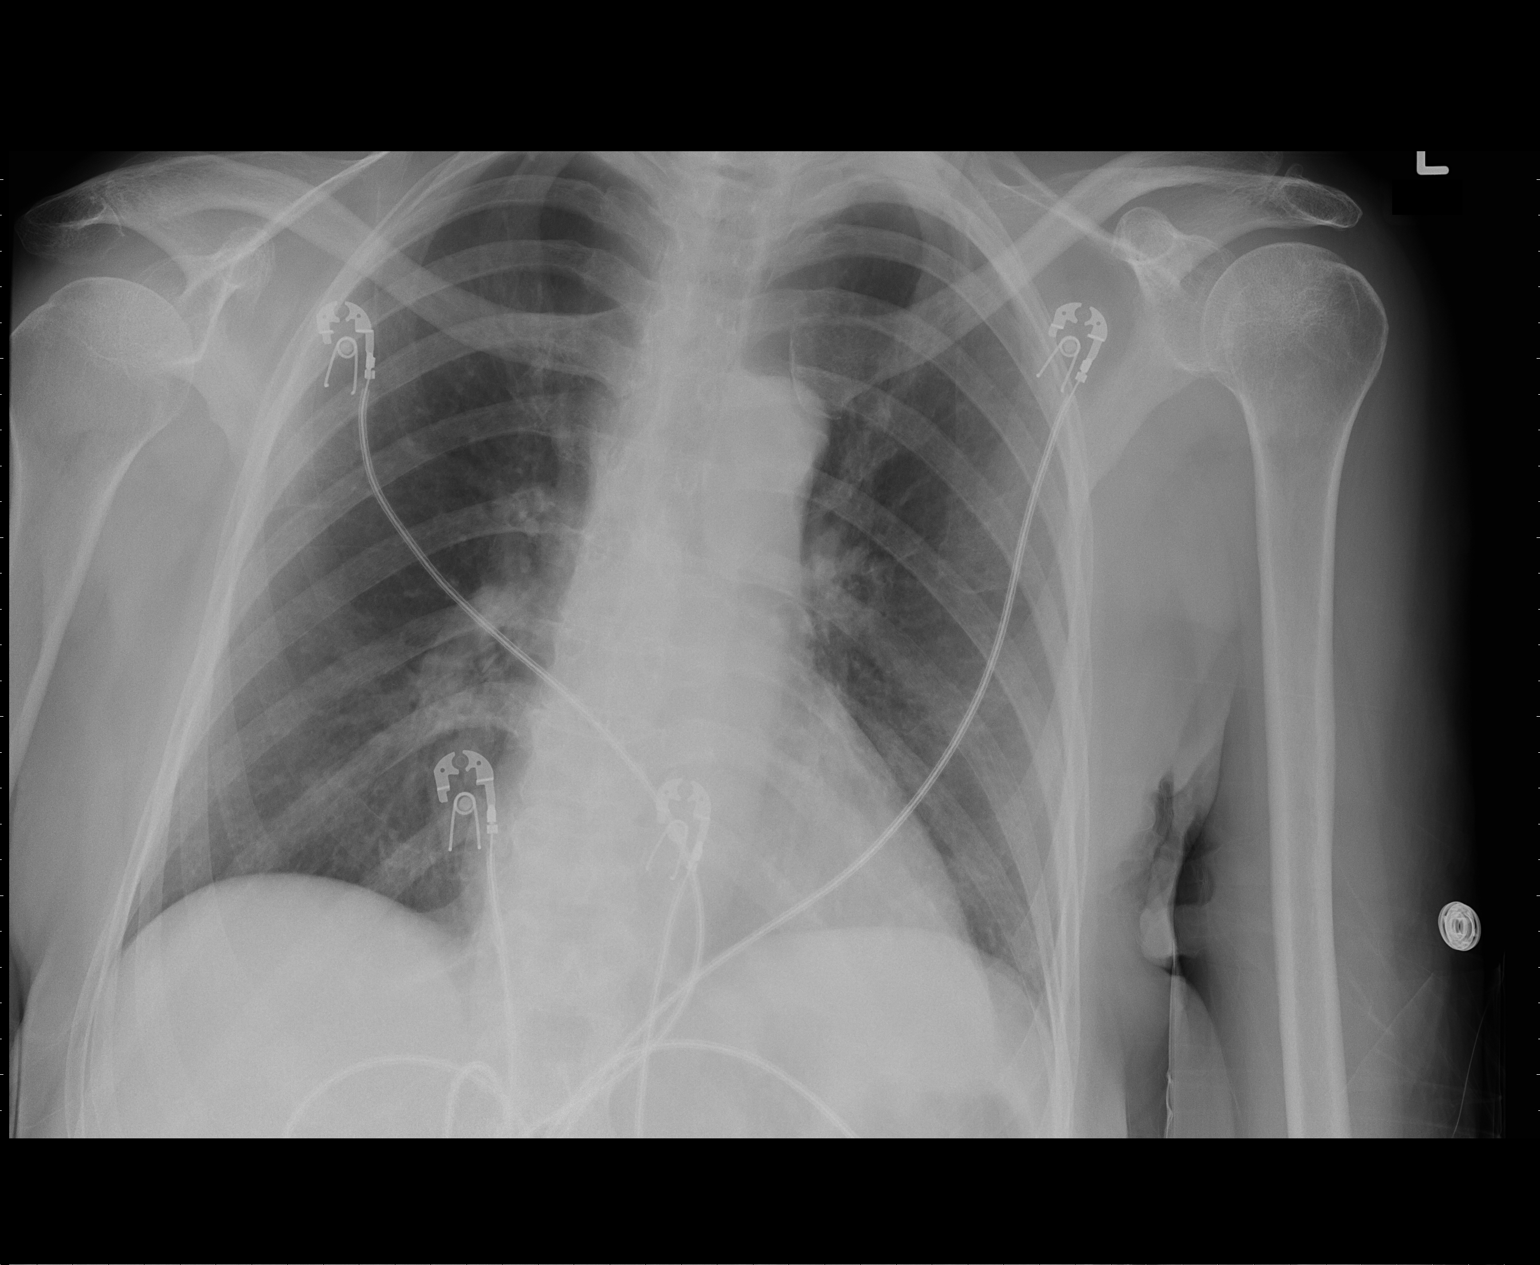

[1 of 1 positions shown; findings below may reference images not displayed]

FINDINGS: Portable AP semi upright view at 3111 hrs. The patient is mildly
rotated to the left. Stable lung volumes. Normal cardiac size and
mediastinal contours. No pneumothorax or pleural effusion. Allowing
for portable technique, the lungs are clear. EKG leads and wires
overlie the chest.
IMPRESSION: No acute cardiopulmonary abnormality.

## 2014-05-08 ENCOUNTER — Encounter: Payer: Self-pay | Admitting: *Deleted

## 2014-10-07 ENCOUNTER — Encounter (HOSPITAL_COMMUNITY): Payer: Self-pay | Admitting: Orthopedic Surgery

## 2015-02-15 ENCOUNTER — Emergency Department (HOSPITAL_COMMUNITY)
Admission: EM | Admit: 2015-02-15 | Discharge: 2015-02-15 | Disposition: A | Discharge status: Home / Self Care | Payer: Medicare Other | Source: Home / Self Care | Attending: Emergency Medicine | Admitting: Emergency Medicine

## 2015-02-15 ENCOUNTER — Emergency Department (HOSPITAL_COMMUNITY): Payer: Medicare Other

## 2015-02-15 ENCOUNTER — Encounter (HOSPITAL_COMMUNITY): Payer: Self-pay

## 2015-02-15 DIAGNOSIS — Z862 Personal history of diseases of the blood and blood-forming organs and certain disorders involving the immune mechanism: Secondary | ICD-10-CM | POA: Insufficient documentation

## 2015-02-15 DIAGNOSIS — G40909 Epilepsy, unspecified, not intractable, without status epilepticus: Secondary | ICD-10-CM | POA: Insufficient documentation

## 2015-02-15 DIAGNOSIS — Y998 Other external cause status: Secondary | ICD-10-CM | POA: Insufficient documentation

## 2015-02-15 DIAGNOSIS — S42202A Unspecified fracture of upper end of left humerus, initial encounter for closed fracture: Secondary | ICD-10-CM

## 2015-02-15 DIAGNOSIS — I69320 Aphasia following cerebral infarction: Secondary | ICD-10-CM

## 2015-02-15 DIAGNOSIS — N390 Urinary tract infection, site not specified: Secondary | ICD-10-CM | POA: Diagnosis not present

## 2015-02-15 DIAGNOSIS — I509 Heart failure, unspecified: Secondary | ICD-10-CM

## 2015-02-15 DIAGNOSIS — Z8719 Personal history of other diseases of the digestive system: Secondary | ICD-10-CM

## 2015-02-15 DIAGNOSIS — R509 Fever, unspecified: Secondary | ICD-10-CM | POA: Diagnosis not present

## 2015-02-15 DIAGNOSIS — F329 Major depressive disorder, single episode, unspecified: Secondary | ICD-10-CM | POA: Diagnosis present

## 2015-02-15 DIAGNOSIS — Z880 Allergy status to penicillin: Secondary | ICD-10-CM

## 2015-02-15 DIAGNOSIS — Z87440 Personal history of urinary (tract) infections: Secondary | ICD-10-CM

## 2015-02-15 DIAGNOSIS — E785 Hyperlipidemia, unspecified: Secondary | ICD-10-CM | POA: Diagnosis present

## 2015-02-15 DIAGNOSIS — Y9389 Activity, other specified: Secondary | ICD-10-CM

## 2015-02-15 DIAGNOSIS — Z79899 Other long term (current) drug therapy: Secondary | ICD-10-CM | POA: Insufficient documentation

## 2015-02-15 DIAGNOSIS — I481 Persistent atrial fibrillation: Secondary | ICD-10-CM

## 2015-02-15 DIAGNOSIS — I482 Chronic atrial fibrillation: Secondary | ICD-10-CM | POA: Diagnosis present

## 2015-02-15 DIAGNOSIS — Z8249 Family history of ischemic heart disease and other diseases of the circulatory system: Secondary | ICD-10-CM

## 2015-02-15 DIAGNOSIS — I10 Essential (primary) hypertension: Secondary | ICD-10-CM | POA: Insufficient documentation

## 2015-02-15 DIAGNOSIS — E860 Dehydration: Secondary | ICD-10-CM | POA: Diagnosis present

## 2015-02-15 DIAGNOSIS — Z7901 Long term (current) use of anticoagulants: Secondary | ICD-10-CM

## 2015-02-15 DIAGNOSIS — Y9289 Other specified places as the place of occurrence of the external cause: Secondary | ICD-10-CM

## 2015-02-15 DIAGNOSIS — S42292A Other displaced fracture of upper end of left humerus, initial encounter for closed fracture: Secondary | ICD-10-CM | POA: Insufficient documentation

## 2015-02-15 DIAGNOSIS — F0390 Unspecified dementia without behavioral disturbance: Secondary | ICD-10-CM | POA: Diagnosis present

## 2015-02-15 DIAGNOSIS — B9689 Other specified bacterial agents as the cause of diseases classified elsewhere: Secondary | ICD-10-CM | POA: Diagnosis present

## 2015-02-15 DIAGNOSIS — W06XXXA Fall from bed, initial encounter: Secondary | ICD-10-CM | POA: Insufficient documentation

## 2015-02-15 DIAGNOSIS — R791 Abnormal coagulation profile: Secondary | ICD-10-CM | POA: Insufficient documentation

## 2015-02-15 DIAGNOSIS — I4819 Other persistent atrial fibrillation: Secondary | ICD-10-CM

## 2015-02-15 DIAGNOSIS — E870 Hyperosmolality and hypernatremia: Secondary | ICD-10-CM | POA: Diagnosis present

## 2015-02-15 DIAGNOSIS — N179 Acute kidney failure, unspecified: Secondary | ICD-10-CM | POA: Diagnosis present

## 2015-02-15 DIAGNOSIS — Z96641 Presence of right artificial hip joint: Secondary | ICD-10-CM | POA: Diagnosis present

## 2015-02-15 DIAGNOSIS — R569 Unspecified convulsions: Secondary | ICD-10-CM | POA: Diagnosis present

## 2015-02-15 DIAGNOSIS — E876 Hypokalemia: Secondary | ICD-10-CM | POA: Diagnosis present

## 2015-02-15 DIAGNOSIS — Z8673 Personal history of transient ischemic attack (TIA), and cerebral infarction without residual deficits: Secondary | ICD-10-CM

## 2015-02-15 LAB — COMPREHENSIVE METABOLIC PANEL
ALK PHOS: 74 U/L (ref 38–126)
ALT: 12 U/L — AB (ref 14–54)
ANION GAP: 11 (ref 5–15)
AST: 19 U/L (ref 15–41)
Albumin: 3.4 g/dL — ABNORMAL LOW (ref 3.5–5.0)
BILIRUBIN TOTAL: 0.6 mg/dL (ref 0.3–1.2)
BUN: 17 mg/dL (ref 6–20)
CALCIUM: 9.5 mg/dL (ref 8.9–10.3)
CHLORIDE: 106 mmol/L (ref 101–111)
CO2: 27 mmol/L (ref 22–32)
CREATININE: 1.18 mg/dL — AB (ref 0.44–1.00)
GFR calc Af Amer: 51 mL/min — ABNORMAL LOW (ref >60)
GFR, EST NON AFRICAN AMERICAN: 44 mL/min — AB (ref >60)
Glucose, Bld: 115 mg/dL — ABNORMAL HIGH (ref 65–99)
Potassium: 3.2 mmol/L — ABNORMAL LOW (ref 3.5–5.1)
Sodium: 144 mmol/L (ref 135–145)
Total Protein: 6.8 g/dL (ref 6.5–8.1)

## 2015-02-15 LAB — CBC WITH DIFFERENTIAL/PLATELET
Basophils Absolute: 0 10*3/uL (ref 0.0–0.1)
Basophils Relative: 0 % (ref 0–1)
Eosinophils Absolute: 0 10*3/uL (ref 0.0–0.7)
Eosinophils Relative: 0 % (ref 0–5)
HCT: 37.6 % (ref 36.0–46.0)
HEMOGLOBIN: 12.5 g/dL (ref 12.0–15.0)
LYMPHS ABS: 0.8 10*3/uL (ref 0.7–4.0)
LYMPHS PCT: 8 % — AB (ref 12–46)
MCH: 32.6 pg (ref 26.0–34.0)
MCHC: 33.2 g/dL (ref 30.0–36.0)
MCV: 98.2 fL (ref 78.0–100.0)
MONOS PCT: 9 % (ref 3–12)
Monocytes Absolute: 0.9 10*3/uL (ref 0.1–1.0)
NEUTROS PCT: 83 % — AB (ref 43–77)
Neutro Abs: 7.9 10*3/uL — ABNORMAL HIGH (ref 1.7–7.7)
Platelets: 204 10*3/uL (ref 150–400)
RBC: 3.83 MIL/uL — ABNORMAL LOW (ref 3.87–5.11)
RDW: 13.6 % (ref 11.5–15.5)
WBC: 9.6 10*3/uL (ref 4.0–10.5)

## 2015-02-15 LAB — PROTIME-INR
INR: 1.3 (ref 0.00–1.49)
Prothrombin Time: 16.3 seconds — ABNORMAL HIGH (ref 11.6–15.2)

## 2015-02-15 MED ORDER — OXYCODONE-ACETAMINOPHEN 5-325 MG PO TABS: 2.0000 | ORAL_TABLET | Freq: Once | ORAL | Status: DC

## 2015-02-15 MED ORDER — CLONIDINE HCL 0.1 MG PO TABS
0.1000 mg | ORAL_TABLET | Freq: Once | ORAL | Status: AC
  Administered 2015-02-15: 0.1 mg via ORAL
  Filled 2015-02-15: qty 1

## 2015-02-15 MED ORDER — FENTANYL CITRATE (PF) 100 MCG/2ML IJ SOLN
25.0000 ug | Freq: Once | INTRAMUSCULAR | Status: AC
  Administered 2015-02-15: 25 ug via INTRAVENOUS
  Filled 2015-02-15: qty 2

## 2015-02-15 MED ORDER — FENTANYL CITRATE (PF) 100 MCG/2ML IJ SOLN
50.0000 ug | Freq: Once | INTRAMUSCULAR | Status: AC
  Administered 2015-02-15: 50 ug via INTRAVENOUS
  Filled 2015-02-15: qty 2

## 2015-02-15 MED ORDER — DILTIAZEM HCL ER COATED BEADS 120 MG PO CP24
120.0000 mg | ORAL_CAPSULE | Freq: Once | ORAL | Status: AC
  Administered 2015-02-15: 120 mg via ORAL
  Filled 2015-02-15 (×2): qty 1

## 2015-02-15 MED ORDER — OXYCODONE-ACETAMINOPHEN 5-325 MG PO TABS: 1.0000 | ORAL_TABLET | Freq: Four times a day (QID) | ORAL | Status: DC | PRN

## 2015-02-15 MED ORDER — DILTIAZEM HCL 25 MG/5ML IV SOLN
10.0000 mg | Freq: Once | INTRAVENOUS | Status: AC
  Administered 2015-02-15: 10 mg via INTRAVENOUS
  Filled 2015-02-15: qty 5

## 2015-02-15 MED ORDER — POTASSIUM CHLORIDE CRYS ER 20 MEQ PO TBCR
40.0000 meq | EXTENDED_RELEASE_TABLET | Freq: Once | ORAL | Status: AC
  Administered 2015-02-15: 40 meq via ORAL
  Filled 2015-02-15: qty 2

## 2015-02-15 MED ORDER — MORPHINE SULFATE 4 MG/ML IJ SOLN
4.0000 mg | Freq: Once | INTRAMUSCULAR | Status: DC
Start: 2015-02-15 — End: 2015-02-15
  Filled 2015-02-15: qty 1

## 2015-02-15 NOTE — Progress Notes (Signed)
Orthopedic Tech Progress Note Patient Details:  [name] [birth year] [id number]  Patient ID: [name], female   DOB: April 19, 1936, 79 y.o.   MRN: [id number]  Viewed order from doctor's order list  Hildred Priest 02/15/2015, 11:34 AM

## 2015-02-15 NOTE — Progress Notes (Signed)
Orthopedic Tech Progress Note Patient Details:  [name] 02-Jul-1936 [id number]  Ortho Devices Type of Ortho Device: Sling immobilizer Ortho Device/Splint Location: lue Ortho Device/Splint Interventions: Application   [name] 02/15/2015, 11:34 AM

## 2015-02-15 NOTE — ED Provider Notes (Signed)
CSN: [id number]     Arrival date & time 02/15/15  0703 History   First MD Initiated Contact with Patient 02/15/15 [phone]     Chief Complaint  Patient presents with  . Arm Pain     (Consider location/radiation/quality/duration/timing/severity/associated sxs/prior Treatment) The history is provided by the patient.  RETTA PITCHER is a 79 y.o. female history of A. fib on Coumadin, CHF, stroke with aphasia here presenting with arm injury. Patient is from [facility]. 2 days ago she fell out of bed and lead abdominal left arm. She doesn't know if there is a head injury or not and patient is a phasic at baseline. Has progressive left arm pain since that time and was getting dressed this morning and the aide noticed that left upper arm is swollen and bruised. Patient unable to give much history.   Level V caveat- aphasia from previous stroke   Past Medical History  Diagnosis Date  . Depressive disorder, not elsewhere classified   . Atrial fibrillation   . Unspecified transient cerebral ischemia   . Unspecified cerebral artery occlusion with cerebral infarction   . Unspecified essential hypertension   . Diverticulosis of colon (without mention of hemorrhage)   . Stricture and stenosis of esophagus   . Seizure disorder   . GERD (gastroesophageal reflux disease)   . IBS (irritable bowel syndrome)   . Stroke   . CHF (congestive heart failure)   . Anemia   . Hyperlipidemia   . Arrhythmia   . Hx: UTI (urinary tract infection)    Past Surgical History  Procedure Laterality Date  . Hemorrhoid surgery    . Benign braintumor      Removal  . Flexible sigmoidoscopy  09/02/2011    Procedure: FLEXIBLE SIGMOIDOSCOPY;  Surgeon: Zenovia Jarred, MD;  Location: [facility] ENDOSCOPY;  Service: Gastroenterology;  Laterality: N/A;  . Hip arthroplasty  11/26/2011    Procedure: ARTHROPLASTY BIPOLAR HIP;  Surgeon: Alta Corning, MD;  Location: [facility] ORS;  Service: Orthopedics;  Laterality: Right;    Family History  Problem Relation Age of Onset  . Heart disease Mother   . Colon cancer Neg Hx    History  Substance Use Topics  . Smoking status: Never Smoker   . Smokeless tobacco: Never Used  . Alcohol Use: No   OB History    No data available     Review of Systems  Musculoskeletal:       L arm pain   All other systems reviewed and are negative.     Allergies  Ciprofloxacin; Codeine; Dilantin; Levetiracetam; Penicillins; and Phenytoin sodium extended  Home Medications   Prior to Admission medications   Medication Sig Start Date End Date Taking? Authorizing Provider  acetaminophen (TYLENOL) 500 MG tablet Take 500 mg by mouth every 4 (four) hours as needed (pain).    Yes Historical Provider, MD  atorvastatin (LIPITOR) 40 MG tablet Take 40 mg by mouth daily at 6 PM.    Yes Historical Provider, MD  carbamazepine (TEGRETOL) 200 MG tablet Take 100 mg by mouth 3 (three) times daily with meals.    Yes Historical Provider, MD  Cholecalciferol (VITAMIN D) 2000 UNITS tablet Take 4,000 Units by mouth daily.   Yes Historical Provider, MD  citalopram (CELEXA) 20 MG tablet Take 20 mg by mouth daily.   Yes Historical Provider, MD  cloNIDine (CATAPRES) 0.1 MG tablet Take 0.1 mg by mouth every 6 (six) hours as needed (Systolic BP over 110).  Yes Historical Provider, MD  conjugated estrogens (PREMARIN) vaginal cream Place 1 Applicatorful vaginally at bedtime. Apply  small amount on urethra daily   Yes Historical Provider, MD  diltiazem (CARDIZEM CD) 120 MG 24 hr capsule Take 120 mg by mouth daily. Take on an empty stomach 08/11/13  Yes Historical Provider, MD  ENSURE PLUS (ENSURE PLUS) LIQD Take 237 mLs by mouth 2 (two) times daily between meals.   Yes Historical Provider, MD  furosemide (LASIX) 20 MG tablet Take 1 tablet (20 mg total) by mouth daily. 11/30/11 02/15/15 Yes Samuella Cota, MD  hydrocortisone cream 1 % Apply 1 application topically at bedtime as needed (blood in stool).    Yes Historical Provider, MD  lisinopril (PRINIVIL,ZESTRIL) 40 MG tablet Take 40 mg by mouth daily.    Yes Historical Provider, MD  loperamide (IMODIUM A-D) 2 MG tablet Take 2 mg by mouth as needed for diarrhea or loose stools.   Yes Historical Provider, MD  LORazepam (ATIVAN) 0.5 MG tablet Take 0.5 mg by mouth at bedtime.   Yes Historical Provider, MD  metoprolol (LOPRESSOR) 100 MG tablet Take 1 tablet (100 mg total) by mouth 2 (two) times daily. 09/04/11 02/15/15 Yes Thurnell Lose, MD  nystatin (MYCOSTATIN/NYSTOP) 100000 UNIT/GM POWD Apply 1 g topically 3 (three) times daily as needed (itching). Apply to perineum sparingly   Yes Historical Provider, MD  ondansetron (ZOFRAN) 8 MG tablet Take 8 mg by mouth every 8 (eight) hours as needed for nausea or vomiting.   Yes Historical Provider, MD  saccharomyces boulardii (FLORASTOR) 250 MG capsule Take 250 mg by mouth 2 (two) times daily.    Yes Historical Provider, MD  Skin Protectants, Misc. (A+D FIRST AID) OINT Apply 1 application topically 2 (two) times daily.   Yes Historical Provider, MD  warfarin (COUMADIN) 5 MG tablet Take 5 mg by mouth daily.   Yes Historical Provider, MD  potassium chloride SA (K-DUR,KLOR-CON) 20 MEQ tablet Take 1 tablet (20 mEq total) by mouth 2 (two) times daily. Patient not taking: Reported on 02/15/2015 09/21/13   Davonna Belling, MD   BP 177/102 mmHg  Pulse 119  Temp(Src) 98.5 F (36.9 C) (Oral)  Resp 21  SpO2 92% Physical Exam  Constitutional:  Uncomfortable, chronically ill   HENT:  Head: Normocephalic and atraumatic.  Mouth/Throat: Oropharynx is clear and moist.  Eyes: Conjunctivae are normal. Pupils are equal, round, and reactive to light.  Neck: Normal range of motion. Neck supple.  Cardiovascular: Normal rate, regular rhythm and normal heart sounds.   Pulmonary/Chest: Effort normal and breath sounds normal. No respiratory distress. She has no wheezes. She has no rales.  Abdominal: Soft. Bowel sounds are  normal. She exhibits no distension. There is no tenderness. There is no rebound.  Musculoskeletal:  L humerus with diffuse tenderness and ecchymosis. No forearm tenderness. 2+ radial pulse, able to wiggle fingers. Pelvis stable. Nl hip ROM bilaterally   Neurological: She is alert. No cranial nerve deficit. Coordination normal.  demented  Skin: Skin is warm and dry.  Psychiatric:  unable  Nursing note and vitals reviewed.   ED Course  Procedures (including critical care time) Labs Review Labs Reviewed  CBC WITH DIFFERENTIAL/PLATELET - Abnormal; Notable for the following:    RBC 3.83 (*)    Neutrophils Relative % 83 (*)    Neutro Abs 7.9 (*)    Lymphocytes Relative 8 (*)    All other components within normal limits  COMPREHENSIVE METABOLIC PANEL - Abnormal; Notable  for the following:    Potassium 3.2 (*)    Glucose, Bld 115 (*)    Creatinine, Ser 1.18 (*)    Albumin 3.4 (*)    ALT 12 (*)    GFR calc non Af Amer 44 (*)    GFR calc Af Amer 51 (*)    All other components within normal limits  PROTIME-INR - Abnormal; Notable for the following:    Prothrombin Time 16.3 (*)    All other components within normal limits    Imaging Review Dg Chest 1 View  02/15/2015   CLINICAL DATA:  Pain following fall.  Atrial fibrillation.  EXAM: CHEST  1 VIEW  COMPARISON:  September 21, 2013  FINDINGS: Lungs are clear. Heart is upper normal in size with pulmonary vascularity within normal limits. No pneumothorax. No adenopathy.  There is a fracture of the proximal humeral metaphysis on the left which was not present previously.  IMPRESSION: Fracture proximal left humeral metaphysis. The humeral shaft is displaced medially on the left. There is no edema or consolidation. No pneumothorax.   Electronically Signed   By: Lowella Grip III M.D.   On: 02/15/2015 08:08   Dg Pelvis 1-2 Views  02/15/2015   CLINICAL DATA:  Fall with pelvic pain.  EXAM: PELVIS - 1-2 VIEW  COMPARISON:  11/26/2011  FINDINGS:  The bony pelvis appears intact without evidence of fracture or diastasis. Visualized components of a right hip arthroplasty show normal alignment. The tip of the femoral stem is not visualized. The left hip appears unremarkable. No soft tissue abnormalities.  IMPRESSION: No acute fracture identified.   Electronically Signed   By: Aletta Edouard M.D.   On: 02/15/2015 08:07   Ct Head Wo Contrast  02/15/2015   CLINICAL DATA:  Fall 2 days ago at nursing facility. Pain in the left upper arm.  EXAM: CT HEAD WITHOUT CONTRAST  CT CERVICAL SPINE WITHOUT CONTRAST  TECHNIQUE: Multidetector CT imaging of the head and cervical spine was performed following the standard protocol without intravenous contrast. Multiplanar CT image reconstructions of the cervical spine were also generated.  COMPARISON:  08/13/2013 head CT.  CT cervical spine 01/22/2013.  FINDINGS: CT HEAD FINDINGS  Encephalomalacia in the left frontal lobe with large cystic cavity in the left frontal lobe, stable since prior exam. Extensive chronic small vessel disease throughout the deep white matter. Prior left frontal craniotomy. No hemorrhage or acute infarction. Old right thalamic lacunar infarct. No acute calvarial abnormality. Visualized paranasal sinuses and mastoids clear. Orbital soft tissues unremarkable.  CT CERVICAL SPINE FINDINGS  Normal alignment. Prevertebral soft tissues are normal. No fracture. No epidural or paraspinal hematoma. Mild degenerative disc disease and facet disease throughout the cervical spine.  There is stranding noted within the subcutaneous soft tissues of the left lower neck and visualize left shoulder, presumably hematoma. Visualized lung apices are clear.  IMPRESSION: Postoperative and chronic changes within the brain. No acute intracranial abnormality.  No acute bony abnormality in the cervical spine. Mild degenerative disc and facet disease.  Stranding within the subcutaneous soft tissues of the left side of the neck and  extending into the left shoulder, presumably hematoma. No large measurable fluid collection.   Electronically Signed   By: Rolm Baptise M.D.   On: 02/15/2015 09:07   Ct Cervical Spine Wo Contrast  02/15/2015   CLINICAL DATA:  Fall 2 days ago at nursing facility. Pain in the left upper arm.  EXAM: CT HEAD WITHOUT CONTRAST  CT CERVICAL  SPINE WITHOUT CONTRAST  TECHNIQUE: Multidetector CT imaging of the head and cervical spine was performed following the standard protocol without intravenous contrast. Multiplanar CT image reconstructions of the cervical spine were also generated.  COMPARISON:  08/13/2013 head CT.  CT cervical spine 01/22/2013.  FINDINGS: CT HEAD FINDINGS  Encephalomalacia in the left frontal lobe with large cystic cavity in the left frontal lobe, stable since prior exam. Extensive chronic small vessel disease throughout the deep white matter. Prior left frontal craniotomy. No hemorrhage or acute infarction. Old right thalamic lacunar infarct. No acute calvarial abnormality. Visualized paranasal sinuses and mastoids clear. Orbital soft tissues unremarkable.  CT CERVICAL SPINE FINDINGS  Normal alignment. Prevertebral soft tissues are normal. No fracture. No epidural or paraspinal hematoma. Mild degenerative disc disease and facet disease throughout the cervical spine.  There is stranding noted within the subcutaneous soft tissues of the left lower neck and visualize left shoulder, presumably hematoma. Visualized lung apices are clear.  IMPRESSION: Postoperative and chronic changes within the brain. No acute intracranial abnormality.  No acute bony abnormality in the cervical spine. Mild degenerative disc and facet disease.  Stranding within the subcutaneous soft tissues of the left side of the neck and extending into the left shoulder, presumably hematoma. No large measurable fluid collection.   Electronically Signed   By: Rolm Baptise M.D.   On: 02/15/2015 09:07   Dg Humerus Left  02/15/2015    CLINICAL DATA:  [place], LEFT humerus pain.  EXAM: LEFT HUMERUS - 2+ VIEW  COMPARISON:  None.  FINDINGS: There is a transverse displaced fracture of the humerus across the neck. Slight comminution with a fragment involving the posterior humeral head. No dislocation. Osteopenia.  IMPRESSION: Transverse displaced fracture across the neck of the humerus. Slight comminution. No dislocation.   Electronically Signed   By: Rolla Flatten M.D.   On: 02/15/2015 08:07     EKG Interpretation   Date/Time:  Tuesday Feb 15 2015 11:24:14 EDT Ventricular Rate:  121 PR Interval:    QRS Duration: 93 QT Interval:  350 QTC Calculation: 497 R Axis:   61 Text Interpretation:  Atrial fibrillation Ventricular premature complex  Anteroseptal infarct, old Borderline ST depression, diffuse leads rapid  afib new since previous  Confirmed by Jameka Ivie  MD, Aniylah Avans ([id number]) on 02/15/2015  11:58:42 AM      MDM   Final diagnoses:  None    MERCI WALTHERS is a 79 y.o. female here presenting with left arm injury. She is on Coumadin and don't know if there is head injury. We'll get CT head and neck. We'll get x-rays. Patient hypertensive so we'll check some labs and give her daily meds.   11:59 AM Xray showed proximal humerus fracture. Good radial pulse and neurovascular intact otherwise. I discussed with daughter that it is generally not surgical. Placed in shoulder immobilizer. Patient also was hypertensive and went into rapid afib rate 120s despite pain meds. She is on cardizem and given 1 dose of cardizem 10 mg IV and heart rate controlled. No chest pain. Electrolytes showed K 3.2, supplemented. INR subtherapeutic at 1.3. CT head/neck unremarkable. Will have her f/u with ortho, PMD.      Wandra Arthurs, MD 02/15/15 1201

## 2015-02-15 NOTE — ED Notes (Signed)
Pt returned from x-ray. Placed BP on right leg, because according to daughter when pt is in pain she will contract her right arm. Pt could not relax right arm enough to place BP.

## 2015-02-15 NOTE — ED Notes (Signed)
Family at bedside. 

## 2015-02-15 NOTE — ED Notes (Signed)
[facility]- Pt fell 2 days ago at nursing facility. Staff reports pt c/o pain in the left upper arm. No deformity noted by EMS but significant bruising noted. Vitial signs stable. BP 120 palpated, HR 90bpm, 50mg  tylenol given by nursing staff. Pt non-verbal at baseline with arms/fist contracted.

## 2015-02-15 NOTE — Discharge Instructions (Signed)
Take tylenol for pain.  Take percocet every 6 hrs for severe pain.   Wear sling at all times. You can take it off to get dressed or undressed but please wear it otherwise. She has upper arm fracture so please do not hold her by right arm.   The swelling and discoloration will likely get worse before it gets better.   Observe for severe hand pain or lack of pulse left wrist.   Your INR is slightly low, discuss with your doctor about it.   See your primary care doctor.   See orthopedic doctor.   Return to ER if you have severe pain, chest pain, unable to move the left hand.

## 2015-02-15 NOTE — ED Notes (Signed)
Called pharmacy about Cardizem.

## 2015-02-17 ENCOUNTER — Encounter (HOSPITAL_COMMUNITY): Payer: Self-pay | Admitting: *Deleted

## 2015-02-17 ENCOUNTER — Inpatient Hospital Stay (HOSPITAL_COMMUNITY)
Admission: EM | Admit: 2015-02-17 | Discharge: 2015-02-22 | DRG: 690 | Disposition: A | Discharge status: Skilled Nursing Facility | Payer: Medicare Other | Attending: Internal Medicine | Admitting: Internal Medicine

## 2015-02-17 DIAGNOSIS — Z7901 Long term (current) use of anticoagulants: Secondary | ICD-10-CM | POA: Diagnosis not present

## 2015-02-17 DIAGNOSIS — E876 Hypokalemia: Secondary | ICD-10-CM | POA: Diagnosis present

## 2015-02-17 DIAGNOSIS — N179 Acute kidney failure, unspecified: Secondary | ICD-10-CM | POA: Diagnosis present

## 2015-02-17 DIAGNOSIS — Z96641 Presence of right artificial hip joint: Secondary | ICD-10-CM | POA: Diagnosis present

## 2015-02-17 DIAGNOSIS — R509 Fever, unspecified: Secondary | ICD-10-CM | POA: Diagnosis present

## 2015-02-17 DIAGNOSIS — F329 Major depressive disorder, single episode, unspecified: Secondary | ICD-10-CM | POA: Diagnosis present

## 2015-02-17 DIAGNOSIS — I482 Chronic atrial fibrillation: Secondary | ICD-10-CM

## 2015-02-17 DIAGNOSIS — R791 Abnormal coagulation profile: Secondary | ICD-10-CM

## 2015-02-17 DIAGNOSIS — F0390 Unspecified dementia without behavioral disturbance: Secondary | ICD-10-CM | POA: Diagnosis present

## 2015-02-17 DIAGNOSIS — E870 Hyperosmolality and hypernatremia: Secondary | ICD-10-CM

## 2015-02-17 DIAGNOSIS — N390 Urinary tract infection, site not specified: Secondary | ICD-10-CM | POA: Diagnosis present

## 2015-02-17 DIAGNOSIS — I10 Essential (primary) hypertension: Secondary | ICD-10-CM | POA: Diagnosis present

## 2015-02-17 DIAGNOSIS — B9689 Other specified bacterial agents as the cause of diseases classified elsewhere: Secondary | ICD-10-CM | POA: Diagnosis present

## 2015-02-17 DIAGNOSIS — E785 Hyperlipidemia, unspecified: Secondary | ICD-10-CM | POA: Diagnosis present

## 2015-02-17 DIAGNOSIS — I69320 Aphasia following cerebral infarction: Secondary | ICD-10-CM | POA: Diagnosis not present

## 2015-02-17 DIAGNOSIS — Z79899 Other long term (current) drug therapy: Secondary | ICD-10-CM | POA: Diagnosis not present

## 2015-02-17 DIAGNOSIS — Z8249 Family history of ischemic heart disease and other diseases of the circulatory system: Secondary | ICD-10-CM | POA: Diagnosis not present

## 2015-02-17 DIAGNOSIS — R4701 Aphasia: Secondary | ICD-10-CM

## 2015-02-17 DIAGNOSIS — N3001 Acute cystitis with hematuria: Secondary | ICD-10-CM | POA: Diagnosis not present

## 2015-02-17 DIAGNOSIS — R569 Unspecified convulsions: Secondary | ICD-10-CM | POA: Diagnosis present

## 2015-02-17 DIAGNOSIS — E860 Dehydration: Secondary | ICD-10-CM | POA: Diagnosis present

## 2015-02-17 LAB — CBC WITH DIFFERENTIAL/PLATELET
BASOS ABS: 0 10*3/uL (ref 0.0–0.1)
BASOS PCT: 0 % (ref 0–1)
Eosinophils Absolute: 0.1 10*3/uL (ref 0.0–0.7)
Eosinophils Relative: 1 % (ref 0–5)
HEMATOCRIT: 37.8 % (ref 36.0–46.0)
Hemoglobin: 12.1 g/dL (ref 12.0–15.0)
Lymphocytes Relative: 11 % — ABNORMAL LOW (ref 12–46)
Lymphs Abs: 1.3 10*3/uL (ref 0.7–4.0)
MCH: 33.1 pg (ref 26.0–34.0)
MCHC: 32 g/dL (ref 30.0–36.0)
MCV: 103.3 fL — ABNORMAL HIGH (ref 78.0–100.0)
MONO ABS: 1.1 10*3/uL — AB (ref 0.1–1.0)
Monocytes Relative: 9 % (ref 3–12)
Neutro Abs: 9.6 10*3/uL — ABNORMAL HIGH (ref 1.7–7.7)
Neutrophils Relative %: 79 % — ABNORMAL HIGH (ref 43–77)
Platelets: 256 10*3/uL (ref 150–400)
RBC: 3.66 MIL/uL — AB (ref 3.87–5.11)
RDW: 14 % (ref 11.5–15.5)
WBC: 12 10*3/uL — AB (ref 4.0–10.5)

## 2015-02-17 LAB — URINALYSIS, ROUTINE W REFLEX MICROSCOPIC
BILIRUBIN URINE: NEGATIVE
Glucose, UA: NEGATIVE mg/dL
Ketones, ur: NEGATIVE mg/dL
NITRITE: POSITIVE — AB
Protein, ur: 30 mg/dL — AB
Specific Gravity, Urine: 1.017 (ref 1.005–1.030)
Urobilinogen, UA: 0.2 mg/dL (ref 0.0–1.0)
pH: 6 (ref 5.0–8.0)

## 2015-02-17 LAB — URINE MICROSCOPIC-ADD ON

## 2015-02-17 LAB — BASIC METABOLIC PANEL
Anion gap: 11 (ref 5–15)
BUN: 25 mg/dL — ABNORMAL HIGH (ref 6–20)
CALCIUM: 9.3 mg/dL (ref 8.9–10.3)
CO2: 28 mmol/L (ref 22–32)
CREATININE: 1.18 mg/dL — AB (ref 0.44–1.00)
Chloride: 111 mmol/L (ref 101–111)
GFR calc Af Amer: 51 mL/min — ABNORMAL LOW (ref >60)
GFR calc non Af Amer: 44 mL/min — ABNORMAL LOW (ref >60)
Glucose, Bld: 119 mg/dL — ABNORMAL HIGH (ref 65–99)
POTASSIUM: 3.7 mmol/L (ref 3.5–5.1)
Sodium: 150 mmol/L — ABNORMAL HIGH (ref 135–145)

## 2015-02-17 LAB — PROTIME-INR
INR: 1.59 — ABNORMAL HIGH (ref 0.00–1.49)
Prothrombin Time: 19 seconds — ABNORMAL HIGH (ref 11.6–15.2)

## 2015-02-17 MED ORDER — LORAZEPAM 0.5 MG PO TABS
0.5000 mg | ORAL_TABLET | Freq: Every day | ORAL | Status: DC
  Administered 2015-02-17 – 2015-02-21 (×5): 0.5 mg via ORAL
  Filled 2015-02-17 (×5): qty 1

## 2015-02-17 MED ORDER — DEXTROSE 5 % IV SOLN
1.0000 g | INTRAVENOUS | Status: DC
  Administered 2015-02-17 – 2015-02-20 (×3): 1 g via INTRAVENOUS
  Filled 2015-02-17 (×3): qty 10

## 2015-02-17 MED ORDER — ENOXAPARIN SODIUM 80 MG/0.8ML ~~LOC~~ SOLN
70.0000 mg | Freq: Two times a day (BID) | SUBCUTANEOUS | Status: DC
  Administered 2015-02-17 – 2015-02-19 (×4): 70 mg via SUBCUTANEOUS
  Filled 2015-02-17 (×6): qty 0.8

## 2015-02-17 MED ORDER — CEFTRIAXONE SODIUM 1 G IJ SOLR
1.0000 g | Freq: Once | INTRAMUSCULAR | Status: AC
  Administered 2015-02-17: 1 g via INTRAVENOUS
  Filled 2015-02-17: qty 10

## 2015-02-17 MED ORDER — WARFARIN - PHARMACIST DOSING INPATIENT: Freq: Every day | Status: DC

## 2015-02-17 MED ORDER — KCL IN DEXTROSE-NACL 10-5-0.45 MEQ/L-%-% IV SOLN
INTRAVENOUS | Status: DC
  Administered 2015-02-17 – 2015-02-18 (×2): via INTRAVENOUS
  Filled 2015-02-17 (×5): qty 1000

## 2015-02-17 MED ORDER — SODIUM CHLORIDE 0.9 % IV BOLUS (SEPSIS)
1000.0000 mL | Freq: Once | INTRAVENOUS | Status: AC
  Administered 2015-02-17: 1000 mL via INTRAVENOUS

## 2015-02-17 MED ORDER — METOPROLOL TARTRATE 50 MG PO TABS
100.0000 mg | ORAL_TABLET | Freq: Two times a day (BID) | ORAL | Status: DC
  Administered 2015-02-17 – 2015-02-22 (×10): 100 mg via ORAL
  Filled 2015-02-17 (×10): qty 2

## 2015-02-17 MED ORDER — HYDRALAZINE HCL 20 MG/ML IJ SOLN
10.0000 mg | Freq: Three times a day (TID) | INTRAMUSCULAR | Status: DC | PRN
  Administered 2015-02-17 – 2015-02-21 (×5): 10 mg via INTRAVENOUS
  Filled 2015-02-17 (×5): qty 1

## 2015-02-17 MED ORDER — CARBAMAZEPINE 200 MG PO TABS
100.0000 mg | ORAL_TABLET | Freq: Three times a day (TID) | ORAL | Status: DC
  Administered 2015-02-17 – 2015-02-22 (×15): 100 mg via ORAL
  Filled 2015-02-17 (×20): qty 0.5

## 2015-02-17 MED ORDER — WARFARIN SODIUM 2.5 MG PO TABS
7.5000 mg | ORAL_TABLET | Freq: Once | ORAL | Status: AC
  Administered 2015-02-17: 7.5 mg via ORAL
  Filled 2015-02-17: qty 1

## 2015-02-17 MED ORDER — SODIUM CHLORIDE 0.9 % IV SOLN
Freq: Once | INTRAVENOUS | Status: DC
Start: 2015-02-17 — End: 2015-02-17

## 2015-02-17 MED ORDER — ATORVASTATIN CALCIUM 40 MG PO TABS
40.0000 mg | ORAL_TABLET | Freq: Every day | ORAL | Status: DC
  Administered 2015-02-18 – 2015-02-21 (×4): 40 mg via ORAL
  Filled 2015-02-17 (×4): qty 1

## 2015-02-17 NOTE — ED Provider Notes (Signed)
CSN: [id number]     Arrival date & time 02/17/15  1040 History   First MD Initiated Contact with Patient 02/17/15 1116     Chief Complaint  Patient presents with  . Fever     (Consider location/radiation/quality/duration/timing/severity/associated sxs/prior Treatment) HPI Comments: 79 yo female with hx of dementia who is nonverbal presenting from nursing facility with fever.  Pt is unable to provide any details due to dementia and nonverbal status.  Level V caveat applies.   Patient is a 79 y.o. female presenting with fever.  Fever Temp source:  Unable to specify Severity:  Unable to specify Duration: today. Timing:  Constant Progression:  Waxing and waning   Past Medical History  Diagnosis Date  . Depressive disorder, not elsewhere classified   . Atrial fibrillation   . Unspecified transient cerebral ischemia   . Unspecified cerebral artery occlusion with cerebral infarction   . Unspecified essential hypertension   . Diverticulosis of colon (without mention of hemorrhage)   . Stricture and stenosis of esophagus   . Seizure disorder   . GERD (gastroesophageal reflux disease)   . IBS (irritable bowel syndrome)   . Stroke   . CHF (congestive heart failure)   . Anemia   . Hyperlipidemia   . Arrhythmia   . Hx: UTI (urinary tract infection)    Past Surgical History  Procedure Laterality Date  . Hemorrhoid surgery    . Benign braintumor      Removal  . Flexible sigmoidoscopy  09/02/2011    Procedure: FLEXIBLE SIGMOIDOSCOPY;  Surgeon: Zenovia Jarred, MD;  Location: [facility] ENDOSCOPY;  Service: Gastroenterology;  Laterality: N/A;  . Hip arthroplasty  11/26/2011    Procedure: ARTHROPLASTY BIPOLAR HIP;  Surgeon: Alta Corning, MD;  Location: [facility] ORS;  Service: Orthopedics;  Laterality: Right;   Family History  Problem Relation Age of Onset  . Heart disease Mother   . Colon cancer Neg Hx    History  Substance Use Topics  . Smoking status: Never Smoker   . Smokeless tobacco: Never  Used  . Alcohol Use: No   OB History    No data available     Review of Systems  Unable to perform ROS Constitutional: Positive for fever.      Allergies  Ciprofloxacin; Codeine; Dilantin; Levetiracetam; Penicillins; and Phenytoin sodium extended  Home Medications   Prior to Admission medications   Medication Sig Start Date End Date Taking? Authorizing Provider  acetaminophen (TYLENOL) 500 MG tablet Take 500 mg by mouth every 4 (four) hours as needed (pain).    Yes Historical Provider, MD  atorvastatin (LIPITOR) 40 MG tablet Take 40 mg by mouth daily at 6 PM.    Yes Historical Provider, MD  carbamazepine (TEGRETOL) 200 MG tablet Take 100 mg by mouth 3 (three) times daily with meals.    Yes Historical Provider, MD  Cholecalciferol (VITAMIN D) 2000 UNITS tablet Take 4,000 Units by mouth daily.   Yes Historical Provider, MD  citalopram (CELEXA) 20 MG tablet Take 20 mg by mouth daily.   Yes Historical Provider, MD  cloNIDine (CATAPRES) 0.1 MG tablet Take 0.1 mg by mouth every 6 (six) hours as needed (Systolic BP over 716).   Yes Historical Provider, MD  conjugated estrogens (PREMARIN) vaginal cream Place 1 Applicatorful vaginally at bedtime. Apply  small amount on urethra daily   Yes Historical Provider, MD  diltiazem (CARDIZEM CD) 120 MG 24 hr capsule Take 120 mg by mouth daily. Take on an empty stomach  08/11/13  Yes Historical Provider, MD  ENSURE PLUS (ENSURE PLUS) LIQD Take 237 mLs by mouth 2 (two) times daily between meals.   Yes Historical Provider, MD  furosemide (LASIX) 20 MG tablet Take 1 tablet (20 mg total) by mouth daily. 11/30/11 02/17/15 Yes Samuella Cota, MD  hydrocortisone cream 1 % Apply 1 application topically at bedtime as needed (blood in stool).   Yes Historical Provider, MD  lisinopril (PRINIVIL,ZESTRIL) 40 MG tablet Take 40 mg by mouth daily.    Yes Historical Provider, MD  loperamide (IMODIUM A-D) 2 MG tablet Take 2 mg by mouth as needed for diarrhea or loose  stools.   Yes Historical Provider, MD  LORazepam (ATIVAN) 0.5 MG tablet Take 0.5 mg by mouth at bedtime.   Yes Historical Provider, MD  metoprolol (LOPRESSOR) 100 MG tablet Take 1 tablet (100 mg total) by mouth 2 (two) times daily. 09/04/11 02/17/15 Yes Thurnell Lose, MD  nystatin (MYCOSTATIN/NYSTOP) 100000 UNIT/GM POWD Apply 1 g topically 3 (three) times daily as needed (itching). Apply to perineum sparingly   Yes Historical Provider, MD  ondansetron (ZOFRAN) 8 MG tablet Take 8 mg by mouth every 8 (eight) hours as needed for nausea or vomiting.   Yes Historical Provider, MD  oxyCODONE-acetaminophen (PERCOCET) 5-325 MG per tablet Take 1-2 tablets by mouth every 6 (six) hours as needed. Patient taking differently: Take 1-2 tablets by mouth every 6 (six) hours as needed for moderate pain or severe pain.  02/15/15  Yes Wandra Arthurs, MD  saccharomyces boulardii (FLORASTOR) 250 MG capsule Take 250 mg by mouth 2 (two) times daily.    Yes Historical Provider, MD  Skin Protectants, Misc. (A+D FIRST AID) OINT Apply 1 application topically 2 (two) times daily.   Yes Historical Provider, MD  warfarin (COUMADIN) 5 MG tablet Take 5 mg by mouth daily.   Yes Historical Provider, MD  potassium chloride SA (K-DUR,KLOR-CON) 20 MEQ tablet Take 1 tablet (20 mEq total) by mouth 2 (two) times daily. Patient not taking: Reported on 02/15/2015 09/21/13   Davonna Belling, MD   BP 195/102 mmHg  Pulse 79  Temp(Src) 99.9 F (37.7 C) (Rectal)  Resp 14  SpO2 94% Physical Exam  Constitutional: She appears well-developed and well-nourished. No distress.  HENT:  Head: Normocephalic and atraumatic.  Mouth/Throat: Oropharynx is clear and moist.  Eyes: Conjunctivae are normal. Pupils are equal, round, and reactive to light. No scleral icterus.  Neck: Neck supple.  Cardiovascular: Normal rate, regular rhythm, normal heart sounds and intact distal pulses.   No murmur heard. Pulmonary/Chest: Effort normal and breath sounds  normal. No stridor. No respiratory distress. She has no rales.  Abdominal: Soft. Bowel sounds are normal. She exhibits no distension. There is no tenderness.  Musculoskeletal: Normal range of motion.  Neurological: She is alert.  Nonverbal. Follow basic commands.  Skin: Skin is warm and dry. No rash noted.  Psychiatric: She has a normal mood and affect. Her behavior is normal.  Nursing note and vitals reviewed.   ED Course  Procedures (including critical care time) Labs Review Labs Reviewed  URINALYSIS, ROUTINE W REFLEX MICROSCOPIC (NOT AT [facility]) - Abnormal; Notable for the following:    APPearance TURBID (*)    Hgb urine dipstick TRACE (*)    Protein, ur 30 (*)    Nitrite POSITIVE (*)    Leukocytes, UA TRACE (*)    All other components within normal limits  URINE MICROSCOPIC-ADD ON - Abnormal; Notable for the following:  Bacteria, UA MANY (*)    All other components within normal limits  CBC WITH DIFFERENTIAL/PLATELET - Abnormal; Notable for the following:    WBC 12.0 (*)    RBC 3.66 (*)    MCV 103.3 (*)    Neutrophils Relative % 79 (*)    Neutro Abs 9.6 (*)    Lymphocytes Relative 11 (*)    Monocytes Absolute 1.1 (*)    All other components within normal limits  BASIC METABOLIC PANEL - Abnormal; Notable for the following:    Sodium 150 (*)    Glucose, Bld 119 (*)    BUN 25 (*)    Creatinine, Ser 1.18 (*)    GFR calc non Af Amer 44 (*)    GFR calc Af Amer 51 (*)    All other components within normal limits  URINE CULTURE    Imaging Review No results found.   EKG Interpretation None      MDM   Final diagnoses:  Acute cystitis with hematuria  Hypernatremia    79 yo female with fever from nursing facility.  Found to have low grade temp here.  Odor of urine.  UTI evident on UA.  Also found to have leukocytosis and hypernatremia.  Plan abx and admit.     Serita Grit, MD 02/17/15 [phone]

## 2015-02-17 NOTE — ED Notes (Signed)
Patient was sent out this morning from [facility] for fever and hypertension. Patient was afebrile for EMS and the patient has not had any of her medications that today. The patient was recently seen at [facility] on Wednesday for a broken arm from a fall that had occurred 2 days prior. Patient has history of dementia and her mentation today is her baseline.

## 2015-02-17 NOTE — H&P (Signed)
History and Physical  [name] JQB:[id number] DOB: 1936-09-02 DOA: 02/17/2015  Referring physician: EDP PCP: Cari Caraway, MD   Chief Complaint: fever, odor in urine  HPI: [name] is a 79 y.o. female   Who is nonverbal , long term SNF resident was sent to ER due to above complaints. She was found to have uti, leukocytosis, uncontrolled HTN, she is treated with rocephin and admitted to hospitalist service.  Patient is not able to provide history, but does not seem in distress, smiling at times.  Review of Systems:  Detail per HPI, Review of systems are otherwise negative  Past Medical History  Diagnosis Date  . Depressive disorder, not elsewhere classified   . Atrial fibrillation   . Unspecified transient cerebral ischemia   . Unspecified cerebral artery occlusion with cerebral infarction   . Unspecified essential hypertension   . Diverticulosis of colon (without mention of hemorrhage)   . Stricture and stenosis of esophagus   . Seizure disorder   . GERD (gastroesophageal reflux disease)   . IBS (irritable bowel syndrome)   . Stroke   . CHF (congestive heart failure)   . Anemia   . Hyperlipidemia   . Arrhythmia   . Hx: UTI (urinary tract infection)    Past Surgical History  Procedure Laterality Date  . Hemorrhoid surgery    . Benign braintumor      Removal  . Flexible sigmoidoscopy  09/02/2011    Procedure: FLEXIBLE SIGMOIDOSCOPY;  Surgeon: Zenovia Jarred, MD;  Location: [facility] ENDOSCOPY;  Service: Gastroenterology;  Laterality: N/A;  . Hip arthroplasty  11/26/2011    Procedure: ARTHROPLASTY BIPOLAR HIP;  Surgeon: Alta Corning, MD;  Location: [facility] ORS;  Service: Orthopedics;  Laterality: Right;   Social History:  reports that she has never smoked. She has never used smokeless tobacco. She reports that she does not drink alcohol or use illicit drugs. Patient lives at [facility], dependent on ADLs.  Allergies  Allergen Reactions  . Ciprofloxacin Other (See Comments)   diarrhea/yeast infection  . Codeine Other (See Comments)    unknown  . Dilantin [Phenytoin Sodium Extended]   . Levetiracetam Nausea Only    Nausea developed while on this med, worsened when dose increased.  Nausea ceased when med was d/cd.  Marland Kitchen Penicillins Hives  . Phenytoin Sodium Extended Other (See Comments)    unknown    Family History  Problem Relation Age of Onset  . Heart disease Mother   . Colon cancer Neg Hx       Prior to Admission medications   Medication Sig Start Date End Date Taking? Authorizing Provider  acetaminophen (TYLENOL) 500 MG tablet Take 500 mg by mouth every 4 (four) hours as needed (pain).    Yes Historical Provider, MD  atorvastatin (LIPITOR) 40 MG tablet Take 40 mg by mouth daily at 6 PM.    Yes Historical Provider, MD  carbamazepine (TEGRETOL) 200 MG tablet Take 100 mg by mouth 3 (three) times daily with meals.    Yes Historical Provider, MD  Cholecalciferol (VITAMIN D) 2000 UNITS tablet Take 4,000 Units by mouth daily.   Yes Historical Provider, MD  citalopram (CELEXA) 20 MG tablet Take 20 mg by mouth daily.   Yes Historical Provider, MD  conjugated estrogens (PREMARIN) vaginal cream Place 1 Applicatorful vaginally at bedtime. Apply  small amount on urethra daily   Yes Historical Provider, MD  diltiazem (CARDIZEM CD) 120 MG 24 hr capsule Take 120 mg by mouth daily. Take on  an empty stomach 08/11/13  Yes Historical Provider, MD  ENSURE PLUS (ENSURE PLUS) LIQD Take 237 mLs by mouth 2 (two) times daily between meals.   Yes Historical Provider, MD  furosemide (LASIX) 20 MG tablet Take 1 tablet (20 mg total) by mouth daily. 11/30/11 02/17/15 Yes Samuella Cota, MD  hydrocortisone cream 1 % Apply 1 application topically at bedtime as needed (blood in stool).   Yes Historical Provider, MD  lisinopril (PRINIVIL,ZESTRIL) 40 MG tablet Take 40 mg by mouth daily.    Yes Historical Provider, MD  loperamide (IMODIUM A-D) 2 MG tablet Take 2 mg by mouth as needed for  diarrhea or loose stools.   Yes Historical Provider, MD  LORazepam (ATIVAN) 0.5 MG tablet Take 0.5 mg by mouth at bedtime.   Yes Historical Provider, MD  metoprolol (LOPRESSOR) 100 MG tablet Take 1 tablet (100 mg total) by mouth 2 (two) times daily. 09/04/11 02/17/15 Yes Thurnell Lose, MD  nystatin (MYCOSTATIN/NYSTOP) 100000 UNIT/GM POWD Apply 1 g topically 3 (three) times daily as needed (itching). Apply to perineum sparingly   Yes Historical Provider, MD  ondansetron (ZOFRAN) 8 MG tablet Take 8 mg by mouth every 8 (eight) hours as needed for nausea or vomiting.   Yes Historical Provider, MD  oxyCODONE-acetaminophen (PERCOCET) 5-325 MG per tablet Take 1-2 tablets by mouth every 6 (six) hours as needed. Patient taking differently: Take 1-2 tablets by mouth every 6 (six) hours as needed for moderate pain or severe pain.  02/15/15  Yes Wandra Arthurs, MD  saccharomyces boulardii (FLORASTOR) 250 MG capsule Take 250 mg by mouth 2 (two) times daily.    Yes Historical Provider, MD  Skin Protectants, Misc. (A+D FIRST AID) OINT Apply 1 application topically 2 (two) times daily.   Yes Historical Provider, MD  warfarin (COUMADIN) 5 MG tablet Take 5 mg by mouth daily.   Yes Historical Provider, MD  potassium chloride SA (K-DUR,KLOR-CON) 20 MEQ tablet Take 1 tablet (20 mEq total) by mouth 2 (two) times daily. Patient not taking: Reported on 02/15/2015 09/21/13   Davonna Belling, MD    Physical Exam: BP 152/82 mmHg  Pulse 102  Temp(Src) 99.9 F (37.7 C) (Rectal)  Resp 19  SpO2 94%  General:  NAD, smiling, only oriented to self, largely nonverbal, say yes occasionally, but not in appropriate context. Eyes: PERRL ENT: unremarkable Neck: supple, no JVD Cardiovascular: IRRR Respiratory: CTABL Abdomen: soft/ND/ND, positive bowel sounds Skin: no rash Musculoskeletal:  No edema Psychiatric: smiling Neurologic: chronic aphasia, not following commend          Labs on Admission:  Basic Metabolic  Panel:  Recent Labs Lab 02/15/15 0825 02/17/15 1324  NA 144 150*  K 3.2* 3.7  CL 106 111  CO2 27 28  GLUCOSE 115* 119*  BUN 17 25*  CREATININE 1.18* 1.18*  CALCIUM 9.5 9.3   Liver Function Tests:  Recent Labs Lab 02/15/15 0825  AST 19  ALT 12*  ALKPHOS 74  BILITOT 0.6  PROT 6.8  ALBUMIN 3.4*   No results for input(s): LIPASE, AMYLASE in the last 168 hours. No results for input(s): AMMONIA in the last 168 hours. CBC:  Recent Labs Lab 02/15/15 0825 02/17/15 1324  WBC 9.6 12.0*  NEUTROABS 7.9* 9.6*  HGB 12.5 12.1  HCT 37.6 37.8  MCV 98.2 103.3*  PLT 204 256   Cardiac Enzymes: No results for input(s): CKTOTAL, CKMB, CKMBINDEX, TROPONINI in the last 168 hours.  BNP (last 3 results) No  results for input(s): BNP in the last 8760 hours.  ProBNP (last 3 results) No results for input(s): PROBNP in the last 8760 hours.  CBG: No results for input(s): GLUCAP in the last 168 hours.  Radiological Exams on Admission: No results found.  EKG: chronic afib, rate controlled, not acute st/t changes  Assessment/Plan Present on Admission:  . UTI (urinary tract infection) . UTI (lower urinary tract infection)  UTI: with fever, leukocytosis,culture pending, continue rocephin.  Hypernatremia: likely secondary to dehydration. S/p ivf bolus in the ER, start d51/2saline.  ARF: combination from dehydration and uti, renal dosing meds, monitor cr. Hold lisinopril.  HTN, uncontrolled, continue betablocker, prn hydralazine for now.  Chronic afib, with subtherapeutic INR, continue betablocker, pharmacy to dose warfarin with lovenox bridging.  H/o CVA with chronic aphasia, appear at baseline.  h/o seizure, continue home meds. Tegretol.   Consultants: none  Code Status: full  Family Communication:  patient  Disposition Plan: admit to tele  Time spent: 82mins  Gregory Dowe MD, PhD [facility] Pager [phone] If 7PM-7AM, please contact night-coverage at  [email or web address], password [facility]

## 2015-02-17 NOTE — ED Notes (Signed)
Bed: WA09 Expected date:  Expected time:  Means of arrival:  Comments: EMS - elderly, fall 2 days ago, febrile and hypertensive, ? AMS

## 2015-02-17 NOTE — ED Notes (Signed)
Patient has minimal verbal response.

## 2015-02-17 NOTE — ED Notes (Signed)
Charge nurse 4w notified that patient will transported after reassessment of blood pressure.

## 2015-02-17 NOTE — ED Notes (Signed)
Nurse currently starting IV 

## 2015-02-17 NOTE — Progress Notes (Signed)
ANTICOAGULATION CONSULT NOTE - Initial Consult  Pharmacy Consult for Warfarin / Lovenox Indication: atrial fibrillation  Allergies  Allergen Reactions  . Ciprofloxacin Other (See Comments)    diarrhea/yeast infection  . Codeine Other (See Comments)    unknown  . Dilantin [Phenytoin Sodium Extended]   . Levetiracetam Nausea Only    Nausea developed while on this med, worsened when dose increased.  Nausea ceased when med was d/cd.  Marland Kitchen Penicillins Hives  . Phenytoin Sodium Extended Other (See Comments)    unknown    Patient Measurements:    Per RN at [facility], as of Feb 2016 - Weight: 73kg  Vital Signs: Temp: 99.9 F (37.7 C) (05/26 1332) Temp Source: Rectal (05/26 1332) BP: 198/79 mmHg (05/26 1600) Pulse Rate: 70 (05/26 1600)  Labs:  Recent Labs  02/15/15 0825 02/17/15 1324 02/17/15 1656  HGB 12.5 12.1  --   HCT 37.6 37.8  --   PLT 204 256  --   LABPROT 16.3*  --  19.0*  INR 1.30  --  1.59*  CREATININE 1.18* 1.18*  --     CrCl cannot be calculated (Unknown ideal weight.).   Medical History: Past Medical History  Diagnosis Date  . Depressive disorder, not elsewhere classified   . Atrial fibrillation   . Unspecified transient cerebral ischemia   . Unspecified cerebral artery occlusion with cerebral infarction   . Unspecified essential hypertension   . Diverticulosis of colon (without mention of hemorrhage)   . Stricture and stenosis of esophagus   . Seizure disorder   . GERD (gastroesophageal reflux disease)   . IBS (irritable bowel syndrome)   . Stroke   . CHF (congestive heart failure)   . Anemia   . Hyperlipidemia   . Arrhythmia   . Hx: UTI (urinary tract infection)     Medications:  See med rec Scheduled:   Infusions:  . [START ON 02/18/2015] cefTRIAXone (ROCEPHIN)  IV    . dextrose 5 % and 0.45 % NaCl with KCl 10 mEq/L     PRN: hydrALAZINE  Assessment: [name] with hx of dementia who is nonverbal presenting from nursing facility  with fever. Takes chronic warfarin for atrial fibrillation.  Pharmacy is consulted to continue dosing on admission.  Since INR is subtherapeutic, also consulted to bridge with lovenox.   Home dose is 5mg  daily - last dose taken 5/25 at 17:00  INR today 1.59  Hgb 12.1, consistent with previous values  Plts 256k, consistent with previous values  SCr 1.18, CrCl ~45 ml/min/1.58m2 (normalized)  Hematuria noted, MD attributing to UTI  Dysphagia diet ordered  Goal of Therapy:  INR 2-3 Monitor platelets by anticoagulation protocol: Yes   Plan:   Warfarin 7.5mg  PO x 1 tonight  Daily PT/INR  Lovenox 70mg  sq q12h until INR is therapeutic  [name], PharmD, BCPS Pager: [phone] 02/17/2015,5:30 PM

## 2015-02-18 DIAGNOSIS — N390 Urinary tract infection, site not specified: Principal | ICD-10-CM

## 2015-02-18 LAB — CBC
HCT: 35.7 % — ABNORMAL LOW (ref 36.0–46.0)
Hemoglobin: 11.2 g/dL — ABNORMAL LOW (ref 12.0–15.0)
MCH: 32.3 pg (ref 26.0–34.0)
MCHC: 31.4 g/dL (ref 30.0–36.0)
MCV: 102.9 fL — AB (ref 78.0–100.0)
Platelets: 229 10*3/uL (ref 150–400)
RBC: 3.47 MIL/uL — ABNORMAL LOW (ref 3.87–5.11)
RDW: 14.1 % (ref 11.5–15.5)
WBC: 9.9 10*3/uL (ref 4.0–10.5)

## 2015-02-18 LAB — COMPREHENSIVE METABOLIC PANEL
ALT: 10 U/L — ABNORMAL LOW (ref 14–54)
AST: 15 U/L (ref 15–41)
Albumin: 3 g/dL — ABNORMAL LOW (ref 3.5–5.0)
Alkaline Phosphatase: 59 U/L (ref 38–126)
Anion gap: 9 (ref 5–15)
BUN: 26 mg/dL — AB (ref 6–20)
CALCIUM: 8.9 mg/dL (ref 8.9–10.3)
CHLORIDE: 109 mmol/L (ref 101–111)
CO2: 27 mmol/L (ref 22–32)
Creatinine, Ser: 0.83 mg/dL (ref 0.44–1.00)
GFR calc Af Amer: >60 mL/min (ref >60)
GFR calc non Af Amer: >60 mL/min (ref >60)
Glucose, Bld: 132 mg/dL — ABNORMAL HIGH (ref 65–99)
POTASSIUM: 3.3 mmol/L — AB (ref 3.5–5.1)
SODIUM: 145 mmol/L (ref 135–145)
Total Bilirubin: 0.6 mg/dL (ref 0.3–1.2)
Total Protein: 6.3 g/dL — ABNORMAL LOW (ref 6.5–8.1)

## 2015-02-18 LAB — PROTIME-INR
INR: 1.89 — ABNORMAL HIGH (ref 0.00–1.49)
PROTHROMBIN TIME: 21.6 s — AB (ref 11.6–15.2)

## 2015-02-18 LAB — MRSA PCR SCREENING: MRSA by PCR: NEGATIVE

## 2015-02-18 MED ORDER — ACETAMINOPHEN 325 MG PO TABS
650.0000 mg | ORAL_TABLET | ORAL | Status: DC | PRN
  Administered 2015-02-18: 650 mg via ORAL
  Filled 2015-02-18: qty 2

## 2015-02-18 MED ORDER — OXYCODONE-ACETAMINOPHEN 5-325 MG PO TABS
1.0000 | ORAL_TABLET | ORAL | Status: DC | PRN
  Administered 2015-02-18 – 2015-02-21 (×7): 2 via ORAL
  Filled 2015-02-18 (×8): qty 2

## 2015-02-18 MED ORDER — POTASSIUM CHLORIDE CRYS ER 20 MEQ PO TBCR
40.0000 meq | EXTENDED_RELEASE_TABLET | Freq: Once | ORAL | Status: AC
  Administered 2015-02-18: 40 meq via ORAL
  Filled 2015-02-18: qty 2

## 2015-02-18 MED ORDER — CHLORHEXIDINE GLUCONATE 0.12 % MT SOLN
15.0000 mL | Freq: Two times a day (BID) | OROMUCOSAL | Status: DC
  Administered 2015-02-18 – 2015-02-22 (×6): 15 mL via OROMUCOSAL
  Filled 2015-02-18 (×7): qty 15

## 2015-02-18 MED ORDER — CETYLPYRIDINIUM CHLORIDE 0.05 % MT LIQD
7.0000 mL | Freq: Two times a day (BID) | OROMUCOSAL | Status: DC
  Administered 2015-02-18 – 2015-02-21 (×7): 7 mL via OROMUCOSAL

## 2015-02-18 MED ORDER — WARFARIN SODIUM 5 MG PO TABS
5.0000 mg | ORAL_TABLET | Freq: Once | ORAL | Status: AC
  Administered 2015-02-18: 5 mg via ORAL
  Filled 2015-02-18: qty 1

## 2015-02-18 MED ORDER — RESOURCE THICKENUP CLEAR PO POWD
ORAL | Status: DC | PRN
  Filled 2015-02-18 (×3): qty 125

## 2015-02-18 NOTE — Progress Notes (Signed)
Initial Nutrition Assessment  DOCUMENTATION CODES:  Not applicable  INTERVENTION: - Continue Magic Cup - RD to continue to monitor for needs  NUTRITION DIAGNOSIS:  Swallowing difficulty related to dysphagia as evidenced by other (see comment) (need for dysphagia 1, honey thick liquids).  GOAL:  Patient will meet greater than or equal to 90% of their needs  MONITOR:  PO intake, Supplement acceptance, Weight trends, Labs, I & O's  REASON FOR ASSESSMENT:  Low Braden  ASSESSMENT: Pt admitted for fever and found to have UTI, leukocytosis, uncontrolled HTN. Pt is from a SNF and is nonverbal.  Pt seen for Low Braden with BMI indicating overweight status. Pt unable to provide information due to nonverbal status and no family present. Tech setting up pt and lunch tray at time of visit. She reports that pt ate 6 bites of oatmeal and 100% of Magic Cup at breakfast; chart review indicates intake of breakfast recorded at 40%. She states pt did not have any difficulty swallowing or any coughing during breakfast.  Pt likely meeting needs PTA but unable to state with certainty. Physical assessment does not show muscle or fat wasting. Labs and medications reviewed; K: 3.3 mmol/L, BUN elevated.  Height:  Ht Readings from Last 1 Encounters:  02/18/15 5' (1.524 m)    Weight:  Wt Readings from Last 1 Encounters:  02/18/15 145 lb 11.6 oz (66.1 kg)    Ideal Body Weight:  45.45 kg (kg)  Wt Readings from Last 10 Encounters:  02/18/15 145 lb 11.6 oz (66.1 kg)  11/30/11 154 lb 1.6 oz (69.9 kg)  08/31/11 157 lb 10.1 oz (71.5 kg)  08/01/11 165 lb (74.844 kg)  06/12/11 167 lb 12.8 oz (76.114 kg)  09/19/09 191 lb 12.8 oz (87 kg)  08/12/09 192 lb 8 oz (87.317 kg)  07/29/09 194 lb 4 oz (88.111 kg)    BMI:  Body mass index is 28.46 kg/(m^2).  Estimated Nutritional Needs:  Kcal:  1350-1550  Protein:  60-75 grams  Fluid:  2 L/day  Skin:  Reviewed, no issues  Diet Order:  DIET DYS 3  Room service appropriate?: Yes; Fluid consistency:: Nectar Thick  EDUCATION NEEDS:  No education needs identified at this time   Intake/Output Summary (Last 24 hours) at 02/18/15 1309 Last data filed at 02/18/15 1244  Gross per 24 hour  Intake 1076.25 ml  Output      0 ml  Net 1076.25 ml    Last BM:  PTA   Jarome Matin, RD, LDN Inpatient Clinical Dietitian Pager # [phone] After hours/weekend pager # [phone]

## 2015-02-18 NOTE — Progress Notes (Signed)
ANTICOAGULATION CONSULT NOTE - Follow Up Consult  Pharmacy Consult for warfarin/enoxaparin Indication: atrial fibrillation  Allergies  Allergen Reactions  . Ciprofloxacin Other (See Comments)    diarrhea/yeast infection  . Codeine Other (See Comments)    unknown  . Dilantin [Phenytoin Sodium Extended]   . Levetiracetam Nausea Only    Nausea developed while on this med, worsened when dose increased.  Nausea ceased when med was d/cd.  Marland Kitchen Penicillins Hives  . Phenytoin Sodium Extended Other (See Comments)    unknown    Patient Measurements: Height: 5' (152.4 cm) Weight: 145 lb 11.2 oz (66.089 kg) IBW/kg (Calculated) : 45.5  Vital Signs: Temp: 98.3 F (36.8 C) (05/27 0541) Temp Source: Oral (05/27 0541) BP: 144/88 mmHg (05/27 0639) Pulse Rate: 86 (05/27 0541)  Labs:  Recent Labs  02/17/15 1324 02/17/15 1656 02/18/15 0511  HGB 12.1  --  11.2*  HCT 37.8  --  35.7*  PLT 256  --  229  LABPROT  --  19.0* 21.6*  INR  --  1.59* 1.89*  CREATININE 1.18*  --  0.83    Estimated Creatinine Clearance: 48.9 mL/min (by C-G formula based on Cr of 0.83).  Assessment: 79 yo female with hx of dementia who is nonverbal presenting from nursing facility with fever. Takes chronic warfarin for atrial fibrillation.  Pharmacy is consulted to continue dosing on admission.  Since INR is subtherapeutic, also consulted to bridge with lovenox.   Prior anticoagulation: warfarin 5 mg PO daily  Significant events:  Today, 02/18/2015:  CBC: Hgb sl low (probably dilutional); Plt wnl  Renal: AKI; SCr improved to wnl; CrCl 49 ml/min  INR subtherapeutic but improved after 1 dose warfarin  Major drug interactions  No bleeding issues per nursing.  Minor hematuria noted on admission, MD attributing to UTI  Eating 40% of meals  Goal of Therapy: INR 2-3 Monitor platelets by anticoagulation protocol: Yes  Plan:  Warfarin 5 mg PO tonight at 18:00  Lovenox 70mg  sq q12h until INR is  therapeutic  Daily INR  CBC at least q72 hr while on warfarin  Monitor for signs of bleeding or thrombosis   Reuel Boom, PharmD Pager: [phone] 02/18/2015, 9:54 AM

## 2015-02-18 NOTE — Progress Notes (Signed)
[facility] PROGRESS NOTE  [name] HWE:[id number] DOB: [birth year] DOA: 02/17/2015 PCP: Cari Caraway, MD  Assessment/Plan: 1. Fever possibly from gram neg rod UTI: ON ROCEPHIN and cultures and pending.    2. Hypernatremia: much improved with fluids.    3. Hypertension: well controlled.   4. Chronic atrial fibrillation:  Rate well controlled, btu sub therapeutic INR.  On coumadin and lovenox for bridging.    H/o CVA: appears at baseline.   H/o seizures; no activity since admission. Resume tegretol.    Hypokalemia: Replete as needed. Repeat in am.      Code Status: full code Family Communication: none at bedside Disposition Plan: pending.    Consultants:  none  Procedures:  none  Antibiotics: rocephin HPI/Subjective: Pleasant, denies any new complaints.   Objective: Filed Vitals:   02/18/15 1509  BP: 159/98  Pulse: 105  Temp: 98.1 F (36.7 C)  Resp: 20    Intake/Output Summary (Last 24 hours) at 02/18/15 1747 Last data filed at 02/18/15 1729  Gross per 24 hour  Intake 1426.25 ml  Output      0 ml  Net 1426.25 ml   Filed Weights   02/18/15 0541 02/18/15 1304  Weight: 66.089 kg (145 lb 11.2 oz) 66.1 kg (145 lb 11.6 oz)    Exam:   General:  Alert but confused  Cardiovascular: s1s2  Respiratory: ctab, no wheezing or rhonchi  Abdomen: soft non tender non distended bowel sounds heard  Musculoskeletal: no edema.   Data Reviewed: Basic Metabolic Panel:  Recent Labs Lab 02/15/15 0825 02/17/15 1324 02/18/15 0511  NA 144 150* 145  K 3.2* 3.7 3.3*  CL 106 111 109  CO2 27 28 27   GLUCOSE 115* 119* 132*  BUN 17 25* 26*  CREATININE 1.18* 1.18* 0.83  CALCIUM 9.5 9.3 8.9   Liver Function Tests:  Recent Labs Lab 02/15/15 0825 02/18/15 0511  AST 19 15  ALT 12* 10*  ALKPHOS 74 59  BILITOT 0.6 0.6  PROT 6.8 6.3*  ALBUMIN 3.4* 3.0*   No results for input(s): LIPASE, AMYLASE in the last 168 hours. No results for  input(s): AMMONIA in the last 168 hours. CBC:  Recent Labs Lab 02/15/15 0825 02/17/15 1324 02/18/15 0511  WBC 9.6 12.0* 9.9  NEUTROABS 7.9* 9.6*  --   HGB 12.5 12.1 11.2*  HCT 37.6 37.8 35.7*  MCV 98.2 103.3* 102.9*  PLT 204 256 229   Cardiac Enzymes: No results for input(s): CKTOTAL, CKMB, CKMBINDEX, TROPONINI in the last 168 hours. BNP (last 3 results) No results for input(s): BNP in the last 8760 hours.  ProBNP (last 3 results) No results for input(s): PROBNP in the last 8760 hours.  CBG: No results for input(s): GLUCAP in the last 168 hours.  Recent Results (from the past 240 hour(s))  Urine culture     Status: None (Preliminary result)   Collection Time: 02/17/15 11:16 AM  Result Value Ref Range Status   Specimen Description URINE, CATHETERIZED  Final   Special Requests NONE  Final   Colony Count   Final    >=100,000 COLONIES/ML Performed at [facility]    Culture   Final    [place] Performed at [facility]    Report Status PENDING  Incomplete  MRSA PCR Screening     Status: None   Collection Time: 02/18/15  9:56 AM  Result Value Ref Range Status   MRSA by PCR NEGATIVE NEGATIVE Final    Comment:  The GeneXpert MRSA Assay (FDA approved for NASAL specimens only), is one component of a comprehensive MRSA colonization surveillance program. It is not intended to diagnose MRSA infection nor to guide or monitor treatment for MRSA infections.      Studies: No results found.  Scheduled Meds: . antiseptic oral rinse  7 mL Mouth Rinse q12n4p  . atorvastatin  40 mg Oral q1800  . carbamazepine  100 mg Oral TID WC  . cefTRIAXone (ROCEPHIN)  IV  1 g Intravenous Q24H  . chlorhexidine  15 mL Mouth Rinse BID  . enoxaparin (LOVENOX) injection  70 mg Subcutaneous Q12H  . LORazepam  0.5 mg Oral QHS  . metoprolol  100 mg Oral BID  . Warfarin - Pharmacist Dosing Inpatient   Does not apply q1800   Continuous Infusions: .  dextrose 5 % and 0.45 % NaCl with KCl 10 mEq/L 75 mL/hr at 02/18/15 0600    Active Problems:   UTI (urinary tract infection)   UTI (lower urinary tract infection)    Time spent: 30 minutes    [facility] Pager [phone]. If 7PM-7AM, please contact night-coverage at [email or web address], password [facility] 02/18/2015, 5:47 PM  LOS: 1 day

## 2015-02-18 NOTE — Clinical Social Work Note (Signed)
Clinical Social Work Assessment  Patient Details  Name: [name] MRN: [id number] Date of Birth: 09-13-1936  Date of referral:  02/18/15               Reason for consult:  Discharge Planning                Permission sought to share information with:  Family Supports Permission granted to share information::  No (pt oriented to person only; pt next of kin-daughter contacted to complete assessment)  Name::     [name]  Agency::     Relationship::  daughter  Contact Information:  [phone]  Housing/Transportation Living arrangements for the past 2 months:  [place] of Information:  Adult Children Patient Interpreter Needed:  None Criminal Activity/Legal Involvement Pertinent to Current Situation/Hospitalization:  No - Comment as needed Significant Relationships:  Adult Children Lives with:  Facility Resident Do you feel safe going back to the place where you live?  Yes Need for family participation in patient care:  Yes (Comment) (pt with hx of dementia and oriented to person only)  Care giving concerns:  Pt admitted from [facility]. Pt daughter expressed concern that facility did not notify pt family of pt admission for 24 hours after pt sent to the hospital. Pt daughter has addressed this with [facility].    Social Worker assessment / plan:  CSW received referral that pt admitted from [facility].   CSW visited pt room. No family present at bedside. Per RN, pt oriented to person only.  CSW contacted pt daughter, [name] via telephone. CSW introduced self and explained role. Pt daughter confirmed that pt admitted from [facility]. Pt daughter shared that [facility] provides pt with extensive skilled level of care at the facility. Pt daughter reports that facility feeds pt and pt does not ambulate, but has before been able to transfer with assist at the facility. Pt daughter reports that [facility] was in the  process of getting a lift for pt at the facility. CSW discussed with pt daughter that PT evaluation has been ordered and explored pt daughter's feelings about rehab at [facility] if recommended. Pt daughter reports that the type of dementia that pt has limits her ability to comprehend and progress with therapy and pt daughter reports that pt has been to SNF in the past, but did not progress with therapy secondary to the dementia. Pt daughter reports that she will "cross that bridge" regarding further discussion about SNF once PT evaluates. Pt daughter reports that she anticipates plan will be to return to [facility] upon discharge. CSW provided support to pt daughter as she discussed that she was upset with [facility] as facility failed to notify pt family of pt admission to the hospital, but pt daughter has addressed this concern with [facility].   CSW contacted [facility] who confirmed that facility could accept pt back upon discharge if return to [facility] continues to be plan upon discharge.   CSW to follow up with pt daughter following PT evaluation to further discuss disposition plan.   CSW to continue to follow to provide support and assist with pt disposition needs.   Employment status:  Retired Nurse, adult PT Recommendations:  Not assessed at this time Information / Referral to community resources:     Patient/Family's Response to care:  Pt alert and oriented to person only. Pt  daughter supportive and actively involved in pt care. Pt daughter strong advocate for pt.   Patient/Family's Understanding of and Emotional Response to Diagnosis, Current Treatment, and Prognosis:  Pt daughter knowledgeable about pt diagnosis and treatment plan. Pt daughter anticipates that plan will be return to [facility] as ALF is provided pt skilled level of care with extensive assistance, but is open to further discussion following PT  evaluation.  Emotional Assessment Appearance:  Appears older than stated age Attitude/Demeanor/Rapport:  Unable to Assess Affect (typically observed):  Unable to Assess Orientation:  Oriented to Self Alcohol / Substance use:  Not Applicable Psych involvement (Current and /or in the community):  No (Comment)  Discharge Needs  Concerns to be addressed:  Discharge Planning Concerns Readmission within the last 30 days:  No Current discharge risk:  None Barriers to Discharge:  Continued Medical Work up   Ladell Pier, LCSW Coverage for [employer], [place]

## 2015-02-18 NOTE — Evaluation (Signed)
Clinical/Bedside Swallow Evaluation Patient Details  Name: [name] MRN: [id number] Date of Birth: 1936/06/27  Today's Date: 02/18/2015 Time: SLP Start Time (ACUTE ONLY): 1110 SLP Stop Time (ACUTE ONLY): 1211 SLP Time Calculation (min) (ACUTE ONLY): 61 min  Past Medical History:  Past Medical History  Diagnosis Date  . Depressive disorder, not elsewhere classified   . Atrial fibrillation   . Unspecified transient cerebral ischemia   . Unspecified cerebral artery occlusion with cerebral infarction   . Unspecified essential hypertension   . Diverticulosis of colon (without mention of hemorrhage)   . Stricture and stenosis of esophagus   . Seizure disorder   . GERD (gastroesophageal reflux disease)   . IBS (irritable bowel syndrome)   . Stroke   . CHF (congestive heart failure)   . Anemia   . Hyperlipidemia   . Arrhythmia   . Hx: UTI (urinary tract infection)    Past Surgical History:  Past Surgical History  Procedure Laterality Date  . Hemorrhoid surgery    . Benign braintumor      Removal  . Flexible sigmoidoscopy  09/02/2011    Procedure: FLEXIBLE SIGMOIDOSCOPY;  Surgeon: Zenovia Jarred, MD;  Location: [facility] ENDOSCOPY;  Service: Gastroenterology;  Laterality: N/A;  . Hip arthroplasty  11/26/2011    Procedure: ARTHROPLASTY BIPOLAR HIP;  Surgeon: Alta Corning, MD;  Location: [facility] ORS;  Service: Orthopedics;  Laterality: Right;   HPI:  79 year old female admitted with UTI,leukocytosis, uncontrolled HTN.  Pt longterm resident of SNF [facility]).  Phoned daughter for previous diet at SNF, which she reported is "Chopped" with thin liquids, and chocolate ensure with each meal.   Assessment / Plan / Recommendation Clinical Impression  Pt reportedly was on a dysphagia 2 (chopped) diet with thin liquids at SNF, however, currently, pt is coughing rather consistently with thin liquids.  Pt also was able to chew a cracker and the ice chip relatively well.  Will modify diet to Dys 3 with  chopped meats and Nectar thick liquids.  Pt followed commands well, and nods yes/no appropriately.  She appears to had relatively intact receptive language despite her significant expressive aphasia.    Aspiration Risk  Mild    Diet Recommendation Dysphagia 3 (Mech soft);Nectar   Medication Administration: Whole meds with puree Compensations: Slow rate;Small sips/bites;Check for pocketing    Other  Recommendations Oral Care Recommendations: Oral care BID;Staff/trained caregiver to provide oral care Other Recommendations: Order thickener from pharmacy;Clarify dietary restrictions   Follow Up Recommendations       Frequency and Duration min 2x/week  2 weeks   Pertinent Vitals/Pain n/a        Swallow Study Prior Functional Status       General Other Pertinent Information: 79 year old female admitted with UTI,leukocytosis, uncontrolled HTN.  Pt longterm resident of SNF [facility]).  Phoned daughter for previous diet at SNF, which she reported is "Chopped" with thin liquids, and chocolate ensure with each meal. Type of Study: Bedside swallow evaluation Previous Swallow Assessment: Last MBS on file at [facility]: 08/24/11 Silent penetration of thin liquids Diet Prior to this Study: Dysphagia 1 (puree);Honey-thick liquids Temperature Spikes Noted: Yes (Low grade (has UTI)) Respiratory Status: Room air History of Recent Intubation: No Behavior/Cognition: Alert;Cooperative;Pleasant mood Oral Cavity - Dentition: Adequate natural dentition/normal for age Self-Feeding Abilities: Total assist Patient Positioning: Upright in bed Baseline Vocal Quality: Normal (Mostly non-verbal; occasional "yes") Volitional Cough: Cognitively unable to elicit Volitional Swallow: Able to elicit  Oral/Motor/Sensory Function Overall Oral Motor/Sensory Function: Appears within functional limits for tasks assessed   Ice Chips Ice chips: Impaired Presentation: Spoon Pharyngeal Phase Impairments: Suspected  delayed Swallow;Cough - Immediate   Thin Liquid Thin Liquid: Impaired Presentation: Spoon;Cup Pharyngeal  Phase Impairments: Suspected delayed Swallow;Throat Clearing - Immediate    Nectar Thick Nectar Thick Liquid: Within functional limits Presentation: Cup   Honey Thick Honey Thick Liquid: Not tested   Puree Puree: Within functional limits Presentation: Spoon   Solid   GO    Solid: Impaired Oral Phase Impairments: Reduced lingual movement/coordination;Impaired anterior to posterior transit Oral Phase Functional Implications:  (prolonged oral phase)       Quinn Axe T 02/18/2015,12:11 PM

## 2015-02-19 DIAGNOSIS — N3001 Acute cystitis with hematuria: Secondary | ICD-10-CM

## 2015-02-19 LAB — BASIC METABOLIC PANEL
Anion gap: 7 (ref 5–15)
BUN: 27 mg/dL — ABNORMAL HIGH (ref 6–20)
CALCIUM: 8.6 mg/dL — AB (ref 8.9–10.3)
CHLORIDE: 112 mmol/L — AB (ref 101–111)
CO2: 25 mmol/L (ref 22–32)
Creatinine, Ser: 0.98 mg/dL (ref 0.44–1.00)
GFR calc non Af Amer: 55 mL/min — ABNORMAL LOW (ref >60)
Glucose, Bld: 109 mg/dL — ABNORMAL HIGH (ref 65–99)
Potassium: 3.9 mmol/L (ref 3.5–5.1)
SODIUM: 144 mmol/L (ref 135–145)

## 2015-02-19 LAB — CBC
HCT: 34.2 % — ABNORMAL LOW (ref 36.0–46.0)
Hemoglobin: 10.8 g/dL — ABNORMAL LOW (ref 12.0–15.0)
MCH: 32.4 pg (ref 26.0–34.0)
MCHC: 31.6 g/dL (ref 30.0–36.0)
MCV: 102.7 fL — ABNORMAL HIGH (ref 78.0–100.0)
Platelets: 234 10*3/uL (ref 150–400)
RBC: 3.33 MIL/uL — ABNORMAL LOW (ref 3.87–5.11)
RDW: 14.2 % (ref 11.5–15.5)
WBC: 9.1 10*3/uL (ref 4.0–10.5)

## 2015-02-19 LAB — PROTIME-INR
INR: 4.55 — ABNORMAL HIGH (ref 0.00–1.49)
PROTHROMBIN TIME: 41.9 s — AB (ref 11.6–15.2)

## 2015-02-19 LAB — URINE CULTURE

## 2015-02-19 MED ORDER — STARCH (THICKENING) PO POWD: ORAL | Status: DC | PRN

## 2015-02-19 NOTE — Evaluation (Signed)
Physical Therapy Evaluation Patient Details Name: [name] MRN: [id number] DOB: November 10, 1935 Today's Date: 02/19/2015   History of Present Illness  79 yo female Who is nonverbal , long term SNF resident was sent to ER due to above complaints. She was found to have uti, leukocytosis, uncontrolled HTN. recent fall with  L humerus fracture 02/15/15. patient has a history of Stroke, afib.  Clinical Impression  Patient unable to assist in  Mobility, L extremeties very stiff, demonstrates no balance sitting.  Patient will need a mechanical lift  For OOB due to LUE injury. Patient is not a candidate for  Skilled PT after DC. Will provide trial of PT while in acute for positioning, pressure relief measures.     Follow Up Recommendations No PT follow up;Supervision/Assistance - 24 hour (patient  requires total assist, requires mechanical lift for safe OOB, do not feel patient is a candidate for skilled therapy at DC. IRecommend a  mechanical lift at ALF if returns. )    Equipment Recommendations  None recommended by PT    Recommendations for Other Services       Precautions / Restrictions Precautions Precautions: Fall Precaution Comments: LUE in sling Restrictions Weight Bearing Restrictions: Yes LUE Weight Bearing: Non weight bearing      Mobility  Bed Mobility Overal bed mobility: Needs Assistance Bed Mobility: Supine to Sit;Sit to Supine     Supine to sit: Total assist;HOB elevated Sit to supine: Total assist   General bed mobility comments: use of purple slide to  assist  turning on bed with bed pad. patient does not assist,  Transfers                    Ambulation/Gait                Stairs            Wheelchair Mobility    Modified Rankin (Stroke Patients Only)       Balance Overall balance assessment: History of Falls;Needs assistance Sitting-balance support: Feet supported;No upper extremity supported Sitting balance-Leahy Scale:  Zero Sitting balance - Comments: will fall aver if not held up                                     Pertinent Vitals/Pain Pain Assessment: Faces Faces Pain Scale: Hurts even more Pain Location: L ue Pain Descriptors / Indicators: Grimacing Pain Intervention(s): Limited activity within patient's tolerance;Monitored during session    Home Living Family/patient expects to be discharged to:: Skilled nursing facility                 Additional Comments: per chart notes per daughter,, faciklity was  in process of  obtaining a lift for getting patient OOB,    Prior Function Level of Independence: Needs assistance   Gait / Transfers Assistance Needed: non  ADL's / Homemaking Assistance Needed: total        Hand Dominance        Extremity/Trunk Assessment   Upper Extremity Assessment: LUE deficits/detail;RUE deficits/detail RUE Deficits / Details: hand fisted , has a foam   wedge in palm, does not move, holds when placed     LUE Deficits / Details: in sling, noted bruise upper arm   Lower Extremity Assessment: RLE deficits/detail;LLE deficits/detail RLE Deficits / Details: ankle equiniovarus, oes not move leg LLE Deficits / Details: ankle equino varus, did move  L leg a little on the bed     Communication   Communication: Expressive difficulties;Receptive difficulties  Cognition Arousal/Alertness: Awake/alert Behavior During Therapy: Flat affect Overall Cognitive Status: Difficult to assess                      General Comments      Exercises        Assessment/Plan    PT Assessment Patient needs continued PT services (trial-patient requires total care.)  PT Diagnosis Generalized weakness;Altered mental status   PT Problem List Decreased strength;Decreased range of motion;Decreased balance;Impaired tone  PT Treatment Interventions Functional mobility training;Therapeutic activities;Patient/family education   PT Goals (Current goals  can be found in the Care Plan section) Acute Rehab PT Goals PT Goal Formulation: Patient unable to participate in goal setting Time For Goal Achievement: 03/05/15 Potential to Achieve Goals: Poor    Frequency Min 2X/week   Barriers to discharge        Co-evaluation               End of Session   Activity Tolerance: Patient tolerated treatment well Patient left: in bed;with call bell/phone within reach;with bed alarm set Nurse Communication: Mobility status;Need for lift equipment         Time: 1316-1336 PT Time Calculation (min) (ACUTE ONLY): 20 min   Charges:   PT Evaluation $Initial PT Evaluation Tier I: 1 Procedure     PT G CodesClaretha Cooper 02/19/2015, 3:41 PM Tresa Endo PT [phone]

## 2015-02-19 NOTE — Progress Notes (Signed)
[facility] PROGRESS NOTE  [name] LKG:[id number] DOB: 1936-01-08 DOA: 02/17/2015 PCP: Cari Caraway, MD  Assessment/Plan: 1. Fever possibly from gram neg rod UTI: ON ROCEPHIN and cultures and pending.    2. Hypernatremia: much improved with fluids.    3. Hypertension: well controlled.   4. Chronic atrial fibrillation:  Rate  Not well controlled, monitor.  supra therapeutic INR.  On coumadin and dosing as per pharmacy.   H/o CVA: appears at baseline.   H/o seizures; no activity since admission. Resume tegretol.    Hypokalemia: Replete as needed. Repeat in am is normal      Code Status: full code Family Communication: family at bedside Disposition Plan: pending.    Consultants:  none  Procedures:  none  Antibiotics: rocephin HPI/Subjective: Pleasant, denies any new complaints.   Objective: Filed Vitals:   02/19/15 1441  BP: 153/85  Pulse: 101  Temp: 99.2 F (37.3 C)  Resp: 18    Intake/Output Summary (Last 24 hours) at 02/19/15 1551 Last data filed at 02/19/15 1300  Gross per 24 hour  Intake 2393.75 ml  Output      0 ml  Net 2393.75 ml   Filed Weights   02/18/15 0541 02/18/15 1304 02/19/15 0700  Weight: 66.089 kg (145 lb 11.2 oz) 66.1 kg (145 lb 11.6 oz) 66 kg (145 lb 8.1 oz)    Exam:   General:  Alert but confused  Cardiovascular: s1s2  Respiratory: ctab, no wheezing or rhonchi  Abdomen: soft non tender non distended bowel sounds heard  Musculoskeletal: no edema.   Data Reviewed: Basic Metabolic Panel:  Recent Labs Lab 02/15/15 0825 02/17/15 1324 02/18/15 0511 02/19/15 0524  NA 144 150* 145 144  K 3.2* 3.7 3.3* 3.9  CL 106 111 109 112*  CO2 27 28 27 25   GLUCOSE 115* 119* 132* 109*  BUN 17 25* 26* 27*  CREATININE 1.18* 1.18* 0.83 0.98  CALCIUM 9.5 9.3 8.9 8.6*   Liver Function Tests:  Recent Labs Lab 02/15/15 0825 02/18/15 0511  AST 19 15  ALT 12* 10*  ALKPHOS 74 59  BILITOT 0.6 0.6  PROT 6.8  6.3*  ALBUMIN 3.4* 3.0*   No results for input(s): LIPASE, AMYLASE in the last 168 hours. No results for input(s): AMMONIA in the last 168 hours. CBC:  Recent Labs Lab 02/15/15 0825 02/17/15 1324 02/18/15 0511 02/19/15 0524  WBC 9.6 12.0* 9.9 9.1  NEUTROABS 7.9* 9.6*  --   --   HGB 12.5 12.1 11.2* 10.8*  HCT 37.6 37.8 35.7* 34.2*  MCV 98.2 103.3* 102.9* 102.7*  PLT 204 256 229 234   Cardiac Enzymes: No results for input(s): CKTOTAL, CKMB, CKMBINDEX, TROPONINI in the last 168 hours. BNP (last 3 results) No results for input(s): BNP in the last 8760 hours.  ProBNP (last 3 results) No results for input(s): PROBNP in the last 8760 hours.  CBG: No results for input(s): GLUCAP in the last 168 hours.  Recent Results (from the past 240 hour(s))  Urine culture     Status: None   Collection Time: 02/17/15 11:16 AM  Result Value Ref Range Status   Specimen Description URINE, CATHETERIZED  Final   Special Requests NONE  Final   Colony Count   Final    >=100,000 COLONIES/ML Performed at [facility] Performed at [facility]    Report Status 02/19/2015 FINAL  Final   Organism ID, Bacteria  CITROBACTER KOSERI  Final      Susceptibility   Citrobacter koseri - MIC*    CEFAZOLIN <=4 SENSITIVE Sensitive     CEFTRIAXONE <=1 SENSITIVE Sensitive     CIPROFLOXACIN <=0.25 SENSITIVE Sensitive     GENTAMICIN <=1 SENSITIVE Sensitive     LEVOFLOXACIN <=0.12 SENSITIVE Sensitive     NITROFURANTOIN 32 SENSITIVE Sensitive     TOBRAMYCIN <=1 SENSITIVE Sensitive     TRIMETH/SULFA <=20 SENSITIVE Sensitive     PIP/TAZO 16 SENSITIVE Sensitive     * CITROBACTER KOSERI  Nasal culture     Status: None (Preliminary result)   Collection Time: 02/17/15 10:02 PM  Result Value Ref Range Status   Specimen Description NASOPHARYNGEAL  Final   Special Requests NONE  Final   Culture   Final    NORMAL NASOPHARYNGEAL FLORA Performed at [facility]    Report Status PENDING  Incomplete  MRSA PCR Screening     Status: None   Collection Time: 02/18/15  9:56 AM  Result Value Ref Range Status   MRSA by PCR NEGATIVE NEGATIVE Final    Comment:        The GeneXpert MRSA Assay (FDA approved for NASAL specimens only), is one component of a comprehensive MRSA colonization surveillance program. It is not intended to diagnose MRSA infection nor to guide or monitor treatment for MRSA infections.      Studies: No results found.  Scheduled Meds: . antiseptic oral rinse  7 mL Mouth Rinse q12n4p  . atorvastatin  40 mg Oral q1800  . carbamazepine  100 mg Oral TID WC  . cefTRIAXone (ROCEPHIN)  IV  1 g Intravenous Q24H  . chlorhexidine  15 mL Mouth Rinse BID  . LORazepam  0.5 mg Oral QHS  . metoprolol  100 mg Oral BID  . Warfarin - Pharmacist Dosing Inpatient   Does not apply q1800   Continuous Infusions:    Active Problems:   UTI (urinary tract infection)   UTI (lower urinary tract infection)    Time spent: 30 minutes    [facility] Pager [phone]. If 7PM-7AM, please contact night-coverage at [email or web address], password [facility] 02/19/2015, 3:51 PM  LOS: 2 days

## 2015-02-19 NOTE — Progress Notes (Signed)
ANTICOAGULATION CONSULT NOTE - Follow Up Consult  Pharmacy Consult for warfarin/enoxaparin Indication: atrial fibrillation  Allergies  Allergen Reactions  . Ciprofloxacin Other (See Comments)    diarrhea/yeast infection  . Codeine Other (See Comments)    unknown  . Dilantin [Phenytoin Sodium Extended]   . Levetiracetam Nausea Only    Nausea developed while on this med, worsened when dose increased.  Nausea ceased when med was d/cd.  Marland Kitchen Penicillins Hives  . Phenytoin Sodium Extended Other (See Comments)    unknown    Patient Measurements: Height: 5' (152.4 cm) Weight: 145 lb 8.1 oz (66 kg) IBW/kg (Calculated) : 45.5  Vital Signs: Temp: 98.2 F (36.8 C) (05/28 0528) Temp Source: Oral (05/28 0528) BP: 160/89 mmHg (05/28 0625) Pulse Rate: 87 (05/28 0528)  Labs:  Recent Labs  02/17/15 1324 02/17/15 1656 02/18/15 0511 02/19/15 0524  HGB 12.1  --  11.2* 10.8*  HCT 37.8  --  35.7* 34.2*  PLT 256  --  229 234  LABPROT  --  19.0* 21.6* 41.9*  INR  --  1.59* 1.89* 4.55*  CREATININE 1.18*  --  0.83 0.98    Estimated Creatinine Clearance: 41.4 mL/min (by C-G formula based on Cr of 0.98).  Assessment: 79 yo female with hx of dementia who is nonverbal presenting from nursing facility with fever. Takes chronic warfarin for atrial fibrillation.  Pharmacy is consulted to continue dosing on admission.  Since INR is subtherapeutic, also consulted to bridge with lovenox.   Prior anticoagulation: warfarin 5 mg PO daily  Significant events:  Today, 02/19/2015:  CBC: stable  Renal: SCr improved to wnl; CrCl 41 ml/min  INR rapidly increased and is now supratherapeutic after doses of 7.5mg  and 5mg   Major drug interactions - Tegretol noted but also on PTA  No bleeding issues per nursing.  Minor hematuria noted on admission, MD attributing to UTI  Eating 25-30% of meals  Goal of Therapy: INR 2-3 Monitor platelets by anticoagulation protocol: Yes  Plan: 1) Discontinue  Lovenox for supratherapeutic INR 2) No warfarin tonight for INR > 4 3) Daily INR 4) Monitor closely for signs/symptoms of bleeding  Adrian Saran, PharmD, BCPS Pager [phone] 02/19/2015 9:25 AM

## 2015-02-20 LAB — NASAL CULTURE (N/P): CULTURE: NORMAL

## 2015-02-20 LAB — PROTIME-INR
INR: 3.86 — ABNORMAL HIGH (ref 0.00–1.49)
Prothrombin Time: 37 seconds — ABNORMAL HIGH (ref 11.6–15.2)

## 2015-02-20 MED ORDER — CEPHALEXIN 500 MG PO CAPS
500.0000 mg | ORAL_CAPSULE | Freq: Two times a day (BID) | ORAL | Status: DC
  Administered 2015-02-20 – 2015-02-22 (×4): 500 mg via ORAL
  Filled 2015-02-20 (×4): qty 1

## 2015-02-20 MED ORDER — AMLODIPINE BESYLATE 5 MG PO TABS
5.0000 mg | ORAL_TABLET | Freq: Every day | ORAL | Status: DC
  Administered 2015-02-20 – 2015-02-21 (×2): 5 mg via ORAL
  Filled 2015-02-20 (×2): qty 1

## 2015-02-20 NOTE — Progress Notes (Signed)
[facility] PROGRESS NOTE  [name] TKZ:[id number] DOB: [birth year] DOA: 02/17/2015 PCP: Cari Caraway, MD  Assessment/Plan: 1. Fever possibly from citrobacter UTI: ON ROCEPHIN and cultures  Show citrobacter, pansensitive. Changed to oral keflex.     2. Hypernatremia: much improved with fluids.    3. Hypertension: well controlled.   4. Chronic atrial fibrillation:  Rate  Not well controlled, monitor.  supra therapeutic INR.  On coumadin and dosing as per pharmacy.   H/o CVA: appears at baseline.   H/o seizures; no activity since admission. Resume tegretol.    Hypokalemia: Replete as needed. Repeat in am is normal      Code Status: full code Family Communication: none at bedside Disposition Plan: plan to d/c in am    Consultants:  none  Procedures:  none  Antibiotics: rocephin HPI/Subjective: Pleasant, denies any new complaints. jus nods yes or no to questions. Cheerful.   Objective: Filed Vitals:   02/20/15 1120  BP: 170/70  Pulse: 68  Temp:   Resp:     Intake/Output Summary (Last 24 hours) at 02/20/15 1356 Last data filed at 02/20/15 1308  Gross per 24 hour  Intake    600 ml  Output      0 ml  Net    600 ml   Filed Weights   02/18/15 1304 02/19/15 0700 02/20/15 0500  Weight: 66.1 kg (145 lb 11.6 oz) 66 kg (145 lb 8.1 oz) 0.03 kg (1.1 oz)    Exam:   General:  Alert but confused  Cardiovascular: s1s2  Respiratory: ctab, no wheezing or rhonchi  Abdomen: soft non tender non distended bowel sounds heard  Musculoskeletal: no edema.   Data Reviewed: Basic Metabolic Panel:  Recent Labs Lab 02/15/15 0825 02/17/15 1324 02/18/15 0511 02/19/15 0524  NA 144 150* 145 144  K 3.2* 3.7 3.3* 3.9  CL 106 111 109 112*  CO2 27 28 27 25   GLUCOSE 115* 119* 132* 109*  BUN 17 25* 26* 27*  CREATININE 1.18* 1.18* 0.83 0.98  CALCIUM 9.5 9.3 8.9 8.6*   Liver Function Tests:  Recent Labs Lab 02/15/15 0825 02/18/15 0511  AST 19 15   ALT 12* 10*  ALKPHOS 74 59  BILITOT 0.6 0.6  PROT 6.8 6.3*  ALBUMIN 3.4* 3.0*   No results for input(s): LIPASE, AMYLASE in the last 168 hours. No results for input(s): AMMONIA in the last 168 hours. CBC:  Recent Labs Lab 02/15/15 0825 02/17/15 1324 02/18/15 0511 02/19/15 0524  WBC 9.6 12.0* 9.9 9.1  NEUTROABS 7.9* 9.6*  --   --   HGB 12.5 12.1 11.2* 10.8*  HCT 37.6 37.8 35.7* 34.2*  MCV 98.2 103.3* 102.9* 102.7*  PLT 204 256 229 234   Cardiac Enzymes: No results for input(s): CKTOTAL, CKMB, CKMBINDEX, TROPONINI in the last 168 hours. BNP (last 3 results) No results for input(s): BNP in the last 8760 hours.  ProBNP (last 3 results) No results for input(s): PROBNP in the last 8760 hours.  CBG: No results for input(s): GLUCAP in the last 168 hours.  Recent Results (from the past 240 hour(s))  Urine culture     Status: None   Collection Time: 02/17/15 11:16 AM  Result Value Ref Range Status   Specimen Description URINE, CATHETERIZED  Final   Special Requests NONE  Final   Colony Count   Final    >=100,000 COLONIES/ML Performed at [facility] Performed  at [facility]    Report Status 02/19/2015 FINAL  Final   Organism ID, Bacteria CITROBACTER KOSERI  Final      Susceptibility   Citrobacter koseri - MIC*    CEFAZOLIN <=4 SENSITIVE Sensitive     CEFTRIAXONE <=1 SENSITIVE Sensitive     CIPROFLOXACIN <=0.25 SENSITIVE Sensitive     GENTAMICIN <=1 SENSITIVE Sensitive     LEVOFLOXACIN <=0.12 SENSITIVE Sensitive     NITROFURANTOIN 32 SENSITIVE Sensitive     TOBRAMYCIN <=1 SENSITIVE Sensitive     TRIMETH/SULFA <=20 SENSITIVE Sensitive     PIP/TAZO 16 SENSITIVE Sensitive     * CITROBACTER KOSERI  Nasal culture     Status: None   Collection Time: 02/17/15 10:02 PM  Result Value Ref Range Status   Specimen Description NASOPHARYNGEAL  Final   Special Requests NONE  Final   Culture   Final    NORMAL  NASOPHARYNGEAL FLORA Performed at [facility]    Report Status 02/20/2015 FINAL  Final  MRSA PCR Screening     Status: None   Collection Time: 02/18/15  9:56 AM  Result Value Ref Range Status   MRSA by PCR NEGATIVE NEGATIVE Final    Comment:        The GeneXpert MRSA Assay (FDA approved for NASAL specimens only), is one component of a comprehensive MRSA colonization surveillance program. It is not intended to diagnose MRSA infection nor to guide or monitor treatment for MRSA infections.      Studies: No results found.  Scheduled Meds: . amLODipine  5 mg Oral Daily  . antiseptic oral rinse  7 mL Mouth Rinse q12n4p  . atorvastatin  40 mg Oral q1800  . carbamazepine  100 mg Oral TID WC  . cephALEXin  500 mg Oral Q12H  . chlorhexidine  15 mL Mouth Rinse BID  . LORazepam  0.5 mg Oral QHS  . metoprolol  100 mg Oral BID  . Warfarin - Pharmacist Dosing Inpatient   Does not apply q1800   Continuous Infusions:    Active Problems:   UTI (urinary tract infection)   UTI (lower urinary tract infection)    Time spent: 30 minutes    [facility] Pager [phone]. If 7PM-7AM, please contact night-coverage at [email or web address], password [facility] 02/20/2015, 1:56 PM  LOS: 3 days

## 2015-02-20 NOTE — Progress Notes (Signed)
ANTICOAGULATION CONSULT NOTE - Follow Up Consult  Pharmacy Consult for warfarin/enoxaparin Indication: atrial fibrillation  Allergies  Allergen Reactions  . Ciprofloxacin Other (See Comments)    diarrhea/yeast infection  . Codeine Other (See Comments)    unknown  . Dilantin [Phenytoin Sodium Extended]   . Levetiracetam Nausea Only    Nausea developed while on this med, worsened when dose increased.  Nausea ceased when med was d/cd.  Marland Kitchen Penicillins Hives  . Phenytoin Sodium Extended Other (See Comments)    unknown    Patient Measurements: Height: 5' (152.4 cm) Weight: (!) 1.1 oz (0.03 kg) (bed accidently zeroed out will zero when pt oob - pt refused) IBW/kg (Calculated) : 45.5  Vital Signs: Temp: 99.3 F (37.4 C) (05/29 0458) Temp Source: Oral (05/29 0458) BP: 185/78 mmHg (05/29 0933) Pulse Rate: 85 (05/29 0933)  Labs:  Recent Labs  02/17/15 1324  02/18/15 0511 02/19/15 0524 02/20/15 0516  HGB 12.1  --  11.2* 10.8*  --   HCT 37.8  --  35.7* 34.2*  --   PLT 256  --  229 234  --   LABPROT  --   < > 21.6* 41.9* 37.0*  INR  --   < > 1.89* 4.55* 3.86*  CREATININE 1.18*  --  0.83 0.98  --   < > = values in this interval not displayed.  Estimated Creatinine Clearance: 0.1 mL/min (by C-G formula based on Cr of 0.98).  Assessment: 79 yo female with hx of dementia who is nonverbal presenting from nursing facility with fever. Takes chronic warfarin for atrial fibrillation.  Pharmacy is consulted to continue dosing on admission.  Since INR is subtherapeutic, also consulted to bridge with lovenox.   Prior anticoagulation: warfarin 5 mg PO daily  Significant events:  Today, 02/20/2015:  CBC: stable  Renal: SCr improved to wnl; CrCl 41 ml/min  INR continues to be supratherapeutic but did decrease  Major drug interactions - Tegretol noted but also on PTA  No bleeding issues per nursing.  Minor hematuria noted on admission, MD attributing to UTI  Eating 25-30% of  meals  Goal of Therapy: INR 2-3 Monitor platelets by anticoagulation protocol: Yes  Plan: 1) Continue to hold warfarin tonight 3) Daily INR 4) Monitor closely for signs/symptoms of bleeding  Adrian Saran, PharmD, BCPS Pager [phone] 02/20/2015 10:18 AM

## 2015-02-20 NOTE — Clinical Social Work Note (Signed)
CSW spoke with pt's daughter to discuss discharge plans  CSW explained that pt's information was sent to [facility] where pt resides to see if they can accomodate  CSW also provided SNF's that have stated they can accommodate pt for her rehab needs  Pt's daughter does not want [facility] or [facility] for pt rehab  Pt's daugther [name]) will discuss with brother and pt's husband and call CSW with a choice for pt's discharge needs  Per MD note dated 02/20/15 pt may be ready for discharge in the am tomorrow  .Dede Query, LCSW [facility] Clinical Social Worker - Weekend Coverage cell #: [phone]

## 2015-02-21 LAB — PROTIME-INR
INR: 3.45 — AB (ref 0.00–1.49)
PROTHROMBIN TIME: 34 s — AB (ref 11.6–15.2)

## 2015-02-21 MED ORDER — DILTIAZEM HCL ER COATED BEADS 120 MG PO CP24
120.0000 mg | ORAL_CAPSULE | Freq: Every day | ORAL | Status: DC
  Administered 2015-02-21 – 2015-02-22 (×2): 120 mg via ORAL
  Filled 2015-02-21 (×2): qty 1

## 2015-02-21 MED ORDER — LISINOPRIL 20 MG PO TABS
20.0000 mg | ORAL_TABLET | Freq: Every day | ORAL | Status: DC
  Administered 2015-02-21 – 2015-02-22 (×2): 20 mg via ORAL
  Filled 2015-02-21 (×2): qty 1

## 2015-02-21 NOTE — Progress Notes (Addendum)
ANTICOAGULATION CONSULT NOTE - Follow Up Consult  Pharmacy Consult for warfarin Indication: atrial fibrillation  Allergies  Allergen Reactions  . Ciprofloxacin Other (See Comments)    diarrhea/yeast infection  . Codeine Other (See Comments)    unknown  . Dilantin [Phenytoin Sodium Extended]   . Levetiracetam Nausea Only    Nausea developed while on this med, worsened when dose increased.  Nausea ceased when med was d/cd.  Marland Kitchen Penicillins Hives  . Phenytoin Sodium Extended Other (See Comments)    unknown    Patient Measurements: Height: 5' (152.4 cm) Weight: (!) 1.1 oz (0.03 kg) (bed was zeroed c pt in it-being d/'d today -can weigh oob c ) IBW/kg (Calculated) : 45.5  Vital Signs: Temp: 97.7 F (36.5 C) (05/30 0609) Temp Source: Oral (05/30 0609) BP: 181/86 mmHg (05/30 0906) Pulse Rate: 86 (05/30 0609)  Labs:  Recent Labs  02/19/15 0524 02/20/15 0516 02/21/15 0418  HGB 10.8*  --   --   HCT 34.2*  --   --   PLT 234  --   --   LABPROT 41.9* 37.0* 34.0*  INR 4.55* 3.86* 3.45*  CREATININE 0.98  --   --     Estimated Creatinine Clearance: 0.1 mL/min (by C-G formula based on Cr of 0.98).  Assessment: 79 yo female with hx of dementia who is nonverbal presenting from nursing facility with fever. Takes chronic warfarin for atrial fibrillation.  Pharmacy is consulted to continue dosing on admission.   Home warfarin:  5 mg PO daily  Today, 02/21/2015:  CBC: stable on 5/28 (no new labs today)  Renal: SCr improved to wnl; CrCl 41 ml/min on 5/28  INR continues to be supratherapeutic but trending down  Major drug interactions - Tegretol noted but also on PTA; keflex  No bleeding documented  Eating 30% of meals  Goal of Therapy: INR 2-3 Monitor platelets by anticoagulation protocol: Yes  Plan: 1) Continue to hold warfarin tonight 2) Daily INR 3) Monitor closely for signs/symptoms of bleeding  Dia Sitter, PharmD, BCPS 02/21/2015 9:14 AM

## 2015-02-21 NOTE — Progress Notes (Signed)
[facility] PROGRESS NOTE  [name] NOI:[id number] DOB: 1936-07-05 DOA: 02/17/2015 PCP: Cari Caraway, MD  Assessment/Plan: 1. Fever possibly from citrobacter UTI: ON ROCEPHIN and cultures  Show citrobacter, pansensitive. Changed to oral keflex.     2. Hypernatremia: much improved with fluids.    3. Hypertension: sub optimal this am, started her on home meds , its better controlled.   4. Chronic atrial fibrillation:  Rate  Not well controlled, monitor.  supra therapeutic INR.  On coumadin and dosing as per pharmacy.   H/o CVA: appears at baseline.   H/o seizures; no activity since admission. Resume tegretol.    Hypokalemia: Replete as needed. Repeat in am is normal      Code Status: full code Family Communication: none at bedside Disposition Plan: d/c to [facility] in am.    Consultants:  none  Procedures:  none  Antibiotics: rocephin HPI/Subjective: Pleasant, denies any new complaints. jus nods yes or no to questions. Cheerful.   Objective: Filed Vitals:   02/21/15 1129  BP: 123/47  Pulse: 85  Temp: 97.7 F (36.5 C)  Resp: 20    Intake/Output Summary (Last 24 hours) at 02/21/15 1405 Last data filed at 02/21/15 0937  Gross per 24 hour  Intake   1080 ml  Output      0 ml  Net   1080 ml   Filed Weights   02/20/15 0500 02/21/15 0500 02/21/15 1022  Weight: 0.03 kg (1.1 oz) 0.03 kg (1.1 oz) 67.223 kg (148 lb 3.2 oz)    Exam:   General:  Alert but confused  Cardiovascular: s1s2  Respiratory: ctab, no wheezing or rhonchi  Abdomen: soft non tender non distended bowel sounds heard  Musculoskeletal: no edema.   Data Reviewed: Basic Metabolic Panel:  Recent Labs Lab 02/15/15 0825 02/17/15 1324 02/18/15 0511 02/19/15 0524  NA 144 150* 145 144  K 3.2* 3.7 3.3* 3.9  CL 106 111 109 112*  CO2 27 28 27 25   GLUCOSE 115* 119* 132* 109*  BUN 17 25* 26* 27*  CREATININE 1.18* 1.18* 0.83 0.98  CALCIUM 9.5 9.3 8.9 8.6*    Liver Function Tests:  Recent Labs Lab 02/15/15 0825 02/18/15 0511  AST 19 15  ALT 12* 10*  ALKPHOS 74 59  BILITOT 0.6 0.6  PROT 6.8 6.3*  ALBUMIN 3.4* 3.0*   No results for input(s): LIPASE, AMYLASE in the last 168 hours. No results for input(s): AMMONIA in the last 168 hours. CBC:  Recent Labs Lab 02/15/15 0825 02/17/15 1324 02/18/15 0511 02/19/15 0524  WBC 9.6 12.0* 9.9 9.1  NEUTROABS 7.9* 9.6*  --   --   HGB 12.5 12.1 11.2* 10.8*  HCT 37.6 37.8 35.7* 34.2*  MCV 98.2 103.3* 102.9* 102.7*  PLT 204 256 229 234   Cardiac Enzymes: No results for input(s): CKTOTAL, CKMB, CKMBINDEX, TROPONINI in the last 168 hours. BNP (last 3 results) No results for input(s): BNP in the last 8760 hours.  ProBNP (last 3 results) No results for input(s): PROBNP in the last 8760 hours.  CBG: No results for input(s): GLUCAP in the last 168 hours.  Recent Results (from the past 240 hour(s))  Urine culture     Status: None   Collection Time: 02/17/15 11:16 AM  Result Value Ref Range Status   Specimen Description URINE, CATHETERIZED  Final   Special Requests NONE  Final   Colony Count   Final    >=100,000 COLONIES/ML Performed at [facility]  Culture   Final    CITROBACTER KOSERI Performed at [facility]    Report Status 02/19/2015 FINAL  Final   Organism ID, Bacteria CITROBACTER KOSERI  Final      Susceptibility   Citrobacter koseri - MIC*    CEFAZOLIN <=4 SENSITIVE Sensitive     CEFTRIAXONE <=1 SENSITIVE Sensitive     CIPROFLOXACIN <=0.25 SENSITIVE Sensitive     GENTAMICIN <=1 SENSITIVE Sensitive     LEVOFLOXACIN <=0.12 SENSITIVE Sensitive     NITROFURANTOIN 32 SENSITIVE Sensitive     TOBRAMYCIN <=1 SENSITIVE Sensitive     TRIMETH/SULFA <=20 SENSITIVE Sensitive     PIP/TAZO 16 SENSITIVE Sensitive     * CITROBACTER KOSERI  Nasal culture     Status: None   Collection Time: 02/17/15 10:02 PM  Result Value Ref Range Status   Specimen Description  NASOPHARYNGEAL  Final   Special Requests NONE  Final   Culture   Final    NORMAL NASOPHARYNGEAL FLORA Performed at [facility]    Report Status 02/20/2015 FINAL  Final  MRSA PCR Screening     Status: None   Collection Time: 02/18/15  9:56 AM  Result Value Ref Range Status   MRSA by PCR NEGATIVE NEGATIVE Final    Comment:        The GeneXpert MRSA Assay (FDA approved for NASAL specimens only), is one component of a comprehensive MRSA colonization surveillance program. It is not intended to diagnose MRSA infection nor to guide or monitor treatment for MRSA infections.      Studies: No results found.  Scheduled Meds: . antiseptic oral rinse  7 mL Mouth Rinse q12n4p  . atorvastatin  40 mg Oral q1800  . carbamazepine  100 mg Oral TID WC  . cephALEXin  500 mg Oral Q12H  . chlorhexidine  15 mL Mouth Rinse BID  . diltiazem  120 mg Oral Daily  . lisinopril  20 mg Oral Daily  . LORazepam  0.5 mg Oral QHS  . metoprolol  100 mg Oral BID  . Warfarin - Pharmacist Dosing Inpatient   Does not apply q1800   Continuous Infusions:    Active Problems:   UTI (urinary tract infection)   UTI (lower urinary tract infection)    Time spent: 30 minutes    [facility] Pager [phone]. If 7PM-7AM, please contact night-coverage at [email or web address], password [facility] 02/21/2015, 2:05 PM  LOS: 4 days

## 2015-02-21 NOTE — Clinical Social Work Note (Signed)
CSW received a call from pt's daughter stating that she wants her mom to be discharged to [facility] SNF and to please call her to let her know when this will be happening as pt's spouse will be meeting pt at SNF  CSW will send message to [facility] to let them know that pt has chosen them for her rehab needs and follow up with a phone call today  .Dede Query, LCSW [facility] Clinical Social Worker - Weekend Coverage cell #: [phone]

## 2015-02-21 NOTE — Progress Notes (Signed)
Speech Language Pathology Treatment: Dysphagia  Patient Details Name: [name] MRN: [id number] DOB: Apr 03, 1936 Today's Date: 02/21/2015 Time: 6073-7106 SLP Time Calculation (min) (ACUTE ONLY): 19 min  Assessment / Plan / Recommendation Clinical Impression  Today pt is sleepy but did accept intake of water, stating "yeah" when asked if she liked water.  Water administered via tsp and cup after oral cavity checked for clearance.  Delayed swallow noted - suspect delayed oral transiting - with ? Premature spillage of liquid into pharynx/larynx.  Weak cough noted x1 of 3 boluses.  RN reports pt tolerating po medicine.    Due to pt's mental status, recommend continue modified diet but allow thin water between meals after oral care for comfort/hydration.  Hopeful for dietary advancement with improved medical condition given pt on thin liquids prior to admit.  Spoke to RN re: recommendations.     HPI Other Pertinent Information: 79 year old female admitted with UTI,leukocytosis, uncontrolled HTN.  Pt longterm resident of SNF [facility]).  Phoned daughter for previous diet at SNF, which she reported is "Chopped" with thin liquids, and chocolate ensure with each meal.   Pertinent Vitals Pain Assessment: Faces Pain Score: 0-No pain  SLP Plan  Continue with current plan of care    Recommendations Diet recommendations: Dysphagia 3 (mechanical soft);Nectar-thick liquid;Other(comment) (free water between meals after oral care) Medication Administration: Whole meds with puree Supervision: Full supervision/cueing for compensatory strategies Compensations: Slow rate;Small sips/bites;Check for pocketing Postural Changes and/or Swallow Maneuvers: Seated upright 90 degrees;Upright 30-60 min after meal              Oral Care Recommendations: Oral care BID Follow up Recommendations: Skilled Nursing facility (for readiness for dietary advancement) Plan: Continue with current plan of care    [place], Bertha [facility] SLP [phone]

## 2015-02-22 LAB — PROTIME-INR
INR: 2.82 — ABNORMAL HIGH (ref 0.00–1.49)
PROTHROMBIN TIME: 29.2 s — AB (ref 11.6–15.2)

## 2015-02-22 MED ORDER — METOPROLOL TARTRATE 100 MG PO TABS: 100.0000 mg | ORAL_TABLET | Freq: Two times a day (BID) | ORAL | Status: DC

## 2015-02-22 MED ORDER — WARFARIN SODIUM 5 MG PO TABS: 5.0000 mg | ORAL_TABLET | Freq: Once | ORAL | Status: DC

## 2015-02-22 MED ORDER — LORAZEPAM 0.5 MG PO TABS: 0.5000 mg | ORAL_TABLET | Freq: Every day | ORAL | Status: DC

## 2015-02-22 MED ORDER — CEPHALEXIN 500 MG PO CAPS: 500.0000 mg | ORAL_CAPSULE | Freq: Two times a day (BID) | ORAL | Status: DC

## 2015-02-22 MED ORDER — OXYCODONE-ACETAMINOPHEN 5-325 MG PO TABS: 1.0000 | ORAL_TABLET | Freq: Four times a day (QID) | ORAL | Status: DC | PRN

## 2015-02-22 MED ORDER — RESOURCE THICKENUP CLEAR PO POWD: ORAL | Status: DC

## 2015-02-22 NOTE — Progress Notes (Signed)
ANTICOAGULATION CONSULT NOTE - Follow Up Consult  Pharmacy Consult for warfarin Indication: atrial fibrillation  Allergies  Allergen Reactions  . Ciprofloxacin Other (See Comments)    diarrhea/yeast infection  . Codeine Other (See Comments)    unknown  . Dilantin [Phenytoin Sodium Extended]   . Levetiracetam Nausea Only    Nausea developed while on this med, worsened when dose increased.  Nausea ceased when med was d/cd.  Marland Kitchen Penicillins Hives  . Phenytoin Sodium Extended Other (See Comments)    unknown    Patient Measurements: Height: 5' (152.4 cm) Weight: 148 lb 12.8 oz (67.495 kg) IBW/kg (Calculated) : 45.5  Vital Signs: Temp: 98.4 F (36.9 C) (05/31 1356) Temp Source: Oral (05/31 1356) BP: 144/95 mmHg (05/31 1356) Pulse Rate: 87 (05/31 1356)  Labs:  Recent Labs  02/20/15 0516 02/21/15 0418 02/22/15 0438  LABPROT 37.0* 34.0* 29.2*  INR 3.86* 3.45* 2.82*    Estimated Creatinine Clearance: 41.9 mL/min (by C-G formula based on Cr of 0.98).  Assessment: 79 yo female with hx of dementia who is nonverbal presenting from nursing facility with fever. Takes chronic warfarin for atrial fibrillation.  Pharmacy is consulted to continue dosing on admission.   Home warfarin:  5 mg PO daily  Today, 02/22/2015:  CBC: stable on 5/28 (no new labs today)  Renal: SCr improved to wnl; CrCl 41 ml/min on 5/28  INR now therapeutic  Major drug interactions - Tegretol noted but also on PTA; keflex  No bleeding documented  Eating 100% of meals  Goal of Therapy: INR 2-3 Monitor platelets by anticoagulation protocol: Yes  Plan:  Warfarin 5 mg tonight.  Upon discharge, would consider slightly lower warfarin dosing (i.e. 4 mg daily) with close follow-up, given current antibiotics and supratherapeutic INR on presentation  Daily INR; CBC at least q72 hr while on warfarin  Monitor closely for signs/symptoms of bleeding  Reuel Boom, PharmD Pager: [phone] 02/22/2015,  2:43 PM

## 2015-02-22 NOTE — Discharge Summary (Signed)
Physician Discharge Summary  [name] CWC:[id number] DOB: 12-18-1935 DOA: 02/17/2015  PCP: Cari Caraway, MD  Admit date: 02/17/2015 Discharge date: 02/22/2015  Time spent: 30 minutes  Recommendations for Outpatient Follow-up:  1. Follow up w ith PCP in one week.   Discharge Diagnoses:  Active Problems:   UTI (urinary tract infection)   UTI (lower urinary tract infection) atrial fibrillation.  Hypertension.  Hypernatremia Hypokalemia Seizures CVA  Discharge Condition: improved.   Diet recommendation: DYSPHAGIA 3  Diet with nectar thick liquids.   Filed Weights   02/21/15 0500 02/21/15 1022 02/22/15 0400  Weight: 0.03 kg (1.1 oz) 67.223 kg (148 lb 3.2 oz) 67.495 kg (148 lb 12.8 oz)    History of present illness:  Who is nonverbal , long term SNF resident was sent to ER due to above complaints. She was found to have uti, leukocytosis, uncontrolled HTN, she is treated with rocephin and admitted to hospitalist service. Hospital Course:  1. Fever possibly from citrobacter UTI: ON ROCEPHIN and cultures Show citrobacter, pansensitive. Changed to oral keflex to complete the course   2. Hypernatremia: much improved with fluids.    3. Hypertension: sub optimal this am, started her on home meds , its better controlled.   4. Chronic atrial fibrillation:  Rate better controlled. INR therapeutic.  Resume coumadin, cardizem and BB.    H/o CVA: appears at baseline.   H/o seizures; no activity since admission. Resume tegretol.    Hypokalemia: Replete as needed. Repeat in am is normal   Procedures:  none  Consultations:  none  Discharge Exam: Filed Vitals:   02/22/15 0347  BP: 160/69  Pulse: 83  Temp: 98.3 F (36.8 C)  Resp: 16    General: alert afebrile comfortable Cardiovascular: s1s2 Respiratory: ctab  Discharge Instructions   Discharge Instructions    Diet - low sodium heart healthy    Complete by:  As directed      Discharge instructions     Complete by:  As directed   Follow upw ith PCP in one week.          Current Discharge Medication List    START taking these medications   Details  cephALEXin (KEFLEX) 500 MG capsule Take 1 capsule (500 mg total) by mouth every 12 (twelve) hours. Qty: 4 capsule, Refills: 0    Maltodextrin-Xanthan Gum ([place]) POWD As needed      CONTINUE these medications which have CHANGED   Details  LORazepam (ATIVAN) 0.5 MG tablet Take 1 tablet (0.5 mg total) by mouth at bedtime. Qty: 10 tablet, Refills: 0    metoprolol (LOPRESSOR) 100 MG tablet Take 1 tablet (100 mg total) by mouth 2 (two) times daily.    oxyCODONE-acetaminophen (PERCOCET) 5-325 MG per tablet Take 1-2 tablets by mouth every 6 (six) hours as needed for moderate pain or severe pain. Qty: 10 tablet, Refills: 0      CONTINUE these medications which have NOT CHANGED   Details  acetaminophen (TYLENOL) 500 MG tablet Take 500 mg by mouth every 4 (four) hours as needed (pain).     atorvastatin (LIPITOR) 40 MG tablet Take 40 mg by mouth daily at 6 PM.     carbamazepine (TEGRETOL) 200 MG tablet Take 100 mg by mouth 3 (three) times daily with meals.     Cholecalciferol (VITAMIN D) 2000 UNITS tablet Take 4,000 Units by mouth daily.    citalopram (CELEXA) 20 MG tablet Take 20 mg by mouth daily.    conjugated  estrogens (PREMARIN) vaginal cream Place 1 Applicatorful vaginally at bedtime. Apply  small amount on urethra daily    diltiazem (CARDIZEM CD) 120 MG 24 hr capsule Take 120 mg by mouth daily. Take on an empty stomach    ENSURE PLUS (ENSURE PLUS) LIQD Take 237 mLs by mouth 2 (two) times daily between meals.    hydrocortisone cream 1 % Apply 1 application topically at bedtime as needed (blood in stool).    lisinopril (PRINIVIL,ZESTRIL) 40 MG tablet Take 40 mg by mouth daily.     loperamide (IMODIUM A-D) 2 MG tablet Take 2 mg by mouth as needed for diarrhea or loose stools.    nystatin (MYCOSTATIN/NYSTOP)  100000 UNIT/GM POWD Apply 1 g topically 3 (three) times daily as needed (itching). Apply to perineum sparingly    ondansetron (ZOFRAN) 8 MG tablet Take 8 mg by mouth every 8 (eight) hours as needed for nausea or vomiting.    saccharomyces boulardii (FLORASTOR) 250 MG capsule Take 250 mg by mouth 2 (two) times daily.     Skin Protectants, Misc. (A+D FIRST AID) OINT Apply 1 application topically 2 (two) times daily.    warfarin (COUMADIN) 5 MG tablet Take 5 mg by mouth daily.      STOP taking these medications     furosemide (LASIX) 20 MG tablet      potassium chloride SA (K-DUR,KLOR-CON) 20 MEQ tablet        Allergies  Allergen Reactions  . Ciprofloxacin Other (See Comments)    diarrhea/yeast infection  . Codeine Other (See Comments)    unknown  . Dilantin [Phenytoin Sodium Extended]   . Levetiracetam Nausea Only    Nausea developed while on this med, worsened when dose increased.  Nausea ceased when med was d/cd.  [place] Penicillins Hives  . Phenytoin Sodium Extended Other (See Comments)    unknown   Follow-up Information    Follow up with MCNEILL,WENDY, MD. Schedule an appointment as soon as possible for a visit in 1 week.   Specialty:  Family Medicine   Contact information:   [place] [id number] [phone]        The results of significant diagnostics from this hospitalization (including imaging, microbiology, ancillary and laboratory) are listed below for reference.    Significant Diagnostic Studies: Dg Chest 1 View  02/15/2015   CLINICAL DATA:  Pain following fall.  Atrial fibrillation.  EXAM: CHEST  1 VIEW  COMPARISON:  September 21, 2013  FINDINGS: Lungs are clear. Heart is upper normal in size with pulmonary vascularity within normal limits. No pneumothorax. No adenopathy.  There is a fracture of the proximal humeral metaphysis on the left which was not present previously.  IMPRESSION: Fracture proximal left humeral metaphysis. The humeral shaft  is displaced medially on the left. There is no edema or consolidation. No pneumothorax.   Electronically Signed   By: Lowella Grip III M.D.   On: 02/15/2015 08:08   Dg Pelvis 1-2 Views  02/15/2015   CLINICAL DATA:  Fall with pelvic pain.  EXAM: PELVIS - 1-2 VIEW  COMPARISON:  11/26/2011  FINDINGS: The bony pelvis appears intact without evidence of fracture or diastasis. Visualized components of a right hip arthroplasty show normal alignment. The tip of the femoral stem is not visualized. The left hip appears unremarkable. No soft tissue abnormalities.  IMPRESSION: No acute fracture identified.   Electronically Signed   By: Aletta Edouard M.D.   On: 02/15/2015 08:07   Ct  Head Wo Contrast  02/15/2015   CLINICAL DATA:  Fall 2 days ago at nursing facility. Pain in the left upper arm.  EXAM: CT HEAD WITHOUT CONTRAST  CT CERVICAL SPINE WITHOUT CONTRAST  TECHNIQUE: Multidetector CT imaging of the head and cervical spine was performed following the standard protocol without intravenous contrast. Multiplanar CT image reconstructions of the cervical spine were also generated.  COMPARISON:  08/13/2013 head CT.  CT cervical spine 01/22/2013.  FINDINGS: CT HEAD FINDINGS  Encephalomalacia in the left frontal lobe with large cystic cavity in the left frontal lobe, stable since prior exam. Extensive chronic small vessel disease throughout the deep white matter. Prior left frontal craniotomy. No hemorrhage or acute infarction. Old right thalamic lacunar infarct. No acute calvarial abnormality. Visualized paranasal sinuses and mastoids clear. Orbital soft tissues unremarkable.  CT CERVICAL SPINE FINDINGS  Normal alignment. Prevertebral soft tissues are normal. No fracture. No epidural or paraspinal hematoma. Mild degenerative disc disease and facet disease throughout the cervical spine.  There is stranding noted within the subcutaneous soft tissues of the left lower neck and visualize left shoulder, presumably hematoma.  Visualized lung apices are clear.  IMPRESSION: Postoperative and chronic changes within the brain. No acute intracranial abnormality.  No acute bony abnormality in the cervical spine. Mild degenerative disc and facet disease.  Stranding within the subcutaneous soft tissues of the left side of the neck and extending into the left shoulder, presumably hematoma. No large measurable fluid collection.   Electronically Signed   By: Rolm Baptise M.D.   On: 02/15/2015 09:07   Ct Cervical Spine Wo Contrast  02/15/2015   CLINICAL DATA:  Fall 2 days ago at nursing facility. Pain in the left upper arm.  EXAM: CT HEAD WITHOUT CONTRAST  CT CERVICAL SPINE WITHOUT CONTRAST  TECHNIQUE: Multidetector CT imaging of the head and cervical spine was performed following the standard protocol without intravenous contrast. Multiplanar CT image reconstructions of the cervical spine were also generated.  COMPARISON:  08/13/2013 head CT.  CT cervical spine 01/22/2013.  FINDINGS: CT HEAD FINDINGS  Encephalomalacia in the left frontal lobe with large cystic cavity in the left frontal lobe, stable since prior exam. Extensive chronic small vessel disease throughout the deep white matter. Prior left frontal craniotomy. No hemorrhage or acute infarction. Old right thalamic lacunar infarct. No acute calvarial abnormality. Visualized paranasal sinuses and mastoids clear. Orbital soft tissues unremarkable.  CT CERVICAL SPINE FINDINGS  Normal alignment. Prevertebral soft tissues are normal. No fracture. No epidural or paraspinal hematoma. Mild degenerative disc disease and facet disease throughout the cervical spine.  There is stranding noted within the subcutaneous soft tissues of the left lower neck and visualize left shoulder, presumably hematoma. Visualized lung apices are clear.  IMPRESSION: Postoperative and chronic changes within the brain. No acute intracranial abnormality.  No acute bony abnormality in the cervical spine. Mild degenerative  disc and facet disease.  Stranding within the subcutaneous soft tissues of the left side of the neck and extending into the left shoulder, presumably hematoma. No large measurable fluid collection.   Electronically Signed   By: Rolm Baptise M.D.   On: 02/15/2015 09:07   Dg Humerus Left  02/15/2015   CLINICAL DATA:  [place], LEFT humerus pain.  EXAM: LEFT HUMERUS - 2+ VIEW  COMPARISON:  None.  FINDINGS: There is a transverse displaced fracture of the humerus across the neck. Slight comminution with a fragment involving the posterior humeral head. No dislocation. Osteopenia.  IMPRESSION: Transverse displaced  fracture across the neck of the humerus. Slight comminution. No dislocation.   Electronically Signed   By: Rolla Flatten M.D.   On: 02/15/2015 08:07    Microbiology: Recent Results (from the past 240 hour(s))  Urine culture     Status: None   Collection Time: 02/17/15 11:16 AM  Result Value Ref Range Status   Specimen Description URINE, CATHETERIZED  Final   Special Requests NONE  Final   Colony Count   Final    >=100,000 COLONIES/ML Performed at [place] Performed at [facility]    Report Status 02/19/2015 FINAL  Final   Organism ID, Bacteria CITROBACTER KOSERI  Final      Susceptibility   Citrobacter koseri - MIC*    CEFAZOLIN <=4 SENSITIVE Sensitive     CEFTRIAXONE <=1 SENSITIVE Sensitive     CIPROFLOXACIN <=0.25 SENSITIVE Sensitive     GENTAMICIN <=1 SENSITIVE Sensitive     LEVOFLOXACIN <=0.12 SENSITIVE Sensitive     NITROFURANTOIN 32 SENSITIVE Sensitive     TOBRAMYCIN <=1 SENSITIVE Sensitive     TRIMETH/SULFA <=20 SENSITIVE Sensitive     PIP/TAZO 16 SENSITIVE Sensitive     * CITROBACTER KOSERI  Nasal culture     Status: None   Collection Time: 02/17/15 10:02 PM  Result Value Ref Range Status   Specimen Description NASOPHARYNGEAL  Final   Special Requests NONE  Final   Culture   Final    NORMAL NASOPHARYNGEAL  FLORA Performed at [facility]    Report Status 02/20/2015 FINAL  Final  MRSA PCR Screening     Status: None   Collection Time: 02/18/15  9:56 AM  Result Value Ref Range Status   MRSA by PCR NEGATIVE NEGATIVE Final    Comment:        The GeneXpert MRSA Assay (FDA approved for NASAL specimens only), is one component of a comprehensive MRSA colonization surveillance program. It is not intended to diagnose MRSA infection nor to guide or monitor treatment for MRSA infections.      Labs: Basic Metabolic Panel:  Recent Labs Lab 02/17/15 1324 02/18/15 0511 02/19/15 0524  NA 150* 145 144  K 3.7 3.3* 3.9  CL 111 109 112*  CO2 28 27 25   GLUCOSE 119* 132* 109*  BUN 25* 26* 27*  CREATININE 1.18* 0.83 0.98  CALCIUM 9.3 8.9 8.6*   Liver Function Tests:  Recent Labs Lab 02/18/15 0511  AST 15  ALT 10*  ALKPHOS 59  BILITOT 0.6  PROT 6.3*  ALBUMIN 3.0*   No results for input(s): LIPASE, AMYLASE in the last 168 hours. No results for input(s): AMMONIA in the last 168 hours. CBC:  Recent Labs Lab 02/17/15 1324 02/18/15 0511 02/19/15 0524  WBC 12.0* 9.9 9.1  NEUTROABS 9.6*  --   --   HGB 12.1 11.2* 10.8*  HCT 37.8 35.7* 34.2*  MCV 103.3* 102.9* 102.7*  PLT 256 229 234   Cardiac Enzymes: No results for input(s): CKTOTAL, CKMB, CKMBINDEX, TROPONINI in the last 168 hours. BNP: BNP (last 3 results) No results for input(s): BNP in the last 8760 hours.  ProBNP (last 3 results) No results for input(s): PROBNP in the last 8760 hours.  CBG: No results for input(s): GLUCAP in the last 168 hours.     SignedHosie Poisson  [facility] 02/22/2015, 9:51 AM

## 2015-02-22 NOTE — Care Management Note (Signed)
Case Management Note  Patient Details  Name: [name] MRN: [id number] Date of Birth: November 22, 1935  Subjective/Objective:                    Action/Plan:   Expected Discharge Date:   (unknown)               Expected Discharge Plan:     In-House Referral:     Discharge planning Services     Post Acute Care Choice:    Choice offered to:     DME Arranged:    DME Agency:     HH Arranged:    [place] Agency:     Status of Service:     Medicare Important Message Given:    Date Medicare IM Given:    Medicare IM give by:    Date Additional Medicare IM Given:    Additional Medicare Important Message give by:     If discussed at [place] of Stay Meetings, dates discussed:  02/22/15  Additional Comments:  Dessa Phi, RN 02/22/2015, 1:34 PM

## 2015-02-22 NOTE — Progress Notes (Signed)
Called report to [facility] and talked with vivian howard rn.

## 2015-02-22 NOTE — Clinical Social Work Placement (Signed)
Patient is set to discharge to [facility] today. Patient & daughter, [name] aware. Discharge packet given to RN, Manuela Schwartz. [facility] called for transport.     Raynaldo Opitz, [facility] Clinical Social Worker cell #: [phone]    CLINICAL SOCIAL WORK PLACEMENT  NOTE  Date:  02/22/2015  Patient Details  Name: [name] MRN: [id number] Date of Birth: [birth year]  Clinical Social Work is seeking post-discharge placement for this patient at the [place] level of care (*CSW will initial, date and re-position this form in  chart as items are completed):  Yes   Patient/family provided with [place] Work Department's list of facilities offering this level of care within the geographic area requested by the patient (or if unable, by the patient's family).  Yes   Patient/family informed of their freedom to choose among providers that offer the needed level of care, that participate in Medicare, Medicaid or managed care program needed by the patient, have an available bed and are willing to accept the patient.  Yes   Patient/family informed of [facility]'s ownership interest in [facility] and [facility], as well as of the fact that they are under no obligation to receive care at these facilities.  PASRR submitted to EDS on 02/22/15     PASRR number received on 02/22/15     Existing PASRR number confirmed on       FL2 transmitted to all facilities in geographic area requested by pt/family on 02/22/15     FL2 transmitted to all facilities within larger geographic area on       Patient informed that his/her managed care company has contracts with or will negotiate with certain facilities, including the following:        Yes   Patient/family informed of bed offers received.  Patient chooses bed at [facility]     Physician recommends and patient chooses bed at      Patient to be transferred to [facility] on  02/22/15.  Patient to be transferred to facility by [facility]     Patient family notified on 02/22/15 of transfer.  Name of family member notified:  patient's daughter, [name] via phone     PHYSICIAN       Additional Comment:    _______________________________________________ Standley Brooking, LCSW 02/22/2015, 2:38 PM

## 2015-02-24 ENCOUNTER — Non-Acute Institutional Stay (SKILLED_NURSING_FACILITY): Payer: Medicare Other | Admitting: Adult Health

## 2015-02-24 ENCOUNTER — Encounter: Payer: Self-pay | Admitting: Adult Health

## 2015-02-24 DIAGNOSIS — R569 Unspecified convulsions: Secondary | ICD-10-CM | POA: Diagnosis not present

## 2015-02-24 DIAGNOSIS — I482 Chronic atrial fibrillation, unspecified: Secondary | ICD-10-CM

## 2015-02-24 DIAGNOSIS — E43 Unspecified severe protein-calorie malnutrition: Secondary | ICD-10-CM | POA: Diagnosis not present

## 2015-02-24 DIAGNOSIS — Z79890 Hormone replacement therapy (postmenopausal): Secondary | ICD-10-CM

## 2015-02-24 DIAGNOSIS — F32A Depression, unspecified: Secondary | ICD-10-CM

## 2015-02-24 DIAGNOSIS — R5381 Other malaise: Secondary | ICD-10-CM

## 2015-02-24 DIAGNOSIS — D638 Anemia in other chronic diseases classified elsewhere: Secondary | ICD-10-CM | POA: Diagnosis not present

## 2015-02-24 DIAGNOSIS — Z8673 Personal history of transient ischemic attack (TIA), and cerebral infarction without residual deficits: Secondary | ICD-10-CM

## 2015-02-24 DIAGNOSIS — N390 Urinary tract infection, site not specified: Secondary | ICD-10-CM

## 2015-02-24 DIAGNOSIS — F329 Major depressive disorder, single episode, unspecified: Secondary | ICD-10-CM

## 2015-02-24 DIAGNOSIS — I10 Essential (primary) hypertension: Secondary | ICD-10-CM | POA: Diagnosis not present

## 2015-02-24 DIAGNOSIS — F419 Anxiety disorder, unspecified: Secondary | ICD-10-CM | POA: Diagnosis not present

## 2015-02-24 DIAGNOSIS — S42202S Unspecified fracture of upper end of left humerus, sequela: Secondary | ICD-10-CM

## 2015-02-24 DIAGNOSIS — S42412A Displaced simple supracondylar fracture without intercondylar fracture of left humerus, initial encounter for closed fracture: Secondary | ICD-10-CM | POA: Insufficient documentation

## 2015-02-24 NOTE — Progress Notes (Signed)
Patient ID: [name], female   DOB: 07-18-1936, 79 y.o.   MRN: [id number]   02/24/2015  Facility:  Nursing Home Location:  [place] Room Number: 1761-6 LEVEL OF CARE:  SNF (31)   Chief Complaint  Patient presents with  . Hospitalization Follow-up    Physical deconditioning, UTI, atrial fibrillation, hypertension, seizure, history of CVA, anxiety, depression, HRT, anemia and protein calorie malnutrition    HISTORY OF PRESENT ILLNESS:  This is a 79 year old female who has been admitted to [facility] on 02/22/15 from [facility]. She has PMH of depression, atrial fibrillation, history of CVA, diverticulosis, seizure, GERD, IBS, and CHF. She leaves in an SNF and was brought to the ED on 5/26 with fever. She was diagnosed with UTI and given Keflex. She has expressive aphasia resulting from history of CVA. She has fallen out of bed @ SNF and sustained a left proximal humerus fracture on 5/24. Husband is at bedside. She has been admitted for a short-term rehabilitation.  PAST MEDICAL HISTORY:  Past Medical History  Diagnosis Date  . Depressive disorder, not elsewhere classified   . Atrial fibrillation   . Unspecified transient cerebral ischemia   . Unspecified cerebral artery occlusion with cerebral infarction   . Unspecified essential hypertension   . Diverticulosis of colon (without mention of hemorrhage)   . Stricture and stenosis of esophagus   . Seizure disorder   . GERD (gastroesophageal reflux disease)   . IBS (irritable bowel syndrome)   . Stroke   . CHF (congestive heart failure)   . Anemia   . Hyperlipidemia   . Arrhythmia   . Hx: UTI (urinary tract infection)     CURRENT MEDICATIONS: Reviewed per MAR/see medication list  Allergies  Allergen Reactions  . Ciprofloxacin Other (See Comments)    diarrhea/yeast infection  . Codeine Other (See Comments)    unknown  . Dilantin [Phenytoin Sodium Extended]   . Levetiracetam  Nausea Only    Nausea developed while on this med, worsened when dose increased.  Nausea ceased when med was d/cd.  Marland Kitchen Penicillins Hives  . Phenytoin Sodium Extended Other (See Comments)    unknown     REVIEW OF SYSTEMS: Unable to obtain due to expressive aphasia  PHYSICAL EXAMINATION  GENERAL: no acute distress, normal body habitus EYES: conjunctivae normal, sclerae normal, normal eye lids NECK: supple, trachea midline, no neck masses, no thyroid tenderness, no thyromegaly LYMPHATICS: no LAN in the neck, no supraclavicular LAN RESPIRATORY: breathing is even & unlabored, BS CTAB CARDIAC: irregular heat rate, no murmur,no extra heart sounds, no edema GI: abdomen soft, normal BS, no masses, no tenderness, no hepatomegaly, no splenomegaly EXTREMITIES: LUE w/ sling and bruised, right hand is contracted, not moving BLE PSYCHIATRIC: affect & behavior appropriate  LABS/RADIOLOGY: Labs reviewed: Basic Metabolic Panel:  Recent Labs  02/17/15 1324 02/18/15 0511 02/19/15 0524  NA 150* 145 144  K 3.7 3.3* 3.9  CL 111 109 112*  CO2 28 27 25   GLUCOSE 119* 132* 109*  BUN 25* 26* 27*  CREATININE 1.18* 0.83 0.98  CALCIUM 9.3 8.9 8.6*   Liver Function Tests:  Recent Labs  02/15/15 0825 02/18/15 0511  AST 19 15  ALT 12* 10*  ALKPHOS 74 59  BILITOT 0.6 0.6  PROT 6.8 6.3*  ALBUMIN 3.4* 3.0*   CBC:  Recent Labs  02/15/15 0825 02/17/15 1324 02/18/15 0511 02/19/15 0524  WBC 9.6 12.0* 9.9 9.1  NEUTROABS 7.9*  9.6*  --   --   HGB 12.5 12.1 11.2* 10.8*  HCT 37.6 37.8 35.7* 34.2*  MCV 98.2 103.3* 102.9* 102.7*  PLT 204 256 229 234     Dg Chest 1 View  02/15/2015   CLINICAL DATA:  Pain following fall.  Atrial fibrillation.  EXAM: CHEST  1 VIEW  COMPARISON:  September 21, 2013  FINDINGS: Lungs are clear. Heart is upper normal in size with pulmonary vascularity within normal limits. No pneumothorax. No adenopathy.  There is a fracture of the proximal humeral metaphysis on the  left which was not present previously.  IMPRESSION: Fracture proximal left humeral metaphysis. The humeral shaft is displaced medially on the left. There is no edema or consolidation. No pneumothorax.   Electronically Signed   By: Lowella Grip III M.D.   On: 02/15/2015 08:08   Dg Pelvis 1-2 Views  02/15/2015   CLINICAL DATA:  Fall with pelvic pain.  EXAM: PELVIS - 1-2 VIEW  COMPARISON:  11/26/2011  FINDINGS: The bony pelvis appears intact without evidence of fracture or diastasis. Visualized components of a right hip arthroplasty show normal alignment. The tip of the femoral stem is not visualized. The left hip appears unremarkable. No soft tissue abnormalities.  IMPRESSION: No acute fracture identified.   Electronically Signed   By: Aletta Edouard M.D.   On: 02/15/2015 08:07   Ct Head Wo Contrast  02/15/2015   CLINICAL DATA:  Fall 2 days ago at nursing facility. Pain in the left upper arm.  EXAM: CT HEAD WITHOUT CONTRAST  CT CERVICAL SPINE WITHOUT CONTRAST  TECHNIQUE: Multidetector CT imaging of the head and cervical spine was performed following the standard protocol without intravenous contrast. Multiplanar CT image reconstructions of the cervical spine were also generated.  COMPARISON:  08/13/2013 head CT.  CT cervical spine 01/22/2013.  FINDINGS: CT HEAD FINDINGS  Encephalomalacia in the left frontal lobe with large cystic cavity in the left frontal lobe, stable since prior exam. Extensive chronic small vessel disease throughout the deep white matter. Prior left frontal craniotomy. No hemorrhage or acute infarction. Old right thalamic lacunar infarct. No acute calvarial abnormality. Visualized paranasal sinuses and mastoids clear. Orbital soft tissues unremarkable.  CT CERVICAL SPINE FINDINGS  Normal alignment. Prevertebral soft tissues are normal. No fracture. No epidural or paraspinal hematoma. Mild degenerative disc disease and facet disease throughout the cervical spine.  There is stranding  noted within the subcutaneous soft tissues of the left lower neck and visualize left shoulder, presumably hematoma. Visualized lung apices are clear.  IMPRESSION: Postoperative and chronic changes within the brain. No acute intracranial abnormality.  No acute bony abnormality in the cervical spine. Mild degenerative disc and facet disease.  Stranding within the subcutaneous soft tissues of the left side of the neck and extending into the left shoulder, presumably hematoma. No large measurable fluid collection.   Electronically Signed   By: Rolm Baptise M.D.   On: 02/15/2015 09:07   Ct Cervical Spine Wo Contrast  02/15/2015   CLINICAL DATA:  Fall 2 days ago at nursing facility. Pain in the left upper arm.  EXAM: CT HEAD WITHOUT CONTRAST  CT CERVICAL SPINE WITHOUT CONTRAST  TECHNIQUE: Multidetector CT imaging of the head and cervical spine was performed following the standard protocol without intravenous contrast. Multiplanar CT image reconstructions of the cervical spine were also generated.  COMPARISON:  08/13/2013 head CT.  CT cervical spine 01/22/2013.  FINDINGS: CT HEAD FINDINGS  Encephalomalacia in the left frontal lobe  with large cystic cavity in the left frontal lobe, stable since prior exam. Extensive chronic small vessel disease throughout the deep white matter. Prior left frontal craniotomy. No hemorrhage or acute infarction. Old right thalamic lacunar infarct. No acute calvarial abnormality. Visualized paranasal sinuses and mastoids clear. Orbital soft tissues unremarkable.  CT CERVICAL SPINE FINDINGS  Normal alignment. Prevertebral soft tissues are normal. No fracture. No epidural or paraspinal hematoma. Mild degenerative disc disease and facet disease throughout the cervical spine.  There is stranding noted within the subcutaneous soft tissues of the left lower neck and visualize left shoulder, presumably hematoma. Visualized lung apices are clear.  IMPRESSION: Postoperative and chronic changes within  the brain. No acute intracranial abnormality.  No acute bony abnormality in the cervical spine. Mild degenerative disc and facet disease.  Stranding within the subcutaneous soft tissues of the left side of the neck and extending into the left shoulder, presumably hematoma. No large measurable fluid collection.   Electronically Signed   By: Rolm Baptise M.D.   On: 02/15/2015 09:07   Dg Humerus Left  02/15/2015   CLINICAL DATA:  [place], LEFT humerus pain.  EXAM: LEFT HUMERUS - 2+ VIEW  COMPARISON:  None.  FINDINGS: There is a transverse displaced fracture of the humerus across the neck. Slight comminution with a fragment involving the posterior humeral head. No dislocation. Osteopenia.  IMPRESSION: Transverse displaced fracture across the neck of the humerus. Slight comminution. No dislocation.   Electronically Signed   By: Rolla Flatten M.D.   On: 02/15/2015 08:07    ASSESSMENT/PLAN:  Physical deconditioning - for rehabilitation UTI - continue Keflex 500 mg 1 capsule by mouth every 12 hours 2 days Atrial fibrillation - rate controlled; continue Coumadin, Cardizem 24 hour 120 mg by mouth daily and metoprolol 100 mg 1 tab by mouth twice a day Hypertension - continue Cardizem 24 hour 120 mg by mouth daily, metoprolol 100 mg 1 tab by mouth twice a day and lisinopril 40 mg 1 tab by mouth daily Seizure - continue Tegretol 200 mg 1 tab by mouth 3 times a day with meals History of CVA - for rehabilitation, PT/OT/ST Anxiety - mood is stable; continue Ativan 0.5 mg 1 tab by mouth daily at bedtime Depression - continue Celexa 20 mg by mouth daily HRT - continue Premarin vaginal cream 1 applicatorful vaginally daily at bedtime Anemia of chronic disease - hemoglobin 10.8; will monitor Protein calorie malnutrition, severe - albumin 3.0; RD consult Left proximal humerus fracture - continue sling on LUE; nonsurgical management per orthopedic   Goals of care:  Short-term rehabilitation   Labs/test ordered:   CBC, BMP, Tegretol level  Spent 50 minutes in patient care.    [place], NP [employer] [phone]

## 2015-02-25 ENCOUNTER — Encounter: Payer: Self-pay | Admitting: Internal Medicine

## 2015-02-25 LAB — POCT INR: INR: 3.3 — AB (ref 0.9–1.1)

## 2015-02-25 NOTE — Progress Notes (Deleted)
Patient ID: RACHELE LAMASTER, female   DOB: [birth year], 79 y.o.   MRN: [id number]     [place]  PCP: Cari Caraway, MD  Code Status: Full Code   Allergies  Allergen Reactions  . Ciprofloxacin Other (See Comments)    diarrhea/yeast infection  . Codeine Other (See Comments)    unknown  . Dilantin [Phenytoin Sodium Extended]   . Levetiracetam Nausea Only    Nausea developed while on this med, worsened when dose increased.  Nausea ceased when med was d/cd.  Marland Kitchen Penicillins Hives  . Phenytoin Sodium Extended Other (See Comments)    unknown    Chief Complaint  Patient presents with  . New Admit To SNF    New Admission      HPI:     year old patient is here for short term rehabilitation post hospital admission from   Review of Systems:  Constitutional: Negative for fever, chills, weight loss, malaise/fatigue and diaphoresis.  HENT: Negative for headache, congestion, nasal discharge, hearing loss, earache, sore throat, difficulty swallowing.   Eyes: Negative for eye pain, blurred vision, double vision and discharge.  Respiratory: Negative for cough, shortness of breath and wheezing.   Cardiovascular: Negative for chest pain, palpitations, leg swelling.  Gastrointestinal: Negative for heartburn, nausea, vomiting, abdominal pain, melena, rectal bleed, diarrhea and constipation. appetite is  Genitourinary: Negative for dysuria, urgency, frequency, hematuria, incontinence, nocturia and flank pain.  Musculoskeletal: Negative for back pain, falls, joint pain and myalgias. assistive device used Skin: Negative for itching, sores and rash.  Neurological: Negative for weakness,dizziness, tingling, focal weakness Psychiatric/Behavioral: Negative for depression, anxiety, insomnia and memory loss.    Past Medical History  Diagnosis Date  . Depressive disorder, not elsewhere classified   . Atrial fibrillation   . Unspecified transient cerebral ischemia   . Unspecified  cerebral artery occlusion with cerebral infarction   . Unspecified essential hypertension   . Diverticulosis of colon (without mention of hemorrhage)   . Stricture and stenosis of esophagus   . Seizure disorder   . GERD (gastroesophageal reflux disease)   . IBS (irritable bowel syndrome)   . Stroke   . CHF (congestive heart failure)   . Anemia   . Hyperlipidemia   . Arrhythmia   . Hx: UTI (urinary tract infection)    Past Surgical History  Procedure Laterality Date  . Hemorrhoid surgery    . Benign braintumor      Removal  . Flexible sigmoidoscopy  09/02/2011    Procedure: FLEXIBLE SIGMOIDOSCOPY;  Surgeon: Zenovia Jarred, MD;  Location: [facility] ENDOSCOPY;  Service: Gastroenterology;  Laterality: N/A;  . Hip arthroplasty  11/26/2011    Procedure: ARTHROPLASTY BIPOLAR HIP;  Surgeon: Alta Corning, MD;  Location: [facility] ORS;  Service: Orthopedics;  Laterality: Right;   Social History:   reports that she has never smoked. She has never used smokeless tobacco. She reports that she does not drink alcohol or use illicit drugs.  Family History  Problem Relation Age of Onset  . Heart disease Mother   . Colon cancer Neg Hx     Medications: Patient's Medications  New Prescriptions   No medications on file  Previous Medications   ACETAMINOPHEN (TYLENOL) 500 MG TABLET    Take 500 mg by mouth every 4 (four) hours as needed (pain).    AMBULATORY NON FORMULARY MEDICATION    Medication Name: Med pass 120 mL by mouth twice daily between meals   ATORVASTATIN (LIPITOR) 40 MG TABLET  Take 40 mg by mouth daily at 6 PM.    CARBAMAZEPINE (TEGRETOL XR) 100 MG 12 HR TABLET    Take 100 mg by mouth 2 (two) times daily.   CHOLECALCIFEROL (VITAMIN D-3 PO)    Take 4,000 Units by mouth daily.   CITALOPRAM (CELEXA) 20 MG TABLET    Take 20 mg by mouth daily.   CONJUGATED ESTROGENS (PREMARIN) VAGINAL CREAM    Place 1 Applicatorful vaginally at bedtime. Apply  small amount on urethra daily   DILTIAZEM (CARDIZEM CD) 120  MG 24 HR CAPSULE    Take 120 mg by mouth daily. Take on an empty stomach   HYDROCORTISONE CREAM 1 %    Apply 1 application topically at bedtime as needed (blood in stool).   LISINOPRIL (PRINIVIL,ZESTRIL) 40 MG TABLET    Take 40 mg by mouth daily.    LOPERAMIDE (IMODIUM A-D) 2 MG TABLET    Take 2 mg by mouth as needed for diarrhea or loose stools.   LORAZEPAM (ATIVAN) 0.5 MG TABLET    Take 1 tablet (0.5 mg total) by mouth at bedtime.   METOPROLOL (LOPRESSOR) 100 MG TABLET    Take 1 tablet (100 mg total) by mouth 2 (two) times daily.   NYSTATIN (MYCOSTATIN/NYSTOP) 100000 UNIT/GM POWD    Apply 1 g topically 3 (three) times daily as needed (itching). Apply to perineum sparingly   ONDANSETRON (ZOFRAN) 8 MG TABLET    Take 8 mg by mouth every 8 (eight) hours as needed for nausea or vomiting.   OXYCODONE-ACETAMINOPHEN (PERCOCET) 5-325 MG PER TABLET    Take 1-2 tablets by mouth every 6 (six) hours as needed for moderate pain or severe pain.   PROTEIN (PROCEL PO)    Take by mouth. 1 scoop two times daily to increase protein   SACCHAROMYCES BOULARDII (FLORASTOR) 250 MG CAPSULE    Take 250 mg by mouth 2 (two) times daily.    VITAMINS A & D (VITAMIN A & D) OINTMENT    Apply 1 application topically 2 (two) times daily.   WARFARIN (COUMADIN) 5 MG TABLET    Take 5 mg by mouth daily.  Modified Medications   No medications on file  Discontinued Medications   CARBAMAZEPINE (TEGRETOL) 200 MG TABLET    Take 100 mg by mouth 3 (three) times daily with meals.    CEPHALEXIN (KEFLEX) 500 MG CAPSULE    Take 1 capsule (500 mg total) by mouth every 12 (twelve) hours.   CHOLECALCIFEROL (VITAMIN D) 2000 UNITS TABLET    Take 4,000 Units by mouth daily.   ENSURE PLUS (ENSURE PLUS) LIQD    Take 237 mLs by mouth 2 (two) times daily between meals.   MALTODEXTRIN-XANTHAN GUM (RESOURCE THICKENUP CLEAR) POWD    As needed   SKIN PROTECTANTS, MISC. (A+D FIRST AID) OINT    Apply 1 application topically 2 (two) times daily.      Physical Exam: *** Filed Vitals:   02/25/15 1542  BP: 165/88  Pulse: 76  Temp: 99.6 F (37.6 C)  TempSrc: Oral  Resp: 16  Height: 5' (1.524 m)  Weight: 146 lb 3.2 oz (66.316 kg)  SpO2: 98%    General- elderly female, well built, in no acute distress Head- normocephalic, atraumatic Ears- left ear normal tympanic membrane and normal external ear canal , right ear normal tympanic membrane and normal external ear canal Nose- normal nasal mucosa, no maxillary or frontal sinus tenderness, no nasal discharge Throat- moist mucus membrane, normal oropharynx,  dentition is  Eyes- PERRLA, EOMI, no pallor, no icterus, no discharge, normal conjunctiva, normal sclera Neck- no cervical lymphadenopathy, no supraclavicular lymphadenopathy, no thyromegaly, no jugular vein distension, no carotid bruit Chest- no chest wall deformities, no chest wall tenderness Breast- normal appearance, no masses or lumps on palpation, normal nipple and areola exam, no axillary lymphadenopathy Cardiovascular- normal s1,s2, no murmurs/ rubs/ gallops, dorsalis pedis and radial pulses, leg edema Respiratory- bilateral clear to auscultation, no wheeze, no rhonchi, no crackles, no use of accessory muscles Abdomen- bowel sounds present, soft, non tender, no organomegaly, no abdominal bruits, no guarding or rigidity, no CVA tenderness Pelvic exam- normal pelvic exam, no adenexal or cervical motion tenderness Musculoskeletal- able to move all 4 extremities, no spinal and paraspinal tenderness, normal back curvature, steady gait, no use of assistive device, normal range of motion Neurological- no focal deficit, normal reflexes, normal muscle strength, normal sensation to fine touch and vibration Skin- warm and dry Nails- hypertrophy, ingrown Psychiatry- alert and oriented to person, place and time, normal mood and affect    Labs reviewed: Basic Metabolic Panel:  Recent Labs  02/17/15 1324 02/18/15 0511  02/19/15 0524  NA 150* 145 144  K 3.7 3.3* 3.9  CL 111 109 112*  CO2 28 27 25   GLUCOSE 119* 132* 109*  BUN 25* 26* 27*  CREATININE 1.18* 0.83 0.98  CALCIUM 9.3 8.9 8.6*   Liver Function Tests:  Recent Labs  02/15/15 0825 02/18/15 0511  AST 19 15  ALT 12* 10*  ALKPHOS 74 59  BILITOT 0.6 0.6  PROT 6.8 6.3*  ALBUMIN 3.4* 3.0*   No results for input(s): LIPASE, AMYLASE in the last 8760 hours. No results for input(s): AMMONIA in the last 8760 hours. CBC:  Recent Labs  02/15/15 0825 02/17/15 1324 02/18/15 0511 02/19/15 0524  WBC 9.6 12.0* 9.9 9.1  NEUTROABS 7.9* 9.6*  --   --   HGB 12.5 12.1 11.2* 10.8*  HCT 37.6 37.8 35.7* 34.2*  MCV 98.2 103.3* 102.9* 102.7*  PLT 204 256 229 234   Cardiac Enzymes: No results for input(s): CKTOTAL, CKMB, CKMBINDEX, TROPONINI in the last 8760 hours. BNP: Invalid input(s): POCBNP CBG: No results for input(s): GLUCAP in the last 8760 hours.  Radiological Exams:   EKG: Independently reviewed. ***  Assessment/Plan No problem-specific assessment & plan notes found for this encounter.     {Rehab current functional status:30803}   Goals of care: ***   Labs/tests ordered  Family/ staff Communication: reviewed care plan with patient and nursing supervisor    Blanchie Serve, MD  [facility] [phone] (Monday-Friday 8 am - 5 pm) [phone] (afterhours)

## 2015-02-28 NOTE — Progress Notes (Signed)
This encounter was created in error - please disregard.

## 2015-03-01 ENCOUNTER — Encounter: Payer: Self-pay | Admitting: Internal Medicine

## 2015-03-01 ENCOUNTER — Non-Acute Institutional Stay (SKILLED_NURSING_FACILITY): Payer: Medicare Other | Admitting: Internal Medicine

## 2015-03-01 DIAGNOSIS — F418 Other specified anxiety disorders: Secondary | ICD-10-CM | POA: Diagnosis not present

## 2015-03-01 DIAGNOSIS — S42202S Unspecified fracture of upper end of left humerus, sequela: Secondary | ICD-10-CM

## 2015-03-01 DIAGNOSIS — Z8673 Personal history of transient ischemic attack (TIA), and cerebral infarction without residual deficits: Secondary | ICD-10-CM

## 2015-03-01 DIAGNOSIS — A498 Other bacterial infections of unspecified site: Secondary | ICD-10-CM | POA: Diagnosis not present

## 2015-03-01 DIAGNOSIS — I10 Essential (primary) hypertension: Secondary | ICD-10-CM | POA: Diagnosis not present

## 2015-03-01 DIAGNOSIS — E46 Unspecified protein-calorie malnutrition: Secondary | ICD-10-CM

## 2015-03-01 DIAGNOSIS — G40909 Epilepsy, unspecified, not intractable, without status epilepticus: Secondary | ICD-10-CM

## 2015-03-01 DIAGNOSIS — D638 Anemia in other chronic diseases classified elsewhere: Secondary | ICD-10-CM | POA: Diagnosis not present

## 2015-03-01 DIAGNOSIS — I482 Chronic atrial fibrillation, unspecified: Secondary | ICD-10-CM

## 2015-03-01 DIAGNOSIS — R5381 Other malaise: Secondary | ICD-10-CM

## 2015-03-01 NOTE — Progress Notes (Signed)
Patient ID: [name], female   DOB: 12/31/1935, 79 y.o.   MRN: [id number]    [place]  PCP: Cari Caraway, MD  Code Status: Full Code   Allergies  Allergen Reactions  . Ciprofloxacin Other (See Comments)    diarrhea/yeast infection  . Codeine Other (See Comments)    unknown  . Dilantin [Phenytoin Sodium Extended]   . Levetiracetam Nausea Only    Nausea developed while on this med, worsened when dose increased.  Nausea ceased when med was d/cd.  Marland Kitchen Penicillins Hives  . Phenytoin Sodium Extended Other (See Comments)    unknown    Chief Complaint  Patient presents with  . New Admit To SNF    New Admission      HPI:  79 year old patient is here for short term rehabilitation post hospital admission from 02/17/15-02/22/15 with fever in setting of citrobacter UTI. She was started on antibiotics. She also had hypernatremia that responded well to iv fluids. She was residing in ALF prior to this. She is under total care in the facility at present. She needs assistance with feeding, will give a one word yes/ no answer with nods. She appears to have flat affect and needs redirection. She nods her head in "yes" when asked about being sad and feeling low. She does not participate fully in HPI and ROS. As per staff, her po intake has been poor. She does not ask for pain meds but there are times when staff are in her room and see her to be in pain.   Review of Systems:  Unable to obtain.  Past Medical History  Diagnosis Date  . Depressive disorder, not elsewhere classified   . Atrial fibrillation   . Unspecified transient cerebral ischemia   . Unspecified cerebral artery occlusion with cerebral infarction   . Unspecified essential hypertension   . Diverticulosis of colon (without mention of hemorrhage)   . Stricture and stenosis of esophagus   . Seizure disorder   . GERD (gastroesophageal reflux disease)   . IBS (irritable bowel syndrome)   . Stroke   . CHF  (congestive heart failure)   . Anemia   . Hyperlipidemia   . Arrhythmia   . Hx: UTI (urinary tract infection)    Past Surgical History  Procedure Laterality Date  . Hemorrhoid surgery    . Benign braintumor      Removal  . Flexible sigmoidoscopy  09/02/2011    Procedure: FLEXIBLE SIGMOIDOSCOPY;  Surgeon: Zenovia Jarred, MD;  Location: [facility] ENDOSCOPY;  Service: Gastroenterology;  Laterality: N/A;  . Hip arthroplasty  11/26/2011    Procedure: ARTHROPLASTY BIPOLAR HIP;  Surgeon: Alta Corning, MD;  Location: [facility] ORS;  Service: Orthopedics;  Laterality: Right;   Social History:   reports that she has never smoked. She has never used smokeless tobacco. She reports that she does not drink alcohol or use illicit drugs.  Family History  Problem Relation Age of Onset  . Heart disease Mother   . Colon cancer Neg Hx     Medications: Patient's Medications  New Prescriptions   No medications on file  Previous Medications   ACETAMINOPHEN (TYLENOL) 500 MG TABLET    Take 500 mg by mouth every 4 (four) hours as needed (pain).    AMBULATORY NON FORMULARY MEDICATION    Medication Name: Med pass 120 mL by mouth twice daily between meals   ATORVASTATIN (LIPITOR) 40 MG TABLET    Take 40 mg by  mouth daily at 6 PM.    CARBAMAZEPINE (TEGRETOL XR) 100 MG 12 HR TABLET    Take 100 mg by mouth 2 (two) times daily.   CHOLECALCIFEROL (VITAMIN D-3 PO)    Take 4,000 Units by mouth daily.   CITALOPRAM (CELEXA) 20 MG TABLET    Take 20 mg by mouth daily.   CONJUGATED ESTROGENS (PREMARIN) VAGINAL CREAM    Place 1 Applicatorful vaginally at bedtime. Apply  small amount on urethra daily   DILTIAZEM (CARDIZEM CD) 120 MG 24 HR CAPSULE    Take 120 mg by mouth daily. Take on an empty stomach   HYDROCORTISONE CREAM 1 %    Apply 1 application topically at bedtime as needed (blood in stool).   LISINOPRIL (PRINIVIL,ZESTRIL) 40 MG TABLET    Take 40 mg by mouth daily.    LOPERAMIDE (IMODIUM A-D) 2 MG TABLET    Take 2 mg by mouth as  needed for diarrhea or loose stools.   LORAZEPAM (ATIVAN) 0.5 MG TABLET    Take 1 tablet (0.5 mg total) by mouth at bedtime.   METOPROLOL (LOPRESSOR) 100 MG TABLET    Take 1 tablet (100 mg total) by mouth 2 (two) times daily.   NYSTATIN (MYCOSTATIN/NYSTOP) 100000 UNIT/GM POWD    Apply 1 g topically 3 (three) times daily as needed (itching). Apply to perineum sparingly   ONDANSETRON (ZOFRAN) 8 MG TABLET    Take 8 mg by mouth every 8 (eight) hours as needed for nausea or vomiting.   OXYCODONE-ACETAMINOPHEN (PERCOCET) 5-325 MG PER TABLET    Take 1-2 tablets by mouth every 6 (six) hours as needed for moderate pain or severe pain.   PROTEIN (PROCEL PO)    Take by mouth. 1 scoop two times daily to increase protein   SACCHAROMYCES BOULARDII (FLORASTOR) 250 MG CAPSULE    Take 250 mg by mouth 2 (two) times daily.    VITAMINS A & D (VITAMIN A & D) OINTMENT    Apply 1 application topically 2 (two) times daily.   WARFARIN (COUMADIN) 5 MG TABLET    Take 5 mg by mouth daily.  Modified Medications   No medications on file  Discontinued Medications   No medications on file     Physical Exam: Filed Vitals:   03/01/15 0842  BP: 165/88  Pulse: 76  Temp: 99.6 F (37.6 C)  TempSrc: Oral  Resp: 16  SpO2: 98%    General- elderly female, frail, in no acute distress Head- normocephalic, atraumatic Throat- moist mucus membrane Eyes- PERRLA, EOMI, no pallor, no icterus, no discharge, normal conjunctiva, normal sclera Neck- no cervical lymphadenopathy Cardiovascular- irregular heart rate, no murmurs, palpable dorsalis pedis and radial pulses, no leg edema Respiratory- bilateral clear to auscultation, no wheeze, no rhonchi, no crackles, no use of accessory muscles Abdomen- bowel sounds present, soft, non tender Musculoskeletal- right hand contracted, left arm in sling, unable to move lower extremities Neurological- aphasic, not following commands Skin- warm and dry Psychiatry- flat affect    Labs  reviewed: Basic Metabolic Panel:  Recent Labs  02/17/15 1324 02/18/15 0511 02/19/15 0524  NA 150* 145 144  K 3.7 3.3* 3.9  CL 111 109 112*  CO2 28 27 25   GLUCOSE 119* 132* 109*  BUN 25* 26* 27*  CREATININE 1.18* 0.83 0.98  CALCIUM 9.3 8.9 8.6*   Liver Function Tests:  Recent Labs  02/15/15 0825 02/18/15 0511  AST 19 15  ALT 12* 10*  ALKPHOS 74 59  BILITOT  0.6 0.6  PROT 6.8 6.3*  ALBUMIN 3.4* 3.0*   No results for input(s): LIPASE, AMYLASE in the last 8760 hours. No results for input(s): AMMONIA in the last 8760 hours. CBC:  Recent Labs  02/15/15 0825 02/17/15 1324 02/18/15 0511 02/19/15 0524  WBC 9.6 12.0* 9.9 9.1  NEUTROABS 7.9* 9.6*  --   --   HGB 12.5 12.1 11.2* 10.8*  HCT 37.6 37.8 35.7* 34.2*  MCV 98.2 103.3* 102.9* 102.7*  PLT 204 256 229 234     Assessment/Plan  Physical deconditioning Will have her work with physical therapy to help with muscle strengthening exercises.fall precautions. Skin care.   Citrobacter UTI Completed course of keflex, currently asymptomatic, monitor  Protein calorie malnutrition To continue assistance with feeding, encourage po intake. Add remeron 15 mg po dialy to help with appetite stimulation. Continue dysphagia diet with thickened liquids. Continue medpass supplement  Depression On celexa 20 mg daily. Add remeron 15 mg daily to help with her mood and reasess  CVA with right weakness Will have patient work with PT as tolerated to regain strength and restore function.  Fall precautions are in place. cotninue bp medication, lipitor  Left proximal humerus fracture  continue sling on LUE. On percocet 5-325 mg 1-2 tab every 6 hours as needed for pain. Will have her on percocet 5-325 mg bid for now and continue prn medication  and monitor.  Atrial fibrillation Rate controlled. Continue cardizem 120 mg daily and metoprolol 100 mg bid for rate control and coumadin for anticoagulation  Hypertension Elevated bp reading.  on metoprolol 100 mg bid and lisinopril 40 mg daily. Check bp q shift and adjust med if readings remains > 150/90  Anemia of chronic disease Monitor h&h, last hb 10.8  Seizure Remains seizure free. continue Tegretol 200 mg tid  Anxiety continue Ativan 0.5 mg daily at bedtime   Goals of care: short term rehabilitation   Labs/tests ordered: cbc, bmp in 1 week  Family/ staff Communication: reviewed care plan with patient and nursing supervisor    Blanchie Serve, MD  [place] [phone] (Monday-Friday 8 am - 5 pm) [phone] (afterhours)

## 2015-04-01 ENCOUNTER — Encounter: Payer: Self-pay | Admitting: Adult Health

## 2015-04-01 ENCOUNTER — Non-Acute Institutional Stay (SKILLED_NURSING_FACILITY): Payer: Medicare Other | Admitting: Adult Health

## 2015-04-01 DIAGNOSIS — E785 Hyperlipidemia, unspecified: Secondary | ICD-10-CM | POA: Diagnosis not present

## 2015-04-01 DIAGNOSIS — I482 Chronic atrial fibrillation, unspecified: Secondary | ICD-10-CM

## 2015-04-01 DIAGNOSIS — G40909 Epilepsy, unspecified, not intractable, without status epilepticus: Secondary | ICD-10-CM

## 2015-04-01 DIAGNOSIS — I10 Essential (primary) hypertension: Secondary | ICD-10-CM | POA: Diagnosis not present

## 2015-04-01 DIAGNOSIS — S42202S Unspecified fracture of upper end of left humerus, sequela: Secondary | ICD-10-CM

## 2015-04-01 DIAGNOSIS — F32A Depression, unspecified: Secondary | ICD-10-CM

## 2015-04-01 DIAGNOSIS — F419 Anxiety disorder, unspecified: Secondary | ICD-10-CM | POA: Diagnosis not present

## 2015-04-01 DIAGNOSIS — R5381 Other malaise: Secondary | ICD-10-CM | POA: Diagnosis not present

## 2015-04-01 DIAGNOSIS — F329 Major depressive disorder, single episode, unspecified: Secondary | ICD-10-CM

## 2015-04-01 DIAGNOSIS — Z8673 Personal history of transient ischemic attack (TIA), and cerebral infarction without residual deficits: Secondary | ICD-10-CM | POA: Diagnosis not present

## 2015-04-01 DIAGNOSIS — E46 Unspecified protein-calorie malnutrition: Secondary | ICD-10-CM

## 2015-04-01 DIAGNOSIS — D638 Anemia in other chronic diseases classified elsewhere: Secondary | ICD-10-CM

## 2015-04-03 NOTE — Progress Notes (Signed)
Patient ID: [name], female   DOB: 08-29-1936, 79 y.o.   MRN: [id number]   04/01/15  Facility:  [facility] Home Location:  [place] Room Number: 3546-5 LEVEL OF CARE:  SNF (31)  Chief Complaint  Patient presents with  . Medical Management of Chronic Issues    Physical deconditioning, atrial fibrillation, hypertension, seizure, history of CVA, anxiety, depression, HRT, anemia, left humerus fracture, hyperlipidemia and protein calorie malnutrition    HISTORY OF PRESENT ILLNESS:  This is a 79 year old female who is being seen for a routine visit. She has been admitted to [facility] on 02/22/15 from [facility]. She has PMH of depression, atrial fibrillation, history of CVA, diverticulosis, seizure, GERD, IBS, and CHF. She leaves in an SNF and was brought to the ED on 5/26 with fever. She was diagnosed with UTI and given Keflex. She has expressive aphasia resulting from history of CVA. She has fallen out of bed @ SNF and sustained a left proximal humerus fracture on 5/24. Her sling has been discontinued recently.  She is currently having short-term rehabilitation.  PAST MEDICAL HISTORY:  Past Medical History  Diagnosis Date  . Depressive disorder, not elsewhere classified   . Atrial fibrillation   . Unspecified transient cerebral ischemia   . Unspecified cerebral artery occlusion with cerebral infarction   . Unspecified essential hypertension   . Diverticulosis of colon (without mention of hemorrhage)   . Stricture and stenosis of esophagus   . Seizure disorder   . GERD (gastroesophageal reflux disease)   . IBS (irritable bowel syndrome)   . Stroke   . CHF (congestive heart failure)   . Anemia   . Hyperlipidemia   . Arrhythmia   . Hx: UTI (urinary tract infection)     CURRENT MEDICATIONS: Reviewed per MAR/see medication list  Allergies  Allergen Reactions  . Ciprofloxacin Other (See Comments)    diarrhea/yeast infection  . Codeine  Other (See Comments)    unknown  . Dilantin [Phenytoin Sodium Extended]   . Levetiracetam Nausea Only    Nausea developed while on this med, worsened when dose increased.  Nausea ceased when med was d/cd.  Marland Kitchen Penicillins Hives  . Phenytoin Sodium Extended Other (See Comments)    unknown     REVIEW OF SYSTEMS: Unable to obtain due to expressive aphasia  PHYSICAL EXAMINATION  GENERAL: no acute distress, normal body habitus NECK: supple, trachea midline, no neck masses, no thyroid tenderness, no thyromegaly LYMPHATICS: no LAN in the neck, no supraclavicular LAN RESPIRATORY: breathing is even & unlabored, BS CTAB CARDIAC: irregular heat rate, no murmur,no extra heart sounds, no edema GI: abdomen soft, normal BS, no masses, no tenderness, no hepatomegaly, no splenomegaly EXTREMITIES:  right hand is contracted, not moving BLE PSYCHIATRIC: affect & behavior appropriate  LABS/RADIOLOGY: Labs reviewed: 03/23/15  total bilirubin 0.3 direct bilirubin 0.1 and direct bilirubin 0.2 alkaline phosphatase 108 SGOT 10 SGPT <8 total protein 5.2 albumin 2.7 03/17/15  urine culture >= 100,000 colonies/ML Citrobacter koseri 03/10/15  carbamazepine 6.7 03/08/15  WBC 7.6 hemoglobin 11.4 hematocrit 33.4 MCV 94.9 platelet 267 sodium 140 potassium 3.9 glucose 72 BUN 18 creatinine 0.92 alkaline phosphatase 133 total bilirubin 0.3 SGOT 11 SGPT 8 total protein 5.3 albumin 2.8 calcium 8.3 Basic Metabolic Panel:  Recent Labs  02/17/15 1324 02/18/15 0511 02/19/15 0524  NA 150* 145 144  K 3.7 3.3* 3.9  CL 111 109 112*  CO2 28 27 25   GLUCOSE 119*  132* 109*  BUN 25* 26* 27*  CREATININE 1.18* 0.83 0.98  CALCIUM 9.3 8.9 8.6*   Liver Function Tests:  Recent Labs  02/15/15 0825 02/18/15 0511  AST 19 15  ALT 12* 10*  ALKPHOS 74 59  BILITOT 0.6 0.6  PROT 6.8 6.3*  ALBUMIN 3.4* 3.0*   CBC:  Recent Labs  02/15/15 0825 02/17/15 1324 02/18/15 0511 02/19/15 0524  WBC 9.6 12.0* 9.9 9.1  NEUTROABS 7.9*  9.6*  --   --   HGB 12.5 12.1 11.2* 10.8*  HCT 37.6 37.8 35.7* 34.2*  MCV 98.2 103.3* 102.9* 102.7*  PLT 204 256 229 234     ASSESSMENT/PLAN:  Physical deconditioning - continue  rehabilitation Atrial fibrillation - rate controlled; continue Coumadin, Cardizem 24 hour 120 mg by mouth daily and metoprolol 100 mg 1 tab by mouth twice a day Hypertension - continue Cardizem 24 hour 120 mg by mouth daily, metoprolol 100 mg 1 tab by mouth twice a day, lisinopril 40 mg 1 tab by mouth daily and Clonidine 0.1 mg 1 tab PO Q 6 hours PRN for SBP>160 Seizure - continue Tegretol 200 mg 1 tab by mouth 3 times a day with meals History of CVA - continue rehabilitation Anxiety - mood is stable; continue Ativan 0.5 mg 1 tab by mouth daily at bedtime Depression - continue Celexa 20 mg by mouth daily and Remeron 15 mg PO Q D HRT - continue Premarin vaginal cream 1 applicatorful vaginally daily at bedtime Anemia of chronic disease - hemoglobin 11.4; stable Protein calorie malnutrition, severe - albumin 2.8; continue supplementation Left proximal humerus fracture - sling was recently discontinued; nonsurgical management per orthopedic; continue Percocet 5/325 mg 1-2 tab PO BID and Percocet 5/325 mg 2 tabs PO Q 6 hours PRN Hyperlipidemia - Lipitor 40 mg 1 tab PO Q evening    Goals of care:  Short-term rehabilitation   [facility], NP [place]

## 2015-04-19 ENCOUNTER — Encounter: Payer: Self-pay | Admitting: Adult Health

## 2015-04-19 ENCOUNTER — Non-Acute Institutional Stay (SKILLED_NURSING_FACILITY): Payer: Medicare Other | Admitting: Adult Health

## 2015-04-19 DIAGNOSIS — Z7901 Long term (current) use of anticoagulants: Secondary | ICD-10-CM

## 2015-04-19 DIAGNOSIS — I482 Chronic atrial fibrillation, unspecified: Secondary | ICD-10-CM

## 2015-04-23 ENCOUNTER — Emergency Department (HOSPITAL_COMMUNITY): Payer: Medicare Other

## 2015-04-23 ENCOUNTER — Inpatient Hospital Stay (HOSPITAL_COMMUNITY): Payer: Medicare Other

## 2015-04-23 ENCOUNTER — Encounter (HOSPITAL_COMMUNITY): Payer: Self-pay | Admitting: Emergency Medicine

## 2015-04-23 ENCOUNTER — Inpatient Hospital Stay (HOSPITAL_COMMUNITY)
Admission: EM | Admit: 2015-04-23 | Discharge: 2015-04-26 | DRG: 305 | Disposition: A | Discharge status: Skilled Nursing Facility | Payer: Medicare Other | Attending: Internal Medicine | Admitting: Internal Medicine

## 2015-04-23 DIAGNOSIS — I6930 Unspecified sequelae of cerebral infarction: Secondary | ICD-10-CM | POA: Diagnosis present

## 2015-04-23 DIAGNOSIS — I69351 Hemiplegia and hemiparesis following cerebral infarction affecting right dominant side: Secondary | ICD-10-CM | POA: Diagnosis not present

## 2015-04-23 DIAGNOSIS — I69320 Aphasia following cerebral infarction: Secondary | ICD-10-CM | POA: Diagnosis not present

## 2015-04-23 DIAGNOSIS — E876 Hypokalemia: Secondary | ICD-10-CM | POA: Diagnosis present

## 2015-04-23 DIAGNOSIS — Z96641 Presence of right artificial hip joint: Secondary | ICD-10-CM | POA: Diagnosis present

## 2015-04-23 DIAGNOSIS — F03C0 Unspecified dementia, severe, without behavioral disturbance, psychotic disturbance, mood disturbance, and anxiety: Secondary | ICD-10-CM | POA: Diagnosis present

## 2015-04-23 DIAGNOSIS — Z8673 Personal history of transient ischemic attack (TIA), and cerebral infarction without residual deficits: Secondary | ICD-10-CM | POA: Diagnosis present

## 2015-04-23 DIAGNOSIS — F0390 Unspecified dementia without behavioral disturbance: Secondary | ICD-10-CM | POA: Diagnosis present

## 2015-04-23 DIAGNOSIS — Z993 Dependence on wheelchair: Secondary | ICD-10-CM | POA: Diagnosis not present

## 2015-04-23 DIAGNOSIS — I10 Essential (primary) hypertension: Principal | ICD-10-CM | POA: Diagnosis present

## 2015-04-23 DIAGNOSIS — Z7901 Long term (current) use of anticoagulants: Secondary | ICD-10-CM | POA: Diagnosis not present

## 2015-04-23 DIAGNOSIS — I480 Paroxysmal atrial fibrillation: Secondary | ICD-10-CM | POA: Diagnosis present

## 2015-04-23 DIAGNOSIS — R569 Unspecified convulsions: Secondary | ICD-10-CM

## 2015-04-23 DIAGNOSIS — E785 Hyperlipidemia, unspecified: Secondary | ICD-10-CM | POA: Diagnosis present

## 2015-04-23 DIAGNOSIS — G40909 Epilepsy, unspecified, not intractable, without status epilepticus: Secondary | ICD-10-CM | POA: Diagnosis present

## 2015-04-23 DIAGNOSIS — I160 Hypertensive urgency: Secondary | ICD-10-CM | POA: Diagnosis present

## 2015-04-23 DIAGNOSIS — Z79899 Other long term (current) drug therapy: Secondary | ICD-10-CM | POA: Diagnosis not present

## 2015-04-23 DIAGNOSIS — F329 Major depressive disorder, single episode, unspecified: Secondary | ICD-10-CM | POA: Diagnosis present

## 2015-04-23 HISTORY — DX: Unspecified convulsions: R56.9

## 2015-04-23 LAB — COMPREHENSIVE METABOLIC PANEL
ALBUMIN: 2.7 g/dL — AB (ref 3.5–5.0)
ALK PHOS: 85 U/L (ref 38–126)
ALT: 7 U/L — AB (ref 14–54)
ANION GAP: 9 (ref 5–15)
AST: 16 U/L (ref 15–41)
BUN: 19 mg/dL (ref 6–20)
CALCIUM: 8.7 mg/dL — AB (ref 8.9–10.3)
CO2: 25 mmol/L (ref 22–32)
Chloride: 109 mmol/L (ref 101–111)
Creatinine, Ser: 1 mg/dL (ref 0.44–1.00)
GFR calc non Af Amer: 53 mL/min — ABNORMAL LOW (ref >60)
Glucose, Bld: 101 mg/dL — ABNORMAL HIGH (ref 65–99)
Potassium: 3.4 mmol/L — ABNORMAL LOW (ref 3.5–5.1)
SODIUM: 143 mmol/L (ref 135–145)
TOTAL PROTEIN: 5.7 g/dL — AB (ref 6.5–8.1)
Total Bilirubin: 0.4 mg/dL (ref 0.3–1.2)

## 2015-04-23 LAB — PROTIME-INR
INR: 1.73 — ABNORMAL HIGH (ref 0.00–1.49)
Prothrombin Time: 20.3 seconds — ABNORMAL HIGH (ref 11.6–15.2)

## 2015-04-23 LAB — CBC WITH DIFFERENTIAL/PLATELET
BASOS PCT: 0 % (ref 0–1)
Basophils Absolute: 0 10*3/uL (ref 0.0–0.1)
EOS ABS: 0.2 10*3/uL (ref 0.0–0.7)
Eosinophils Relative: 2 % (ref 0–5)
HCT: 38.1 % (ref 36.0–46.0)
Hemoglobin: 12.4 g/dL (ref 12.0–15.0)
LYMPHS ABS: 0.7 10*3/uL (ref 0.7–4.0)
Lymphocytes Relative: 6 % — ABNORMAL LOW (ref 12–46)
MCH: 33.3 pg (ref 26.0–34.0)
MCHC: 32.5 g/dL (ref 30.0–36.0)
MCV: 102.4 fL — ABNORMAL HIGH (ref 78.0–100.0)
MONOS PCT: 6 % (ref 3–12)
Monocytes Absolute: 0.7 10*3/uL (ref 0.1–1.0)
NEUTROS PCT: 86 % — AB (ref 43–77)
Neutro Abs: 9.1 10*3/uL — ABNORMAL HIGH (ref 1.7–7.7)
PLATELETS: 230 10*3/uL (ref 150–400)
RBC: 3.72 MIL/uL — ABNORMAL LOW (ref 3.87–5.11)
RDW: 14 % (ref 11.5–15.5)
WBC: 10.6 10*3/uL — ABNORMAL HIGH (ref 4.0–10.5)

## 2015-04-23 LAB — URINALYSIS, ROUTINE W REFLEX MICROSCOPIC
Bilirubin Urine: NEGATIVE
Glucose, UA: NEGATIVE mg/dL
KETONES UR: NEGATIVE mg/dL
Leukocytes, UA: NEGATIVE
NITRITE: NEGATIVE
PH: 7 (ref 5.0–8.0)
Protein, ur: NEGATIVE mg/dL
SPECIFIC GRAVITY, URINE: 1.011 (ref 1.005–1.030)
Urobilinogen, UA: 0.2 mg/dL (ref 0.0–1.0)

## 2015-04-23 LAB — URINE MICROSCOPIC-ADD ON

## 2015-04-23 LAB — CARBAMAZEPINE LEVEL, TOTAL: CARBAMAZEPINE LVL: 6.8 ug/mL (ref 4.0–12.0)

## 2015-04-23 LAB — MRSA PCR SCREENING: MRSA by PCR: NEGATIVE

## 2015-04-23 LAB — CBG MONITORING, ED: GLUCOSE-CAPILLARY: 99 mg/dL (ref 65–99)

## 2015-04-23 MED ORDER — LORAZEPAM 2 MG/ML IJ SOLN
1.0000 mg | Freq: Once | INTRAMUSCULAR | Status: AC
  Administered 2015-04-23: 1 mg via INTRAVENOUS

## 2015-04-23 MED ORDER — WARFARIN SODIUM 5 MG PO TABS
5.0000 mg | ORAL_TABLET | Freq: Once | ORAL | Status: AC
  Administered 2015-04-23: 5 mg via ORAL
  Filled 2015-04-23: qty 1

## 2015-04-23 MED ORDER — LORAZEPAM 2 MG/ML IJ SOLN
INTRAMUSCULAR | Status: AC
  Filled 2015-04-23: qty 1

## 2015-04-23 MED ORDER — OXYCODONE-ACETAMINOPHEN 5-325 MG PO TABS: 1.0000 | ORAL_TABLET | Freq: Four times a day (QID) | ORAL | Status: DC | PRN

## 2015-04-23 MED ORDER — METOPROLOL TARTRATE 100 MG PO TABS
100.0000 mg | ORAL_TABLET | Freq: Two times a day (BID) | ORAL | Status: DC
  Administered 2015-04-23 – 2015-04-26 (×6): 100 mg via ORAL
  Filled 2015-04-23 (×5): qty 1
  Filled 2015-04-23: qty 2
  Filled 2015-04-23: qty 1

## 2015-04-23 MED ORDER — SACCHAROMYCES BOULARDII 250 MG PO CAPS
250.0000 mg | ORAL_CAPSULE | Freq: Two times a day (BID) | ORAL | Status: DC
Start: 2015-04-23 — End: 2015-04-26
  Administered 2015-04-23 – 2015-04-26 (×6): 250 mg via ORAL
  Filled 2015-04-23 (×7): qty 1

## 2015-04-23 MED ORDER — ATORVASTATIN CALCIUM 40 MG PO TABS
40.0000 mg | ORAL_TABLET | Freq: Every day | ORAL | Status: DC
Start: 2015-04-23 — End: 2015-04-26
  Administered 2015-04-23 – 2015-04-25 (×3): 40 mg via ORAL
  Filled 2015-04-23 (×3): qty 1

## 2015-04-23 MED ORDER — WARFARIN - PHARMACIST DOSING INPATIENT
Freq: Every day | Status: DC
Start: 2015-04-23 — End: 2015-04-26
  Administered 2015-04-23 – 2015-04-24 (×2)

## 2015-04-23 MED ORDER — LORAZEPAM 0.5 MG PO TABS
0.5000 mg | ORAL_TABLET | Freq: Every day | ORAL | Status: DC
  Administered 2015-04-23 – 2015-04-25 (×3): 0.5 mg via ORAL
  Filled 2015-04-23 (×3): qty 1

## 2015-04-23 MED ORDER — VALPROATE SODIUM 500 MG/5ML IV SOLN
500.0000 mg | Freq: Once | INTRAVENOUS | Status: AC
  Administered 2015-04-23: 500 mg via INTRAVENOUS
  Filled 2015-04-23: qty 5

## 2015-04-23 MED ORDER — CARBAMAZEPINE 100 MG PO CHEW: 100.0000 mg | CHEWABLE_TABLET | Freq: Once | ORAL | Status: DC

## 2015-04-23 MED ORDER — MIRTAZAPINE 15 MG PO TABS
15.0000 mg | ORAL_TABLET | Freq: Every day | ORAL | Status: DC
  Administered 2015-04-23 – 2015-04-25 (×3): 15 mg via ORAL
  Filled 2015-04-23 (×4): qty 1

## 2015-04-23 MED ORDER — ACETAMINOPHEN 500 MG PO TABS: 500.0000 mg | ORAL_TABLET | ORAL | Status: DC | PRN

## 2015-04-23 MED ORDER — ONDANSETRON HCL 4 MG/2ML IJ SOLN: 4.0000 mg | Freq: Four times a day (QID) | INTRAMUSCULAR | Status: DC | PRN

## 2015-04-23 MED ORDER — CLONIDINE HCL 0.1 MG PO TABS
0.1000 mg | ORAL_TABLET | Freq: Four times a day (QID) | ORAL | Status: DC | PRN
  Filled 2015-04-23: qty 1

## 2015-04-23 MED ORDER — HYDRALAZINE HCL 20 MG/ML IJ SOLN
10.0000 mg | Freq: Once | INTRAMUSCULAR | Status: AC
  Administered 2015-04-23: 10 mg via INTRAVENOUS
  Filled 2015-04-23: qty 1

## 2015-04-23 MED ORDER — POTASSIUM CHLORIDE 10 MEQ/100ML IV SOLN
10.0000 meq | INTRAVENOUS | Status: AC
  Administered 2015-04-23 (×2): 10 meq via INTRAVENOUS
  Filled 2015-04-23 (×2): qty 100

## 2015-04-23 MED ORDER — CITALOPRAM HYDROBROMIDE 10 MG PO TABS
20.0000 mg | ORAL_TABLET | Freq: Every day | ORAL | Status: DC
  Administered 2015-04-24 – 2015-04-26 (×3): 20 mg via ORAL
  Filled 2015-04-23: qty 1
  Filled 2015-04-23: qty 2
  Filled 2015-04-23: qty 1

## 2015-04-23 MED ORDER — VALPROATE SODIUM 500 MG/5ML IV SOLN
500.0000 mg | Freq: Two times a day (BID) | INTRAVENOUS | Status: DC
  Administered 2015-04-24 – 2015-04-25 (×4): 500 mg via INTRAVENOUS
  Filled 2015-04-23 (×6): qty 5

## 2015-04-23 MED ORDER — METOPROLOL TARTRATE 25 MG PO TABS: 100.0000 mg | ORAL_TABLET | Freq: Two times a day (BID) | ORAL | Status: DC

## 2015-04-23 MED ORDER — DILTIAZEM HCL ER COATED BEADS 120 MG PO CP24
120.0000 mg | ORAL_CAPSULE | Freq: Every day | ORAL | Status: DC
  Administered 2015-04-23 – 2015-04-26 (×4): 120 mg via ORAL
  Filled 2015-04-23 (×5): qty 1

## 2015-04-23 MED ORDER — LISINOPRIL 20 MG PO TABS
40.0000 mg | ORAL_TABLET | Freq: Every day | ORAL | Status: DC
  Administered 2015-04-23 – 2015-04-26 (×4): 40 mg via ORAL
  Filled 2015-04-23 (×3): qty 1
  Filled 2015-04-23: qty 2

## 2015-04-23 MED ORDER — HYDRALAZINE HCL 20 MG/ML IJ SOLN
10.0000 mg | INTRAMUSCULAR | Status: DC | PRN
  Administered 2015-04-23 – 2015-04-24 (×3): 10 mg via INTRAVENOUS
  Filled 2015-04-23 (×3): qty 1

## 2015-04-23 MED ORDER — LISINOPRIL 20 MG PO TABS: 40.0000 mg | ORAL_TABLET | Freq: Every day | ORAL | Status: DC

## 2015-04-23 MED ORDER — ONDANSETRON HCL 4 MG PO TABS: 4.0000 mg | ORAL_TABLET | Freq: Four times a day (QID) | ORAL | Status: DC | PRN

## 2015-04-23 MED ORDER — DILTIAZEM HCL ER COATED BEADS 120 MG PO CP24: 120.0000 mg | ORAL_CAPSULE | Freq: Every day | ORAL | Status: DC

## 2015-04-23 NOTE — Progress Notes (Signed)
Initial Nutrition Assessment  DOCUMENTATION CODES:  Not applicable  INTERVENTION:  Magic cup BID with meals, each supplement provides 290 kcal and 9 grams of protein   NUTRITION DIAGNOSIS:  Biting/chewing difficulty related to dysphagia as evidenced by need for Dysphasia lvl 2 diet per SLP  GOAL:  Patient will meet greater than or equal to 90% of their needs  MONITOR:  PO intake, Supplement acceptance, Labs, I & O's  REASON FOR ASSESSMENT:  Consult Assessment of nutrition requirement/status  ASSESSMENT:  79 year old female PMHx seizures, HTN, A. 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  On RD arrival there was no family/friends present.As expected, was unable to obtain much from patient.  Unable to obtain good history of weight/oral intake.  Asked if she likes Ensure/boost to which she said yes. She also said yes when I asked if she was eating okay.   Per EMR documentation, her weight appears to have been stable so far this year.   Diet Order:  DIET DYS 2 Room service appropriate?: Yes; Fluid consistency:: Thin  Skin:  Reviewed, no issues  Last BM:  7/30  Height:  Ht Readings from Last 1 Encounters:  04/23/15 5' (1.524 m)   Weight:  Wt Readings from Last 1 Encounters:  04/23/15 147 lb 4.3 oz (66.8 kg)   Wt Readings from Last 10 Encounters:  04/23/15 147 lb 4.3 oz (66.8 kg)  04/19/15 158 lb 3.2 oz (71.759 kg)  04/01/15 146 lb 6.4 oz (66.407 kg)  02/25/15 146 lb 3.2 oz (66.316 kg)  02/24/15 145 lb 11.2 oz (66.089 kg)  02/22/15 148 lb 12.8 oz (67.495 kg)  11/30/11 154 lb 1.6 oz (69.9 kg)  08/31/11 157 lb 10.1 oz (71.5 kg)  08/01/11 165 lb (74.844 kg)  06/12/11 167 lb 12.8 oz (76.114 kg)   Ideal Body Weight:  45.45 kg  BMI:  Body mass index is 28.76  kg/(m^2).  Estimated Nutritional Needs:  Kcal:  1350-1550 (20-23 kcal/kg) Protein:  55-64 g (1.2-1.4 g/kg IBW) Fluid:  1.4-1.6 liters  EDUCATION NEEDS:  No education needs identified at this time  Burtis Junes RD, LDN Nutrition Pager: [phone] 04/23/2015 3:23 PM

## 2015-04-23 NOTE — Consult Note (Addendum)
NEURO HOSPITALIST CONSULT NOTE   Referring physician: ED Reason for Consult: seizures  HPI:                                                                                                                                          [name] is an 79 y.o. 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 Patient had another GTC seizure in the ED and thus neurology was consulted. Daughter tells me that her mother mental status quickly returned to baseline. At baseline patient is wheelchair bound and non verbal. Patient daughter is at the bedside and indicated that [name] was free of any type of seizure until couple weeks ago when she was informed by the nursing home nurse that she had " a mini seizure". Patient takes tegretol 100 mg TID. Notably, she said that her mother is currently getting treated for UTI and that she had had seizures triggered by UTI's in the past. She has been having difficulty with BP control which often has been above 200 in the nursing home, and pt hypertensive (250/130) with EMS and also in the ED BP 211/115 mmHg, which according to her daughter is not unusual after her seizures. CT brain was personally reviewed and showed no acute abnormality but post op encephalomalacia left frontal region. Serologies demonstrated normal white count, K 3.4, INR 1.73. Carbamazepine level pending. Spoke on the phone with ED attending and recommended loading patient with 1 gram IV Depacon, as she has been intolerant of kepra before.   Past Medical History  Diagnosis Date  . Depressive disorder, not elsewhere classified   . Atrial fibrillation   .  Unspecified transient cerebral ischemia   . Unspecified cerebral artery occlusion with cerebral infarction   . Unspecified essential hypertension   . Diverticulosis of colon (without mention of hemorrhage)   . Stricture and stenosis of esophagus   . Seizure disorder   . GERD (gastroesophageal reflux disease)   . IBS (irritable bowel syndrome)   . Stroke   . CHF (congestive heart failure)   . Anemia   . Hyperlipidemia   . Arrhythmia   . Hx: UTI (urinary tract infection)   . Seizures     Past Surgical History  Procedure Laterality Date  . Hemorrhoid surgery    . Benign braintumor      Removal  . Flexible sigmoidoscopy  09/02/2011    Procedure: FLEXIBLE SIGMOIDOSCOPY;  Surgeon: Zenovia Jarred, MD;  Location: [facility] ENDOSCOPY;  Service: Gastroenterology;  Laterality: N/A;  . Hip arthroplasty  11/26/2011    Procedure: ARTHROPLASTY BIPOLAR HIP;  Surgeon: Alta Corning, MD;  Location: [facility] ORS;  Service: Orthopedics;  Laterality: Right;    Family History  Problem Relation Age of Onset  . Heart disease Mother   . Colon cancer Neg Hx     Family History: no epilepsy, brain tumors, or brain aneurysm   Social History:  reports that she has never smoked. She has never used smokeless tobacco. She reports that she does not drink alcohol or use illicit drugs.  Allergies  Allergen Reactions  . Ciprofloxacin Other (See Comments)    diarrhea/yeast infection  . Codeine Other (See Comments)    unknown  . Dilantin [Phenytoin Sodium Extended]   . Levetiracetam Nausea Only    Nausea developed while on this med, worsened when dose increased.  Nausea ceased when med was d/cd.  Marland Kitchen Penicillins Hives  . Phenytoin Sodium Extended Other (See Comments)    unknown    MEDICATIONS:                                                                                                                     I have reviewed the patient's current medications.   ROS: unable to obtain due to mental status.                                                                                                                                       History obtained from chart review and daughter   Physical exam: pleasant female in no apparent distress. Blood pressure 161/88, pulse 90, temperature 98.7 F (37.1 C), temperature source Oral, resp. rate 19, SpO2 99 %. Head: normocephalic. Neck: supple, no bruits, no JVD. Cardiac: no murmurs. Lungs: clear. Abdomen: soft, no tender, no mass. Extremities: no edema. Skin: no rash Neurologic Examination:  General: Mental Status: Alert and awake, non verbal. Can mimic but is not able to follow commands. Cranial Nerves: II: Discs flat bilaterally; Visual fields grossly normal, pupils equal, round, reactive to light and accommodation III,IV, VI: ptosis not present, extra-ocular motions intact bilaterally V,VII: smile symmetric, facial light touch sensation normal bilaterally VIII: hearing normal bilaterally IX,X: uvula rises symmetrically XI: bilateral shoulder shrug XII: midline tongue extension without atrophy or fasciculations  Motor: Residual right hemiparesis. Tone modestly increased in the right. Sensory: Pinprick and light touch intact throughout, bilaterally Deep Tendon Reflexes:  Brisker in the right compared to the left. Plantars: Right: downgoing   Left: downgoing Cerebellar: Unable to test because she is not able to follow commands. Gait:  Patient is wheelchair bound at baseline CV: pulses palpable throughout    Lab Results  Component Value Date/Time   CHOL 182 03/08/2011 11:27 PM    Results for orders placed or performed during the hospital encounter of 04/23/15 (from the past 48 hour(s))  Comprehensive metabolic panel     Status: Abnormal   Collection Time: 04/23/15  7:52 AM  Result Value Ref Range   Sodium 143 135 - 145 mmol/L   Potassium 3.4 (L)  3.5 - 5.1 mmol/L   Chloride 109 101 - 111 mmol/L   CO2 25 22 - 32 mmol/L   Glucose, Bld 101 (H) 65 - 99 mg/dL   BUN 19 6 - 20 mg/dL   Creatinine, Ser 1.00 0.44 - 1.00 mg/dL   Calcium 8.7 (L) 8.9 - 10.3 mg/dL   Total Protein 5.7 (L) 6.5 - 8.1 g/dL   Albumin 2.7 (L) 3.5 - 5.0 g/dL   AST 16 15 - 41 U/L   ALT 7 (L) 14 - 54 U/L   Alkaline Phosphatase 85 38 - 126 U/L   Total Bilirubin 0.4 0.3 - 1.2 mg/dL   GFR calc non Af Amer 53 (L) >60 mL/min   GFR calc Af Amer >60 >60 mL/min    Comment: (NOTE) The eGFR has been calculated using the CKD EPI equation. This calculation has not been validated in all clinical situations. eGFR's persistently <60 mL/min signify possible Chronic Kidney Disease.    Anion gap 9 5 - 15  CBC with Differential     Status: Abnormal   Collection Time: 04/23/15  7:52 AM  Result Value Ref Range   WBC 10.6 (H) 4.0 - 10.5 K/uL   RBC 3.72 (L) 3.87 - 5.11 MIL/uL   Hemoglobin 12.4 12.0 - 15.0 g/dL   HCT 38.1 36.0 - 46.0 %   MCV 102.4 (H) 78.0 - 100.0 fL   MCH 33.3 26.0 - 34.0 pg   MCHC 32.5 30.0 - 36.0 g/dL   RDW 14.0 11.5 - 15.5 %   Platelets 230 150 - 400 K/uL   Neutrophils Relative % 86 (H) 43 - 77 %   Neutro Abs 9.1 (H) 1.7 - 7.7 K/uL   Lymphocytes Relative 6 (L) 12 - 46 %   Lymphs Abs 0.7 0.7 - 4.0 K/uL   Monocytes Relative 6 3 - 12 %   Monocytes Absolute 0.7 0.1 - 1.0 K/uL   Eosinophils Relative 2 0 - 5 %   Eosinophils Absolute 0.2 0.0 - 0.7 K/uL   Basophils Relative 0 0 - 1 %   Basophils Absolute 0.0 0.0 - 0.1 K/uL  Protime-INR     Status: Abnormal   Collection Time: 04/23/15  7:52 AM  Result Value Ref Range   Prothrombin Time 20.3 (H)  11.6 - 15.2 seconds   INR 1.73 (H) 0.00 - 1.49  Urinalysis, Routine w reflex microscopic (not at [facility])     Status: Abnormal   Collection Time: 04/23/15  8:49 AM  Result Value Ref Range   Color, Urine YELLOW YELLOW   APPearance CLEAR CLEAR   Specific Gravity, Urine 1.011 1.005 - 1.030   pH 7.0 5.0 - 8.0   Glucose,  UA NEGATIVE NEGATIVE mg/dL   Hgb urine dipstick TRACE (A) NEGATIVE   Bilirubin Urine NEGATIVE NEGATIVE   Ketones, ur NEGATIVE NEGATIVE mg/dL   Protein, ur NEGATIVE NEGATIVE mg/dL   Urobilinogen, UA 0.2 0.0 - 1.0 mg/dL   Nitrite NEGATIVE NEGATIVE   Leukocytes, UA NEGATIVE NEGATIVE  Urine microscopic-add on     Status: Abnormal   Collection Time: 04/23/15  8:49 AM  Result Value Ref Range   Squamous Epithelial / LPF FEW (A) RARE   WBC, UA 0-2 <3 WBC/hpf   RBC / HPF 0-2 <3 RBC/hpf   Bacteria, UA RARE RARE  CBG monitoring, ED     Status: None   Collection Time: 04/23/15  9:44 AM  Result Value Ref Range   Glucose-Capillary 99 65 - 99 mg/dL    Ct Head Wo Contrast  04/23/2015   CLINICAL DATA:  History of seizure and stroke  EXAM: CT HEAD WITHOUT CONTRAST  TECHNIQUE: Contiguous axial images were obtained from the base of the skull through the vertex without intravenous contrast.  COMPARISON:  02/15/2015; 08/13/2013; brain MRI - 08/13/2013  FINDINGS: Stool sequela of left frontal craniotomy with similar appearance of resection cavity within the left frontal lobe.  Similar findings of advanced atrophy with sulcal prominence and centralized volume loss with commensurate ex vacuo dilatation of the ventricular system. Unchanged old infarct within the right thalamus (image 12, series 201). Extensive periventricular hypodensities compatible microvascular ischemic disease. Given background parenchymal abnormalities, there is no CT evidence of superimposed acute large territory infarct. No intraparenchymal extra-axial mass or hemorrhage. Unchanged size configuration of the ventricles and basilar cisterns. No midline shift.  Limited visualization the paranasal sinuses and mastoid air cells appear normal. No air-fluid levels. Regional soft tissues appear normal. No displaced calvarial fracture.  IMPRESSION: Similar postoperative and chronic changes of the brain without definitive acute intracranial process.    Electronically Signed   By: Sandi Mariscal M.D.   On: 04/23/2015 09:21   Dg Chest Port 1 View  04/23/2015   CLINICAL DATA:  Seizures this morning and altered mental status.  EXAM: PORTABLE CHEST - 1 VIEW  COMPARISON:  02/15/2015 and prior chest radiographs  FINDINGS: The cardiomediastinal silhouette is unremarkable.  There is no evidence of focal airspace disease, pulmonary edema, suspicious pulmonary nodule/mass, pleural effusion, or pneumothorax. No acute bony abnormalities are identified.  IMPRESSION: No active disease.   Electronically Signed   By: Margarette Canada M.D.   On: 04/23/2015 11:35   Assessment/Plan: 79 y/o with history of remote symptomatic GTC epilepsy, now with repetitive seizures that can certainly be related to UTI or hypertensive urgency or PRES. She is sub-therapeutic on coumadin and can not exclude acute post stroke seizures. Back to baseline. Carbamazepine level pending. Recommend: 1) Load with IV 1 gram Depacon (DONE) 2) Continue IV Depacon 500 mg BID starting tomorrow. 3) Check VPA level in am. 4)Carbamazepine level pending, adjust accordingly if neccessary.  5) MRI brain r/o new stroke or PRES 6) Adjust subtherapeutic INR 7) No need for EEG at this time Will follow up.  Donzetta Kohut  Camilo, MD 04/23/2015, 11:44 AM

## 2015-04-23 NOTE — ED Notes (Signed)
MD aware of pt's BP.  

## 2015-04-23 NOTE — Evaluation (Signed)
Clinical/Bedside Swallow Evaluation Patient Details  Name: [name] MRN: [id number] Date of Birth: 12-21-1935  Today's Date: 04/23/2015 Time: SLP Start Time (ACUTE ONLY): 1310 SLP Stop Time (ACUTE ONLY): 1323 SLP Time Calculation (min) (ACUTE ONLY): 13 min  Past Medical History:  Past Medical History  Diagnosis Date  . Depressive disorder, not elsewhere classified   . Atrial fibrillation   . Unspecified transient cerebral ischemia   . Unspecified cerebral artery occlusion with cerebral infarction   . Unspecified essential hypertension   . Diverticulosis of colon (without mention of hemorrhage)   . Stricture and stenosis of esophagus   . Seizure disorder   . GERD (gastroesophageal reflux disease)   . IBS (irritable bowel syndrome)   . Stroke   . CHF (congestive heart failure)   . Anemia   . Hyperlipidemia   . Arrhythmia   . Hx: UTI (urinary tract infection)   . Seizures    Past Surgical History:  Past Surgical History  Procedure Laterality Date  . Hemorrhoid surgery    . Benign braintumor      Removal  . Flexible sigmoidoscopy  09/02/2011    Procedure: FLEXIBLE SIGMOIDOSCOPY;  Surgeon: Zenovia Jarred, MD;  Location: [facility] ENDOSCOPY;  Service: Gastroenterology;  Laterality: N/A;  . Hip arthroplasty  11/26/2011    Procedure: ARTHROPLASTY BIPOLAR HIP;  Surgeon: Alta Corning, MD;  Location: [facility] ORS;  Service: Orthopedics;  Laterality: Right;   HPI:  Patient is 79 year old female with known history of seizures, hypertension, A. 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 She is a resident of skilled nursing facility and was brought to [facility] emergency department via EMS after an episode of witnessed generalized seizure at the facility. Per emergency room doctor, patient had another generalized tonic-clonic seizure while in  emergency department. Chest x ray showed no active disease. History of dysphagia with last diet nectar thick liquids and dysphagia 2 (chopped) consistencies.    Assessment / Plan / Recommendation Clinical Impression  Pts swallow appearing consistent with previous hopitalizations and ST interventions. Poor airway protection noted with thin liquids characterized by immediate coughing and throat clearing. No signs or symptoms of aspiration with nectar thick liquids except with use of a straw. Mastication reduced secondary to poor condition of dentition. Recommend dysphagia 2 ( chopped) diet with nectar thick liquids. NO straw, medicines whole with puree. Full supervision with meals, and assistance with self feeding. ST to follow up for diet tolerance and possible diet upgrade.     Aspiration Risk  Moderate    Diet Recommendation Dysphagia 2 (Fine chop);Nectar   Medication Administration: Whole meds with puree Compensations: Minimize environmental distractions;Slow rate;Small sips/bites;Follow solids with liquid    Other  Recommendations Oral Care Recommendations: Oral care BID Other Recommendations: Order thickener from pharmacy   Follow Up Recommendations       Frequency and Duration min 2x/week  2 weeks   Pertinent Vitals/Pain     SLP Swallow Goals     Swallow Study Prior Functional Status       General Date of Onset: 04/23/15 Other Pertinent Information: Patient is 79 year old female with known history of seizures, hypertension, A. 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 She is a  resident of skilled nursing facility and was brought to [facility] emergency department via EMS after an episode of witnessed generalized seizure at the facility. Per emergency room doctor, patient had another generalized tonic-clonic seizure while in  emergency department. Chest x ray showed no active disease. History of dysphagia with last diet nectar thick liquids and dysphagia 2 (chopped) consistencies.  Type of Study: Bedside swallow evaluation Diet Prior to this Study: NPO Temperature Spikes Noted: No Respiratory Status: Room air History of Recent Intubation: No Behavior/Cognition: Alert;Cooperative Oral Cavity - Dentition: Poor condition;Missing dentition Self-Feeding Abilities: Needs assist Patient Positioning: Upright in bed Baseline Vocal Quality: Normal Volitional Cough: Cognitively unable to elicit Volitional Swallow: Unable to elicit    Oral/Motor/Sensory Function Overall Oral Motor/Sensory Function: Appears within functional limits for tasks assessed   Ice Chips Ice chips: Impaired Presentation: Spoon Oral Phase Impairments: Impaired mastication Oral Phase Functional Implications: Prolonged oral transit;Oral residue Pharyngeal Phase Impairments: Suspected delayed Swallow;Throat Clearing - Immediate   Thin Liquid Thin Liquid: Impaired Presentation: Spoon;Cup Oral Phase Impairments: Reduced lingual movement/coordination Oral Phase Functional Implications: Prolonged oral transit Pharyngeal  Phase Impairments: Suspected delayed Swallow;Throat Clearing - Immediate;Cough - Immediate;Multiple swallows    Nectar Thick Nectar Thick Liquid: Impaired Presentation: Cup;Straw (only impaired with use of straw)   Honey Thick Honey Thick Liquid: Not tested   Puree Puree: Within functional limits   Solid   GO    Solid: Impaired Oral Phase Impairments: Impaired mastication;Impaired anterior to posterior transit Oral Phase Functional Implications: Oral residue Pharyngeal Phase Impairments: Multiple swallows      Arvil Chaco MA, CCC-SLP Acute Care Speech Language Pathologist    Arvil Chaco E 04/23/2015,1:36 PM

## 2015-04-23 NOTE — ED Provider Notes (Signed)
CSN: [id number]     Arrival date & time 04/23/15  1700 History   First MD Initiated Contact with Patient 04/23/15 [phone]     Chief Complaint  Patient presents with  . Seizures     (Consider location/radiation/quality/duration/timing/severity/associated sxs/prior Treatment) HPI  79 year old female with a history of CHF, A. fib, seizures, and frequent UTIs presents with 2 seizures this morning. Patient lives in skilled nursing and they noticed the 2 seizures this morning. Patient is currently on Tegretol for previous history of seizures. The daughter states that she had a "mini seizure" one month ago but otherwise has not had a seizure in over one year. Typically her seizures come on whenever she has a UTI. She has also been more hypertensive than normal with her systolic blood pressure in the 200s and that also typically occurs after a seizure. The daughter states that the patient has difficulty control blood pressure and typically her normal blood pressure these days is a systolic of 449. Patient has not had any complaints but is difficult to assess due to the dementia. The daughter does not feel like the patient is any different than normal. The patient is currently being treated for UTI with IV Rocephin at the facility. She has a history of difficult to treat UTIs per the daughter.  Past Medical History  Diagnosis Date  . Depressive disorder, not elsewhere classified   . Atrial fibrillation   . Unspecified transient cerebral ischemia   . Unspecified cerebral artery occlusion with cerebral infarction   . Unspecified essential hypertension   . Diverticulosis of colon (without mention of hemorrhage)   . Stricture and stenosis of esophagus   . Seizure disorder   . GERD (gastroesophageal reflux disease)   . IBS (irritable bowel syndrome)   . Stroke   . CHF (congestive heart failure)   . Anemia   . Hyperlipidemia   . Arrhythmia   . Hx: UTI (urinary tract infection)   . Seizures    Past  Surgical History  Procedure Laterality Date  . Hemorrhoid surgery    . Benign braintumor      Removal  . Flexible sigmoidoscopy  09/02/2011    Procedure: FLEXIBLE SIGMOIDOSCOPY;  Surgeon: Zenovia Jarred, MD;  Location: [facility] ENDOSCOPY;  Service: Gastroenterology;  Laterality: N/A;  . Hip arthroplasty  11/26/2011    Procedure: ARTHROPLASTY BIPOLAR HIP;  Surgeon: Alta Corning, MD;  Location: [facility] ORS;  Service: Orthopedics;  Laterality: Right;   Family History  Problem Relation Age of Onset  . Heart disease Mother   . Colon cancer Neg Hx    History  Substance Use Topics  . Smoking status: Never Smoker   . Smokeless tobacco: Never Used  . Alcohol Use: No   OB History    No data available     Review of Systems  Unable to perform ROS: Dementia      Allergies  Ciprofloxacin; Codeine; Dilantin; Levetiracetam; Penicillins; and Phenytoin sodium extended  Home Medications   Prior to Admission medications   Medication Sig Start Date End Date Taking? Authorizing Provider  acetaminophen (TYLENOL) 500 MG tablet Take 500 mg by mouth every 4 (four) hours as needed (pain).     Historical Provider, MD  AMBULATORY NON FORMULARY MEDICATION Medication Name: Med pass 120 mL by mouth twice daily between meals    Historical Provider, MD  atorvastatin (LIPITOR) 40 MG tablet Take 40 mg by mouth daily at 6 PM.     Historical Provider, MD  carbamazepine (  TEGRETOL XR) 100 MG 12 hr tablet Take 100 mg by mouth 2 (two) times daily.    Historical Provider, MD  Cholecalciferol (VITAMIN D-3 PO) Take 4,000 Units by mouth daily.    Historical Provider, MD  citalopram (CELEXA) 20 MG tablet Take 20 mg by mouth daily.    Historical Provider, MD  conjugated estrogens (PREMARIN) vaginal cream Place 1 Applicatorful vaginally at bedtime. Apply  small amount on urethra daily    Historical Provider, MD  diltiazem (CARDIZEM CD) 120 MG 24 hr capsule Take 120 mg by mouth daily. Take on an empty stomach 08/11/13   Historical  Provider, MD  hydrocortisone cream 1 % Apply 1 application topically at bedtime as needed (blood in stool).    Historical Provider, MD  lisinopril (PRINIVIL,ZESTRIL) 40 MG tablet Take 40 mg by mouth daily.     Historical Provider, MD  loperamide (IMODIUM A-D) 2 MG tablet Take 2 mg by mouth as needed for diarrhea or loose stools.    Historical Provider, MD  LORazepam (ATIVAN) 0.5 MG tablet Take 1 tablet (0.5 mg total) by mouth at bedtime. 02/22/15   Hosie Poisson, MD  metoprolol (LOPRESSOR) 100 MG tablet Take 1 tablet (100 mg total) by mouth 2 (two) times daily. 02/22/15   Hosie Poisson, MD  nystatin (MYCOSTATIN/NYSTOP) 100000 UNIT/GM POWD Apply 1 g topically 3 (three) times daily as needed (itching). Apply to perineum sparingly    Historical Provider, MD  ondansetron (ZOFRAN) 8 MG tablet Take 8 mg by mouth every 8 (eight) hours as needed for nausea or vomiting.    Historical Provider, MD  oxyCODONE-acetaminophen (PERCOCET) 5-325 MG per tablet Take 1-2 tablets by mouth every 6 (six) hours as needed for moderate pain or severe pain. 02/22/15   Hosie Poisson, MD  Protein (PROCEL PO) Take by mouth. 1 scoop two times daily to increase protein    Historical Provider, MD  saccharomyces boulardii (FLORASTOR) 250 MG capsule Take 250 mg by mouth 2 (two) times daily.     Historical Provider, MD  Vitamins A & D (VITAMIN A & D) ointment Apply 1 application topically 2 (two) times daily.    Historical Provider, MD  warfarin (COUMADIN) 5 MG tablet Take 5 mg by mouth daily.    Historical Provider, MD   BP 211/115 mmHg  Pulse 72  Temp(Src) 98.7 F (37.1 C) (Oral)  Resp 15  SpO2 96% Physical Exam  Constitutional: She appears well-developed and well-nourished.  HENT:  Head: Normocephalic and atraumatic.  Right Ear: External ear normal.  Left Ear: External ear normal.  Nose: Nose normal.  Eyes: Pupils are equal, round, and reactive to light. Right eye exhibits no discharge. Left eye exhibits no discharge.  Neck:  Neck supple.  Cardiovascular: Normal rate and regular rhythm.   Murmur heard. Pulmonary/Chest: Effort normal and breath sounds normal.  Abdominal: Soft. There is no tenderness.  Neurological: She is alert. She is disoriented.  Moves all 4 extremities with equal strength but does not focus well during specific strength and neuro testing  Skin: Skin is warm and dry.  Nursing note and vitals reviewed.   ED Course  Procedures (including critical care time) Labs Review Labs Reviewed  COMPREHENSIVE METABOLIC PANEL - Abnormal; Notable for the following:    Potassium 3.4 (*)    Glucose, Bld 101 (*)    Calcium 8.7 (*)    Total Protein 5.7 (*)    Albumin 2.7 (*)    ALT 7 (*)    GFR  calc non Af Amer 53 (*)    All other components within normal limits  CBC WITH DIFFERENTIAL/PLATELET - Abnormal; Notable for the following:    WBC 10.6 (*)    RBC 3.72 (*)    MCV 102.4 (*)    Neutrophils Relative % 86 (*)    Neutro Abs 9.1 (*)    Lymphocytes Relative 6 (*)    All other components within normal limits  URINALYSIS, ROUTINE W REFLEX MICROSCOPIC (NOT AT [facility]) - Abnormal; Notable for the following:    Hgb urine dipstick TRACE (*)    All other components within normal limits  PROTIME-INR - Abnormal; Notable for the following:    Prothrombin Time 20.3 (*)    INR 1.73 (*)    All other components within normal limits  URINE MICROSCOPIC-ADD ON - Abnormal; Notable for the following:    Squamous Epithelial / LPF FEW (*)    All other components within normal limits  MRSA PCR SCREENING  URINE CULTURE  CARBAMAZEPINE LEVEL, TOTAL  CBC  BASIC METABOLIC PANEL  PROTIME-INR  CBG MONITORING, ED    Imaging Review Ct Head Wo Contrast  04/23/2015   CLINICAL DATA:  History of seizure and stroke  EXAM: CT HEAD WITHOUT CONTRAST  TECHNIQUE: Contiguous axial images were obtained from the base of the skull through the vertex without intravenous contrast.  COMPARISON:  02/15/2015; 08/13/2013; brain MRI -  08/13/2013  FINDINGS: Stool sequela of left frontal craniotomy with similar appearance of resection cavity within the left frontal lobe.  Similar findings of advanced atrophy with sulcal prominence and centralized volume loss with commensurate ex vacuo dilatation of the ventricular system. Unchanged old infarct within the right thalamus (image 12, series 201). Extensive periventricular hypodensities compatible microvascular ischemic disease. Given background parenchymal abnormalities, there is no CT evidence of superimposed acute large territory infarct. No intraparenchymal extra-axial mass or hemorrhage. Unchanged size configuration of the ventricles and basilar cisterns. No midline shift.  Limited visualization the paranasal sinuses and mastoid air cells appear normal. No air-fluid levels. Regional soft tissues appear normal. No displaced calvarial fracture.  IMPRESSION: Similar postoperative and chronic changes of the brain without definitive acute intracranial process.   Electronically Signed   By: Sandi Mariscal M.D.   On: 04/23/2015 09:21   Mr Brain Wo Contrast  04/23/2015   CLINICAL DATA:  Witnessed seizure. History of seizures. Personal history of tumor resection and left frontal lobectomy.  EXAM: MRI HEAD WITHOUT CONTRAST  TECHNIQUE: Multiplanar, multiecho pulse sequences of the brain and surrounding structures were obtained without intravenous contrast.  COMPARISON:  CT head from the same day.  MRI brain 08/13/2013.  FINDINGS: No acute infarct is present. Cystic dilation of the left lateral ventricle following resection of a left frontal lobe lesion is stable. Ex vacuo dilation of the posterior aspect of the left lateral ventricle is stable is well. Remote lacunar infarcts in the basal ganglia are stable. Diffuse white matter changes are again seen.  No acute infarct, hemorrhage, or mass lesion is present. The left internal carotid artery is occluded at the skull base without significant change. The right  internal carotid artery is patent. The posterior circulation is patent.  Globes orbits are intact. The paranasal sinuses and mastoid air cells are clear.  IMPRESSION: 1. Stable postoperative changes of the left frontal lobe. 2. Stable remote lacunar infarcts of the basal ganglia. 3. 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 4. Stable diffuse white matter disease. 5. No acute or  focal lesion to explain seizures.   Electronically Signed   By: San Morelle M.D.   On: 04/23/2015 16:55   Dg Chest Port 1 View  04/23/2015   CLINICAL DATA:  Seizures this morning and altered mental status.  EXAM: PORTABLE CHEST - 1 VIEW  COMPARISON:  02/15/2015 and prior chest radiographs  FINDINGS: The cardiomediastinal silhouette is unremarkable.  There is no evidence of focal airspace disease, pulmonary edema, suspicious pulmonary nodule/mass, pleural effusion, or pneumothorax. No acute bony abnormalities are identified.  IMPRESSION: No active disease.   Electronically Signed   By: Margarette Canada M.D.   On: 04/23/2015 11:35     EKG Interpretation None      MDM   Final diagnoses:  Seizures    Patient has normal neuro exam (for her) and found to be quite hypertensive (although daughter states this has been a problem for a month). No evidence of infection, unclear what the stimulus of seizures are. Workup unremarkable here, including no UTI. Had a brief (less than 30 seconds) generalized seizure. Will need admission for BP control and seizure control. Admit to stepdown with hospitalist. Neuro consulted, recommends depakote IV given her allergies.     Sherwood Gambler, MD 04/23/15 1946

## 2015-04-23 NOTE — Care Management Note (Addendum)
Case Management Note  Patient Details  Name: CARLEN REBUCK MRN: [id number] Date of Birth: 1936/05/13  Subjective/Objective:         From [facility], admitted with seizures.  History of seizures, hypertension, A. 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     Action/Plan:  Return to SNF when medically stable. CM to f/u with d/c needs. Expected Discharge Date:                  Expected Discharge Plan:    In-House Referral:  Clinical Social Work  Discharge planning Services  CM Consult  Post Acute Care Choice:    Choice offered to:     DME Arranged:    DME Agency:     HH Arranged:    HH Agency:     Status of Service:  In process, will continue to follow  Medicare Important Message Given:    Date Medicare IM Given:    Medicare IM give by:    Date Additional Medicare IM Given:    Additional Medicare Important Message give by:     If discussed at [place] of Stay Meetings, dates discussed:    Additional Comments: Euva Rundell (Daughter) [phone], [place] [facility])  [phone]  Whitman Hero [place], [place] 04/23/2015, 4:29 PM

## 2015-04-23 NOTE — ED Notes (Signed)
MD at bedside. Pt placed on 2L of oxygen due to seizure. Pt noted to have injury to tip of tongue. Family at bedside and called this RN at the start of seizure. This RN and Theatre manager at bedside during seizure to give support. Pt with normal respirations after seizure. NAD noted at this. Will continue to monitor pt.

## 2015-04-23 NOTE — ED Notes (Signed)
Pt daughter arrived and able to provide info. Pt is baseline nonverbal but able to answer yes or no. Pt was able to recognize daughter and daughter states that she is at baseline. Pt has HX of high BP and has had difficulty controlling it recently. Pt also being treated for a UTI and family states that her BP is usually high when she has UTI.

## 2015-04-23 NOTE — H&P (Signed)
[facility] History and Physical  [name] SMO:[id number] DOB: [birth year] DOA: 04/23/2015  Referring physician: ED physician, Guy Begin PCP: Cari Caraway, MD   Chief Complaint: seizures  HPI:  Patient is 79 year old female with known history of seizures, hypertension, A. 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 She is a resident of skilled nursing facility and was brought to [facility] emergency department via EMS after an episode of witnessed generalized seizure at the facility. Per emergency room doctor, patient had another generalized tonic-clonic seizure while in emergency department. Patient is unable to provide any history as noted above and daughter at bedside able to explain but patient has not received medication earlier this morning but believes she gets her medicines as prescribed. No reported chest pain or shortness of breath, no reported abdominal or urinary concerns, no reported fevers or chills. At baseline patient is wheelchair bound and nonverbal. Of note, patient takes Tegretol 100 mg 3 times a day. Daughter also reports that patient was recently treated for UTI and completed a course of ABX but she does not know the name of the medication. Daughter also reports patient has been having difficulty with blood pressure control.  In emergency department, patient is hemodynamically stable, initial vital signs notable for blood pressure 229/105, has gotten dose of hydralazine and blood pressure repeated, 179/90. Blood work notable for WBC 10.6, K3.4, INR 1.73. Neurologist has been consulted and [facility] asked to admit to step down unit for further evaluation.  Assessment and Plan: Active Problems:   Seizure - unclear etiology for repetitive seizure events, possibly related to hypertensive urgency - Patient has known  history of seizures - Appreciate neurologist following, consideration also to be given to acute post stroke seizures as patient subtherapeutic on Coumadin, INR 1.73  - Recommendation is to provide load with IV Depacon 1 g and to continue with IV Depacon 500 mg twice a day starting tomorrow - Requested VPA level in a.m., carbamazepine level pending - MRI brain to rule out new stroke versus PRES, request - Appreciate neurology team assistance    Hypertensive urgency - Patient takes at baseline clonidine, Cardizem, lisinopril, metoprolol - We'll continue same regimen here for now - Add hydralazine as needed only and readjust the regimen as clinically indicated    Recent UTI - Urinalysis is requested - We'll hold off on anti-biotics for now as patient has recently completed a course of anti-biotic    Atrial fibrillation, rate controlled, chads 2 vasc at least 6 - Coumadin per pharmacy - Continue Cardizem    History of stroke - With residual right hemiparesis and expressive aphasia - MRI brain requested to rule out    Advanced dementia - Per daughter at baseline    Mild leukocytosis - Possibly related to seizure - Repeat CBC in the morning    Mild hypokalemia - Supplement and repeat BMP in the morning   Radiological Exams on Admission: Ct Head Wo Contrast  04/23/2015   CLINICAL DATA:  History of seizure and stroke  EXAM: CT HEAD WITHOUT CONTRAST  TECHNIQUE: Contiguous axial images were obtained from the base of the skull through the vertex without intravenous contrast.  COMPARISON:  02/15/2015; 08/13/2013; brain MRI - 08/13/2013  FINDINGS: Stool sequela of left frontal craniotomy with similar appearance of resection cavity within the left frontal lobe.  Similar findings of advanced atrophy with  sulcal prominence and centralized volume loss with commensurate ex vacuo dilatation of the ventricular system. Unchanged old infarct within the right thalamus (image 12, series 201). Extensive  periventricular hypodensities compatible microvascular ischemic disease. Given background parenchymal abnormalities, there is no CT evidence of superimposed acute large territory infarct. No intraparenchymal extra-axial mass or hemorrhage. Unchanged size configuration of the ventricles and basilar cisterns. No midline shift.  Limited visualization the paranasal sinuses and mastoid air cells appear normal. No air-fluid levels. Regional soft tissues appear normal. No displaced calvarial fracture.  IMPRESSION: Similar postoperative and chronic changes of the brain without definitive acute intracranial process.   Electronically Signed   By: Sandi Mariscal M.D.   On: 04/23/2015 09:21   Dg Chest Port 1 View  04/23/2015   CLINICAL DATA:  Seizures this morning and altered mental status.  EXAM: PORTABLE CHEST - 1 VIEW  COMPARISON:  02/15/2015 and prior chest radiographs  FINDINGS: The cardiomediastinal silhouette is unremarkable.  There is no evidence of focal airspace disease, pulmonary edema, suspicious pulmonary nodule/mass, pleural effusion, or pneumothorax. No acute bony abnormalities are identified.  IMPRESSION: No active disease.   Electronically Signed   By: Margarette Canada M.D.   On: 04/23/2015 11:35     Code Status: Full Family Communication: Pt and daughter at bedside Disposition Plan: Admit for further evaluation    [facility] Piggs [facility] [phone]   Review of Systems:  Unable to obtain due to expressive aphasia    Past Medical History  Diagnosis Date  . Depressive disorder, not elsewhere classified   . Atrial fibrillation   . Unspecified transient cerebral ischemia   . Unspecified cerebral artery occlusion with cerebral infarction   . Unspecified essential hypertension   . Diverticulosis of colon (without mention of hemorrhage)   . Stricture and stenosis of esophagus   . Seizure disorder   . GERD (gastroesophageal reflux disease)   . IBS (irritable bowel syndrome)   . Stroke   . CHF (congestive  heart failure)   . Anemia   . Hyperlipidemia   . Arrhythmia   . Hx: UTI (urinary tract infection)   . Seizures     Past Surgical History  Procedure Laterality Date  . Hemorrhoid surgery    . Benign braintumor      Removal  . Flexible sigmoidoscopy  09/02/2011    Procedure: FLEXIBLE SIGMOIDOSCOPY;  Surgeon: Zenovia Jarred, MD;  Location: [facility] ENDOSCOPY;  Service: Gastroenterology;  Laterality: N/A;  . Hip arthroplasty  11/26/2011    Procedure: ARTHROPLASTY BIPOLAR HIP;  Surgeon: Alta Corning, MD;  Location: [facility] ORS;  Service: Orthopedics;  Laterality: Right;    Social History:  reports that she has never smoked. She has never used smokeless tobacco. She reports that she does not drink alcohol or use illicit drugs.  Allergies  Allergen Reactions  . Ciprofloxacin Other (See Comments)    diarrhea/yeast infection  . Codeine Other (See Comments)    unknown  . Dilantin [Phenytoin Sodium Extended]   . Levetiracetam Nausea Only    Nausea developed while on this med, worsened when dose increased.  Nausea ceased when med was d/cd.  Marland Kitchen Penicillins Hives  . Phenytoin Sodium Extended Other (See Comments)    unknown    Family History  Problem Relation Age of Onset  . Heart disease Mother   . Colon cancer Neg Hx     Prior to Admission medications   Medication Sig Start Date End Date Taking? Authorizing Provider  acetaminophen (TYLENOL) 500  MG tablet Take 500 mg by mouth every 4 (four) hours as needed (pain).    Yes Historical Provider, MD  atorvastatin (LIPITOR) 40 MG tablet Take 40 mg by mouth daily at 6 PM.    Yes Historical Provider, MD  carbamazepine (TEGRETOL) 100 MG chewable tablet Chew 100 mg by mouth 3 (three) times daily.   Yes Historical Provider, MD  citalopram (CELEXA) 20 MG tablet Take 20 mg by mouth daily.   Yes Historical Provider, MD  cloNIDine (CATAPRES) 0.1 MG tablet Take 0.1 mg by mouth every 6 (six) hours as needed (blood pressure over 160).   Yes Historical Provider, MD   conjugated estrogens (PREMARIN) vaginal cream Place 1 Applicatorful vaginally at bedtime. Apply  small amount on urethra daily   Yes Historical Provider, MD  diltiazem (CARDIZEM CD) 120 MG 24 hr capsule Take 120 mg by mouth daily. Take on an empty stomach 08/11/13  Yes Historical Provider, MD  hydrocortisone (PROCTO-PAK) 1 % CREA Apply topically at bedtime as needed (for blood in stool).   Yes Historical Provider, MD  lisinopril (PRINIVIL,ZESTRIL) 40 MG tablet Take 40 mg by mouth daily.    Yes Historical Provider, MD  loperamide (IMODIUM A-D) 2 MG tablet Take 2 mg by mouth as needed for diarrhea or loose stools.   Yes Historical Provider, MD  LORazepam (ATIVAN) 0.5 MG tablet Take 1 tablet (0.5 mg total) by mouth at bedtime. 02/22/15  Yes Hosie Poisson, MD  metoprolol (LOPRESSOR) 100 MG tablet Take 1 tablet (100 mg total) by mouth 2 (two) times daily. 02/22/15  Yes Hosie Poisson, MD  mirtazapine (REMERON) 15 MG tablet Take 15 mg by mouth at bedtime.   Yes Historical Provider, MD  ondansetron (ZOFRAN) 8 MG tablet Take 8 mg by mouth every 8 (eight) hours as needed for nausea or vomiting.   Yes Historical Provider, MD  oxyCODONE-acetaminophen (PERCOCET) 5-325 MG per tablet Take 1-2 tablets by mouth every 6 (six) hours as needed for moderate pain or severe pain. Patient taking differently: Take 1-2 tablets by mouth 2 (two) times daily.  02/22/15  Yes Hosie Poisson, MD  oxyCODONE-acetaminophen (PERCOCET/ROXICET) 5-325 MG per tablet Take 1 tablet by mouth every 6 (six) hours as needed for moderate pain or severe pain.   Yes Historical Provider, MD  Protein (PROCEL PO) Take 1 scoop by mouth 2 (two) times daily.    Yes Historical Provider, MD  saccharomyces boulardii (FLORASTOR) 250 MG capsule Take 250 mg by mouth 2 (two) times daily.    Yes Historical Provider, MD  UNABLE TO FIND Take 120 mLs by mouth 2 (two) times daily. Med Name: Med Pass   Yes Historical Provider, MD  warfarin (COUMADIN) 3 MG tablet Take 3 mg  by mouth daily.   Yes Historical Provider, MD    Physical Exam: Filed Vitals:   04/23/15 1045 04/23/15 1130 04/23/15 1145 04/23/15 1222  BP: 188/87 161/88 164/92 179/90  Pulse: 87 90 105 90  Temp:      TempSrc:      Resp: 22 19 20 18   Height:    5' (1.524 m)  Weight:    66.8 kg (147 lb 4.3 oz)  SpO2: 99% 99% 97% 94%    Physical Exam  Constitutional: Appears calm, not in acute distress HENT: Normocephalic. External right and left ear normal. Oropharynx is clear and moist.  Eyes: Conjunctivae normal. PERRLA, no scleral icterus.  Neck: Normal ROM. Neck supple. No JVD. No tracheal deviation. No thyromegaly.  CVS: Irregular  rate and rhythm, SEM 2/6, no gallops, no carotid bruit.  Pulmonary: Effort and breath sounds normal, no stridor, diminished breath sounds at bases Abdominal: Soft. BS +,  no distension, tenderness, rebound or guarding.  Musculoskeletal: No edema and no tenderness.  Lymphadenopathy: No lymphadenopathy noted, cervical, inguinal. Neuro: Alert. Follows simple commands, answers yes or no to simple question, HOH, right side hemiparesis Skin: Skin is warm and dry. No rash noted. Not diaphoretic. No erythema. No pallor.  Psychiatric: Difficult to assess due to expressive aphasia  Labs on Admission:  Basic Metabolic Panel:  Recent Labs Lab 04/23/15 0752  NA 143  K 3.4*  CL 109  CO2 25  GLUCOSE 101*  BUN 19  CREATININE 1.00  CALCIUM 8.7*   Liver Function Tests:  Recent Labs Lab 04/23/15 0752  AST 16  ALT 7*  ALKPHOS 85  BILITOT 0.4  PROT 5.7*  ALBUMIN 2.7*   No results for input(s): LIPASE, AMYLASE in the last 168 hours. No results for input(s): AMMONIA in the last 168 hours. CBC:  Recent Labs Lab 04/23/15 0752  WBC 10.6*  NEUTROABS 9.1*  HGB 12.4  HCT 38.1  MCV 102.4*  PLT 230   CBG:  Recent Labs Lab 04/23/15 0944  GLUCAP 99    EKG: no ST/T wave changes   If 7PM-7AM, please contact night-coverage [email or web address] Password  [facility] 04/23/2015, 12:36 PM

## 2015-04-23 NOTE — ED Notes (Signed)
Per EMS:  Pt with a hx of seizures, afib, and htn.  Pt experiencing all this morning.  Pt had two grand mal seizures this am witnessed by staff.  Pt hypertensive (250/130) with EMS.  EMS sts pt showing afib en route.  Pt from [place]

## 2015-04-23 NOTE — ED Notes (Signed)
MD at bedside. 

## 2015-04-23 NOTE — Progress Notes (Signed)
ANTICOAGULATION CONSULT NOTE - Initial Consult  Pharmacy Consult for warfarin Indication: atrial fibrillation  Allergies  Allergen Reactions  . Ciprofloxacin Other (See Comments)    diarrhea/yeast infection  . Codeine Other (See Comments)    unknown  . Dilantin [Phenytoin Sodium Extended]   . Levetiracetam Nausea Only    Nausea developed while on this med, worsened when dose increased.  Nausea ceased when med was d/cd.  Marland Kitchen Penicillins Hives  . Phenytoin Sodium Extended Other (See Comments)    unknown    Patient Measurements: Height: 5' (152.4 cm) Weight: 147 lb 4.3 oz (66.8 kg) IBW/kg (Calculated) : 45.5  Vital Signs: Temp: 98.7 F (37.1 C) (07/30 0658) Temp Source: Oral (07/30 0658) BP: 179/90 mmHg (07/30 1222) Pulse Rate: 90 (07/30 1222)  Labs:  Recent Labs  04/23/15 0752  HGB 12.4  HCT 38.1  PLT 230  LABPROT 20.3*  INR 1.73*  CREATININE 1.00    Estimated Creatinine Clearance: 40.8 mL/min (by C-G formula based on Cr of 1).   Medical History: Past Medical History  Diagnosis Date  . Depressive disorder, not elsewhere classified   . Atrial fibrillation   . Unspecified transient cerebral ischemia   . Unspecified cerebral artery occlusion with cerebral infarction   . Unspecified essential hypertension   . Diverticulosis of colon (without mention of hemorrhage)   . Stricture and stenosis of esophagus   . Seizure disorder   . GERD (gastroesophageal reflux disease)   . IBS (irritable bowel syndrome)   . Stroke   . CHF (congestive heart failure)   . Anemia   . Hyperlipidemia   . Arrhythmia   . Hx: UTI (urinary tract infection)   . Seizures    Assessment: 38 yof presented to the hospital from SNF with seizures. She has a known history of seizures. She is chronically anticoagulated with warfarin for hx afib. INR is subtherapeutic today at 1.73. CBC is WNL and no bleeding noted.   Goal of Therapy:  INR 2-3 Monitor platelets by anticoagulation protocol:  Yes   Plan:  - Warfarin 5mg  PO x 1 tonight - Daily INR  Baker Kogler, Rande Lawman 04/23/2015,1:11 PM

## 2015-04-24 DIAGNOSIS — I160 Hypertensive urgency: Secondary | ICD-10-CM | POA: Diagnosis present

## 2015-04-24 DIAGNOSIS — R569 Unspecified convulsions: Secondary | ICD-10-CM

## 2015-04-24 DIAGNOSIS — I480 Paroxysmal atrial fibrillation: Secondary | ICD-10-CM

## 2015-04-24 DIAGNOSIS — F0390 Unspecified dementia without behavioral disturbance: Secondary | ICD-10-CM

## 2015-04-24 DIAGNOSIS — I6930 Unspecified sequelae of cerebral infarction: Secondary | ICD-10-CM | POA: Diagnosis present

## 2015-04-24 DIAGNOSIS — F03C0 Unspecified dementia, severe, without behavioral disturbance, psychotic disturbance, mood disturbance, and anxiety: Secondary | ICD-10-CM | POA: Diagnosis present

## 2015-04-24 DIAGNOSIS — Z8673 Personal history of transient ischemic attack (TIA), and cerebral infarction without residual deficits: Secondary | ICD-10-CM

## 2015-04-24 DIAGNOSIS — I10 Essential (primary) hypertension: Principal | ICD-10-CM

## 2015-04-24 LAB — BASIC METABOLIC PANEL
Anion gap: 8 (ref 5–15)
BUN: 16 mg/dL (ref 6–20)
CALCIUM: 8.8 mg/dL — AB (ref 8.9–10.3)
CO2: 27 mmol/L (ref 22–32)
CREATININE: 1 mg/dL (ref 0.44–1.00)
Chloride: 105 mmol/L (ref 101–111)
GFR calc Af Amer: >60 mL/min (ref >60)
GFR calc non Af Amer: 53 mL/min — ABNORMAL LOW (ref >60)
GLUCOSE: 99 mg/dL (ref 65–99)
Potassium: 3.7 mmol/L (ref 3.5–5.1)
Sodium: 140 mmol/L (ref 135–145)

## 2015-04-24 LAB — URINE CULTURE: Culture: NO GROWTH

## 2015-04-24 LAB — VALPROIC ACID LEVEL: Valproic Acid Lvl: 49 ug/mL — ABNORMAL LOW (ref 50.0–100.0)

## 2015-04-24 LAB — CBC
HCT: 38.2 % (ref 36.0–46.0)
HEMOGLOBIN: 12.5 g/dL (ref 12.0–15.0)
MCH: 33.1 pg (ref 26.0–34.0)
MCHC: 32.7 g/dL (ref 30.0–36.0)
MCV: 101.1 fL — ABNORMAL HIGH (ref 78.0–100.0)
PLATELETS: 247 10*3/uL (ref 150–400)
RBC: 3.78 MIL/uL — AB (ref 3.87–5.11)
RDW: 14 % (ref 11.5–15.5)
WBC: 9.1 10*3/uL (ref 4.0–10.5)

## 2015-04-24 LAB — PROTIME-INR
INR: 1.9 — ABNORMAL HIGH (ref 0.00–1.49)
Prothrombin Time: 21.7 seconds — ABNORMAL HIGH (ref 11.6–15.2)

## 2015-04-24 MED ORDER — WARFARIN SODIUM 5 MG PO TABS
5.0000 mg | ORAL_TABLET | Freq: Once | ORAL | Status: AC
  Administered 2015-04-24: 5 mg via ORAL
  Filled 2015-04-24: qty 1

## 2015-04-24 MED ORDER — CLONIDINE HCL 0.1 MG PO TABS
0.1000 mg | ORAL_TABLET | Freq: Four times a day (QID) | ORAL | Status: DC
  Administered 2015-04-24 – 2015-04-25 (×6): 0.1 mg via ORAL
  Filled 2015-04-24 (×8): qty 1

## 2015-04-24 NOTE — Progress Notes (Signed)
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 fib on Coumadin, esophageal stricture/stenosis, irritable bowel syndrome, diverticulosis, HLD. She is a resident of skilled nursing facility  Brought to [facility] emergency department via EMS after an episode of witnessed generalized seizure at the facility. Per emergency room doctor, patient had another generalized tonic-clonic seizure while in emergency department. Patient is unable to provide any history as noted above and daughter at bedside able to explain but patient has not received medication earlier this morning but believes she gets her medicines as prescribed. No reported chest pain or shortness of breath, no reported abdominal or urinary concerns, no reported fevers or chills. At baseline patient is wheelchair bound and nonverbal. Of note, patient takes Tegretol 100 mg 3 times a day. Daughter also reports that patient was recently treated for UTI and completed a course of ABX but she does not know the name of the medication. Daughter also reports patient has been having difficulty with blood pressure control.  In emergency department, patient is hemodynamically stable, initial vital signs notable for blood pressure 229/105, has gotten dose of hydralazine and blood pressure repeated, 179/90. Blood work notable for WBC 10.6, K3.4, INR 1.73. Neurologist has been consulted and [facility] asked to admit to step down unit for further evaluation.  HPI/Subjective: 7/31 awake alert able to answer yes no questions, follows commands.  Assessment/Plan: Seizure - unclear etiology for repetitive seizure events, possibly  related to hypertensive urgency - Patient has known history of seizures - Recommendation is to load with IV Depacon 1 g and to continue with IV Depacon 500 mg BID  -Requested VPA level in a.m., carbamazepine level pending - MRI brain; negative for acute stroke/ PRES, request - Appreciate neurology team assistance   Hypertensive urgency -Change clonidine to scheduled 0.1 mg QID; had been PRN (most likely cause of difficult to control blood pressure; rebound HTN) -Cardizem 120 mg daily -Lisinopril 40 mg daily -Metoprolol 100 mg BID    Atrial fibrillation, rate controlled, chads 2 vasc at least 6 - Coumadin per pharmacy -subtherapeutic on Coumadin, INR 1.9  - Continue Cardizem  Chronic stroke - With residual right hemiparesis and expressive aphasia - MRI brain negative acute stroke  Advanced dementia - Unknown baseline however per admission note advanced Per daughter      Code Status: FULL Family Communication: no family present at time of exam Disposition Plan: Resolution seizures/uncontrolled BP    Consultants: Cathie Olden Camilo (neurology)   Procedure/Significant Events: 7/30 MRI brain without contrast;-Stable postoperative changes left frontal lobe. -Stable remote lacunar infarcts basal ganglia. 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   Culture 7/30 urine pending 7/30 MRSA by PCR negative   Antibiotics: NA  DVT prophylaxis: Warfarin   Devices    LINES / TUBES:      Continuous Infusions:   Objective: VITAL SIGNS: Temp: 98.9 F (37.2 C) (07/31 0900) Temp Source: Oral (07/31 0900) BP: 172/75 mmHg (07/31 1000) Pulse Rate: 91 (07/31 1000) SPO2; FIO2:   Intake/Output Summary (Last 24 hours) at 04/24/15 1123 Last data filed at 04/24/15 0900  Gross per 24 hour  Intake    120 ml  Output      0  ml  Net    120 ml     Exam: General: awake alert able to answer yes no questions, follows commands, No acute respiratory  distress Eyes: negative scleral hemorrhage ENT: Negative Runny nose, negative gingival bleeding Neck:  Negative scars, masses, torticollis, lymphadenopathy, JVD Lungs: Clear to auscultation bilaterally without wheezes or crackles Cardiovascular: Irregular irregular rhythm and rate, negative murmur gallop or rub normal S1 and S2 Abdomen:negative abdominal pain, Nontender, nondistended, soft, bowel sounds positive, no rebound, no ascites, no appreciable mass Extremities: moves all extremities, right hand contracted, right foot plantar flexed. No significant cyanosis, clubbing, or edema bilateral lower extremities Psychiatric:  Unable to assess  Neurologic:  Cranial nerves II through XII intact, tongue/uvula midline, moves all extremities, right side hemiparesis sensation intact throughout, positive dysarthria, positive expressive aphasia, negative receptive aphasia.     Data Reviewed: Basic Metabolic Panel:  Recent Labs Lab 04/23/15 0752 04/24/15 0228  NA 143 140  K 3.4* 3.7  CL 109 105  CO2 25 27  GLUCOSE 101* 99  BUN 19 16  CREATININE 1.00 1.00  CALCIUM 8.7* 8.8*   Liver Function Tests:  Recent Labs Lab 04/23/15 0752  AST 16  ALT 7*  ALKPHOS 85  BILITOT 0.4  PROT 5.7*  ALBUMIN 2.7*   No results for input(s): LIPASE, AMYLASE in the last 168 hours. No results for input(s): AMMONIA in the last 168 hours. CBC:  Recent Labs Lab 04/23/15 0752 04/24/15 0228  WBC 10.6* 9.1  NEUTROABS 9.1*  --   HGB 12.4 12.5  HCT 38.1 38.2  MCV 102.4* 101.1*  PLT 230 247   Cardiac Enzymes: No results for input(s): CKTOTAL, CKMB, CKMBINDEX, TROPONINI in the last 168 hours. BNP (last 3 results) No results for input(s): BNP in the last 8760 hours.  ProBNP (last 3 results) No results for input(s): PROBNP in the last 8760 hours.  CBG:  Recent Labs Lab 04/23/15 0944  GLUCAP 99    Recent Results (from the past 240 hour(s))  Urine culture     Status: None   Collection Time:  04/23/15  8:50 AM  Result Value Ref Range Status   Specimen Description URINE, CATHETERIZED  Final   Special Requests NONE  Final   Culture NO GROWTH 1 DAY  Final   Report Status 04/24/2015 FINAL  Final  MRSA PCR Screening     Status: None   Collection Time: 04/23/15 12:44 PM  Result Value Ref Range Status   MRSA by PCR NEGATIVE NEGATIVE Final    Comment:        The GeneXpert MRSA Assay (FDA approved for NASAL specimens only), is one component of a comprehensive MRSA colonization surveillance program. It is not intended to diagnose MRSA infection nor to guide or monitor treatment for MRSA infections.      Studies:  Recent x-ray studies have been reviewed in detail by the Attending Physician  Scheduled Meds:  Scheduled Meds: . atorvastatin  40 mg Oral q1800  . citalopram  20 mg Oral Daily  . cloNIDine  0.1 mg Oral Q6H  . diltiazem  120 mg Oral Daily  . lisinopril  40 mg Oral Daily  . LORazepam  0.5 mg Oral QHS  . metoprolol  100 mg Oral BID  . mirtazapine  15 mg Oral QHS  . saccharomyces boulardii  250 mg Oral BID  . valproate sodium  500 mg Intravenous Q12H  . warfarin  5 mg Oral ONCE-1800  . Warfarin - Pharmacist Dosing Inpatient  Does not apply q1800    Time spent on care of this patient: 40 mins   Chidiebere Wynn, Geraldo Docker , MD  [facility] Office  [phone] Pager [phone]  On-Call/Text Page:      [email or web address]      password [email or web address]  If 7PM-7AM, please contact night-coverage [email or web address] Password [email or web address] 04/24/2015, 11:23 AM   LOS: 1 day   Care during the described time interval was provided by me .  I have reviewed this patient's available data, including medical history, events of note, physical examination, and all test results as part of my evaluation. I have personally reviewed and interpreted all radiology studies.   Dia Crawford, MD [phone] Pager

## 2015-04-24 NOTE — Progress Notes (Signed)
ANTICOAGULATION CONSULT NOTE - Follow-up  Pharmacy Consult for warfarin Indication: atrial fibrillation  Allergies  Allergen Reactions  . Ciprofloxacin Other (See Comments)    diarrhea/yeast infection  . Codeine Other (See Comments)    unknown  . Dilantin [Phenytoin Sodium Extended]   . Levetiracetam Nausea Only    Nausea developed while on this med, worsened when dose increased.  Nausea ceased when med was d/cd.  Marland Kitchen Penicillins Hives  . Phenytoin Sodium Extended Other (See Comments)    unknown    Patient Measurements: Height: 5' (152.4 cm) Weight: 147 lb 4.3 oz (66.8 kg) IBW/kg (Calculated) : 45.5  Vital Signs: Temp: 98.9 F (37.2 C) (07/31 0900) Temp Source: Oral (07/31 0900) BP: 172/75 mmHg (07/31 1000) Pulse Rate: 91 (07/31 1000)  Labs:  Recent Labs  04/23/15 0752 04/24/15 0228  HGB 12.4 12.5  HCT 38.1 38.2  PLT 230 247  LABPROT 20.3* 21.7*  INR 1.73* 1.90*  CREATININE 1.00 1.00    Estimated Creatinine Clearance: 40.8 mL/min (by C-G formula based on Cr of 1).  Assessment: 89 yof presented to the hospital from SNF with seizures. She has a known history of seizures. She is chronically anticoagulated with warfarin for hx afib. INR remains below goal at 1.9. CBC is WNL and no bleeding noted.   Goal of Therapy:  INR 2-3   Plan:  - Repeat Warfarin 5mg  PO x 1 tonight - Daily INR  Lloyd Ayo, Rande Lawman 04/24/2015,10:41 AM

## 2015-04-24 NOTE — Progress Notes (Signed)
NEURO HOSPITALIST PROGRESS NOTE   SUBJECTIVE:                                                                                                                        Resting comfortably in bed. Answer " yes" to all my questions (this is her baseline due to advanced dementia). No further seizures reported. MRI brain was personally reviewed and revealed no acute abnormality. Carbamazepine level 6.6, VPA level pending.  OBJECTIVE:                                                                                                                           Vital signs in last 24 hours: Temp:  [97.8 F (36.6 C)-98.8 F (37.1 C)] 98.8 F (37.1 C) (07/31 0415) Pulse Rate:  [71-105] 71 (07/31 0415) Resp:  [14-24] 17 (07/31 0415) BP: (123-235)/(54-103) 207/85 mmHg (07/31 0809) SpO2:  [94 %-99 %] 95 % (07/31 0415) Weight:  [66.8 kg (147 lb 4.3 oz)] 66.8 kg (147 lb 4.3 oz) (07/30 1222)  Intake/Output from previous day:   Intake/Output this shift:   Nutritional status: DIET DYS 2 Room service appropriate?: Yes; Fluid consistency:: Thin  Past Medical History  Diagnosis Date  . Depressive disorder, not elsewhere classified   . Atrial fibrillation   . Unspecified transient cerebral ischemia   . Unspecified cerebral artery occlusion with cerebral infarction   . Unspecified essential hypertension   . Diverticulosis of colon (without mention of hemorrhage)   . Stricture and stenosis of esophagus   . Seizure disorder   . GERD (gastroesophageal reflux disease)   . IBS (irritable bowel syndrome)   . Stroke   . CHF (congestive heart failure)   . Anemia   . Hyperlipidemia   . Arrhythmia   . Hx: UTI (urinary tract infection)   . Seizures     Physical exam: pleasant female in no apparent distress. Head: normocephalic. Neck: supple, no bruits, no JVD. Cardiac: no murmurs. Lungs: clear. Abdomen: soft, no tender, no mass. Extremities: no edema. Skin: no  rash   Neurologic Exam:  General: Mental Status: Alert and awake, non verbal. Can mimic but is not able to follow commands. Cranial Nerves: II: Discs flat bilaterally; Visual fields grossly normal, pupils  equal, round, reactive to light and accommodation III,IV, VI: ptosis not present, extra-ocular motions intact bilaterally V,VII: smile symmetric, facial light touch sensation normal bilaterally VIII: hearing normal bilaterally IX,X: uvula rises symmetrically XI: bilateral shoulder shrug XII: midline tongue extension without atrophy or fasciculations Motor: Residual right hemiparesis. Tone modestly increased in the right. Sensory: Pinprick and light touch intact throughout, bilaterally Deep Tendon Reflexes:  Brisker in the right compared to the left. Plantars: Right: downgoingLeft: downgoing Cerebellar: Unable to test because she is not able to follow commands. Gait:  Patient is wheelchair bound at baseline    Lab Results: Lab Results  Component Value Date/Time   CHOL 182 03/08/2011 11:27 PM   Lipid Panel No results for input(s): CHOL, TRIG, HDL, CHOLHDL, VLDL, LDLCALC in the last 72 hours.  Studies/Results: Ct Head Wo Contrast  04/23/2015   CLINICAL DATA:  History of seizure and stroke  EXAM: CT HEAD WITHOUT CONTRAST  TECHNIQUE: Contiguous axial images were obtained from the base of the skull through the vertex without intravenous contrast.  COMPARISON:  02/15/2015; 08/13/2013; brain MRI - 08/13/2013  FINDINGS: Stool sequela of left frontal craniotomy with similar appearance of resection cavity within the left frontal lobe.  Similar findings of advanced atrophy with sulcal prominence and centralized volume loss with commensurate ex vacuo dilatation of the ventricular system. Unchanged old infarct within the right thalamus (image 12, series 201). Extensive periventricular hypodensities compatible microvascular ischemic disease. Given background  parenchymal abnormalities, there is no CT evidence of superimposed acute large territory infarct. No intraparenchymal extra-axial mass or hemorrhage. Unchanged size configuration of the ventricles and basilar cisterns. No midline shift.  Limited visualization the paranasal sinuses and mastoid air cells appear normal. No air-fluid levels. Regional soft tissues appear normal. No displaced calvarial fracture.  IMPRESSION: Similar postoperative and chronic changes of the brain without definitive acute intracranial process.   Electronically Signed   By: Sandi Mariscal M.D.   On: 04/23/2015 09:21   Mr Brain Wo Contrast  04/23/2015   CLINICAL DATA:  Witnessed seizure. History of seizures. Personal history of tumor resection and left frontal lobectomy.  EXAM: MRI HEAD WITHOUT CONTRAST  TECHNIQUE: Multiplanar, multiecho pulse sequences of the brain and surrounding structures were obtained without intravenous contrast.  COMPARISON:  CT head from the same day.  MRI brain 08/13/2013.  FINDINGS: No acute infarct is present. Cystic dilation of the left lateral ventricle following resection of a left frontal lobe lesion is stable. Ex vacuo dilation of the posterior aspect of the left lateral ventricle is stable is well. Remote lacunar infarcts in the basal ganglia are stable. Diffuse white matter changes are again seen.  No acute infarct, hemorrhage, or mass lesion is present. The left internal carotid artery is occluded at the skull base without significant change. The right internal carotid artery is patent. The posterior circulation is patent.  Globes orbits are intact. The paranasal sinuses and mastoid air cells are clear.  IMPRESSION: 1. Stable postoperative changes of the left frontal lobe. 2. Stable remote lacunar infarcts of the basal ganglia. 3. 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 4. Stable diffuse white matter disease. 5. No acute or focal lesion to explain seizures.   Electronically Signed    By: San Morelle M.D.   On: 04/23/2015 16:55   Dg Chest Port 1 View  04/23/2015   CLINICAL DATA:  Seizures this morning and altered mental status.  EXAM: PORTABLE CHEST - 1 VIEW  COMPARISON:  02/15/2015 and  prior chest radiographs  FINDINGS: The cardiomediastinal silhouette is unremarkable.  There is no evidence of focal airspace disease, pulmonary edema, suspicious pulmonary nodule/mass, pleural effusion, or pneumothorax. No acute bony abnormalities are identified.  IMPRESSION: No active disease.   Electronically Signed   By: Margarette Canada M.D.   On: 04/23/2015 11:35    MEDICATIONS                                                                                                                        Scheduled: . atorvastatin  40 mg Oral q1800  . citalopram  20 mg Oral Daily  . diltiazem  120 mg Oral Daily  . lisinopril  40 mg Oral Daily  . LORazepam  0.5 mg Oral QHS  . metoprolol  100 mg Oral BID  . mirtazapine  15 mg Oral QHS  . saccharomyces boulardii  250 mg Oral BID  . valproate sodium  500 mg Intravenous Q12H  . Warfarin - Pharmacist Dosing Inpatient   Does not apply q1800    ASSESSMENT/PLAN:                                                                                                            79 y/o with history of remote symptomatic GTC epilepsy, admitted to [facility] with repetitive seizures that can certainly be related to UTI or hypertensive urgency. Carbamazepine level is therapeutic, VPA level pending.  Back to baseline.  Would recommend maintaining patient on Depakote during current hospitalization and afterwards in the outpatient setting she probably may continue on monotherapy with carbamazepine. Neuro will sign off.  Dorian Pod, MD [facility] [phone]  04/24/2015, 9:03 AM

## 2015-04-25 LAB — PROTIME-INR
INR: 2.9 — ABNORMAL HIGH (ref 0.00–1.49)
Prothrombin Time: 29.8 s — ABNORMAL HIGH (ref 11.6–15.2)

## 2015-04-25 MED ORDER — CLONIDINE HCL 0.1 MG PO TABS
0.2000 mg | ORAL_TABLET | Freq: Three times a day (TID) | ORAL | Status: DC
  Administered 2015-04-25 – 2015-04-26 (×2): 0.2 mg via ORAL
  Filled 2015-04-25 (×2): qty 2

## 2015-04-25 MED ORDER — STARCH (THICKENING) PO POWD
ORAL | Status: DC | PRN
  Filled 2015-04-25: qty 227

## 2015-04-25 NOTE — Progress Notes (Signed)
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 fib on Coumadin, esophageal stricture/stenosis, irritable bowel syndrome, diverticulosis, and HLD who is a resident of skilled nursing facility.  She was brought to [facility] emergency department via EMS after an episode of witnessed generalized seizure at the facility. Per emergency room doctor, patient had another generalized tonic-clonic seizure while in emergency department.  Of note, patient takes Tegretol 100 mg 3 times a day. Daughter also reported that patient was recently treated for UTI and completed a course of ABX but she does not know the name of the medication.   In emergency department initial blood pressure was 229/105.  Blood work notable for WBC 10.6, K3.4, INR 1.73. Neurologist has been consulted and [facility] asked to admit to step down unit for further evaluation.  HPI/Subjective: The patient is alert and pleasant.  Though she cannot communicate clearly given her aphasia she does not appear to be in any distress and appears to be quite comfortable.  Assessment/Plan:  Seizure - Seizure activity likely related to hypertensive urgency plus/minus recent UTI - Patient has known history of seizures - Medication titration/management as per neurology recommendations - her seizures appear to be well-controlled at the present time  Hypertensive urgency -Blood pressure control improving - continue to titrate meds and follow trend  Atrial fibrillation, rate controlled, chads 2 vasc at least 6 - rate controlled - INR at goal   Hx of prior L brain stroke - With residual right hemiparesis and  expressive aphasia - MRI brain negative acute stroke - cleared for D2 diet w/ nectar liquids per SLP - initiate diet and follow  Advanced dementia - Unknown baseline however per admission note advanced per daughter - plan will be to return to SNF  Code Status: FULL Family Communication: no family present at time of exam Disposition Plan: Resolution seizures/uncontrolled BP - ultimate plan for return to SNF  Consultants: Neurology  Procedures: 7/30 MRI brain without contrast;-Stable postoperative changes left frontal lobe. -Stable remote lacunar infarcts basal ganglia. 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  Antibiotics: NA  DVT prophylaxis: Warfarin  Objective: Blood pressure 119/55, pulse 73, temperature 98.7 F (37.1 C), temperature source Oral, resp. rate 16, height 5' (1.524 m), weight 66.8 kg (147 lb 4.3 oz), SpO2 95 %.  Intake/Output Summary (Last 24 hours) at 04/25/15 1502 Last data filed at 04/25/15 2831  Gross per 24 hour  Intake    410 ml  Output      0 ml  Net    410 ml   Exam: General: No acute respiratory distress Lungs: Clear to auscultation bilaterally without wheezes or crackles Cardiovascular: Regular rate and rhythm without murmur gallop or rub normal S1 and S2 Abdomen: Nontender, nondistended, soft, bowel sounds positive, no rebound, no ascites, no appreciable mass Extremities: No significant cyanosis, clubbing, or edema bilateral lower extremities  Data Reviewed: Basic Metabolic Panel:  Recent Labs Lab 04/23/15 0752 04/24/15 0228  NA 143 140  K 3.4* 3.7  CL 109 105  CO2 25 27  GLUCOSE 101* 99  BUN 19 16  CREATININE 1.00 1.00  CALCIUM 8.7* 8.8*   Liver Function Tests:  Recent Labs  Lab 04/23/15 0752  AST 16  ALT 7*  ALKPHOS 85  BILITOT 0.4  PROT 5.7*  ALBUMIN 2.7*   CBC:  Recent Labs Lab 04/23/15 0752 04/24/15 0228  WBC 10.6* 9.1  NEUTROABS 9.1*  --   HGB 12.4 12.5  HCT 38.1 38.2  MCV 102.4* 101.1*    PLT 230 247   CBG:  Recent Labs Lab 04/23/15 0944  GLUCAP 99    Recent Results (from the past 240 hour(s))  Urine culture     Status: None   Collection Time: 04/23/15  8:50 AM  Result Value Ref Range Status   Specimen Description URINE, CATHETERIZED  Final   Special Requests NONE  Final   Culture NO GROWTH 1 DAY  Final   Report Status 04/24/2015 FINAL  Final  MRSA PCR Screening     Status: None   Collection Time: 04/23/15 12:44 PM  Result Value Ref Range Status   MRSA by PCR NEGATIVE NEGATIVE Final    Comment:        The GeneXpert MRSA Assay (FDA approved for NASAL specimens only), is one component of a comprehensive MRSA colonization surveillance program. It is not intended to diagnose MRSA infection nor to guide or monitor treatment for MRSA infections.      Studies:  Recent x-ray studies have been reviewed in detail by the Attending Physician  Scheduled Meds:  Scheduled Meds: . atorvastatin  40 mg Oral q1800  . citalopram  20 mg Oral Daily  . cloNIDine  0.1 mg Oral Q6H  . diltiazem  120 mg Oral Daily  . lisinopril  40 mg Oral Daily  . LORazepam  0.5 mg Oral QHS  . metoprolol  100 mg Oral BID  . mirtazapine  15 mg Oral QHS  . saccharomyces boulardii  250 mg Oral BID  . valproate sodium  500 mg Intravenous Q12H  . Warfarin - Pharmacist Dosing Inpatient   Does not apply q1800    Time spent on care of this patient: 57 mins  Cherene Altes, MD [facility] For Consults/Admissions - Flow Manager - [phone] Office  [phone]  Contact MD directly via text page:      [email or web address]      password [facility]  04/25/2015, 3:02 PM   LOS: 2 days

## 2015-04-25 NOTE — Progress Notes (Signed)
ANTICOAGULATION CONSULT NOTE - Follow Up Consult  Pharmacy Consult for Coumadin Indication: atrial fibrillation  Allergies  Allergen Reactions  . Ciprofloxacin Other (See Comments)    diarrhea/yeast infection  . Codeine Other (See Comments)    unknown  . Dilantin [Phenytoin Sodium Extended]   . Levetiracetam Nausea Only    Nausea developed while on this med, worsened when dose increased.  Nausea ceased when med was d/cd.  Marland Kitchen Penicillins Hives  . Phenytoin Sodium Extended Other (See Comments)    unknown    Patient Measurements: Height: 5' (152.4 cm) Weight: 147 lb 4.3 oz (66.8 kg) IBW/kg (Calculated) : 45.5  Vital Signs: Temp: 98 F (36.7 C) (08/01 0719) Temp Source: Oral (08/01 0719) BP: 183/88 mmHg (08/01 0924) Pulse Rate: 80 (08/01 0924)  Labs:  Recent Labs  04/23/15 0752 04/24/15 0228 04/25/15 0210  HGB 12.4 12.5  --   HCT 38.1 38.2  --   PLT 230 247  --   LABPROT 20.3* 21.7* 29.8*  INR 1.73* 1.90* 2.90*  CREATININE 1.00 1.00  --     Estimated Creatinine Clearance: 40.8 mL/min (by C-G formula based on Cr of 1).  Assessment: 66 yof presented to the hospital from SNF with seizures. She has a known history of seizures. She is chronically anticoagulated with warfarin for hx afib. INR 1.9 on admit. PTA dose is 3mg  daily - last dose PTA 7/29. Now INR jumped up to 2.9 today. Of note, pt was on CBZ PTA which can decrease the INR, this hasn't been resumed yet. CBC stable, no bleeding noted  Goal of Therapy:  INR 2-3 Monitor platelets by anticoagulation protocol: Yes   Plan:  HOLD coumadin today Monitor daily INR, CBC, s/s of bleed  Von Quint[place]ar J 04/25/2015,10:32 AM

## 2015-04-25 NOTE — Clinical Social Work Note (Signed)
CSW Consult Acknowledged:   CSW received a consult for SNF placement. CSW left a voice message for the pt's daughter [name] to confirm [facility] placement. Full assessment will follow.   [place], MSW, [place]

## 2015-04-25 NOTE — Progress Notes (Signed)
Speech Language Pathology Treatment: Dysphagia  Patient Details Name: [name] MRN: [id number] DOB: 07/01/1936 Today's Date: 04/25/2015 Time: 1324-4010 SLP Time Calculation (min) (ACUTE ONLY): 29 min  Assessment / Plan / Recommendation Clinical Impression  Pt presents with stable oral dysphagia, probable delayed swallow. Pt is tolerating dys 2 texture with nectar thick liquids with careful feeding. Pt did cough x2 with mixed consistencies. Does better when allowed to grasp cup and initiate sips. Recommend pt continue current diet, no further interventions needed. Will sign off.    HPI Other Pertinent Information: Patient is 79 year old female with known history of seizures, hypertension, A. 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 She is a resident of skilled nursing facility and was brought to [facility] emergency department via EMS after an episode of witnessed generalized seizure at the facility. Per emergency room doctor, patient had another generalized tonic-clonic seizure while in emergency department. Chest x ray showed no active disease. History of dysphagia with last diet nectar thick liquids and dysphagia 2 (chopped) consistencies.    Pertinent Vitals    SLP Plan  All goals met    Recommendations Diet recommendations: Dysphagia 2 (fine chop);Nectar-thick liquid Liquids provided via: Cup;No straw Medication Administration: Whole meds with puree Supervision: Staff to assist with self feeding;Full supervision/cueing for compensatory strategies Compensations: Minimize environmental distractions;Slow rate;Small sips/bites;Follow solids with liquid (dont mix consistencies)              Oral Care Recommendations: Oral care BID Plan: All goals met    GO     Ignatz Deis, Katherene Ponto 04/25/2015, 9:29 AM

## 2015-04-26 LAB — COMPREHENSIVE METABOLIC PANEL
ALT: 7 U/L — ABNORMAL LOW (ref 14–54)
ANION GAP: 9 (ref 5–15)
AST: 13 U/L — AB (ref 15–41)
Albumin: 2.4 g/dL — ABNORMAL LOW (ref 3.5–5.0)
Alkaline Phosphatase: 68 U/L (ref 38–126)
BUN: 25 mg/dL — AB (ref 6–20)
CALCIUM: 8.2 mg/dL — AB (ref 8.9–10.3)
CO2: 26 mmol/L (ref 22–32)
Chloride: 104 mmol/L (ref 101–111)
Creatinine, Ser: 1.16 mg/dL — ABNORMAL HIGH (ref 0.44–1.00)
GFR, EST AFRICAN AMERICAN: 52 mL/min — AB (ref >60)
GFR, EST NON AFRICAN AMERICAN: 45 mL/min — AB (ref >60)
GLUCOSE: 78 mg/dL (ref 65–99)
Potassium: 3.9 mmol/L (ref 3.5–5.1)
Sodium: 139 mmol/L (ref 135–145)
Total Bilirubin: 0.4 mg/dL (ref 0.3–1.2)
Total Protein: 5.2 g/dL — ABNORMAL LOW (ref 6.5–8.1)

## 2015-04-26 LAB — CBC
HCT: 38 % (ref 36.0–46.0)
Hemoglobin: 12.3 g/dL (ref 12.0–15.0)
MCH: 33.2 pg (ref 26.0–34.0)
MCHC: 32.4 g/dL (ref 30.0–36.0)
MCV: 102.7 fL — ABNORMAL HIGH (ref 78.0–100.0)
PLATELETS: 234 10*3/uL (ref 150–400)
RBC: 3.7 MIL/uL — AB (ref 3.87–5.11)
RDW: 13.9 % (ref 11.5–15.5)
WBC: 7.4 10*3/uL (ref 4.0–10.5)

## 2015-04-26 LAB — PROTIME-INR
INR: 2.97 — AB (ref 0.00–1.49)
PROTHROMBIN TIME: 30.4 s — AB (ref 11.6–15.2)

## 2015-04-26 MED ORDER — VALPROIC ACID 250 MG PO CAPS
500.0000 mg | ORAL_CAPSULE | Freq: Two times a day (BID) | ORAL | Status: DC
Start: 2015-04-26 — End: 2015-05-25

## 2015-04-26 MED ORDER — VALPROIC ACID 250 MG PO CAPS
500.0000 mg | ORAL_CAPSULE | Freq: Two times a day (BID) | ORAL | Status: DC
  Administered 2015-04-26: 500 mg via ORAL
  Filled 2015-04-26: qty 2

## 2015-04-26 MED ORDER — STARCH (THICKENING) PO POWD: 1.0000 g | ORAL | Status: DC | PRN

## 2015-04-26 MED ORDER — CLONIDINE HCL 0.2 MG PO TABS: 0.2000 mg | ORAL_TABLET | Freq: Three times a day (TID) | ORAL | Status: DC

## 2015-04-26 NOTE — Clinical Social Work Note (Signed)
CSW spoke with the pt's daughter [name]. CSW introduce self and purpose of the call. CSW informed [name] that the pt will discharge back to [facility] today. CSW and [name] discussed ambulance transport. CSW contact [facility] at [phone] to schedule transport for the pt. CSW upload the pt's discharge summary. Bedside RN provided the phone number to call reported.   [place], MSW, [place]

## 2015-04-26 NOTE — Progress Notes (Signed)
OT Cancellation Note  Patient Details Name: [name] MRN: [id number] DOB: [birth year]   Cancelled Treatment:    Reason Eval/Treat Not Completed: OT screened, no needs identified, will sign off. Per chart review and discussion with PT pt appears to be at baseline with ADLs.   [name] 04/26/2015, 10:50 AM

## 2015-04-26 NOTE — Clinical Social Work Note (Signed)
Clinical Social Work Assessment  Patient Details  Name: MERRY POND MRN: [id number] Date of Birth: Mar 23, 1936  Date of referral:  04/26/15               Reason for consult:  Facility Placement ([place] )                Housing/Transportation Living arrangements for the past 2 months:  [place] of Information:  Adult Children [name]) Patient Interpreter Needed:  None Criminal Activity/Legal Involvement Pertinent to Current Situation/Hospitalization:  No - Comment as needed Significant Relationships:  Adult Children Lives with:  Facility Resident Do you feel safe going back to the place where you live?  Yes Need for family participation in patient care:  Yes (Comment)  Care giving concerns:  None    Social Worker assessment / plan:  CSW spoke with the pt's daughter [name]. CSW introduced self and purpose of the call. [name] confirmed that the pt is a long term care resident at [facility]. [name] reported that the pt will return to [facility] upon discharge. [name] reported that she did not have any questions for the CSW. CSW will continue to follow this pt and assist with discharge as needed.   Employment status:  Retired Nurse, adult PT Recommendations:  Not assessed at this time Information / Referral to community resources:   (N/A)  Patient/Family's Response to care:  [name] is pleased with the care in which the pt has received.   Patient/Family's Understanding of and Emotional Response to Diagnosis, Current Treatment, and Prognosis:  [name] acknowledged the pt's diagnosis. [name] acknowledged the pt's current treatment. [name] acknowledged that returning back to [facility] in is in the pt best interest.      Emotional Assessment Appearance:   (Unable to Assess ) Attitude/Demeanor/Rapport:  Unable to Assess Affect (typically observed):  Unable to Assess Orientation:  Oriented to Self Alcohol / Substance use:  Not  Applicable Psych involvement (Current and /or in the community):  No (Comment)  Discharge Needs  Concerns to be addressed:  Denies Needs/Concerns at this time Readmission within the last 30 days:  No Current discharge risk:  None Barriers to Discharge:  No Barriers Identified   Avigdor Dollar, LCSW 04/26/2015, 7:46 AM

## 2015-04-26 NOTE — Care Management Important Message (Deleted)
Important Message  Patient Details  Name: [name] MRN: [id number] Date of Birth: 1936-08-29   Medicare Important Message Given:  Yes-second notification given    Delorse Lek 04/26/2015, 2:30 PM

## 2015-04-26 NOTE — Evaluation (Signed)
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 fib on Coumadin, esophageal stricture/stenosis, irritable bowel syndrome, diverticulosis, and HLD who is a resident of skilled nursing facility. She was brought to [facility] emergency department via EMS after an episode of witnessed generalized seizure at the facility. Per emergency room doctor, patient had another generalized tonic-clonic seizure while in emergency department.  Clinical Impression  Patient evaluated for level of care and mobility. Patent demonstrates significant deficits in mobility and function as indicated below. Patient also with baseline cognitive deficits and expressive deficits limiting assessment. Patient was able to initiate and follow some simple commands during session. At this time, recommend return to residential SNF. Will defer all further PT to next venue.    Follow Up Recommendations SNF (return to SNF)    Equipment Recommendations  None recommended by PT    Recommendations for Other Services       Precautions / Restrictions Restrictions Weight Bearing Restrictions: No      Mobility  Bed Mobility Overal bed mobility: Needs Assistance;+2 for physical assistance Bed Mobility: Rolling;Supine to Sit Rolling: Max assist   Supine to sit: HOB elevated (attempted x2 unable to perform safely with 1 person)     General bed mobility comments: patient was able to follow commands to initiate roll to sidelying but required maximal physical assist to complete tasks.   Transfers                    Ambulation/Gait                Stairs            Wheelchair  Mobility    Modified Rankin (Stroke Patients Only)       Balance                                             Pertinent Vitals/Pain Pain Assessment: Faces Faces Pain Scale: No hurt    Home Living Family/patient expects to be discharged to:: Skilled nursing facility                      Prior Function Level of Independence: Needs assistance               Hand Dominance        Extremity/Trunk Assessment   Upper Extremity Assessment: Generalized weakness;Difficult to assess due to impaired cognition           Lower Extremity Assessment: Generalized weakness;Difficult to assess due to impaired cognition         Communication   Communication: Expressive difficulties;Receptive difficulties  Cognition Arousal/Alertness: Awake/alert Behavior During Therapy: WFL for tasks assessed/performed Overall Cognitive Status: History of cognitive impairments - at baseline                      General Comments General comments (skin integrity, edema, etc.): Patient assisted with hygiene, during assist, patient noted to have significant amount of yeast. Hygiene and pericare complete with nsg tech, nsg tech to notify nurse  Exercises        Assessment/Plan    PT Assessment All further PT needs can be met in the next venue of care  PT Diagnosis Generalized weakness;Hemiplegia non-dominant side;Altered mental status   PT Problem List Decreased strength;Decreased range of motion;Decreased activity tolerance;Decreased balance;Decreased mobility;Decreased cognition  PT Treatment Interventions     PT Goals (Current goals can be found in the Care Plan section) Acute Rehab PT Goals PT Goal Formulation: Patient unable to participate in goal setting    Frequency     Barriers to discharge        Co-evaluation               End of Session   Activity Tolerance: Patient limited by fatigue Patient left: in bed;with call bell/phone  within reach;with bed alarm set Nurse Communication: Mobility status;Need for lift equipment         Time: [phone] PT Time Calculation (min) (ACUTE ONLY): 16 min   Charges:   PT Evaluation $Initial PT Evaluation Tier I: 1 Procedure     PT G CodesDuncan Dull May 23, 2015, 9:26 AM Alben Deeds, PT DPT  [phone]

## 2015-04-26 NOTE — Progress Notes (Signed)
Discharge orders received. 2 IVs dcd with dressings clean dry and intact. Pt to return to [facility]. Liason contacted to give report.  [facility] transported patient via stretcher at 1150.

## 2015-04-26 NOTE — Discharge Summary (Signed)
Physician Discharge Summary  [name] NAT:[id number] DOB: August 10, 1936 DOA: 04/23/2015  PCP: Cari Caraway, MD  Admit date: 04/23/2015 Discharge date: 04/26/2015  Time spent: 40 minutes  Recommendations for Outpatient Follow-up:  Seizure - Most likely Secondary to Hypertensive urgency -Continue Depacon 500 mg BID  - MRI brain; negative for acute stroke/ PRES, request -  Hypertensive urgency -Continue Clonidine scheduled 0.2 mg TID; DO NOT CHANGE BACK to PRN (Rebound HTN most likely will result)  -Cardizem 120 mg daily -Lisinopril 40 mg daily -Metoprolol 100 mg BID   Atrial fibrillation, rate controlled, chads 2 vasc at least 6 - Currently in NSR -At time of discharge therapeutic INR 2.9  - Continue Cardizem   Chronic stroke - With residual right hemiparesis and expressive aphasia - MRI brain negative acute stroke  Advanced dementia - Unknown baseline however per admission note advanced Per daughter       Discharge Diagnoses:  Active Problems:   Seizure   Hypertensive urgency   Paroxysmal atrial fibrillation   Chronic arterial ischemic stroke   Advanced dementia   Discharge Condition: Stable   Diet recommendation: Dysphagia to nectar thick fluids  Filed Weights   04/23/15 1222  Weight: 66.8 kg (147 lb 4.3 oz)    History of present illness:  79 year old female PMHx Seizures,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 fib on Coumadin, esophageal stricture/stenosis, irritable bowel syndrome, diverticulosis, HLD. She is a resident of skilled nursing facility  Brought to [facility] emergency department via EMS after an episode of witnessed generalized seizure at the facility. Per emergency room doctor, patient had another generalized tonic-clonic seizure while in emergency department. Patient is unable to provide any  history as noted above and daughter at bedside able to explain but patient has not received medication earlier this morning but believes she gets her medicines as prescribed. No reported chest pain or shortness of breath, no reported abdominal or urinary concerns, no reported fevers or chills. At baseline patient is wheelchair bound and nonverbal. Of note, patient takes Tegretol 100 mg 3 times a day. Daughter also reports that patient was recently treated for UTI and completed a course of ABX but she does not know the name of the medication. Daughter also reports patient has been having difficulty with blood pressure control.  In emergency department, patient is hemodynamically stable, initial vital signs notable for blood pressure 229/105, has gotten dose of hydralazine and blood pressure repeated, 179/90. Blood work notable for WBC 10.6, K3.4, INR 1.73. Neurologist has been consulted and [facility] asked to admit to step down unit for further evaluation. During his hospitalization patient was treated for seizures most likely brought on by uncontrolled blood pressure. Uncontrolled blood pressure mostly secondary to clonidine being used PRN instead of scheduled. Clonidine now scheduled and patient's BP much better controlled. Since BP has been better controlled negative additional seizure activity witnessed.     Consultants: Vashti Hey (neurology)   Procedure/Significant Events: 7/30 MRI brain without contrast;-Stable postoperative changes left frontal lobe. -Stable remote lacunar infarcts basal ganglia. 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   Culture 7/30 urine pending 7/30 MRSA by PCR negative    Discharge Exam: Filed Vitals:   04/25/15 1821 04/25/15 2137 04/26/15 0147 04/26/15 0525  BP: 143/67 165/64 166/71 167/71  Pulse: 72 71 74 61  Temp: 98 F (36.7 C) 98.1 F (36.7 C)  98.6 F (37 C) 97.8 F (36.6 C)  TempSrc: Oral Oral Oral Oral  Resp: 18 16 16 16    Height:      Weight:      SpO2: 95% 95% 98% 98%    General: awake alert able to answer yes and no questions, follows commands, sitting comfortably in bed eating breakfast, No acute respiratory distress Eyes: negative scleral hemorrhage ENT: Negative Runny nose, negative gingival bleeding Neck: Negative scars, masses, torticollis, lymphadenopathy, JVD Lungs: Clear to auscultation bilaterally without wheezes or crackles Cardiovascular: Irregular irregular rhythm and rate, negative murmur gallop or rub normal S1 and S2 Abdomen:negative abdominal pain, Nontender, nondistended, soft, bowel sounds positive, no rebound, no ascites, no appreciable mass Extremities: moves all extremities, right hand contracted, right foot plantar flexed. No significant cyanosis, clubbing, or edema bilateral lower extremities   Discharge Instructions     Medication List    ASK your doctor about these medications        acetaminophen 500 MG tablet  Commonly known as:  TYLENOL  Take 500 mg by mouth every 4 (four) hours as needed (pain).     atorvastatin 40 MG tablet  Commonly known as:  LIPITOR  Take 40 mg by mouth daily at 6 PM.     carbamazepine 100 MG chewable tablet  Commonly known as:  TEGRETOL  Chew 100 mg by mouth 3 (three) times daily.     Cholecalciferol 2000 UNITS Tabs  Take 4,000 Units by mouth daily.     citalopram 20 MG tablet  Commonly known as:  CELEXA  Take 20 mg by mouth daily.     cloNIDine 0.1 MG tablet  Commonly known as:  CATAPRES  Take 0.1 mg by mouth every 6 (six) hours as needed (blood pressure over 160).     diltiazem 120 MG 24 hr capsule  Commonly known as:  CARDIZEM CD  Take 120 mg by mouth daily. Take on an empty stomach     lisinopril 40 MG tablet  Commonly known as:  PRINIVIL,ZESTRIL  Take 40 mg by mouth daily.     loperamide 2 MG tablet  Commonly known as:  IMODIUM A-D  Take 2 mg by mouth as needed for diarrhea or loose stools.     LORazepam 0.5 MG  tablet  Commonly known as:  ATIVAN  Take 1 tablet (0.5 mg total) by mouth at bedtime.     metoprolol 100 MG tablet  Commonly known as:  LOPRESSOR  Take 1 tablet (100 mg total) by mouth 2 (two) times daily.     mirtazapine 15 MG tablet  Commonly known as:  REMERON  Take 15 mg by mouth at bedtime.     ondansetron 8 MG tablet  Commonly known as:  ZOFRAN  Take 8 mg by mouth every 8 (eight) hours as needed for nausea or vomiting.     oxyCODONE-acetaminophen 5-325 MG per tablet  Commonly known as:  PERCOCET/ROXICET  Take 1 tablet by mouth every 6 (six) hours as needed for moderate pain or severe pain.     oxyCODONE-acetaminophen 5-325 MG per tablet  Commonly known as:  PERCOCET  Take 1-2 tablets by mouth every 6 (six) hours as needed for moderate pain or severe pain.     PREMARIN vaginal cream  Generic drug:  conjugated estrogens  Place 1 Applicatorful vaginally at bedtime. Apply  small amount on urethra daily     PROCEL PO  Take 1 scoop by mouth 2 (two) times daily.  PROCTO-PAK 1 % Crea  Generic drug:  hydrocortisone  Apply topically at bedtime as needed (for blood in stool).     saccharomyces boulardii 250 MG capsule  Commonly known as:  FLORASTOR  Take 250 mg by mouth 2 (two) times daily.     UNABLE TO FIND  Take 120 mLs by mouth 2 (two) times daily. Med Name: Med Pass     warfarin 3 MG tablet  Commonly known as:  COUMADIN  Take 3 mg by mouth daily.       Allergies  Allergen Reactions  . Ciprofloxacin Other (See Comments)    diarrhea/yeast infection  . Codeine Other (See Comments)    unknown  . Dilantin [Phenytoin Sodium Extended]   . Levetiracetam Nausea Only    Nausea developed while on this med, worsened when dose increased.  Nausea ceased when med was d/cd.  Marland Kitchen Penicillins Hives  . Phenytoin Sodium Extended Other (See Comments)    unknown       Follow-up Information    Follow up with [facility], [place].   Specialty:  Family Medicine   Why:   Scheduled follow-up appointment with Dr. Cari Caraway one week post discharge seizure, hypertensive urgency, atrial fibrillation   Contact information:   [place] [address] [phone]        The results of significant diagnostics from this hospitalization (including imaging, microbiology, ancillary and laboratory) are listed below for reference.    Significant Diagnostic Studies: Ct Head Wo Contrast  04/23/2015   CLINICAL DATA:  History of seizure and stroke  EXAM: CT HEAD WITHOUT CONTRAST  TECHNIQUE: Contiguous axial images were obtained from the base of the skull through the vertex without intravenous contrast.  COMPARISON:  02/15/2015; 08/13/2013; brain MRI - 08/13/2013  FINDINGS: Stool sequela of left frontal craniotomy with similar appearance of resection cavity within the left frontal lobe.  Similar findings of advanced atrophy with sulcal prominence and centralized volume loss with commensurate ex vacuo dilatation of the ventricular system. Unchanged old infarct within the right thalamus (image 12, series 201). Extensive periventricular hypodensities compatible microvascular ischemic disease. Given background parenchymal abnormalities, there is no CT evidence of superimposed acute large territory infarct. No intraparenchymal extra-axial mass or hemorrhage. Unchanged size configuration of the ventricles and basilar cisterns. No midline shift.  Limited visualization the paranasal sinuses and mastoid air cells appear normal. No air-fluid levels. Regional soft tissues appear normal. No displaced calvarial fracture.  IMPRESSION: Similar postoperative and chronic changes of the brain without definitive acute intracranial process.   Electronically Signed   By: Sandi Mariscal M.D.   On: 04/23/2015 09:21   Mr Brain Wo Contrast  04/23/2015   CLINICAL DATA:  Witnessed seizure. History of seizures. Personal history of tumor resection and left frontal lobectomy.  EXAM: MRI HEAD  WITHOUT CONTRAST  TECHNIQUE: Multiplanar, multiecho pulse sequences of the brain and surrounding structures were obtained without intravenous contrast.  COMPARISON:  CT head from the same day.  MRI brain 08/13/2013.  FINDINGS: No acute infarct is present. Cystic dilation of the left lateral ventricle following resection of a left frontal lobe lesion is stable. Ex vacuo dilation of the posterior aspect of the left lateral ventricle is stable is well. Remote lacunar infarcts in the basal ganglia are stable. Diffuse white matter changes are again seen.  No acute infarct, hemorrhage, or mass lesion is present. The left internal carotid artery is occluded at the skull base without significant change. The right internal carotid  artery is patent. The posterior circulation is patent.  Globes orbits are intact. The paranasal sinuses and mastoid air cells are clear.  IMPRESSION: 1. Stable postoperative changes of the left frontal lobe. 2. Stable remote lacunar infarcts of the basal ganglia. 3. 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 4. Stable diffuse white matter disease. 5. No acute or focal lesion to explain seizures.   Electronically Signed   By: San Morelle M.D.   On: 04/23/2015 16:55   Dg Chest Port 1 View  04/23/2015   CLINICAL DATA:  Seizures this morning and altered mental status.  EXAM: PORTABLE CHEST - 1 VIEW  COMPARISON:  02/15/2015 and prior chest radiographs  FINDINGS: The cardiomediastinal silhouette is unremarkable.  There is no evidence of focal airspace disease, pulmonary edema, suspicious pulmonary nodule/mass, pleural effusion, or pneumothorax. No acute bony abnormalities are identified.  IMPRESSION: No active disease.   Electronically Signed   By: Margarette Canada M.D.   On: 04/23/2015 11:35    Microbiology: Recent Results (from the past 240 hour(s))  Urine culture     Status: None   Collection Time: 04/23/15  8:50 AM  Result Value Ref Range Status   Specimen  Description URINE, CATHETERIZED  Final   Special Requests NONE  Final   Culture NO GROWTH 1 DAY  Final   Report Status 04/24/2015 FINAL  Final  MRSA PCR Screening     Status: None   Collection Time: 04/23/15 12:44 PM  Result Value Ref Range Status   MRSA by PCR NEGATIVE NEGATIVE Final    Comment:        The GeneXpert MRSA Assay (FDA approved for NASAL specimens only), is one component of a comprehensive MRSA colonization surveillance program. It is not intended to diagnose MRSA infection nor to guide or monitor treatment for MRSA infections.      Labs: Basic Metabolic Panel:  Recent Labs Lab 04/23/15 0752 04/24/15 0228  NA 143 140  K 3.4* 3.7  CL 109 105  CO2 25 27  GLUCOSE 101* 99  BUN 19 16  CREATININE 1.00 1.00  CALCIUM 8.7* 8.8*   Liver Function Tests:  Recent Labs Lab 04/23/15 0752  AST 16  ALT 7*  ALKPHOS 85  BILITOT 0.4  PROT 5.7*  ALBUMIN 2.7*   No results for input(s): LIPASE, AMYLASE in the last 168 hours. No results for input(s): AMMONIA in the last 168 hours. CBC:  Recent Labs Lab 04/23/15 0752 04/24/15 0228 04/26/15 0706  WBC 10.6* 9.1 7.4  NEUTROABS 9.1*  --   --   HGB 12.4 12.5 12.3  HCT 38.1 38.2 38.0  MCV 102.4* 101.1* 102.7*  PLT 230 247 234   Cardiac Enzymes: No results for input(s): CKTOTAL, CKMB, CKMBINDEX, TROPONINI in the last 168 hours. BNP: BNP (last 3 results) No results for input(s): BNP in the last 8760 hours.  ProBNP (last 3 results) No results for input(s): PROBNP in the last 8760 hours.  CBG:  Recent Labs Lab 04/23/15 0944  GLUCAP 99       Signed:  Dia Crawford, MD [facility] [phone] pager

## 2015-04-27 ENCOUNTER — Encounter: Payer: Self-pay | Admitting: Adult Health

## 2015-04-27 ENCOUNTER — Non-Acute Institutional Stay (SKILLED_NURSING_FACILITY): Payer: Medicare Other | Admitting: Adult Health

## 2015-04-27 ENCOUNTER — Other Ambulatory Visit: Payer: Self-pay | Admitting: *Deleted

## 2015-04-27 DIAGNOSIS — E785 Hyperlipidemia, unspecified: Secondary | ICD-10-CM

## 2015-04-27 DIAGNOSIS — I480 Paroxysmal atrial fibrillation: Secondary | ICD-10-CM | POA: Diagnosis not present

## 2015-04-27 DIAGNOSIS — Z7901 Long term (current) use of anticoagulants: Secondary | ICD-10-CM

## 2015-04-27 DIAGNOSIS — I160 Hypertensive urgency: Secondary | ICD-10-CM

## 2015-04-27 DIAGNOSIS — Z8673 Personal history of transient ischemic attack (TIA), and cerebral infarction without residual deficits: Secondary | ICD-10-CM | POA: Diagnosis not present

## 2015-04-27 DIAGNOSIS — F0390 Unspecified dementia without behavioral disturbance: Secondary | ICD-10-CM

## 2015-04-27 DIAGNOSIS — G40909 Epilepsy, unspecified, not intractable, without status epilepticus: Secondary | ICD-10-CM

## 2015-04-27 DIAGNOSIS — F329 Major depressive disorder, single episode, unspecified: Secondary | ICD-10-CM | POA: Diagnosis not present

## 2015-04-27 DIAGNOSIS — F32A Depression, unspecified: Secondary | ICD-10-CM

## 2015-04-27 DIAGNOSIS — E43 Unspecified severe protein-calorie malnutrition: Secondary | ICD-10-CM

## 2015-04-27 DIAGNOSIS — I10 Essential (primary) hypertension: Secondary | ICD-10-CM | POA: Diagnosis not present

## 2015-04-27 DIAGNOSIS — F03C0 Unspecified dementia, severe, without behavioral disturbance, psychotic disturbance, mood disturbance, and anxiety: Secondary | ICD-10-CM

## 2015-04-27 DIAGNOSIS — Z79890 Hormone replacement therapy (postmenopausal): Secondary | ICD-10-CM

## 2015-04-27 DIAGNOSIS — I6930 Unspecified sequelae of cerebral infarction: Secondary | ICD-10-CM

## 2015-04-27 DIAGNOSIS — F419 Anxiety disorder, unspecified: Secondary | ICD-10-CM | POA: Diagnosis not present

## 2015-04-27 MED ORDER — LORAZEPAM 0.5 MG PO TABS: 0.5000 mg | ORAL_TABLET | Freq: Every day | ORAL | Status: DC

## 2015-04-27 NOTE — Progress Notes (Signed)
Patient ID: [name], female   DOB: 08-03-1936, 79 y.o.   MRN: [id number]    DATE:  04/27/2015 MRN:  [id number]  BIRTHDAY: [birth year]  Facility:  Nursing Home Location:  [place] Room Number: 5329-9  LEVEL OF CARE:  SNF [phone])  Contact Information    Name Relation Home Work Mobile   Feagans,Kim Daughter [phone]  [phone]   Karan, Inclan [phone]  [phone]   [place] Spouse [phone]         Chief Complaint  Patient presents with  . Hospitalization Follow-up    Seizure, hypertensive urgency, atrial fibrillation, chronic stroke, dementia, hyperlipidemia, depression, HRT, anxiety, long-term use of anticoagulation and protein calorie malnutrition    HISTORY OF PRESENT ILLNESS:  This is a 79 year old female who has been re-admitted to [facility] on 04/26/15 from [facility]. 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 She had seizure and was brought to ED. She was treated for seizures which is thought to be likely from uncontrolled blood pressure. Clonidine has been scheduled routinely instead of PRN.   PAST MEDICAL HISTORY:  Past Medical History  Diagnosis Date  . Depressive disorder, not elsewhere classified   . Atrial fibrillation   . Unspecified transient cerebral ischemia   . Unspecified cerebral artery occlusion with cerebral infarction   . Unspecified essential hypertension   . Diverticulosis of colon (without mention of hemorrhage)   . Stricture and stenosis of esophagus   . Seizure disorder   . GERD (gastroesophageal reflux disease)   . IBS (irritable bowel syndrome)   . Stroke   . CHF (congestive heart failure)   . Anemia   . Hyperlipidemia   .  Arrhythmia   . Hx: UTI (urinary tract infection)   . Seizures      CURRENT MEDICATIONS: Reviewed  Patient's Medications  New Prescriptions   No medications on file  Previous Medications   ACETAMINOPHEN (TYLENOL) 500 MG TABLET    Take 500 mg by mouth every 4 (four) hours as needed (pain).    ATORVASTATIN (LIPITOR) 40 MG TABLET    Take 40 mg by mouth daily at 6 PM.    CHOLECALCIFEROL 2000 UNITS TABS    Take 4,000 Units by mouth daily.   CITALOPRAM (CELEXA) 20 MG TABLET    Take 20 mg by mouth daily.   CLONIDINE (CATAPRES) 0.2 MG TABLET    Take 1 tablet (0.2 mg total) by mouth 3 (three) times daily.   CONJUGATED ESTROGENS (PREMARIN) VAGINAL CREAM    Place 1 Applicatorful vaginally at bedtime. Apply  small amount on urethra daily   DILTIAZEM (CARDIZEM CD) 120 MG 24 HR CAPSULE    Take 120 mg by mouth daily. Take on an empty stomach   FOOD THICKENER (THICK IT) POWD    Take 1 g by mouth as needed (as needed to thinken liquids).   HYDROCORTISONE (PROCTO-PAK) 1 % CREA    Apply topically at bedtime as needed (for blood in stool).   LISINOPRIL (PRINIVIL,ZESTRIL) 40 MG TABLET    Take 40 mg by mouth daily.    LOPERAMIDE (IMODIUM A-D) 2 MG TABLET    Take 2 mg by mouth as needed for diarrhea or loose stools.   LORAZEPAM (ATIVAN) 0.5 MG TABLET  Take 1 tablet (0.5 mg total) by mouth at bedtime.   METOPROLOL (LOPRESSOR) 100 MG TABLET    Take 1 tablet (100 mg total) by mouth 2 (two) times daily.   MIRTAZAPINE (REMERON) 15 MG TABLET    Take 15 mg by mouth at bedtime.   ONDANSETRON (ZOFRAN) 8 MG TABLET    Take 8 mg by mouth every 8 (eight) hours as needed for nausea or vomiting.   OXYCODONE-ACETAMINOPHEN (PERCOCET) 5-325 MG PER TABLET    Take 1-2 tablets by mouth every 6 (six) hours as needed for moderate pain or severe pain.   PROTEIN (PROCEL PO)    Take 1 scoop by mouth 2 (two) times daily.    SACCHAROMYCES BOULARDII (FLORASTOR) 250 MG CAPSULE    Take 250 mg by mouth 2 (two) times daily.    UNABLE TO FIND     Take 120 mLs by mouth 2 (two) times daily. Med Name: Med Pass   VALPROIC ACID (DEPAKENE) 250 MG CAPSULE    Take 2 capsules (500 mg total) by mouth 2 (two) times daily.   WARFARIN (COUMADIN) 3 MG TABLET    Take 3 mg by mouth daily.  Modified Medications   No medications on file  Discontinued Medications   No medications on file     Allergies  Allergen Reactions  . Ciprofloxacin Other (See Comments)    diarrhea/yeast infection  . Codeine Other (See Comments)    unknown  . Dilantin [Phenytoin Sodium Extended]   . Levetiracetam Nausea Only    Nausea developed while on this med, worsened when dose increased.  Nausea ceased when med was d/cd.  Marland Kitchen Penicillins Hives  . Phenytoin Sodium Extended Other (See Comments)    unknown     REVIEW OF SYSTEMS:  Unable to obtain due to advanced dementia and expressive aphasia   PHYSICAL EXAMINATION  GENERAL APPEARANCE: Normal body habitus EYES: Lids open and close normally. No blepharitis, entropion or ectropion. PERRL. Conjunctivae are clear and sclerae are white. Lenses are without opacity EARS: Pinnae are normal. Patient hears normal voice tunes of the examiner MOUTH and THROAT: Lips are without lesions. Oral mucosa is moist and without lesions. Tongue is normal in shape, size, and color and without lesions NECK: supple, trachea midline, no neck masses, no thyroid tenderness, no thyromegaly LYMPHATICS: no LAN in the neck, no supraclavicular LAN RESPIRATORY: breathing is even & unlabored, BS CTAB CARDIAC: Irregularly irregular rhythm, no murmur,no extra heart sounds, no edema GI: abdomen soft, normal BS, no masses, no tenderness, no hepatomegaly, no splenomegaly EXTREMITIES:  right hand 3rd, 4th and 5th fingers are contracted, left hand fingers all contracted, not moving ; right hemiparesis PSYCHIATRIC: Alert to self. Affect and behavior are appropriate  LABS/RADIOLOGY: Labs reviewed: Basic Metabolic Panel:  Recent Labs  04/23/15 0752  04/24/15 0228 04/26/15 0706  NA 143 140 139  K 3.4* 3.7 3.9  CL 109 105 104  CO2 25 27 26   GLUCOSE 101* 99 78  BUN 19 16 25*  CREATININE 1.00 1.00 1.16*  CALCIUM 8.7* 8.8* 8.2*   Liver Function Tests:  Recent Labs  02/18/15 0511 04/23/15 0752 04/26/15 0706  AST 15 16 13*  ALT 10* 7* 7*  ALKPHOS 59 85 68  BILITOT 0.6 0.4 0.4  PROT 6.3* 5.7* 5.2*  ALBUMIN 3.0* 2.7* 2.4*   CBC:  Recent Labs  02/15/15 0825 02/17/15 1324  04/23/15 0752 04/24/15 0228 04/26/15 0706  WBC 9.6 12.0*  < > 10.6* 9.1 7.4  NEUTROABS 7.9* 9.6*  --  9.1*  --   --   HGB 12.5 12.1  < > 12.4 12.5 12.3  HCT 37.6 37.8  < > 38.1 38.2 38.0  MCV 98.2 103.3*  < > 102.4* 101.1* 102.7*  PLT 204 256  < > 230 247 234  < > = values in this interval not displayed.  CBG:  Recent Labs  04/23/15 0944  GLUCAP 99    Ct Head Wo Contrast  04/23/2015   CLINICAL DATA:  History of seizure and stroke  EXAM: CT HEAD WITHOUT CONTRAST  TECHNIQUE: Contiguous axial images were obtained from the base of the skull through the vertex without intravenous contrast.  COMPARISON:  02/15/2015; 08/13/2013; brain MRI - 08/13/2013  FINDINGS: Stool sequela of left frontal craniotomy with similar appearance of resection cavity within the left frontal lobe.  Similar findings of advanced atrophy with sulcal prominence and centralized volume loss with commensurate ex vacuo dilatation of the ventricular system. Unchanged old infarct within the right thalamus (image 12, series 201). Extensive periventricular hypodensities compatible microvascular ischemic disease. Given background parenchymal abnormalities, there is no CT evidence of superimposed acute large territory infarct. No intraparenchymal extra-axial mass or hemorrhage. Unchanged size configuration of the ventricles and basilar cisterns. No midline shift.  Limited visualization the paranasal sinuses and mastoid air cells appear normal. No air-fluid levels. Regional soft tissues appear  normal. No displaced calvarial fracture.  IMPRESSION: Similar postoperative and chronic changes of the brain without definitive acute intracranial process.   Electronically Signed   By: Sandi Mariscal M.D.   On: 04/23/2015 09:21   Mr Brain Wo Contrast  04/23/2015   CLINICAL DATA:  Witnessed seizure. History of seizures. Personal history of tumor resection and left frontal lobectomy.  EXAM: MRI HEAD WITHOUT CONTRAST  TECHNIQUE: Multiplanar, multiecho pulse sequences of the brain and surrounding structures were obtained without intravenous contrast.  COMPARISON:  CT head from the same day.  MRI brain 08/13/2013.  FINDINGS: No acute infarct is present. Cystic dilation of the left lateral ventricle following resection of a left frontal lobe lesion is stable. Ex vacuo dilation of the posterior aspect of the left lateral ventricle is stable is well. Remote lacunar infarcts in the basal ganglia are stable. Diffuse white matter changes are again seen.  No acute infarct, hemorrhage, or mass lesion is present. The left internal carotid artery is occluded at the skull base without significant change. The right internal carotid artery is patent. The posterior circulation is patent.  Globes orbits are intact. The paranasal sinuses and mastoid air cells are clear.  IMPRESSION: 1. Stable postoperative changes of the left frontal lobe. 2. Stable remote lacunar infarcts of the basal ganglia. 3. 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 4. Stable diffuse white matter disease. 5. No acute or focal lesion to explain seizures.   Electronically Signed   By: San Morelle M.D.   On: 04/23/2015 16:55   Dg Chest Port 1 View  04/23/2015   CLINICAL DATA:  Seizures this morning and altered mental status.  EXAM: PORTABLE CHEST - 1 VIEW  COMPARISON:  02/15/2015 and prior chest radiographs  FINDINGS: The cardiomediastinal silhouette is unremarkable.  There is no evidence of focal airspace disease, pulmonary edema,  suspicious pulmonary nodule/mass, pleural effusion, or pneumothorax. No acute bony abnormalities are identified.  IMPRESSION: No active disease.   Electronically Signed   By: Margarette Canada M.D.   On: 04/23/2015 11:35    ASSESSMENT/PLAN:  Seizure -  continue Tegretol 100 mg 1 tab by mouth 3 times a day and Depacon 500 mg by mouth twice a day  Hypertensive urgency - continue Catapres 0.2 mg by mouth 3 times a day, diltiazem 120 mg 24-hour 1 capsule by mouth daily, lisinopril 40 mg 1 tab by mouth daily and Lopressor 100 mg 1 tab by mouth twice a day  Atrial fibrillation - rate controlled; continue Coumadin, diltiazem 120 mg 24-hour 1 capsule by mouth daily and metoprolol 100 mg 1 tab by mouth twice a day  Chronic stroke - MRI of brain negative for acute stroke; right hemiparesis and expressive aphasia; for rehabilitation  Dementia - advanced; continue supportive care  Hyperlipidemia - continue Lipitor 40 mg by mouth every 6 p.m.  Depression -  mood is stable; continue Celexa 20 mg by mouth daily  HRT - continue Premarin vaginal cream 1 applicator full vaginally daily at bedtime  Anxiety - continue Ativan 0.5 mg by mouth daily at bedtime  Long-term use of anticoagulation - INR 2.8 (Coumadin was held X 1 day due to INR 3.4), therapeutic; dcerase Coumadin to 3 mg PO Q D and check INR on 04/29/15  Protein calorie malnutrition, severe - albumin 2.4; RD consult    Goals of care:  Short-term rehabilitation then long-term care    [facility], NP [facility] [phone]

## 2015-04-29 ENCOUNTER — Non-Acute Institutional Stay (SKILLED_NURSING_FACILITY): Payer: Medicare Other | Admitting: Internal Medicine

## 2015-04-29 DIAGNOSIS — F32A Depression, unspecified: Secondary | ICD-10-CM

## 2015-04-29 DIAGNOSIS — F329 Major depressive disorder, single episode, unspecified: Secondary | ICD-10-CM

## 2015-04-29 DIAGNOSIS — R5381 Other malaise: Secondary | ICD-10-CM | POA: Diagnosis not present

## 2015-04-29 DIAGNOSIS — E46 Unspecified protein-calorie malnutrition: Secondary | ICD-10-CM

## 2015-04-29 DIAGNOSIS — Z8673 Personal history of transient ischemic attack (TIA), and cerebral infarction without residual deficits: Secondary | ICD-10-CM

## 2015-04-29 DIAGNOSIS — I10 Essential (primary) hypertension: Secondary | ICD-10-CM | POA: Diagnosis not present

## 2015-04-29 DIAGNOSIS — G4089 Other seizures: Secondary | ICD-10-CM | POA: Diagnosis not present

## 2015-04-29 DIAGNOSIS — B373 Candidiasis of vulva and vagina: Secondary | ICD-10-CM | POA: Diagnosis not present

## 2015-04-29 DIAGNOSIS — B3731 Acute candidiasis of vulva and vagina: Secondary | ICD-10-CM

## 2015-04-29 DIAGNOSIS — F0390 Unspecified dementia without behavioral disturbance: Secondary | ICD-10-CM | POA: Diagnosis not present

## 2015-04-29 DIAGNOSIS — I480 Paroxysmal atrial fibrillation: Secondary | ICD-10-CM | POA: Diagnosis not present

## 2015-04-29 DIAGNOSIS — G40409 Other generalized epilepsy and epileptic syndromes, not intractable, without status epilepticus: Secondary | ICD-10-CM

## 2015-04-29 DIAGNOSIS — F03C0 Unspecified dementia, severe, without behavioral disturbance, psychotic disturbance, mood disturbance, and anxiety: Secondary | ICD-10-CM

## 2015-04-29 NOTE — Progress Notes (Signed)
Patient ID: [name], female   DOB: 1936/09/04, 80 y.o.   MRN: [id number]       [place]  PCP: Cari Caraway, MD  Code Status: Full Code   Allergies  Allergen Reactions  . Ciprofloxacin Other (See Comments)    diarrhea/yeast infection  . Codeine Other (See Comments)    unknown  . Dilantin [Phenytoin Sodium Extended]   . Levetiracetam Nausea Only    Nausea developed while on this med, worsened when dose increased.  Nausea ceased when med was d/cd.  Marland Kitchen Penicillins Hives  . Phenytoin Sodium Extended Other (See Comments)    unknown    Chief Complaint  Patient presents with  . Readmit To SNF     HPI:  79 year old patient is here for short term rehabilitation post hospital re-admission from 04/23/15-04/26/15 with with generalized tonic clonic seizure and hypertensive emergency. This was thought to be from uncontrolled HTN and her clonidine was scheduled. She was discharged on 0.2 mg tid of clonidine but is currently getting 0.1 mg tid as verified by her nurse. She is seen in her room. She is under total care in the facility at present. She needs assistance with feeding, will give a one word yes/ no answer with nods. She needs cueing and redirection. She does not participate fully in HPI and ROS. As per staff, her bp reading has been stable.   Review of Systems:  Unable to obtain.  Past Medical History  Diagnosis Date  . Depressive disorder, not elsewhere classified   . Atrial fibrillation   . Unspecified transient cerebral ischemia   . Unspecified cerebral artery occlusion with cerebral infarction   . Unspecified essential hypertension   . Diverticulosis of colon (without mention of hemorrhage)   . Stricture and stenosis of esophagus   . Seizure disorder   . GERD (gastroesophageal reflux disease)   . IBS (irritable bowel syndrome)   . Stroke   . CHF (congestive heart failure)   . Anemia   . Hyperlipidemia   . Arrhythmia   . Hx: UTI (urinary tract  infection)   . Seizures    Past Surgical History  Procedure Laterality Date  . Hemorrhoid surgery    . Benign braintumor      Removal  . Flexible sigmoidoscopy  09/02/2011    Procedure: FLEXIBLE SIGMOIDOSCOPY;  Surgeon: Zenovia Jarred, MD;  Location: [facility]COPY;  Service: Gastroenterology;  Laterality: N/A;  . Hip arthroplasty  11/26/2011    Procedure: ARTHROPLASTY BIPOLAR HIP;  Surgeon: Alta Corning, MD;  Location: [facility] ORS;  Service: Orthopedics;  Laterality: Right;   Social History:   reports that she has never smoked. She has never used smokeless tobacco. She reports that she does not drink alcohol or use illicit drugs.  Family History  Problem Relation Age of Onset  . Heart disease Mother   . Colon cancer Neg Hx     Medications: Patient's Medications  New Prescriptions   No medications on file  Previous Medications   ACETAMINOPHEN (TYLENOL) 500 MG TABLET    Take 500 mg by mouth every 4 (four) hours as needed (pain).    ATORVASTATIN (LIPITOR) 40 MG TABLET    Take 40 mg by mouth daily at 6 PM.    CHOLECALCIFEROL 2000 UNITS TABS    Take 4,000 Units by mouth daily.   CITALOPRAM (CELEXA) 20 MG TABLET    Take 20 mg by mouth daily.   CLONIDINE (CATAPRES) 0.2 MG TABLET  Take 1 tablet (0.2 mg total) by mouth 3 (three) times daily.   CONJUGATED ESTROGENS (PREMARIN) VAGINAL CREAM    Place 1 Applicatorful vaginally at bedtime. Apply  small amount on urethra daily   DILTIAZEM (CARDIZEM CD) 120 MG 24 HR CAPSULE    Take 120 mg by mouth daily. Take on an empty stomach   FOOD THICKENER (THICK IT) POWD    Take 1 g by mouth as needed (as needed to thinken liquids).   HYDROCORTISONE (PROCTO-PAK) 1 % CREA    Apply topically at bedtime as needed (for blood in stool).   LISINOPRIL (PRINIVIL,ZESTRIL) 40 MG TABLET    Take 40 mg by mouth daily.    LOPERAMIDE (IMODIUM A-D) 2 MG TABLET    Take 2 mg by mouth as needed for diarrhea or loose stools.   LORAZEPAM (ATIVAN) 0.5 MG TABLET    Take 1 tablet (0.5 mg  total) by mouth at bedtime.   METOPROLOL (LOPRESSOR) 100 MG TABLET    Take 1 tablet (100 mg total) by mouth 2 (two) times daily.   MIRTAZAPINE (REMERON) 15 MG TABLET    Take 15 mg by mouth at bedtime.   ONDANSETRON (ZOFRAN) 8 MG TABLET    Take 8 mg by mouth every 8 (eight) hours as needed for nausea or vomiting.   OXYCODONE-ACETAMINOPHEN (PERCOCET) 5-325 MG PER TABLET    Take 1-2 tablets by mouth every 6 (six) hours as needed for moderate pain or severe pain.   PROTEIN (PROCEL PO)    Take 1 scoop by mouth 2 (two) times daily.    SACCHAROMYCES BOULARDII (FLORASTOR) 250 MG CAPSULE    Take 250 mg by mouth 2 (two) times daily.    UNABLE TO FIND    Take 120 mLs by mouth 2 (two) times daily. Med Name: Med Pass   VALPROIC ACID (DEPAKENE) 250 MG CAPSULE    Take 2 capsules (500 mg total) by mouth 2 (two) times daily.   WARFARIN (COUMADIN) 3 MG TABLET    Take 3 mg by mouth daily.  Modified Medications   No medications on file  Discontinued Medications   No medications on file     Physical Exam: Filed Vitals:   04/29/15 1307  BP: 136/89  Pulse: 65  Temp: 97.2 F (36.2 C)  Resp: 16  Height: 5\' 2"  (1.575 m)  Weight: 144 lb 3.2 oz (65.409 kg)  SpO2: 96%    General- elderly female, frail, in no acute distress Head- normocephalic, atraumatic Throat- moist mucus membrane Eyes- PERRLA, EOMI, no pallor, no icterus, no discharge, normal conjunctiva, normal sclera Neck- no cervical lymphadenopathy Cardiovascular- irregular heart rate, no murmurs, palpable dorsalis pedis and radial pulses, no leg edema Respiratory- bilateral clear to auscultation, no wheeze, no rhonchi, no crackles, no use of accessory muscles Abdomen- bowel sounds present, soft, non tender Musculoskeletal- hand brace in both hands, right sided hemiparesis Neurological- aphasic, not following commands Skin- warm and dry   Labs reviewed: Basic Metabolic Panel:  Recent Labs  04/23/15 0752 04/24/15 0228 04/26/15 0706  NA  143 140 139  K 3.4* 3.7 3.9  CL 109 105 104  CO2 25 27 26   GLUCOSE 101* 99 78  BUN 19 16 25*  CREATININE 1.00 1.00 1.16*  CALCIUM 8.7* 8.8* 8.2*   Liver Function Tests:  Recent Labs  02/18/15 0511 04/23/15 0752 04/26/15 0706  AST 15 16 13*  ALT 10* 7* 7*  ALKPHOS 59 85 68  BILITOT 0.6 0.4 0.4  PROT 6.3* 5.7* 5.2*  ALBUMIN 3.0* 2.7* 2.4*   No results for input(s): LIPASE, AMYLASE in the last 8760 hours. No results for input(s): AMMONIA in the last 8760 hours. CBC:  Recent Labs  02/15/15 0825 02/17/15 1324  04/23/15 0752 04/24/15 0228 04/26/15 0706  WBC 9.6 12.0*  < > 10.6* 9.1 7.4  NEUTROABS 7.9* 9.6*  --  9.1*  --   --   HGB 12.5 12.1  < > 12.4 12.5 12.3  HCT 37.6 37.8  < > 38.1 38.2 38.0  MCV 98.2 103.3*  < > 102.4* 101.1* 102.7*  PLT 204 256  < > 230 247 234  < > = values in this interval not displayed.   Assessment/Plan  Physical deconditioning Will have her work with physical therapy to help with muscle strengthening exercises.fall precautions. Skin care.   Seizure disorder Continue depacon 500 mg bid with tegretol 100 mg tid and monitor for now  HTN bp stable. Was written for clonidine 0.2 mg tid for for unclear reason has been recieveing 0.1 mg tid. Also on diltiazem 120 mg daily, lopressor 100 mg bid and lisinopril 40 mg daily. Continue clonidine 0.1 mg tid for now. Check bp q shift for 1 week and if average bp reading > 140/90, adjust dosing of clonidine further  Protein calorie malnutrition To continue assistance with feeding, encourage po intake. Continue remeron 15 mg daily, medpass supplement and ground diet. Continue aspiration precautions  Vaginal candidiasis Continue and complete course of clotrimazole cream for now and provide perineal care  afib Rate controlled, continue current regimen cardizem and lopressor for rate control with coumadin for anticoagulation  CVA with right weakness Will have patient work with PT as tolerated to regain  strength and restore function.  Fall precautions are in place. cotninue bp medication, lipitor  Advanced Dementia continue supportive care, assistance with ADLs, fall precautions, pressure ulcer prophylaxis  Depression Continue celexa 20 mg daily   Goals of care: short term rehabilitation   Family/ staff Communication: reviewed care plan with patient and nursing supervisor    Blanchie Serve, MD  [place] [phone] (Monday-Friday 8 am - 5 pm) [phone] (afterhours)

## 2015-05-04 ENCOUNTER — Non-Acute Institutional Stay (SKILLED_NURSING_FACILITY): Payer: Medicare Other | Admitting: Adult Health

## 2015-05-04 ENCOUNTER — Encounter: Payer: Self-pay | Admitting: Adult Health

## 2015-05-04 DIAGNOSIS — I10 Essential (primary) hypertension: Secondary | ICD-10-CM | POA: Diagnosis not present

## 2015-05-04 NOTE — Progress Notes (Signed)
Patient ID: [name], female   DOB: 1936/02/06, 79 y.o.   MRN: [id number] Subjective:     Indication: atrial fibrillation Bleeding signs/symptoms: None Thromboembolic signs/symptoms: None  Missed Coumadin doses: None Medication changes: no Dietary changes: no Bacterial/viral infection: no Other concerns: no     Review of Systems A comprehensive review of systems was negative.   Objective:    INR Today: 1.3 Current dose:   Coumadin 2 mg PO daily  Assessment:    Subtherapeutic INR for goal of 2-3   Plan:    1. New dose: Coumadin 5 mg PO X 1 today the Coumadin 3.5 mg PO Q D starting  04/20/15 2. Next INR:  04/22/15

## 2015-05-04 NOTE — Progress Notes (Signed)
Patient ID: [name] [name] [name], female   DOB: [birth year], 79 y.o.   MRN: [id number]    DATE:  05/04/2015 MRN:  [id number]  BIRTHDAY: September 03, 1936  Facility:  [facility]ursing Home Location:  [place] Room Number: 9449-6  LEVEL OF CARE:  SNF [phone])  Contact Information    Name Relation Home Work Mobile   [name] [name] [name] Daughter [phone]  [phone]   [name], [name] [name] [phone]  [phone]   [place] Spouse [phone]         Chief Complaint  Patient presents with  . Acute Visit    Hypertension    HISTORY OF PRESENT ILLNESS:  This is a 79 year old female who was noted to have elevated BPs:  164/79, 177/84, 162/100 and 181/81. No seizure episode noted.  PAST MEDICAL HISTORY:  Past Medical History  Diagnosis Date  . Depressive disorder, not elsewhere classified   . Atrial fibrillation   . Unspecified transient cerebral ischemia   . Unspecified cerebral artery occlusion with cerebral infarction   . Unspecified essential hypertension   . Diverticulosis of colon (without mention of hemorrhage)   . Stricture and stenosis of esophagus   . Seizure disorder   . GERD (gastroesophageal reflux disease)   . IBS (irritable bowel syndrome)   . Stroke   . CHF (congestive heart failure)   . Anemia   . Hyperlipidemia   . Arrhythmia   . Hx: UTI (urinary tract infection)   . Seizures      CURRENT MEDICATIONS: Reviewed  Patient's Medications  New Prescriptions   No medications on file  Previous Medications   ACETAMINOPHEN (TYLENOL) 500 MG TABLET    Take 500 mg by mouth every 4 (four) hours as needed (pain).    ATORVASTATIN (LIPITOR) 40 MG TABLET    Take 40 mg by mouth daily at 6 PM.    CHOLECALCIFEROL 2000 UNITS TABS    Take 4,000 Units by mouth daily.   CITALOPRAM (CELEXA) 20 MG TABLET    Take 20 mg by mouth daily.   CLONIDINE (CATAPRES) 0.2 MG TABLET    Take 1 tablet (0.2 mg total) by mouth 3 (three) times daily.   CONJUGATED ESTROGENS (PREMARIN)  VAGINAL CREAM    Place 1 Applicatorful vaginally at bedtime. Apply  small amount on urethra daily   DILTIAZEM (CARDIZEM CD) 120 MG 24 HR CAPSULE    Take 120 mg by mouth daily. Take on an empty stomach   FOOD THICKENER (THICK IT) POWD    Take 1 g by mouth as needed (as needed to thinken liquids).   HYDROCORTISONE (PROCTO-PAK) 1 % CREA    Apply topically at bedtime as needed (for blood in stool).   LISINOPRIL (PRINIVIL,ZESTRIL) 40 MG TABLET    Take 40 mg by mouth daily.    LOPERAMIDE (IMODIUM A-D) 2 MG TABLET    Take 2 mg by mouth as needed for diarrhea or loose stools.   LORAZEPAM (ATIVAN) 0.5 MG TABLET    Take 1 tablet (0.5 mg total) by mouth at bedtime.   METOPROLOL (LOPRESSOR) 100 MG TABLET    Take 1 tablet (100 mg total) by mouth 2 (two) times daily.   MIRTAZAPINE (REMERON) 15 MG TABLET    Take 15 mg by mouth at bedtime.   ONDANSETRON (ZOFRAN) 8 MG TABLET    Take 8 mg by mouth every 8 (eight) hours as needed for nausea or vomiting.   OXYCODONE-ACETAMINOPHEN (PERCOCET) 5-325 MG PER TABLET    Take 1-2 tablets  by mouth every 6 (six) hours as needed for moderate pain or severe pain.   PROTEIN (PROCEL PO)    Take 1 scoop by mouth 2 (two) times daily.    SACCHAROMYCES BOULARDII (FLORASTOR) 250 MG CAPSULE    Take 250 mg by mouth 2 (two) times daily.    UNABLE TO FIND    Take 120 mLs by mouth 2 (two) times daily. Med Name: Med Pass   VALPROIC ACID (DEPAKENE) 250 MG CAPSULE    Take 2 capsules (500 mg total) by mouth 2 (two) times daily.   WARFARIN (COUMADIN) 3 MG TABLET    Take 3 mg by mouth daily.  Modified Medications   No medications on file  Discontinued Medications   No medications on file     Allergies  Allergen Reactions  . Ciprofloxacin Other (See Comments)    diarrhea/yeast infection  . Codeine Other (See Comments)    unknown  . Dilantin [Phenytoin Sodium Extended]   . Levetiracetam Nausea Only    Nausea developed while on this med, worsened when dose increased.  Nausea ceased when  med was d/cd.  Marland Kitchen Penicillins Hives  . Phenytoin Sodium Extended Other (See Comments)    unknown     REVIEW OF SYSTEMS:  Unable to obtain due to advanced dementia and expressive aphasia   PHYSICAL EXAMINATION  GENERAL APPEARANCE: Normal body habitus EYES: Lids open and close normally. No blepharitis, entropion or ectropion. PERRL. Conjunctivae are clear and sclerae are white. Lenses are without opacity EARS: Pinnae are normal. Patient hears normal voice tunes of the examiner NECK: supple, trachea midline, no neck masses, no thyroid tenderness, no thyromegaly LYMPHATICS: no LAN in the neck, no supraclavicular LAN RESPIRATORY: breathing is even & unlabored, BS CTAB CARDIAC: Irregularly irregular rhythm, no murmur,no extra heart sounds, no edema GI: abdomen soft, normal BS, no masses, no tenderness, no hepatomegaly, no splenomegaly EXTREMITIES:  right hand 3rd, 4th and 5th fingers are contracted, left hand fingers all contracted, not moving ; right hemiparesis PSYCHIATRIC: Alert to self. Affect and behavior are appropriate  LABS/RADIOLOGY: Labs reviewed: Basic Metabolic Panel:  Recent Labs  04/23/15 0752 04/24/15 0228 04/26/15 0706  NA 143 140 139  K 3.4* 3.7 3.9  CL 109 105 104  CO2 25 27 26   GLUCOSE 101* 99 78  BUN 19 16 25*  CREATININE 1.00 1.00 1.16*  CALCIUM 8.7* 8.8* 8.2*   Liver Function Tests:  Recent Labs  02/18/15 0511 04/23/15 0752 04/26/15 0706  AST 15 16 13*  ALT 10* 7* 7*  ALKPHOS 59 85 68  BILITOT 0.6 0.4 0.4  PROT 6.3* 5.7* 5.2*  ALBUMIN 3.0* 2.7* 2.4*   CBC:  Recent Labs  02/15/15 0825 02/17/15 1324  04/23/15 0752 04/24/15 0228 04/26/15 0706  WBC 9.6 12.0*  < > 10.6* 9.1 7.4  NEUTROABS 7.9* 9.6*  --  9.1*  --   --   HGB 12.5 12.1  < > 12.4 12.5 12.3  HCT 37.6 37.8  < > 38.1 38.2 38.0  MCV 98.2 103.3*  < > 102.4* 101.1* 102.7*  PLT 204 256  < > 230 247 234  < > = values in this interval not displayed.  CBG:  Recent Labs   04/23/15 0944  GLUCAP 99    Ct Head Wo Contrast  04/23/2015   CLINICAL DATA:  History of seizure and stroke  EXAM: CT HEAD WITHOUT CONTRAST  TECHNIQUE: Contiguous axial images were obtained from the base of the skull through  the vertex without intravenous contrast.  COMPARISON:  02/15/2015; 08/13/2013; brain MRI - 08/13/2013  FINDINGS: Stool sequela of left frontal craniotomy with similar appearance of resection cavity within the left frontal lobe.  Similar findings of advanced atrophy with sulcal prominence and centralized volume loss with commensurate ex vacuo dilatation of the ventricular system. Unchanged old infarct within the right thalamus (image 12, series 201). Extensive periventricular hypodensities compatible microvascular ischemic disease. Given background parenchymal abnormalities, there is no CT evidence of superimposed acute large territory infarct. No intraparenchymal extra-axial mass or hemorrhage. Unchanged size configuration of the ventricles and basilar cisterns. No midline shift.  Limited visualization the paranasal sinuses and mastoid air cells appear normal. No air-fluid levels. Regional soft tissues appear normal. No displaced calvarial fracture.  IMPRESSION: Similar postoperative and chronic changes of the brain without definitive acute intracranial process.   Electronically Signed   By: Sandi Mariscal M.D.   On: 04/23/2015 09:21   Mr Brain Wo Contrast  04/23/2015   CLINICAL DATA:  Witnessed seizure. History of seizures. Personal history of tumor resection and left frontal lobectomy.  EXAM: MRI HEAD WITHOUT CONTRAST  TECHNIQUE: Multiplanar, multiecho pulse sequences of the brain and surrounding structures were obtained without intravenous contrast.  COMPARISON:  CT head from the same day.  MRI brain 08/13/2013.  FINDINGS: No acute infarct is present. Cystic dilation of the left lateral ventricle following resection of a left frontal lobe lesion is stable. Ex vacuo dilation of the  posterior aspect of the left lateral ventricle is stable is well. Remote lacunar infarcts in the basal ganglia are stable. Diffuse white matter changes are again seen.  No acute infarct, hemorrhage, or mass lesion is present. The left internal carotid artery is occluded at the skull base without significant change. The right internal carotid artery is patent. The posterior circulation is patent.  Globes orbits are intact. The paranasal sinuses and mastoid air cells are clear.  IMPRESSION: 1. Stable postoperative changes of the left frontal lobe. 2. Stable remote lacunar infarcts of the basal ganglia. 3. 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 4. Stable diffuse white matter disease. 5. No acute or focal lesion to explain seizures.   Electronically Signed   By: San Morelle M.D.   On: 04/23/2015 16:55   Dg Chest Port 1 View  04/23/2015   CLINICAL DATA:  Seizures this morning and altered mental status.  EXAM: PORTABLE CHEST - 1 VIEW  COMPARISON:  02/15/2015 and prior chest radiographs  FINDINGS: The cardiomediastinal silhouette is unremarkable.  There is no evidence of focal airspace disease, pulmonary edema, suspicious pulmonary nodule/mass, pleural effusion, or pneumothorax. No acute bony abnormalities are identified.  IMPRESSION: No active disease.   Electronically Signed   By: Margarette Canada M.D.   On: 04/23/2015 11:35    ASSESSMENT/PLAN:  Hypertension - increase Clonidine to 0.2 mg 1 tab PO TID; BP/HR Q shift X 1 week    [facility], NP [employer] [phone]

## 2015-05-06 ENCOUNTER — Encounter: Payer: Self-pay | Admitting: Adult Health

## 2015-05-06 ENCOUNTER — Non-Acute Institutional Stay (SKILLED_NURSING_FACILITY): Payer: Medicare Other | Admitting: Adult Health

## 2015-05-06 DIAGNOSIS — I482 Chronic atrial fibrillation, unspecified: Secondary | ICD-10-CM

## 2015-05-06 DIAGNOSIS — I10 Essential (primary) hypertension: Secondary | ICD-10-CM

## 2015-05-06 DIAGNOSIS — Z7901 Long term (current) use of anticoagulants: Secondary | ICD-10-CM | POA: Diagnosis not present

## 2015-05-06 DIAGNOSIS — R569 Unspecified convulsions: Secondary | ICD-10-CM | POA: Diagnosis not present

## 2015-05-08 NOTE — Progress Notes (Signed)
Patient ID: [name], female   DOB: Nov 30, 1935, 79 y.o.   MRN: [id number]    DATE:  05/06/15 MRN:  [id number]  BIRTHDAY: 1936/01/04  Facility:  Nursing Home Location:  [facility] Room Number: 1740-8  LEVEL OF CARE:  SNF [phone])  Contact Information    Name Relation Home Work Mobile   Dorris,Kim Daughter [phone]  [phone]   [place], [place] [phone]  [phone]   [place] Spouse [phone]        Chief Complaint  Patient presents with  . Acute Visit    Hypertension, Paroxysmal Atrial Fibrillation, Long-term use of anticoagulant and Seizure    HISTORY OF PRESENT ILLNESS:  This is a 79 year old female who was noted to have elevated BPs inspite of a recent Clonidine dosage increase. BP today is 228/97, 199/97. No seizure episode. Nurse verbalized that patient is having difficulty swallowing Valproic acid tabs and it cannot be crushed. She takes Valproic acid for seizure. Latest INR is 2.6, therapeutic. Coumadin was held X 3 days due to supratherapeutic INR. She takes Coumadin for Paroxysmal atrial fibrillation. HR is rate-controlled.  PAST MEDICAL HISTORY:  Past Medical History  Diagnosis Date  . Depressive disorder, not elsewhere classified   . Atrial fibrillation   . Unspecified transient cerebral ischemia   . Unspecified cerebral artery occlusion with cerebral infarction   . Unspecified essential hypertension   . Diverticulosis of colon (without mention of hemorrhage)   . Stricture and stenosis of esophagus   . Seizure disorder   . GERD (gastroesophageal reflux disease)   . IBS (irritable bowel syndrome)   . Stroke   . CHF (congestive heart failure)   . Anemia   . Hyperlipidemia   . Arrhythmia   . Hx: UTI (urinary tract infection)   . Seizures      CURRENT MEDICATIONS: Reviewed  Patient's Medications  New Prescriptions   No medications on file  Previous Medications   ACETAMINOPHEN (TYLENOL) 500 MG TABLET     Take 500 mg by mouth every 4 (four) hours as needed (pain).    ATORVASTATIN (LIPITOR) 40 MG TABLET    Take 40 mg by mouth daily at 6 PM.    CHOLECALCIFEROL 2000 UNITS TABS    Take 4,000 Units by mouth daily.   CITALOPRAM (CELEXA) 20 MG TABLET    Take 20 mg by mouth daily.   CLONIDINE (CATAPRES) 0.2 MG TABLET    Take 1 tablet (0.2 mg total) by mouth 3 (three) times daily.   CONJUGATED ESTROGENS (PREMARIN) VAGINAL CREAM    Place 1 Applicatorful vaginally at bedtime. Apply  small amount on urethra daily   DILTIAZEM (CARDIZEM CD) 120 MG 24 HR CAPSULE    Take 120 mg by mouth daily. Take on an empty stomach   FOOD THICKENER (THICK IT) POWD    Take 1 g by mouth as needed (as needed to thinken liquids).   HYDROCORTISONE (PROCTO-PAK) 1 % CREA    Apply topically at bedtime as needed (for blood in stool).   LISINOPRIL (PRINIVIL,ZESTRIL) 40 MG TABLET    Take 40 mg by mouth daily.    LOPERAMIDE (IMODIUM A-D) 2 MG TABLET    Take 2 mg by mouth as needed for diarrhea or loose stools.   LORAZEPAM (ATIVAN) 0.5 MG TABLET    Take 1 tablet (0.5 mg total) by mouth at bedtime.   METOPROLOL (LOPRESSOR) 100 MG TABLET    Take 1 tablet (100 mg total) by mouth  2 (two) times daily.   MIRTAZAPINE (REMERON) 15 MG TABLET    Take 15 mg by mouth at bedtime.   ONDANSETRON (ZOFRAN) 8 MG TABLET    Take 8 mg by mouth every 8 (eight) hours as needed for nausea or vomiting.   OXYCODONE-ACETAMINOPHEN (PERCOCET) 5-325 MG PER TABLET    Take 1-2 tablets by mouth every 6 (six) hours as needed for moderate pain or severe pain.   PROTEIN (PROCEL PO)    Take 1 scoop by mouth 2 (two) times daily.    SACCHAROMYCES BOULARDII (FLORASTOR) 250 MG CAPSULE    Take 250 mg by mouth 2 (two) times daily.    UNABLE TO FIND    Take 120 mLs by mouth 2 (two) times daily. Med Name: Med Pass   VALPROIC ACID (DEPAKENE) 250 MG CAPSULE    Take 2 capsules (500 mg total) by mouth 2 (two) times daily.   WARFARIN (COUMADIN) 3 MG TABLET    Take 3 mg by mouth daily.    Modified Medications   No medications on file  Discontinued Medications   No medications on file     Allergies  Allergen Reactions  . Ciprofloxacin Other (See Comments)    diarrhea/yeast infection  . Codeine Other (See Comments)    unknown  . Dilantin [Phenytoin Sodium Extended]   . Levetiracetam Nausea Only    Nausea developed while on this med, worsened when dose increased.  Nausea ceased when med was d/cd.  Marland Kitchen Penicillins Hives  . Phenytoin Sodium Extended Other (See Comments)    unknown     REVIEW OF SYSTEMS:  Unable to obtain due to advanced dementia and expressive aphasia   PHYSICAL EXAMINATION  GENERAL APPEARANCE: Normal body habitus EYES: Lids open and close normally. No blepharitis, entropion or ectropion. PERRL. Conjunctivae are clear and sclerae are white. Lenses are without opacity NECK: supple, trachea midline, no neck masses, no thyroid tenderness, no thyromegaly LYMPHATICS: no LAN in the neck, no supraclavicular LAN RESPIRATORY: breathing is even & unlabored, BS CTAB CARDIAC: Irregularly irregular rhythm, no murmur,no extra heart sounds, no edema GI: abdomen soft, normal BS, no masses, no tenderness, no hepatomegaly, no splenomegaly EXTREMITIES:  right hand 3rd, 4th and 5th fingers are contracted, left hand fingers all contracted, not moving ; right hemiparesis PSYCHIATRIC: Alert to self. Affect and behavior are appropriate  LABS/RADIOLOGY: Labs reviewed: Basic Metabolic Panel:  Recent Labs  04/23/15 0752 04/24/15 0228 04/26/15 0706  NA 143 140 139  K 3.4* 3.7 3.9  CL 109 105 104  CO2 25 27 26   GLUCOSE 101* 99 78  BUN 19 16 25*  CREATININE 1.00 1.00 1.16*  CALCIUM 8.7* 8.8* 8.2*   Liver Function Tests:  Recent Labs  02/18/15 0511 04/23/15 0752 04/26/15 0706  AST 15 16 13*  ALT 10* 7* 7*  ALKPHOS 59 85 68  BILITOT 0.6 0.4 0.4  PROT 6.3* 5.7* 5.2*  ALBUMIN 3.0* 2.7* 2.4*   CBC:  Recent Labs  02/15/15 0825 02/17/15 1324   04/23/15 0752 04/24/15 0228 04/26/15 0706  WBC 9.6 12.0*  < > 10.6* 9.1 7.4  NEUTROABS 7.9* 9.6*  --  9.1*  --   --   HGB 12.5 12.1  < > 12.4 12.5 12.3  HCT 37.6 37.8  < > 38.1 38.2 38.0  MCV 98.2 103.3*  < > 102.4* 101.1* 102.7*  PLT 204 256  < > 230 247 234  < > = values in this interval not displayed.  CBG:  Recent Labs  04/23/15 0944  GLUCAP 99    Ct Head Wo Contrast  04/23/2015   CLINICAL DATA:  History of seizure and stroke  EXAM: CT HEAD WITHOUT CONTRAST  TECHNIQUE: Contiguous axial images were obtained from the base of the skull through the vertex without intravenous contrast.  COMPARISON:  02/15/2015; 08/13/2013; brain MRI - 08/13/2013  FINDINGS: Stool sequela of left frontal craniotomy with similar appearance of resection cavity within the left frontal lobe.  Similar findings of advanced atrophy with sulcal prominence and centralized volume loss with commensurate ex vacuo dilatation of the ventricular system. Unchanged old infarct within the right thalamus (image 12, series 201). Extensive periventricular hypodensities compatible microvascular ischemic disease. Given background parenchymal abnormalities, there is no CT evidence of superimposed acute large territory infarct. No intraparenchymal extra-axial mass or hemorrhage. Unchanged size configuration of the ventricles and basilar cisterns. No midline shift.  Limited visualization the paranasal sinuses and mastoid air cells appear normal. No air-fluid levels. Regional soft tissues appear normal. No displaced calvarial fracture.  IMPRESSION: Similar postoperative and chronic changes of the brain without definitive acute intracranial process.   Electronically Signed   By: Sandi Mariscal M.D.   On: 04/23/2015 09:21   Mr Brain Wo Contrast  04/23/2015   CLINICAL DATA:  Witnessed seizure. History of seizures. Personal history of tumor resection and left frontal lobectomy.  EXAM: MRI HEAD WITHOUT CONTRAST  TECHNIQUE: Multiplanar, multiecho  pulse sequences of the brain and surrounding structures were obtained without intravenous contrast.  COMPARISON:  CT head from the same day.  MRI brain 08/13/2013.  FINDINGS: No acute infarct is present. Cystic dilation of the left lateral ventricle following resection of a left frontal lobe lesion is stable. Ex vacuo dilation of the posterior aspect of the left lateral ventricle is stable is well. Remote lacunar infarcts in the basal ganglia are stable. Diffuse white matter changes are again seen.  No acute infarct, hemorrhage, or mass lesion is present. The left internal carotid artery is occluded at the skull base without significant change. The right internal carotid artery is patent. The posterior circulation is patent.  Globes orbits are intact. The paranasal sinuses and mastoid air cells are clear.  IMPRESSION: 1. Stable postoperative changes of the left frontal lobe. 2. Stable remote lacunar infarcts of the basal ganglia. 3. 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 4. Stable diffuse white matter disease. 5. No acute or focal lesion to explain seizures.   Electronically Signed   By: San Morelle M.D.   On: 04/23/2015 16:55   Dg Chest Port 1 View  04/23/2015   CLINICAL DATA:  Seizures this morning and altered mental status.  EXAM: PORTABLE CHEST - 1 VIEW  COMPARISON:  02/15/2015 and prior chest radiographs  FINDINGS: The cardiomediastinal silhouette is unremarkable.  There is no evidence of focal airspace disease, pulmonary edema, suspicious pulmonary nodule/mass, pleural effusion, or pneumothorax. No acute bony abnormalities are identified.  IMPRESSION: No active disease.   Electronically Signed   By: Margarette Canada M.D.   On: 04/23/2015 11:35    ASSESSMENT/PLAN:  Hypertension - increase Clonidine to 0.2 mg 1 tab PO QID; BP/HR Q shift X 1 week  Paroxysmal atrial Fibrillation - rate controlled  Long-term use of anticoagulant - INR 2.6; Coumadin was held X 3 days due to  supratherapeutic INR; decrease Coumadin to 2 mg 1 tab po daily; check INR on 05/10/15  Seizure - discontinue Valproic acid tab; start Valproic acid  500 mg/10 ml PO BID; Valproic acid in 1 week     [facility], NP [facility] [phone]

## 2015-05-20 ENCOUNTER — Other Ambulatory Visit: Payer: Self-pay

## 2015-05-20 MED ORDER — OXYCODONE-ACETAMINOPHEN 5-325 MG PO TABS: 1.0000 | ORAL_TABLET | Freq: Four times a day (QID) | ORAL | Status: DC | PRN

## 2015-05-20 NOTE — Telephone Encounter (Signed)
[facility] request Rx

## 2015-05-21 ENCOUNTER — Inpatient Hospital Stay (HOSPITAL_COMMUNITY)
Admission: EM | Admit: 2015-05-21 | Discharge: 2015-05-25 | DRG: 871 | Disposition: A | Discharge status: Hospice | Payer: Medicare Other | Attending: Family Medicine | Admitting: Family Medicine

## 2015-05-21 ENCOUNTER — Emergency Department (HOSPITAL_COMMUNITY): Payer: Medicare Other

## 2015-05-21 ENCOUNTER — Encounter (HOSPITAL_COMMUNITY): Payer: Self-pay

## 2015-05-21 DIAGNOSIS — N183 Chronic kidney disease, stage 3 unspecified: Secondary | ICD-10-CM | POA: Diagnosis present

## 2015-05-21 DIAGNOSIS — Z515 Encounter for palliative care: Secondary | ICD-10-CM

## 2015-05-21 DIAGNOSIS — I69351 Hemiplegia and hemiparesis following cerebral infarction affecting right dominant side: Secondary | ICD-10-CM

## 2015-05-21 DIAGNOSIS — Z66 Do not resuscitate: Secondary | ICD-10-CM | POA: Diagnosis present

## 2015-05-21 DIAGNOSIS — Z8249 Family history of ischemic heart disease and other diseases of the circulatory system: Secondary | ICD-10-CM | POA: Diagnosis not present

## 2015-05-21 DIAGNOSIS — I10 Essential (primary) hypertension: Secondary | ICD-10-CM

## 2015-05-21 DIAGNOSIS — I5032 Chronic diastolic (congestive) heart failure: Secondary | ICD-10-CM

## 2015-05-21 DIAGNOSIS — D7589 Other specified diseases of blood and blood-forming organs: Secondary | ICD-10-CM | POA: Diagnosis present

## 2015-05-21 DIAGNOSIS — Z7901 Long term (current) use of anticoagulants: Secondary | ICD-10-CM

## 2015-05-21 DIAGNOSIS — F419 Anxiety disorder, unspecified: Secondary | ICD-10-CM | POA: Diagnosis present

## 2015-05-21 DIAGNOSIS — I129 Hypertensive chronic kidney disease with stage 1 through stage 4 chronic kidney disease, or unspecified chronic kidney disease: Secondary | ICD-10-CM | POA: Diagnosis present

## 2015-05-21 DIAGNOSIS — I69320 Aphasia following cerebral infarction: Secondary | ICD-10-CM

## 2015-05-21 DIAGNOSIS — J690 Pneumonitis due to inhalation of food and vomit: Secondary | ICD-10-CM | POA: Diagnosis present

## 2015-05-21 DIAGNOSIS — Z23 Encounter for immunization: Secondary | ICD-10-CM

## 2015-05-21 DIAGNOSIS — F0390 Unspecified dementia without behavioral disturbance: Secondary | ICD-10-CM

## 2015-05-21 DIAGNOSIS — G40909 Epilepsy, unspecified, not intractable, without status epilepticus: Secondary | ICD-10-CM | POA: Diagnosis present

## 2015-05-21 DIAGNOSIS — R569 Unspecified convulsions: Secondary | ICD-10-CM | POA: Diagnosis present

## 2015-05-21 DIAGNOSIS — I482 Chronic atrial fibrillation: Secondary | ICD-10-CM | POA: Diagnosis present

## 2015-05-21 DIAGNOSIS — A419 Sepsis, unspecified organism: Principal | ICD-10-CM | POA: Diagnosis present

## 2015-05-21 DIAGNOSIS — Z96641 Presence of right artificial hip joint: Secondary | ICD-10-CM | POA: Diagnosis present

## 2015-05-21 DIAGNOSIS — E870 Hyperosmolality and hypernatremia: Secondary | ICD-10-CM | POA: Diagnosis present

## 2015-05-21 DIAGNOSIS — E876 Hypokalemia: Secondary | ICD-10-CM | POA: Diagnosis present

## 2015-05-21 DIAGNOSIS — R4701 Aphasia: Secondary | ICD-10-CM | POA: Diagnosis present

## 2015-05-21 DIAGNOSIS — Z8673 Personal history of transient ischemic attack (TIA), and cerebral infarction without residual deficits: Secondary | ICD-10-CM

## 2015-05-21 DIAGNOSIS — I160 Hypertensive urgency: Secondary | ICD-10-CM | POA: Diagnosis present

## 2015-05-21 DIAGNOSIS — F329 Major depressive disorder, single episode, unspecified: Secondary | ICD-10-CM | POA: Diagnosis present

## 2015-05-21 DIAGNOSIS — I480 Paroxysmal atrial fibrillation: Secondary | ICD-10-CM

## 2015-05-21 DIAGNOSIS — E785 Hyperlipidemia, unspecified: Secondary | ICD-10-CM | POA: Diagnosis present

## 2015-05-21 DIAGNOSIS — K222 Esophageal obstruction: Secondary | ICD-10-CM

## 2015-05-21 DIAGNOSIS — F03C0 Unspecified dementia, severe, without behavioral disturbance, psychotic disturbance, mood disturbance, and anxiety: Secondary | ICD-10-CM | POA: Diagnosis present

## 2015-05-21 DIAGNOSIS — K219 Gastro-esophageal reflux disease without esophagitis: Secondary | ICD-10-CM | POA: Diagnosis present

## 2015-05-21 DIAGNOSIS — R1310 Dysphagia, unspecified: Secondary | ICD-10-CM | POA: Diagnosis present

## 2015-05-21 DIAGNOSIS — I6930 Unspecified sequelae of cerebral infarction: Secondary | ICD-10-CM

## 2015-05-21 DIAGNOSIS — D72829 Elevated white blood cell count, unspecified: Secondary | ICD-10-CM | POA: Diagnosis present

## 2015-05-21 DIAGNOSIS — L8990 Pressure ulcer of unspecified site, unspecified stage: Secondary | ICD-10-CM | POA: Insufficient documentation

## 2015-05-21 DIAGNOSIS — F32A Depression, unspecified: Secondary | ICD-10-CM | POA: Diagnosis present

## 2015-05-21 LAB — CBC WITH DIFFERENTIAL/PLATELET
Basophils Absolute: 0 10*3/uL (ref 0.0–0.1)
Basophils Relative: 0 % (ref 0–1)
EOS ABS: 0 10*3/uL (ref 0.0–0.7)
Eosinophils Relative: 0 % (ref 0–5)
HEMATOCRIT: 46.8 % — AB (ref 36.0–46.0)
Hemoglobin: 15.4 g/dL — ABNORMAL HIGH (ref 12.0–15.0)
LYMPHS ABS: 1 10*3/uL (ref 0.7–4.0)
Lymphocytes Relative: 7 % — ABNORMAL LOW (ref 12–46)
MCH: 33.3 pg (ref 26.0–34.0)
MCHC: 32.9 g/dL (ref 30.0–36.0)
MCV: 101.3 fL — ABNORMAL HIGH (ref 78.0–100.0)
Monocytes Absolute: 0.9 10*3/uL (ref 0.1–1.0)
Monocytes Relative: 6 % (ref 3–12)
NEUTROS PCT: 87 % — AB (ref 43–77)
Neutro Abs: 13 10*3/uL — ABNORMAL HIGH (ref 1.7–7.7)
Platelets: 281 10*3/uL (ref 150–400)
RBC: 4.62 MIL/uL (ref 3.87–5.11)
RDW: 13.9 % (ref 11.5–15.5)
WBC: 14.9 10*3/uL — ABNORMAL HIGH (ref 4.0–10.5)

## 2015-05-21 LAB — URINE MICROSCOPIC-ADD ON

## 2015-05-21 LAB — BASIC METABOLIC PANEL
Anion gap: 11 (ref 5–15)
BUN: 25 mg/dL — ABNORMAL HIGH (ref 6–20)
CO2: 30 mmol/L (ref 22–32)
CREATININE: 1.16 mg/dL — AB (ref 0.44–1.00)
Calcium: 9.4 mg/dL (ref 8.9–10.3)
Chloride: 107 mmol/L (ref 101–111)
GFR calc non Af Amer: 45 mL/min — ABNORMAL LOW (ref >60)
GFR, EST AFRICAN AMERICAN: 52 mL/min — AB (ref >60)
Glucose, Bld: 111 mg/dL — ABNORMAL HIGH (ref 65–99)
Potassium: 3.3 mmol/L — ABNORMAL LOW (ref 3.5–5.1)
Sodium: 148 mmol/L — ABNORMAL HIGH (ref 135–145)

## 2015-05-21 LAB — URINALYSIS, ROUTINE W REFLEX MICROSCOPIC
Bilirubin Urine: NEGATIVE
GLUCOSE, UA: NEGATIVE mg/dL
Ketones, ur: 15 mg/dL — AB
Leukocytes, UA: NEGATIVE
Nitrite: NEGATIVE
PH: 5.5 (ref 5.0–8.0)
Protein, ur: 100 mg/dL — AB
SPECIFIC GRAVITY, URINE: 1.02 (ref 1.005–1.030)
Urobilinogen, UA: 0.2 mg/dL (ref 0.0–1.0)

## 2015-05-21 LAB — TROPONIN I: Troponin I: 0.05 ng/mL — ABNORMAL HIGH (ref <0.031)

## 2015-05-21 LAB — VALPROIC ACID LEVEL: Valproic Acid Lvl: 40 ug/mL — ABNORMAL LOW (ref 50.0–100.0)

## 2015-05-21 LAB — PROTIME-INR
INR: 2.38 — ABNORMAL HIGH (ref 0.00–1.49)
PROTHROMBIN TIME: 25.7 s — AB (ref 11.6–15.2)

## 2015-05-21 LAB — LACTIC ACID, PLASMA: LACTIC ACID, VENOUS: 2.3 mmol/L — AB (ref 0.5–2.0)

## 2015-05-21 LAB — APTT: APTT: 35 s (ref 24–37)

## 2015-05-21 LAB — PROCALCITONIN

## 2015-05-21 MED ORDER — LABETALOL HCL 5 MG/ML IV SOLN: 10.0000 mg | Freq: Once | INTRAVENOUS | Status: DC

## 2015-05-21 MED ORDER — CLONIDINE HCL 0.2 MG/24HR TD PTWK
0.2000 mg | MEDICATED_PATCH | TRANSDERMAL | Status: DC
  Administered 2015-05-21: 0.2 mg via TRANSDERMAL
  Filled 2015-05-21: qty 1

## 2015-05-21 MED ORDER — POTASSIUM CHLORIDE 10 MEQ/100ML IV SOLN: 10.0000 meq | INTRAVENOUS | Status: AC

## 2015-05-21 MED ORDER — DILTIAZEM HCL 100 MG IV SOLR
5.0000 mg/h | INTRAVENOUS | Status: DC
  Administered 2015-05-21 – 2015-05-22 (×2): 10 mg/h via INTRAVENOUS
  Administered 2015-05-23 (×2): 15 mg/h via INTRAVENOUS
  Filled 2015-05-21 (×7): qty 100

## 2015-05-21 MED ORDER — ACETAMINOPHEN 325 MG PO TABS
650.0000 mg | ORAL_TABLET | Freq: Four times a day (QID) | ORAL | Status: DC | PRN
Start: 2015-05-21 — End: 2015-05-23

## 2015-05-21 MED ORDER — FAMOTIDINE IN NACL 20-0.9 MG/50ML-% IV SOLN
20.0000 mg | Freq: Two times a day (BID) | INTRAVENOUS | Status: DC
  Administered 2015-05-21 – 2015-05-23 (×4): 20 mg via INTRAVENOUS
  Filled 2015-05-21 (×5): qty 50

## 2015-05-21 MED ORDER — SODIUM CHLORIDE 0.9 % IV BOLUS (SEPSIS)
500.0000 mL | Freq: Once | INTRAVENOUS | Status: AC
Start: 2015-05-21 — End: 2015-05-21
  Administered 2015-05-21: 500 mL via INTRAVENOUS

## 2015-05-21 MED ORDER — HYDROCORTISONE 2.5 % RE CREA: TOPICAL_CREAM | Freq: Every evening | RECTAL | Status: DC | PRN

## 2015-05-21 MED ORDER — CLINDAMYCIN PHOSPHATE 600 MG/50ML IV SOLN
600.0000 mg | Freq: Three times a day (TID) | INTRAVENOUS | Status: DC
  Administered 2015-05-21 – 2015-05-22 (×4): 600 mg via INTRAVENOUS
  Filled 2015-05-21 (×6): qty 50

## 2015-05-21 MED ORDER — LABETALOL HCL 5 MG/ML IV SOLN: 10.0000 mg | INTRAVENOUS | Status: DC | PRN

## 2015-05-21 MED ORDER — ACETAMINOPHEN 650 MG RE SUPP: 650.0000 mg | Freq: Four times a day (QID) | RECTAL | Status: DC | PRN

## 2015-05-21 MED ORDER — VALPROATE SODIUM 500 MG/5ML IV SOLN
750.0000 mg | Freq: Once | INTRAVENOUS | Status: AC
  Administered 2015-05-21: 750 mg via INTRAVENOUS
  Filled 2015-05-21: qty 7.5

## 2015-05-21 MED ORDER — LORAZEPAM 2 MG/ML IJ SOLN: 0.5000 mg | INTRAMUSCULAR | Status: DC | PRN

## 2015-05-21 MED ORDER — HYDROXYZINE HCL 50 MG/ML IM SOLN
25.0000 mg | Freq: Four times a day (QID) | INTRAMUSCULAR | Status: DC | PRN
  Filled 2015-05-21: qty 0.5

## 2015-05-21 MED ORDER — LABETALOL HCL 5 MG/ML IV SOLN
5.0000 mg | Freq: Once | INTRAVENOUS | Status: AC
  Administered 2015-05-21: 5 mg via INTRAVENOUS

## 2015-05-21 MED ORDER — DEXTROSE 5 % IV SOLN
500.0000 mg | Freq: Two times a day (BID) | INTRAVENOUS | Status: DC
  Administered 2015-05-22 – 2015-05-23 (×3): 500 mg via INTRAVENOUS
  Filled 2015-05-21 (×4): qty 5

## 2015-05-21 MED ORDER — SODIUM CHLORIDE 0.9 % IV BOLUS (SEPSIS)
500.0000 mL | Freq: Once | INTRAVENOUS | Status: AC
  Administered 2015-05-21: 500 mL via INTRAVENOUS

## 2015-05-21 MED ORDER — SODIUM CHLORIDE 0.9 % IJ SOLN
3.0000 mL | Freq: Two times a day (BID) | INTRAMUSCULAR | Status: DC
  Administered 2015-05-22 – 2015-05-25 (×6): 3 mL via INTRAVENOUS

## 2015-05-21 MED ORDER — POTASSIUM CHLORIDE 10 MEQ/100ML IV SOLN
10.0000 meq | INTRAVENOUS | Status: AC
  Administered 2015-05-22 (×2): 10 meq via INTRAVENOUS
  Filled 2015-05-21: qty 100

## 2015-05-21 MED ORDER — NITROGLYCERIN 0.2 MG/HR TD PT24
0.2000 mg | MEDICATED_PATCH | Freq: Every day | TRANSDERMAL | Status: DC
Start: 2015-05-21 — End: 2015-05-21

## 2015-05-21 MED ORDER — LABETALOL HCL 5 MG/ML IV SOLN
5.0000 mg | Freq: Once | INTRAVENOUS | Status: AC
  Administered 2015-05-21: 5 mg via INTRAVENOUS
  Filled 2015-05-21: qty 4

## 2015-05-21 MED ORDER — LABETALOL HCL 5 MG/ML IV SOLN
10.0000 mg | Freq: Once | INTRAVENOUS | Status: AC
  Administered 2015-05-21: 10 mg via INTRAVENOUS

## 2015-05-21 MED ORDER — SODIUM CHLORIDE 0.9 % IV SOLN
INTRAVENOUS | Status: DC
  Administered 2015-05-21 – 2015-05-22 (×2): via INTRAVENOUS

## 2015-05-21 NOTE — Progress Notes (Deleted)
ANTIBIOTIC CONSULT NOTE - INITIAL  Pharmacy Consult for Primaxin Indication: rule out pneumonia  Allergies  Allergen Reactions  . Ciprofloxacin Other (See Comments)    diarrhea/yeast infection  . Codeine Other (See Comments)    unknown  . Dilantin [Phenytoin Sodium Extended]   . Levetiracetam Nausea Only    Nausea developed while on this med, worsened when dose increased.  Nausea ceased when med was d/cd.  Marland Kitchen Penicillins Hives  . Phenytoin Sodium Extended Other (See Comments)    unknown    Patient Measurements:   Adjusted Body Weight:   Vital Signs: Temp: 99.4 F (37.4 C) (08/27 1521) Temp Source: Rectal (08/27 1521) BP: 203/90 mmHg (08/27 2100) Pulse Rate: 101 (08/27 2000) Intake/Output from previous day:   Intake/Output from this shift:    Labs:  Recent Labs  05/21/15 1557  WBC 14.9*  HGB 15.4*  PLT 281  CREATININE 1.16*   CrCl cannot be calculated (Unknown ideal weight.). No results for input(s): VANCOTROUGH, VANCOPEAK, VANCORANDOM, GENTTROUGH, GENTPEAK, GENTRANDOM, TOBRATROUGH, TOBRAPEAK, TOBRARND, AMIKACINPEAK, AMIKACINTROU, AMIKACIN in the last 72 hours.   Microbiology: Recent Results (from the past 720 hour(s))  Urine culture     Status: None   Collection Time: 04/23/15  8:50 AM  Result Value Ref Range Status   Specimen Description URINE, CATHETERIZED  Final   Special Requests NONE  Final   Culture NO GROWTH 1 DAY  Final   Report Status 04/24/2015 FINAL  Final  MRSA PCR Screening     Status: None   Collection Time: 04/23/15 12:44 PM  Result Value Ref Range Status   MRSA by PCR NEGATIVE NEGATIVE Final    Comment:        The GeneXpert MRSA Assay (FDA approved for NASAL specimens only), is one component of a comprehensive MRSA colonization surveillance program. It is not intended to diagnose MRSA infection nor to guide or monitor treatment for MRSA infections.     Medical History: Past Medical History  Diagnosis Date  . Depressive  disorder, not elsewhere classified   . Atrial fibrillation   . Unspecified transient cerebral ischemia   . Unspecified cerebral artery occlusion with cerebral infarction   . Unspecified essential hypertension   . Diverticulosis of colon (without mention of hemorrhage)   . Stricture and stenosis of esophagus   . Seizure disorder   . GERD (gastroesophageal reflux disease)   . IBS (irritable bowel syndrome)   . Stroke   . CHF (congestive heart failure)   . Anemia   . Hyperlipidemia   . Arrhythmia   . Hx: UTI (urinary tract infection)   . Seizures     Medications:   (Not in a hospital admission) Scheduled:  . cloNIDine  0.2 mg Transdermal Weekly  . sodium chloride  3 mL Intravenous Q12H   Infusions:  . sodium chloride    . diltiazem (CARDIZEM) infusion    . famotidine (PEPCID) IV    . potassium chloride    . sodium chloride    . [START ON 05/22/2015] valproate sodium    . valproate sodium     Assessment: 79yo female with history of Afib on warfarin, CVA, CHF and seizures d/o presents with elevated BP. Pharmacy is consulted to dose primaxin for suspected aspiration pneumonia. Tmax 99.4, WBC 14.9, sCr 1.16.  Goal of Therapy:  Eradication of infection  Plan:  Primaxin 250mg  IV q6h Follow up culture results, renal function and clinical course  Andrey Cota. Diona Foley, PharmD Clinical Pharmacist Pager [phone] 05/21/2015,9:03  PM

## 2015-05-21 NOTE — H&P (Addendum)
[facility] History and Physical  [name] BUCHAN YFV:[id number] DOB: 06-08-1936 DOA: 05/21/2015  Referring physician: ED physician PCP: Cari Caraway, MD  Specialists:   Chief Complaint: Aspiration and elevated blood pressure.  HPI: [name] is a 79 y.o. female with PMH of esophageal stricture, stroke with right-sided weakness, TIA, atrial fibrillation on Coumadin, seizure, diastolic congestive heart failure, severe dementia, CKD-III, who presents with the aspiration and elevated bp.  Patient has severe dementia, and is unable to provide much history, therefore, most of the history is obtained by discussing the case with the ED physician and her daughter and son. It seems that patient aspirated when she took her oral medications at [facility], therefore could not take oral medications normally. Her blood pressure was significantly elevated. Per patient's daughter, patient normally does not move her bilateral legs due to both previous stroke and severe dementia, which has not changed. They report that pt is always aphasic but feels she has been better before. Reports that she is admitted 3 weeks ago for seizures and hypertension at [facility] and was doing much better after this admission, but has had a downfall since returning to home. Per daughter, patient's dysphagia seems to be progressively getting worse in the past 2 weeks. Patient had mild cough, but no preterm production. No respiratory distress. They deny any vomiting, seizure-like activity, diarrhea, rashes.  In ED, patient was found to have elevated Bp 255/112, INR 2.38, subtherapeutic valproic acid level at 40, potassium 3.3, negative urinalysis, stable renal function, negative chest x-ray, negative CT -head for acute abnormalities, temperature 99.4, mildly tachycardia. Patient is admitted to inpatient for further evaluation and treatment.  Where does patient live?   At SNF  Can patient participate in ADLs?  Yes     Review of  Systems: Could not be obtained due to severe dementia.  Allergy:  Allergies  Allergen Reactions  . Ciprofloxacin Other (See Comments)    diarrhea/yeast infection  . Codeine Other (See Comments)    unknown  . Dilantin [Phenytoin Sodium Extended]   . Levetiracetam Nausea Only    Nausea developed while on this med, worsened when dose increased.  Nausea ceased when med was d/cd.  Marland Kitchen Penicillins Hives  . Phenytoin Sodium Extended Other (See Comments)    unknown    Past Medical History  Diagnosis Date  . Depressive disorder, not elsewhere classified   . Atrial fibrillation   . Unspecified transient cerebral ischemia   . Unspecified cerebral artery occlusion with cerebral infarction   . Unspecified essential hypertension   . Diverticulosis of colon (without mention of hemorrhage)   . Stricture and stenosis of esophagus   . Seizure disorder   . GERD (gastroesophageal reflux disease)   . IBS (irritable bowel syndrome)   . Stroke   . CHF (congestive heart failure)   . Anemia   . Hyperlipidemia   . Arrhythmia   . Hx: UTI (urinary tract infection)   . Seizures     Past Surgical History  Procedure Laterality Date  . Hemorrhoid surgery    . Benign braintumor      Removal  . Flexible sigmoidoscopy  09/02/2011    Procedure: FLEXIBLE SIGMOIDOSCOPY;  Surgeon: Zenovia Jarred, MD;  Location: [facility] ENDOSCOPY;  Service: Gastroenterology;  Laterality: N/A;  . Hip arthroplasty  11/26/2011    Procedure: ARTHROPLASTY BIPOLAR HIP;  Surgeon: Alta Corning, MD;  Location: [facility] ORS;  Service: Orthopedics;  Laterality: Right;    Social History:  reports that she  has never smoked. She has never used smokeless tobacco. She reports that she does not drink alcohol or use illicit drugs.  Family History:  Family History  Problem Relation Age of Onset  . Heart disease Mother   . Colon cancer Neg Hx      Prior to Admission medications   Medication Sig Start Date End Date Taking? Authorizing Provider   acetaminophen (TYLENOL) 500 MG tablet Take 500 mg by mouth every 4 (four) hours as needed (pain).     Historical Provider, MD  atorvastatin (LIPITOR) 40 MG tablet Take 40 mg by mouth daily at 6 PM.     Historical Provider, MD  Cholecalciferol 2000 UNITS TABS Take 4,000 Units by mouth daily.    Historical Provider, MD  citalopram (CELEXA) 20 MG tablet Take 20 mg by mouth daily.    Historical Provider, MD  cloNIDine (CATAPRES) 0.2 MG tablet Take 1 tablet (0.2 mg total) by mouth 3 (three) times daily. 04/26/15   Allie Bossier, MD  conjugated estrogens (PREMARIN) vaginal cream Place 1 Applicatorful vaginally at bedtime. Apply  small amount on urethra daily    Historical Provider, MD  diltiazem (CARDIZEM CD) 120 MG 24 hr capsule Take 120 mg by mouth daily. Take on an empty stomach 08/11/13   Historical Provider, MD  food thickener (THICK IT) POWD Take 1 g by mouth as needed (as needed to thinken liquids). 04/26/15   Allie Bossier, MD  hydrocortisone (PROCTO-PAK) 1 % CREA Apply topically at bedtime as needed (for blood in stool).    Historical Provider, MD  lisinopril (PRINIVIL,ZESTRIL) 40 MG tablet Take 40 mg by mouth daily.     Historical Provider, MD  loperamide (IMODIUM A-D) 2 MG tablet Take 2 mg by mouth as needed for diarrhea or loose stools.    Historical Provider, MD  LORazepam (ATIVAN) 0.5 MG tablet Take 1 tablet (0.5 mg total) by mouth at bedtime. 04/27/15   Gildardo Cranker, DO  metoprolol (LOPRESSOR) 100 MG tablet Take 1 tablet (100 mg total) by mouth 2 (two) times daily. 02/22/15   Hosie Poisson, MD  mirtazapine (REMERON) 15 MG tablet Take 15 mg by mouth at bedtime.    Historical Provider, MD  ondansetron (ZOFRAN) 8 MG tablet Take 8 mg by mouth every 8 (eight) hours as needed for nausea or vomiting.    Historical Provider, MD  oxyCODONE-acetaminophen (PERCOCET) 5-325 MG per tablet Take 1 tablet by mouth every 6 (six) hours as needed for moderate pain or severe pain. 05/20/15   Tiffany L Reed, DO   Protein (PROCEL PO) Take 1 scoop by mouth 2 (two) times daily.     Historical Provider, MD  saccharomyces boulardii (FLORASTOR) 250 MG capsule Take 250 mg by mouth 2 (two) times daily.     Historical Provider, MD  UNABLE TO FIND Take 120 mLs by mouth 2 (two) times daily. Med Name: Med Pass    Historical Provider, MD  valproic acid (DEPAKENE) 250 MG capsule Take 2 capsules (500 mg total) by mouth 2 (two) times daily. 04/26/15   Allie Bossier, MD  warfarin (COUMADIN) 3 MG tablet Take 3 mg by mouth daily.    Historical Provider, MD    Physical Exam: Filed Vitals:   05/21/15 1749 05/21/15 1830 05/21/15 1930 05/21/15 2000  BP: 216/107 208/94 199/102 213/103  Pulse: 89 98 95 101  Temp:      TempSrc:      Resp: 26 29 15 17   SpO2: 93% 93%  93% 91%   General: Not in acute distress HEENT:       Eyes: PERRL, no scleral icterus.       ENT: No discharge from the ears and nose, no pharynx injection, no tonsillar enlargement.        Neck: No JVD, no bruit, no mass felt. Heme: No neck lymph node enlargement. Cardiac: S1/S2, RRR, No murmurs, No gallops or rubs. Pulm: No rales, wheezing, rhonchi or rubs. Abd: Soft, nondistended, no rebound pain, no organomegaly, BS present. Ext: No pitting leg edema bilaterally. 2+DP/PT pulse bilaterally. Musculoskeletal: No joint deformities, No joint redness or warmth, no limitation of ROM in spin. Skin: No rashes.  Neuro: drowsy, not oriented X3, cranial nerves II-XII grossly intact, not moves both legs, both arms has rigidity which is normal to pt per her daughter. Psych: Patient is not psychotic, no suicidal or hemocidal ideation.  Labs on Admission:  Basic Metabolic Panel:  Recent Labs Lab 05/21/15 1557  NA 148*  K 3.3*  CL 107  CO2 30  GLUCOSE 111*  BUN 25*  CREATININE 1.16*  CALCIUM 9.4   Liver Function Tests: No results for input(s): AST, ALT, ALKPHOS, BILITOT, PROT, ALBUMIN in the last 168 hours. No results for input(s): LIPASE, AMYLASE in  the last 168 hours. No results for input(s): AMMONIA in the last 168 hours. CBC:  Recent Labs Lab 05/21/15 1557  WBC 14.9*  NEUTROABS 13.0*  HGB 15.4*  HCT 46.8*  MCV 101.3*  PLT 281   Cardiac Enzymes: No results for input(s): CKTOTAL, CKMB, CKMBINDEX, TROPONINI in the last 168 hours.  BNP (last 3 results) No results for input(s): BNP in the last 8760 hours.  ProBNP (last 3 results) No results for input(s): PROBNP in the last 8760 hours.  CBG: No results for input(s): GLUCAP in the last 168 hours.  Radiological Exams on Admission: Dg Chest 2 View  05/21/2015   CLINICAL DATA:  Productive cough. Aphasic. Hypertension and low-grade fever.  EXAM: CHEST  2 VIEW  COMPARISON:  04/23/2015 and 01/26/2015  FINDINGS: Lungs are adequately inflated with no focal airspace consolidation or effusion. Cardiomediastinal silhouette and remainder of the exam is unchanged including an old displaced left humeral neck fracture.  IMPRESSION: No active cardiopulmonary disease.   Electronically Signed   By: Marin Olp M.D.   On: 05/21/2015 16:42   Ct Head Wo Contrast  05/21/2015   CLINICAL DATA:  Altered mental status. Aphasia. History of atrial fibrillation. Stroke. Benign brain tumor.  EXAM: CT HEAD WITHOUT CONTRAST  TECHNIQUE: Contiguous axial images were obtained from the base of the skull through the vertex without intravenous contrast.  COMPARISON:  MRI of 04/22/2017 and CT of 04/22/2017.  FINDINGS: Sinuses/Soft tissues: Clear paranasal sinuses and mastoid air cells. Left frontal craniotomy.  Intracranial: Advanced low density in the periventricular white matter likely related to small vessel disease. Redemonstration of left frontal encephalomalacia. Advanced cerebral atrophy with similar resultant ventriculomegaly. Cerebellar atrophy is also advanced. Remote right basal ganglia lacunar infarct again identified. No convincing evidence of acute hemorrhage, infarct, mass lesion, intra-axial, or  extra-axial fluid collection.  IMPRESSION: 1. Redemonstration of encephalomalacia involving the left frontal lobe. 2. Severe cerebral atrophy and small vessel ischemic change with remote right basal ganglia lacunar infarct. 3. No convincing evidence of acute superimposed process.   Electronically Signed   By: Abigail Miyamoto M.D.   On: 05/21/2015 16:39    EKG: Independently reviewed.  Abnormal findings: QTC 498, atrial fibrillation  Assessment/Plan Principal  Problem:   Hypertensive urgency Active Problems:   Depression   History of CVA (cerebrovascular accident)   ESOPHAGEAL STRICTURE   GERD (gastroesophageal reflux disease)   Leukocytosis   Chronic diastolic CHF (congestive heart failure)   Seizure   Paroxysmal atrial fibrillation   Chronic arterial ischemic stroke   Advanced dementia   HLD (hyperlipidemia)   CKD (chronic kidney disease), stage III  Hypertensive urgency: bp is up to as high as 255/112 in ED, which improved to 208/94 with IV labetalol. This is due to being unable to take her oral meds 2/2 aspiration.   -will admit to sdu -Hold all oral meds -start nitroglycerin patch -Start clonidine patch - labetalol IV 10 mg prn for SBP>190 -Start Cardizem drip both for elevated blood pressure and atrial fibrillation -Frequent neuro checks -trop x3  Aspiration and esophageal stricture: this is likely due to worsening esophageal stricture. Cannot completely rule out new stroke though there is no other symptoms indicating new stroke. -Hold oral meds -NPO -SLP  Atrial Fibrillation: CHA2DS2-VASc Score is 7, needs oral anticoagulation. Patient is on Coumadin. INR is therapeutic 2.38 on admission. Heart rate is well controlled. -will switch to IV heparin bridging  -hold oral home metoprolol and Cardizem -Cardizem drip  History of CVA (cerebrovascular accident):  Patient does not have new symptoms to indicate new stroke except for worsening dysphagia which could be from hx of  esophageal stricture. Her daughter would like to hold MRI-brain and get swalling test first. -Switched coumadin to IV heparin   GERD: -IV pepcid  Chronic diastolic CHF (congestive heart failure): 2-D echo 11/24/11 showed EF 60-65%. Patient is not on diuretics at home. CHF is compensated.  -check BNP.   Seizure: on Depakene at home, with the subtherapeutic level, 40 on admission -switch oral Depakene to 500 mb bid -give extra dose of 250 mg tonight (total 750 mg x 1 now).  Depression and anxiety: -hold home oral meds -Ativan prn for agitation.  HLD: Last LDL was 89 on 03/08/11 -Hold oral lipitor now  CKD-III: stable. Baseline creatinine 1.0-1.2, her creatinine is 1.16, BUN 25, which is at baseline. -Follow-up Up renal functions by BMP  Leukocytosis: Etiology is not clear, urinalysis negative, chest x-ray is negative. Temperature normal. Patient admits criteria of sepsis with leukocytosis, tachycardia and tachypnea.  Likely due to stress-induced demargination. Cannot rule out early aspiration pneumonia. -Check Blood culture x 2 -IV clindamycin -will get Procalcitonin and trend lactic acid levels per sepsis protocol. -IVF: 500 x 2 NS bolus in ED, followed by 75 cc/h (patient has a congestive heart failure, limiting aggressive IV fluids treatment). -if produce sputum, will get culture  DVT ppx: On IV heparin  Code Status: DNR Family Communication:  Yes, patient's son and daguhter  at bed side Disposition Plan: Admit to inpatient   Date of Service 05/21/2015    Ivor Costa [facility] Pager [phone]  If 7PM-7AM, please contact night-coverage [email or web address] Password [facility] 05/21/2015, 8:48 PM

## 2015-05-21 NOTE — ED Notes (Signed)
To room via EMS.  Pt from [facility] on [address].  Pt called out for BP 222/122 SpO2 96% Temp 100.1 oral RR 16 HR 87 per nursing home papers and staff reported that after pts medications @ 11:30a pt had to be suctioned but main reason for transport is elevated BP.   Pt is aphasic, responds to pain.  Son is now at bedside who reports pt has had BP issues and a swallow test done last week and is on honey thickened liquids and puree diet.

## 2015-05-21 NOTE — ED Provider Notes (Addendum)
ED ECG REPORT   Date: 05/21/2015  Rate: 90  Rhythm: atrial fibrillation  QRS Axis: right  Intervals: normal  ST/T Wave abnormalities: nonspecific ST/T changes  Conduction Disutrbances:none  Narrative Interpretation:   Old EKG Reviewed: none available, similar to prior tracing  I have personally reviewed the EKG tracing and agree with the computerized printout as noted.   Malvin Johns, MD 05/21/15 1635  Malvin Johns, MD 05/21/15 [phone]

## 2015-05-21 NOTE — ED Provider Notes (Signed)
CSN: [id number]     Arrival date & time 05/21/15  1426 History   First MD Initiated Contact with Patient 05/21/15 1458     Chief Complaint  Patient presents with  . Hypertension  . Dysphagia   Level V Caveat: AMS, CVA  [name] is a 79 y.o. 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  Nursing staff also reports that she normally takes by mouth medications and today aspirated while trying to take her medications. They report that they needed to use suction on her today. Family at bedside report that the patient has had a gradual downhill of her mental status over the past 3 weeks. They report that she is always aphasic but feels she has been better before. Reports that she is admitted 3 weeks ago for seizures and hypertension at [facility] and was doing much better after this admission but has had a downfall since returning to home. They she has not been wanting to eat much recently. They deny any vomiting, seizure-like activity, diarrhea, rashes.  The patient is DO NOT RESUSCITATE.  (Consider location/radiation/quality/duration/timing/severity/associated sxs/prior Treatment) HPI  Past Medical History  Diagnosis Date  . Depressive disorder, not elsewhere classified   . Atrial fibrillation   . Unspecified transient cerebral ischemia   . Unspecified cerebral artery occlusion with cerebral infarction   . Unspecified essential hypertension   . Diverticulosis of colon (without mention of hemorrhage)   . Stricture and stenosis of esophagus   . Seizure disorder   . GERD (gastroesophageal reflux disease)   . IBS (irritable bowel syndrome)   . Stroke   . CHF (congestive heart failure)   . Anemia   . Hyperlipidemia   . Arrhythmia   . Hx: UTI (urinary tract infection)   . Seizures     Past Surgical History  Procedure Laterality Date  . Hemorrhoid surgery    . Benign braintumor      Removal  . Flexible sigmoidoscopy  09/02/2011    Procedure: FLEXIBLE SIGMOIDOSCOPY;  Surgeon: Zenovia Jarred, MD;  Location: [facility] ENDOSCOPY;  Service: Gastroenterology;  Laterality: N/A;  . Hip arthroplasty  11/26/2011    Procedure: ARTHROPLASTY BIPOLAR HIP;  Surgeon: Alta Corning, MD;  Location: [facility] ORS;  Service: Orthopedics;  Laterality: Right;   Family History  Problem Relation Age of Onset  . Heart disease Mother   . Colon cancer Neg Hx    Social History  Substance Use Topics  . Smoking status: Never Smoker   . Smokeless tobacco: Never Used  . Alcohol Use: No   OB History    No data available     Review of Systems  Unable to perform ROS: Patient nonverbal      Allergies  Ciprofloxacin; Codeine; Dilantin; Levetiracetam; Penicillins; and Phenytoin sodium extended  Home Medications   Prior to Admission medications   Medication Sig Start Date End Date Taking? Authorizing Provider  acetaminophen (TYLENOL) 500 MG tablet Take 500 mg by mouth every 4 (four) hours as needed (pain).     Historical Provider, MD  atorvastatin (LIPITOR) 40 MG tablet Take 40 mg by mouth daily at 6 PM.     Historical Provider, MD  Cholecalciferol 2000 UNITS TABS Take 4,000 Units by mouth daily.    Historical Provider, MD  citalopram (  CELEXA) 20 MG tablet Take 20 mg by mouth daily.    Historical Provider, MD  cloNIDine (CATAPRES) 0.2 MG tablet Take 1 tablet (0.2 mg total) by mouth 3 (three) times daily. 04/26/15   Allie Bossier, MD  conjugated estrogens (PREMARIN) vaginal cream Place 1 Applicatorful vaginally at bedtime. Apply  small amount on urethra daily    Historical Provider, MD  diltiazem (CARDIZEM CD) 120 MG 24 hr capsule Take 120 mg by mouth daily. Take on an empty stomach 08/11/13   Historical Provider, MD  food thickener (THICK IT) POWD Take 1 g by mouth as needed (as needed to thinken liquids).  04/26/15   Allie Bossier, MD  hydrocortisone (PROCTO-PAK) 1 % CREA Apply topically at bedtime as needed (for blood in stool).    Historical Provider, MD  lisinopril (PRINIVIL,ZESTRIL) 40 MG tablet Take 40 mg by mouth daily.     Historical Provider, MD  loperamide (IMODIUM A-D) 2 MG tablet Take 2 mg by mouth as needed for diarrhea or loose stools.    Historical Provider, MD  LORazepam (ATIVAN) 0.5 MG tablet Take 1 tablet (0.5 mg total) by mouth at bedtime. 04/27/15   Gildardo Cranker, DO  metoprolol (LOPRESSOR) 100 MG tablet Take 1 tablet (100 mg total) by mouth 2 (two) times daily. 02/22/15   Hosie Poisson, MD  mirtazapine (REMERON) 15 MG tablet Take 15 mg by mouth at bedtime.    Historical Provider, MD  ondansetron (ZOFRAN) 8 MG tablet Take 8 mg by mouth every 8 (eight) hours as needed for nausea or vomiting.    Historical Provider, MD  oxyCODONE-acetaminophen (PERCOCET) 5-325 MG per tablet Take 1 tablet by mouth every 6 (six) hours as needed for moderate pain or severe pain. 05/20/15   Tiffany L Reed, DO  Protein (PROCEL PO) Take 1 scoop by mouth 2 (two) times daily.     Historical Provider, MD  saccharomyces boulardii (FLORASTOR) 250 MG capsule Take 250 mg by mouth 2 (two) times daily.     Historical Provider, MD  UNABLE TO FIND Take 120 mLs by mouth 2 (two) times daily. Med Name: Med Pass    Historical Provider, MD  valproic acid (DEPAKENE) 250 MG capsule Take 2 capsules (500 mg total) by mouth 2 (two) times daily. 04/26/15   Allie Bossier, MD  warfarin (COUMADIN) 3 MG tablet Take 3 mg by mouth daily.    Historical Provider, MD   BP 199/102 mmHg  Pulse 95  Temp(Src) 99.4 F (37.4 C) (Rectal)  Resp 15  SpO2 93% Physical Exam  Constitutional: She appears well-developed and well-nourished. No distress.  Nontoxic appearing.  HENT:  Head: Normocephalic and atraumatic.  Mouth/Throat: Oropharynx is clear and moist.  Mouth is clear. Handling secretions without difficulty.  Eyes: Conjunctivae are  normal. Pupils are equal, round, and reactive to light. Right eye exhibits no discharge. Left eye exhibits no discharge.  Neck: Neck supple. No JVD present.  No stridor.  Cardiovascular: Normal rate, regular rhythm, normal heart sounds and intact distal pulses.   Pulmonary/Chest: Effort normal and breath sounds normal. No respiratory distress. She has no wheezes. She has no rales.  Lung sounds clear to auscultation bilaterally.  Abdominal: Soft. Bowel sounds are normal. She exhibits no distension. There is no tenderness. There is no guarding.  Abdomen is soft and nontender to palpation.  Musculoskeletal: She exhibits no edema or tenderness.  No lower extremity edema or tenderness.  Lymphadenopathy:    She has no cervical adenopathy.  Neurological: She is alert.  The patient is alert. She will make eye contact when her name is called. She does not follow any commands. No verbal response.  Skin: Skin is warm and dry. No rash noted. She is not diaphoretic. No erythema. No pallor.  Psychiatric: She has a normal mood and affect. Her behavior is normal.  Nursing note and vitals reviewed.   ED Course  Procedures (including critical care time) Labs Review Labs Reviewed  BASIC METABOLIC PANEL - Abnormal; Notable for the following:    Sodium 148 (*)    Potassium 3.3 (*)    Glucose, Bld 111 (*)    BUN 25 (*)    Creatinine, Ser 1.16 (*)    GFR calc non Af Amer 45 (*)    GFR calc Af Amer 52 (*)    All other components within normal limits  CBC WITH DIFFERENTIAL/PLATELET - Abnormal; Notable for the following:    WBC 14.9 (*)    Hemoglobin 15.4 (*)    HCT 46.8 (*)    MCV 101.3 (*)    Neutrophils Relative % 87 (*)    Neutro Abs 13.0 (*)    Lymphocytes Relative 7 (*)    All other components within normal limits  PROTIME-INR - Abnormal; Notable for the following:    Prothrombin Time 25.7 (*)    INR 2.38 (*)    All other components within normal limits  VALPROIC ACID LEVEL - Abnormal;  Notable for the following:    Valproic Acid Lvl 40 (*)    All other components within normal limits  CULTURE, BLOOD (ROUTINE X 2)  CULTURE, BLOOD (ROUTINE X 2)  URINALYSIS, ROUTINE W REFLEX MICROSCOPIC (NOT AT [facility])  MAGNESIUM  LACTIC ACID, PLASMA  LACTIC ACID, PLASMA  PROCALCITONIN  APTT  TROPONIN I  TROPONIN I  TROPONIN I  COMPREHENSIVE METABOLIC PANEL  CBC  PROTIME-INR    Imaging Review Dg Chest 2 View  05/21/2015   CLINICAL DATA:  Productive cough. Aphasic. Hypertension and low-grade fever.  EXAM: CHEST  2 VIEW  COMPARISON:  04/23/2015 and 01/26/2015  FINDINGS: Lungs are adequately inflated with no focal airspace consolidation or effusion. Cardiomediastinal silhouette and remainder of the exam is unchanged including an old displaced left humeral neck fracture.  IMPRESSION: No active cardiopulmonary disease.   Electronically Signed   By: Marin Olp M.D.   On: 05/21/2015 16:42   Ct Head Wo Contrast  05/21/2015   CLINICAL DATA:  Altered mental status. Aphasia. History of atrial fibrillation. Stroke. Benign brain tumor.  EXAM: CT HEAD WITHOUT CONTRAST  TECHNIQUE: Contiguous axial images were obtained from the base of the skull through the vertex without intravenous contrast.  COMPARISON:  MRI of 04/22/2017 and CT of 04/22/2017.  FINDINGS: Sinuses/Soft tissues: Clear paranasal sinuses and mastoid air cells. Left frontal craniotomy.  Intracranial: Advanced low density in the periventricular white matter likely related to small vessel disease. Redemonstration of left frontal encephalomalacia. Advanced cerebral atrophy with similar resultant ventriculomegaly. Cerebellar atrophy is also advanced. Remote right basal ganglia lacunar infarct again identified. No convincing evidence of acute hemorrhage, infarct, mass lesion, intra-axial, or extra-axial fluid collection.  IMPRESSION: 1. Redemonstration of encephalomalacia involving the left frontal lobe. 2. Severe cerebral atrophy and small vessel  ischemic change with remote right basal ganglia lacunar infarct. 3. No convincing evidence of acute superimposed process.   Electronically Signed   By: Abigail Miyamoto M.D.   On: 05/21/2015 16:39   I have personally reviewed and evaluated  these images and lab results as part of my medical decision-making.   EKG Interpretation   Date/Time:  Saturday May 21 2015 15:06:46 EDT Ventricular Rate:  90 PR Interval:    QRS Duration: 98 QT Interval:  407 QTC Calculation: 498 R Axis:   110 Text Interpretation:  Atrial fibrillation Right axis deviation  Anteroseptal infarct, old Minimal ST depression, lateral leads No  significant change since last tracing with exception of slower rate  Confirmed by LITTLE MD, RACHEL [phone]) on 05/21/2015 5:00:16 PM      Filed Vitals:   05/21/15 1716 05/21/15 1749 05/21/15 1830 05/21/15 1930  BP: 255/112 216/107 208/94 199/102  Pulse: 94 89 98 95  Temp:      TempSrc:      Resp: 24 26 29 15   SpO2: 100% 93% 93% 93%     MDM   Meds given in ED:  Medications  cloNIDine (CATAPRES - Dosed in mg/24 hr) patch 0.2 mg (not administered)  nitroGLYCERIN (NITRODUR - Dosed in mg/24 hr) patch 0.2 mg (not administered)  hydrocortisone (PROCTOCORT) 1 % rectal cream (not administered)  diltiazem (CARDIZEM) 100 mg in dextrose 5 % 100 mL (1 mg/mL) infusion (not administered)  labetalol (NORMODYNE,TRANDATE) injection 10 mg (not administered)  potassium chloride 10 mEq in 100 mL IVPB (not administered)  sodium chloride 0.9 % injection 3 mL (not administered)  acetaminophen (TYLENOL) tablet 650 mg (not administered)    Or  acetaminophen (TYLENOL) suppository 650 mg (not administered)  hydrOXYzine (VISTARIL) injection 25 mg (not administered)  valproate (DEPACON) 750 mg in dextrose 5 % 50 mL IVPB (not administered)  valproate (DEPACON) 500 mg in dextrose 5 % 50 mL IVPB (not administered)  labetalol (NORMODYNE,TRANDATE) injection 5 mg (5 mg Intravenous Given 05/21/15 1544)   sodium chloride 0.9 % bolus 500 mL (0 mLs Intravenous Stopped 05/21/15 1851)  labetalol (NORMODYNE,TRANDATE) injection 5 mg (5 mg Intravenous Given 05/21/15 1753)    New Prescriptions   No medications on file    Final diagnoses:  Hypertensive urgency  Dysphagia   Patient presents with hypertensive urgency and an episode of aspiration on her oral medications today. Family reports decreased mental status recently despite her chronic aphasia and right hemiparesis. In the emergency department patient has a blood pressure in the 200s over 100s and has an elevated creatinine. 500 ML fluid bolus. Awaiting on her urinalysis. Chest x-ray was unremarkable. CT of her head showed no acute changes. As the patient is unable to tolerate her oral medicines today as well as her hypertensive urgency will plan for admission. The patient was accepted for admission by Dr. Blaine Hamper who requested stepdown temporary admission orders.   This patient was discussed with and evaluated by Dr. Tamera Punt who agrees with assessment and plan.      Waynetta Pean, PA-C 05/21/15 2017  Malvin Johns, MD 05/22/15 0900

## 2015-05-21 NOTE — Progress Notes (Signed)
ANTICOAGULATION CONSULT NOTE - Initial Consult  Pharmacy Consult for Heparin once INR < 2 Indication: atrial fibrillation  Allergies  Allergen Reactions  . Ciprofloxacin Other (See Comments)    diarrhea/yeast infection  . Codeine Other (See Comments)    unknown  . Dilantin [Phenytoin Sodium Extended]   . Levetiracetam Nausea Only    Nausea developed while on this med, worsened when dose increased.  Nausea ceased when med was d/cd.  Marland Kitchen Penicillins Hives  . Phenytoin Sodium Extended Other (See Comments)    unknown    Patient Measurements:   Heparin Dosing Weight:   Vital Signs: Temp: 99.4 F (37.4 C) (08/27 1521) Temp Source: Rectal (08/27 1521) BP: 199/102 mmHg (08/27 1930) Pulse Rate: 95 (08/27 1930)  Labs:  Recent Labs  05/21/15 1557  HGB 15.4*  HCT 46.8*  PLT 281  LABPROT 25.7*  INR 2.38*  CREATININE 1.16*    CrCl cannot be calculated (Unknown ideal weight.).   Medical History: Past Medical History  Diagnosis Date  . Depressive disorder, not elsewhere classified   . Atrial fibrillation   . Unspecified transient cerebral ischemia   . Unspecified cerebral artery occlusion with cerebral infarction   . Unspecified essential hypertension   . Diverticulosis of colon (without mention of hemorrhage)   . Stricture and stenosis of esophagus   . Seizure disorder   . GERD (gastroesophageal reflux disease)   . IBS (irritable bowel syndrome)   . Stroke   . CHF (congestive heart failure)   . Anemia   . Hyperlipidemia   . Arrhythmia   . Hx: UTI (urinary tract infection)   . Seizures     Medications:   (Not in a hospital admission) Scheduled:  . cloNIDine  0.2 mg Transdermal Weekly  . labetalol  10 mg Intravenous Once  . nitroGLYCERIN  0.2 mg Transdermal Daily  . sodium chloride  3 mL Intravenous Q12H   Infusions:  . diltiazem (CARDIZEM) infusion    . potassium chloride    . [START ON 05/22/2015] valproate sodium    . valproate sodium       Assessment: 79yo female with history of Afib on warfarin, CVA, CHF and seizures d/o presents with elevated BP. Pharmacy is consulted to dose heparin when INR < 2.0 secondary to severe dysphagia. INR 2.4, Hgb 15.4, Plt wnl, sCr 1.2.  Goal of Therapy:  Heparin level 0.3-0.7 units/ml Monitor platelets by anticoagulation protocol: Yes   Plan:  F/u AM INR to start heparin Continue to monitor H&H and platelets  Andrey Cota. Diona Foley, PharmD Clinical Pharmacist Pager [phone] 05/21/2015,8:10 PM

## 2015-05-22 ENCOUNTER — Inpatient Hospital Stay (HOSPITAL_COMMUNITY): Payer: Medicare Other

## 2015-05-22 DIAGNOSIS — L8990 Pressure ulcer of unspecified site, unspecified stage: Secondary | ICD-10-CM | POA: Insufficient documentation

## 2015-05-22 LAB — CBC
HEMATOCRIT: 43.8 % (ref 36.0–46.0)
HEMOGLOBIN: 14.4 g/dL (ref 12.0–15.0)
MCH: 33.4 pg (ref 26.0–34.0)
MCHC: 32.9 g/dL (ref 30.0–36.0)
MCV: 101.6 fL — AB (ref 78.0–100.0)
PLATELETS: 250 10*3/uL (ref 150–400)
RBC: 4.31 MIL/uL (ref 3.87–5.11)
RDW: 13.7 % (ref 11.5–15.5)
WBC: 16.2 10*3/uL — AB (ref 4.0–10.5)

## 2015-05-22 LAB — GLUCOSE, CAPILLARY: GLUCOSE-CAPILLARY: 127 mg/dL — AB (ref 65–99)

## 2015-05-22 LAB — MAGNESIUM: MAGNESIUM: 1.8 mg/dL (ref 1.7–2.4)

## 2015-05-22 LAB — COMPREHENSIVE METABOLIC PANEL
ALT: 8 U/L — AB (ref 14–54)
AST: 54 U/L — ABNORMAL HIGH (ref 15–41)
Albumin: 2.7 g/dL — ABNORMAL LOW (ref 3.5–5.0)
Alkaline Phosphatase: 85 U/L (ref 38–126)
Anion gap: 12 (ref 5–15)
BUN: 22 mg/dL — ABNORMAL HIGH (ref 6–20)
CHLORIDE: 108 mmol/L (ref 101–111)
CO2: 24 mmol/L (ref 22–32)
CREATININE: 0.89 mg/dL (ref 0.44–1.00)
Calcium: 8.4 mg/dL — ABNORMAL LOW (ref 8.9–10.3)
GFR calc non Af Amer: >60 mL/min (ref >60)
Glucose, Bld: 141 mg/dL — ABNORMAL HIGH (ref 65–99)
POTASSIUM: 4.8 mmol/L (ref 3.5–5.1)
SODIUM: 144 mmol/L (ref 135–145)
Total Bilirubin: 1.8 mg/dL — ABNORMAL HIGH (ref 0.3–1.2)
Total Protein: 6.2 g/dL — ABNORMAL LOW (ref 6.5–8.1)

## 2015-05-22 LAB — BRAIN NATRIURETIC PEPTIDE: B NATRIURETIC PEPTIDE 5: 509.7 pg/mL — AB (ref 0.0–100.0)

## 2015-05-22 LAB — MRSA PCR SCREENING: MRSA BY PCR: NEGATIVE

## 2015-05-22 LAB — TROPONIN I
TROPONIN I: 0.04 ng/mL — AB (ref <0.031)
Troponin I: <0.03 ng/mL (ref <0.031)

## 2015-05-22 LAB — LACTIC ACID, PLASMA: Lactic Acid, Venous: 2 mmol/L (ref 0.5–2.0)

## 2015-05-22 LAB — PROTIME-INR
INR: 3.56 — ABNORMAL HIGH (ref 0.00–1.49)
Prothrombin Time: 34.8 seconds — ABNORMAL HIGH (ref 11.6–15.2)

## 2015-05-22 MED ORDER — DEXTROSE 5 % IV SOLN
1.0000 g | INTRAVENOUS | Status: DC
  Administered 2015-05-23: 1 g via INTRAVENOUS
  Filled 2015-05-22: qty 10

## 2015-05-22 MED ORDER — HYDRALAZINE HCL 20 MG/ML IJ SOLN
5.0000 mg | Freq: Three times a day (TID) | INTRAMUSCULAR | Status: DC
Start: 2015-05-22 — End: 2015-05-22

## 2015-05-22 MED ORDER — NITROGLYCERIN 0.2 MG/HR TD PT24
0.2000 mg | MEDICATED_PATCH | Freq: Every day | TRANSDERMAL | Status: DC
  Administered 2015-05-22 – 2015-05-23 (×3): 0.2 mg via TRANSDERMAL
  Filled 2015-05-22 (×3): qty 1

## 2015-05-22 MED ORDER — METOPROLOL TARTRATE 1 MG/ML IV SOLN
5.0000 mg | INTRAVENOUS | Status: DC | PRN
  Administered 2015-05-22: 5 mg via INTRAVENOUS
  Filled 2015-05-22: qty 5

## 2015-05-22 MED ORDER — CHLORHEXIDINE GLUCONATE 0.12 % MT SOLN
15.0000 mL | Freq: Two times a day (BID) | OROMUCOSAL | Status: DC
  Administered 2015-05-22 – 2015-05-23 (×2): 15 mL via OROMUCOSAL
  Filled 2015-05-22 (×3): qty 15

## 2015-05-22 MED ORDER — WHITE PETROLATUM GEL
Status: AC
  Administered 2015-05-22: 08:00:00
  Filled 2015-05-22: qty 1

## 2015-05-22 MED ORDER — SODIUM CHLORIDE 0.9 % IV SOLN
500.0000 mg | Freq: Two times a day (BID) | INTRAVENOUS | Status: DC
  Administered 2015-05-23 (×2): 500 mg via INTRAVENOUS
  Filled 2015-05-22 (×2): qty 500

## 2015-05-22 MED ORDER — CETYLPYRIDINIUM CHLORIDE 0.05 % MT LIQD
7.0000 mL | Freq: Two times a day (BID) | OROMUCOSAL | Status: DC
  Administered 2015-05-22 – 2015-05-23 (×3): 7 mL via OROMUCOSAL

## 2015-05-22 MED ORDER — LABETALOL HCL 5 MG/ML IV SOLN
10.0000 mg | INTRAVENOUS | Status: DC | PRN
  Administered 2015-05-22 (×2): 10 mg via INTRAVENOUS
  Filled 2015-05-22 (×2): qty 4

## 2015-05-22 MED ORDER — HYDRALAZINE HCL 20 MG/ML IJ SOLN
10.0000 mg | INTRAMUSCULAR | Status: DC | PRN
  Administered 2015-05-22: 10 mg via INTRAVENOUS
  Filled 2015-05-22: qty 1

## 2015-05-22 NOTE — Progress Notes (Signed)
   05/22/15 0345  Vitals  BP (!) 99/43 mmHg  MAP (mmHg) 57  Pulse Rate 68  ECG Heart Rate 71  Resp 15  Cardizem gtt stopped

## 2015-05-22 NOTE — Progress Notes (Signed)
ANTICOAGULATION CONSULT NOTE - Follow Up Consult  Pharmacy Consult for heparin once INR <2 Indication: atrial fibrillation  Allergies  Allergen Reactions  . Ciprofloxacin Other (See Comments)    diarrhea/yeast infection  . Codeine Other (See Comments)    unknown  . Dilantin [Phenytoin Sodium Extended]   . Levetiracetam Nausea Only    Nausea developed while on this med, worsened when dose increased.  Nausea ceased when med was d/cd.  Marland Kitchen Penicillins Hives  . Phenytoin Sodium Extended Other (See Comments)    unknown    Patient Measurements: Height: 5\' 2"  (157.5 cm) IBW/kg (Calculated) : 50.1 Heparin Dosing Weight:   Vital Signs: Temp: 98.7 F (37.1 C) (08/28 0500) Temp Source: Oral (08/28 0500) BP: 216/98 mmHg (08/28 0730) Pulse Rate: 98 (08/28 0730)  Labs:  Recent Labs  05/21/15 1557 05/21/15 2115 05/22/15 0135 05/22/15 0730  HGB 15.4*  --   --  14.4  HCT 46.8*  --   --  43.8  PLT 281  --   --  250  APTT  --  35  --   --   LABPROT 25.7*  --   --  34.8*  INR 2.38*  --   --  3.56*  CREATININE 1.16*  --   --  0.89  TROPONINI  --  0.05* 0.04*  --     CrCl cannot be calculated (Unknown ideal weight.).  Assessment:  79yo female with history of Afib on warfarin, CVA, CHF and seizures d/o presents with elevated BP. Pharmacy is consulted to dose heparin when INR < 2.0 secondary to severe dysphagia. INR 2.38>3.56, hgb 14.4, plts 250.  Goal of Therapy:  Heparin level 0.3-0.7 units/ml Monitor platelets by anticoagulation protocol: Yes   Plan:  -Will hold warfarin due to dysphagia -Monitor daily INR and start heparin once INR <2 -Continue to monitor CBC and S&S of bleed  Angela Burke, PharmD Pharmacy Resident Pager: [phone] 05/22/2015,8:51 AM

## 2015-05-22 NOTE — Progress Notes (Signed)
Dr. Doyle Askew returned page. New orders received for Metoprolol 5mg  IV Q4h PRN for HR >110. Keep Labetolol PRN as ordered for HTN.

## 2015-05-22 NOTE — Progress Notes (Signed)
Dr. Doyle Askew notified that patient's HR Afib 120s sustained on Cardizem gtt at 15mg /hr.

## 2015-05-22 NOTE — Progress Notes (Addendum)
Patient ID: [name], female   DOB: Dec 09, 1935, 79 y.o.   MRN: [id number] [facility] PROGRESS NOTE  [name] VEH:[id number] DOB: Jun 27, 1936 DOA: 05/21/2015 PCP: Cari Caraway, MD   Brief narrative:    79 y.o. female with PMH of esophageal stricture, stroke with right-sided weakness, TIA, atrial fibrillation on Coumadin, seizure, diastolic congestive heart failure, severe dementia, CKD-III, who presents with the aspiration and elevated bp.Pt apparently aspirated while taking oral medications and has been somnolent since.   In ED, patient was found to have elevated Bp 255/112, INR 2.38, subtherapeutic valproic acid level at 40, potassium 3.3, negative urinalysis, stable renal function, negative chest x-ray, negative CT -head for acute abnormalities, temperature 99.4, mildly tachycardia. Patient is admitted to inpatient for further evaluation and treatment.  Assessment/Plan:    Principal Problem:   Hypertensive urgency - BP better this AM - currently on clonidine and cardizem drip - will add hydralazine injection as needed - hold off on oral medications for now until SLP done  Active Problems:   Aspiration - pt with known aphagia and esophageal stricture - SLP requested   - nutritionist consulted    A-fib with RVR, CHDS2 = 7 - on cardizem drip - added metoprolol as needed - pt already on coumadin but was transitioned to heparin as unable to take PO - INR 3.56 - if rare uncontrolled, will consider amiodarone or digoxin    History of CVA (cerebrovascular accident)  - with aphasia at baseline but much worse now - will need PT/OT/SLP    Chronic diastolic CHF (congestive heart failure) - no signs of volume overload on exam, no signs of pulmonary vascular congestion on CXR - monitor daily weights, strict I/O - weight is 65 kg today    Seizure - history of seizures, continue valproate IV for now - seizure precautions    Depression and anxiety - hold home oral meds -  Ativan prn for agitation   CKD-III - Baseline creatinine 1.0-1.2, her creatinine at baseline. - Follow-up Up renal functions by BMP   Leukocytosis - Etiology is not clear, urinalysis negative, chest x-ray is negative. Temperature normal.  - Patient met criteria for sepsis with leukocytosis, tachycardia and tachypnea.  - Likely due to stress-induced demargination. Cannot rule out early aspiration pneumonia. - blood culture pending  - continue IV clindamycin day #2 for now  - follow upon procalcitonin and lactic acid    Hypernatremia - resolved with IVF - repeat BMP in AM  Code Status: DNR Family Communication:  Left message on the phone for family member  Disposition Plan: Keep in SDU   IV access:  Peripheral IV  Procedures and diagnostic studies:    Dg Chest 2 View 05/21/2015   No active cardiopulmonary disease.     Ct Head Wo Contrast 05/21/2015  Redemonstration of encephalomalacia involving the left frontal lobe. 2. Severe cerebral atrophy and small vessel ischemic change with remote right basal ganglia lacunar infarct. 3. No convincing evidence of acute superimposed process.     Medical Consultants:  None  Other Consultants:  PT/OT/SLP  IAnti-Infectives:   Clindamycin 8/27 -->  Faye Ramsay, MD  [facility] Pager [phone]  If 7PM-7AM, please contact night-coverage [email or web address] Password [email or web address] 05/22/2015, 3:34 PM   LOS: 1 day   HPI/Subjective: No events overnight.   Objective: Filed Vitals:   05/22/15 1240 05/22/15 1300 05/22/15 1400 05/22/15 1445  BP: 175/85  161/95 188/82  Pulse: 101  101 108  Temp:  99.8 F (37.7 C)  TempSrc:  Oral    Resp: 15  17 20   Height:      SpO2: 95%  95% 95%    Intake/Output Summary (Last 24 hours) at 05/22/15 1534 Last data filed at 05/22/15 1500  Gross per 24 hour  Intake 1492.58 ml  Output    150 ml  Net 1342.58 ml    Exam:   General:  Pt is alert, does not follow any commands  Cardiovascular: Irregular rate  and rhythm,  no rubs, no gallops  Respiratory: Clear to auscultation bilaterally, diminished breath sounds at bases   Abdomen: Soft, non tender, non distended, bowel sounds present, no guarding  Extremities: pulses DP and PT palpable bilaterally   Data Reviewed: Basic Metabolic Panel:  Recent Labs Lab 05/21/15 1557 05/22/15 0730  NA 148* 144  K 3.3* 4.8  CL 107 108  CO2 30 24  GLUCOSE 111* 141*  BUN 25* 22*  CREATININE 1.16* 0.89  CALCIUM 9.4 8.4*  MG  --  1.8   Liver Function Tests:  Recent Labs Lab 05/22/15 0730  AST 54*  ALT 8*  ALKPHOS 85  BILITOT 1.8*  PROT 6.2*  ALBUMIN 2.7*   No results for input(s): LIPASE, AMYLASE in the last 168 hours. No results for input(s): AMMONIA in the last 168 hours. CBC:  Recent Labs Lab 05/21/15 1557 05/22/15 0730  WBC 14.9* 16.2*  NEUTROABS 13.0*  --   HGB 15.4* 14.4  HCT 46.8* 43.8  MCV 101.3* 101.6*  PLT 281 250   Cardiac Enzymes:  Recent Labs Lab 05/21/15 2115 05/22/15 0135 05/22/15 0817  TROPONINI 0.05* 0.04* <0.03   BNP: Invalid input(s): POCBNP CBG:  Recent Labs Lab 05/22/15 0745  GLUCAP 127*    Recent Results (from the past 240 hour(s))  MRSA PCR Screening     Status: None   Collection Time: 05/22/15  1:19 AM  Result Value Ref Range Status   MRSA by PCR NEGATIVE NEGATIVE Final    Comment:        The GeneXpert MRSA Assay (FDA approved for NASAL specimens only), is one component of a comprehensive MRSA colonization surveillance program. It is not intended to diagnose MRSA infection nor to guide or monitor treatment for MRSA infections.      Scheduled Meds: . clindamycin (CLEOCIN) IV  600 mg Intravenous Q8H  . cloNIDine  0.2 mg Transdermal Weekly  . famotidine (PEPCID) IV  20 mg Intravenous Q12H  . nitroGLYCERIN  0.2 mg Transdermal Daily  . sodium chloride  3 mL Intravenous Q12H  . valproate sodium  500 mg Intravenous Q12H   Continuous Infusions: . sodium chloride 75 mL/hr at  05/22/15 0806  . diltiazem (CARDIZEM) infusion 5 mg/hr (05/22/15 1500)

## 2015-05-22 NOTE — Care Management Note (Signed)
Case Management Note  Patient Details  Name: [name] MRN: [id number] Date of Birth: 1936-05-31  Subjective/Objective:        Admitted from SNF/REHAB with  Aspiration and hypertensive urgency,   PMH of esophageal stricture, stroke with right-sided weakness, TIA, atrial fibrillation on Coumadin, seizure, diastolic congestive heart failure, severe dementia, and CKD-III.   Action/Plan: Discharge planning  Expected Discharge Date:       unknown           Expected Discharge Plan:  Skilled Nursing Facility  In-House Referral:  Clinical Social Work  Discharge planning Services  CM Consult  Post Acute Care Choice:    Choice offered to:     DME Arranged:    DME Agency:     HH Arranged:    [place] Agency:     Status of Service:  In process, will continue to follow  Medicare Important Message Given:    Date Medicare IM Given:    Medicare IM give by:    Date Additional Medicare IM Given:    Additional Medicare Important Message give by:     If discussed at [place] of Stay Meetings, dates discussed:    Additional Comments: [name] (Daughter)[phone], Mirenda Baltazar [facility])  [phone]  [place], [place] [phone] 05/22/2015, 9:30 PM

## 2015-05-22 NOTE — Progress Notes (Signed)
PT Cancellation Note  Patient Details Name: [name] MRN: [id number] DOB: 31-Jan-1936   Cancelled Treatment:    Reason Eval/Treat Not Completed: Medical issues which prohibited therapy (pt on strict bedrest and await increased activity order)   Lanetta Inch Beth 05/22/2015, 7:36 AM Elwyn Reach, [place]

## 2015-05-22 NOTE — Progress Notes (Signed)
   05/22/15 0145  Vitals  BP (!) 202/82 mmHg  MAP (mmHg) 114  Pulse Rate 90  ECG Heart Rate 96  Resp 14  paged Dr. Blaine Hamper about pt's BP new orders received will continue to monitor.

## 2015-05-22 NOTE — Progress Notes (Signed)
Second page to Dr. Doyle Askew due to patient sustained HR 120s Afib on Cardizem drip maxed to titration.

## 2015-05-22 NOTE — Progress Notes (Signed)
ANTIBIOTIC CONSULT NOTE - INITIAL  Pharmacy Consult for vancomycin Indication: bacteremia  Allergies  Allergen Reactions  . Ciprofloxacin Other (See Comments)    diarrhea/yeast infection  . Codeine Other (See Comments)    unknown  . Dilantin [Phenytoin Sodium Extended]   . Levetiracetam Nausea Only    Nausea developed while on this med, worsened when dose increased.  Nausea ceased when med was d/cd.  Marland Kitchen Penicillins Hives  . Phenytoin Sodium Extended Other (See Comments)    unknown    Patient Measurements: Height: 5\' 2"  (157.5 cm) Weight: 144 lb 2.9 oz (65.4 kg) IBW/kg (Calculated) : 50.1  Vital Signs: Temp: 98 F (36.7 C) (08/28 2300) Temp Source: Axillary (08/28 2300) BP: 149/67 mmHg (08/28 2300) Pulse Rate: 89 (08/28 2315)  Labs:  Recent Labs  05/21/15 1557 05/22/15 0730  WBC 14.9* 16.2*  HGB 15.4* 14.4  PLT 281 250  CREATININE 1.16* 0.89   Estimated Creatinine Clearance: 47.7 mL/min (by C-G formula based on Cr of 0.89).   Microbiology: Recent Results (from the past 720 hour(s))  Urine culture     Status: None   Collection Time: 04/23/15  8:50 AM  Result Value Ref Range Status   Specimen Description URINE, CATHETERIZED  Final   Special Requests NONE  Final   Culture NO GROWTH 1 DAY  Final   Report Status 04/24/2015 FINAL  Final  MRSA PCR Screening     Status: None   Collection Time: 04/23/15 12:44 PM  Result Value Ref Range Status   MRSA by PCR NEGATIVE NEGATIVE Final    Comment:        The GeneXpert MRSA Assay (FDA approved for NASAL specimens only), is one component of a comprehensive MRSA colonization surveillance program. It is not intended to diagnose MRSA infection nor to guide or monitor treatment for MRSA infections.   Culture, blood (x 2)     Status: None   Collection Time: 05/21/15  9:20 PM  Result Value Ref Range Status   Specimen Description BLOOD LEFT HAND  Final   Special Requests BOTTLES DRAWN AEROBIC AND ANAEROBIC 3CC   Final   Culture  Setup Time   Final    GRAM POSITIVE COCCI IN CLUSTERS ANAEROBIC BOTTLE ONLY CRITICAL RESULT CALLED TO, READ BACK BY AND VERIFIED WITH: Loraine Leriche [id number]    Culture NO GROWTH < 24 HOURS  Final   Report Status 05/22/2015 FINAL  Final  Culture, blood (x 2)     Status: None (Preliminary result)   Collection Time: 05/21/15 10:18 PM  Result Value Ref Range Status   Specimen Description BLOOD LEFT HAND  Final   Special Requests BOTTLES DRAWN AEROBIC ONLY 5CC  Final   Culture NO GROWTH < 24 HOURS  Final   Report Status PENDING  Incomplete  MRSA PCR Screening     Status: None   Collection Time: 05/22/15  1:19 AM  Result Value Ref Range Status   MRSA by PCR NEGATIVE NEGATIVE Final    Comment:        The GeneXpert MRSA Assay (FDA approved for NASAL specimens only), is one component of a comprehensive MRSA colonization surveillance program. It is not intended to diagnose MRSA infection nor to guide or monitor treatment for MRSA infections.     Medical History: Past Medical History  Diagnosis Date  . Depressive disorder, not elsewhere classified   . Atrial fibrillation   . Unspecified transient cerebral ischemia   . Unspecified cerebral artery occlusion  with cerebral infarction   . Unspecified essential hypertension   . Diverticulosis of colon (without mention of hemorrhage)   . Stricture and stenosis of esophagus   . Seizure disorder   . GERD (gastroesophageal reflux disease)   . IBS (irritable bowel syndrome)   . Stroke   . CHF (congestive heart failure)   . Anemia   . Hyperlipidemia   . Arrhythmia   . Hx: UTI (urinary tract infection)   . Seizures     Medications:  Prescriptions prior to admission  Medication Sig Dispense Refill Last Dose  . acetaminophen (TYLENOL) 500 MG tablet Take 500 mg by mouth every 4 (four) hours as needed (pain).    Past Month at Unknown time  . atorvastatin (LIPITOR) 40 MG tablet Take 40 mg by mouth daily at 6 PM.     05/20/2015  . carbamazepine (TEGRETOL) 100 MG chewable tablet Chew 100 mg by mouth 3 (three) times daily.   05/21/2015 at Unknown time  . Cholecalciferol 2000 UNITS TABS Take 4,000 Units by mouth daily.   05/21/2015 at Unknown time  . citalopram (CELEXA) 20 MG tablet Take 20 mg by mouth daily.   05/21/2015 at Unknown time  . cloNIDine (CATAPRES) 0.2 MG tablet Take 1 tablet (0.2 mg total) by mouth 3 (three) times daily. 90 tablet 0 05/21/2015 at Unknown time  . conjugated estrogens (PREMARIN) vaginal cream Place 1 Applicatorful vaginally at bedtime. Apply  small amount on urethra daily   05/20/2015 at Unknown time  . diltiazem (CARDIZEM CD) 120 MG 24 hr capsule Take 120 mg by mouth daily. Take on an empty stomach   05/21/2015 at Unknown time  . food thickener (THICK IT) POWD Take 1 g by mouth as needed (as needed to thinken liquids). 1020 g 0 Past Month at Unknown time  . hydrocortisone (PROCTO-PAK) 1 % CREA Apply topically at bedtime as needed (for blood in stool).   Past Month at Unknown time  . lisinopril (PRINIVIL,ZESTRIL) 40 MG tablet Take 40 mg by mouth daily.    05/21/2015 at Unknown time  . loperamide (IMODIUM A-D) 2 MG tablet Take 2 mg by mouth as needed for diarrhea or loose stools.   Past Month at Unknown time  . LORazepam (ATIVAN) 0.5 MG tablet Take 1 tablet (0.5 mg total) by mouth at bedtime. 30 tablet 0 05/21/2015 at Unknown time  . metoprolol (LOPRESSOR) 100 MG tablet Take 1 tablet (100 mg total) by mouth 2 (two) times daily.   05/21/2015 at 0800  . mirtazapine (REMERON) 15 MG tablet Take 15 mg by mouth at bedtime.   05/20/2015  . ondansetron (ZOFRAN) 8 MG tablet Take 8 mg by mouth every 8 (eight) hours as needed for nausea or vomiting.   Past Month at Unknown time  . oxyCODONE-acetaminophen (PERCOCET) 5-325 MG per tablet Take 1 tablet by mouth every 6 (six) hours as needed for moderate pain or severe pain. 120 tablet 0 05/21/2015 at Unknown time  . Protein (PROCEL PO) Take 1 scoop by mouth 2 (two)  times daily.    05/21/2015 at Unknown time  . saccharomyces boulardii (FLORASTOR) 250 MG capsule Take 250 mg by mouth 2 (two) times daily.    05/21/2015 at Unknown time  . valproic acid (DEPAKENE) 250 MG capsule Take 2 capsules (500 mg total) by mouth 2 (two) times daily. 60 capsule 0 05/21/2015 at Unknown time   Scheduled:  . antiseptic oral rinse  7 mL Mouth Rinse q12n4p  . [START ON  05/23/2015] cefTRIAXone (ROCEPHIN)  IV  1 g Intravenous Q24H  . chlorhexidine  15 mL Mouth Rinse BID  . cloNIDine  0.2 mg Transdermal Weekly  . famotidine (PEPCID) IV  20 mg Intravenous Q12H  . nitroGLYCERIN  0.2 mg Transdermal Daily  . sodium chloride  3 mL Intravenous Q12H  . valproate sodium  500 mg Intravenous Q12H   Infusions:  . sodium chloride 75 mL/hr at 05/22/15 0806  . diltiazem (CARDIZEM) infusion 15 mg/hr (05/22/15 1900)    Assessment: 79yo female admitted yesterday for aspiration and hypertension, now w/ blood cx showing GPC in clusters, to broaden IV ABX.  Goal of Therapy:  Vancomycin trough level 15-20 mcg/ml  Plan:  Will begin vancomycin 500mg  IV Q12H and monitor CBC, Cx, levels prn.  Wynona Neat, PharmD, BCPS  05/22/2015,11:57 PM

## 2015-05-22 NOTE — Evaluation (Signed)
Clinical/Bedside Swallow Evaluation Patient Details  Name: [name] MRN: [id number] Date of Birth: 20-May-1936  Today's Date: 05/22/2015 Time: SLP Start Time (ACUTE ONLY): 0751 SLP Stop Time (ACUTE ONLY): 0816 SLP Time Calculation (min) (ACUTE ONLY): 25 min  Past Medical History:  Past Medical History  Diagnosis Date  . Depressive disorder, not elsewhere classified   . Atrial fibrillation   . Unspecified transient cerebral ischemia   . Unspecified cerebral artery occlusion with cerebral infarction   . Unspecified essential hypertension   . Diverticulosis of colon (without mention of hemorrhage)   . Stricture and stenosis of esophagus   . Seizure disorder   . GERD (gastroesophageal reflux disease)   . IBS (irritable bowel syndrome)   . Stroke   . CHF (congestive heart failure)   . Anemia   . Hyperlipidemia   . Arrhythmia   . Hx: UTI (urinary tract infection)   . Seizures    Past Surgical History:  Past Surgical History  Procedure Laterality Date  . Hemorrhoid surgery    . Benign braintumor      Removal  . Flexible sigmoidoscopy  09/02/2011    Procedure: FLEXIBLE SIGMOIDOSCOPY;  Surgeon: Zenovia Jarred, MD;  Location: [facility] ENDOSCOPY;  Service: Gastroenterology;  Laterality: N/A;  . Hip arthroplasty  11/26/2011    Procedure: ARTHROPLASTY BIPOLAR HIP;  Surgeon: Alta Corning, MD;  Location: [facility] ORS;  Service: Orthopedics;  Laterality: Right;   HPI:  HPI: [name] is a 79 y.o. female with PMH of esophageal stricture, stroke with right-sided weakness, aphasia, TIA, atrial fibrillation on Coumadin, seizure, diastolic congestive heart failure, severe dementia, CKD-III, who presents with the aspiration and elevated bp. Pt with history of dypshagia, per RN notes son reports recent swallow study (not done at [facility]) with baseline diet prior to hospitalization honey thick liquids and puree. Chest x ray and CT of head negative for acute abnormalities.    Assessment / Plan /  Recommendation Clinical Impression  Pt with notable decline in swallow function since last hospitalization one month ago. Trials of dysphagia 1 (puree) consistencies ellicited multiple signs and symptoms of poor airway protection. Swallow initiation appearing delayed with symptoms including  delayed coughing, change in vital signs, and watery eyes.  Recommend MBS prior to initiation of a diet secondary to multiple risk factors for aspiration. Will attempt MBS this morning.        Aspiration Risk  Moderate    Diet Recommendation NPO   Medication Administration: Via alternative means    Other  Recommendations Oral Care Recommendations: Oral care QID   Follow Up Recommendations       Frequency and Duration min 2x/week  2 weeks   Pertinent Vitals/Pain     SLP Swallow Goals     Swallow Study Prior Functional Status       General Date of Onset: 05/21/15 Other Pertinent Information: HPI: [name] is a 79 y.o. female with PMH of esophageal stricture, stroke with right-sided weakness, aphasia, TIA, atrial fibrillation on Coumadin, seizure, diastolic congestive heart failure, severe dementia, CKD-III, who presents with the aspiration and elevated bp. Pt with history of dypshagia, per RN notes son reports recent swallow study (not done at [facility]) with baseline diet prior to hospitalization honey thick liquids and puree. Chest x ray and CT of head negative for acute abnormalities.  Type of Study: Bedside swallow evaluation Previous Swallow Assessment: none at [facility] on file, most recent BSE dys 2, nectar thick  Diet Prior to  this Study: NPO Temperature Spikes Noted: Yes Respiratory Status: Room air History of Recent Intubation: No Behavior/Cognition: Confused;Doesn't follow directions;Requires cueing Oral Cavity - Dentition: Poor condition Self-Feeding Abilities: Total assist Patient Positioning: Upright in bed Baseline Vocal Quality: Not observed Volitional Cough: Strong Volitional  Swallow: Unable to elicit    Oral/Motor/Sensory Function     Ice Chips Ice chips: Not tested   Thin Liquid Thin Liquid: Not tested    Nectar Thick Nectar Thick Liquid: Not tested   Honey Thick Honey Thick Liquid: Not tested   Puree Puree: Impaired Presentation: Spoon Oral Phase Impairments: Reduced lingual movement/coordination;Impaired anterior to posterior transit;Impaired mastication Oral Phase Functional Implications: Prolonged oral transit;Oral residue Pharyngeal Phase Impairments: Suspected delayed Swallow;Cough - Delayed;Throat Clearing - Immediate;Change in Vital Signs;Cough - Immediate;Multiple swallows;Wet Vocal Quality   Solid   GO    Solid: Not tested      Arvil Chaco MA, CCC-SLP Acute Care Speech Language Pathologist    Arvil Chaco E 05/22/2015,8:24 AM

## 2015-05-22 NOTE — Progress Notes (Signed)
MBS report now available under imaging section of the notes.   Terrilyn Tyner Sumney MA, CCC-SLP Acute Care Speech Language Pathologist    

## 2015-05-23 LAB — CBC
HEMATOCRIT: 42.3 % (ref 36.0–46.0)
Hemoglobin: 13.8 g/dL (ref 12.0–15.0)
MCH: 32.9 pg (ref 26.0–34.0)
MCHC: 32.6 g/dL (ref 30.0–36.0)
MCV: 101 fL — ABNORMAL HIGH (ref 78.0–100.0)
Platelets: 258 10*3/uL (ref 150–400)
RBC: 4.19 MIL/uL (ref 3.87–5.11)
RDW: 14.1 % (ref 11.5–15.5)
WBC: 11.6 10*3/uL — AB (ref 4.0–10.5)

## 2015-05-23 LAB — BASIC METABOLIC PANEL
Anion gap: 12 (ref 5–15)
BUN: 18 mg/dL (ref 6–20)
CHLORIDE: 112 mmol/L — AB (ref 101–111)
CO2: 23 mmol/L (ref 22–32)
CREATININE: 0.92 mg/dL (ref 0.44–1.00)
Calcium: 8.2 mg/dL — ABNORMAL LOW (ref 8.9–10.3)
GFR calc non Af Amer: 59 mL/min — ABNORMAL LOW (ref >60)
Glucose, Bld: 123 mg/dL — ABNORMAL HIGH (ref 65–99)
POTASSIUM: 2.8 mmol/L — AB (ref 3.5–5.1)
Sodium: 147 mmol/L — ABNORMAL HIGH (ref 135–145)

## 2015-05-23 LAB — PROTIME-INR
INR: 4.52 — AB (ref 0.00–1.49)
PROTHROMBIN TIME: 41.7 s — AB (ref 11.6–15.2)

## 2015-05-23 LAB — GLUCOSE, CAPILLARY: Glucose-Capillary: 110 mg/dL — ABNORMAL HIGH (ref 65–99)

## 2015-05-23 MED ORDER — LABETALOL HCL 5 MG/ML IV SOLN
10.0000 mg | Freq: Four times a day (QID) | INTRAVENOUS | Status: DC
Start: 2015-05-23 — End: 2015-05-23
  Administered 2015-05-23: 10 mg via INTRAVENOUS
  Filled 2015-05-23: qty 4

## 2015-05-23 MED ORDER — DEXTROSE-NACL 5-0.45 % IV SOLN: INTRAVENOUS | Status: DC

## 2015-05-23 MED ORDER — CLONIDINE HCL 0.3 MG/24HR TD PTWK: 0.3000 mg | MEDICATED_PATCH | TRANSDERMAL | Status: DC

## 2015-05-23 MED ORDER — INFLUENZA VAC SPLIT QUAD 0.5 ML IM SUSY: 0.5000 mL | PREFILLED_SYRINGE | INTRAMUSCULAR | Status: DC

## 2015-05-23 MED ORDER — MORPHINE SULFATE (PF) 2 MG/ML IV SOLN
1.0000 mg | INTRAVENOUS | Status: DC | PRN
  Administered 2015-05-24 – 2015-05-25 (×5): 2 mg via INTRAVENOUS
  Filled 2015-05-23 (×5): qty 1

## 2015-05-23 MED ORDER — POTASSIUM CHLORIDE 10 MEQ/100ML IV SOLN: 10.0000 meq | INTRAVENOUS | Status: DC

## 2015-05-23 MED ORDER — KCL IN DEXTROSE-NACL 30-5-0.45 MEQ/L-%-% IV SOLN
INTRAVENOUS | Status: DC
  Administered 2015-05-23: 13:00:00 via INTRAVENOUS
  Filled 2015-05-23 (×2): qty 1000

## 2015-05-23 MED ORDER — LORAZEPAM 2 MG/ML IJ SOLN: 0.5000 mg | INTRAMUSCULAR | Status: DC | PRN

## 2015-05-23 MED ORDER — LORAZEPAM 2 MG/ML IJ SOLN: 0.5000 mg | Freq: Four times a day (QID) | INTRAMUSCULAR | Status: DC | PRN

## 2015-05-23 MED ORDER — POTASSIUM CHLORIDE 10 MEQ/100ML IV SOLN
10.0000 meq | INTRAVENOUS | Status: AC
Start: 2015-05-23 — End: 2015-05-23
  Administered 2015-05-23 (×3): 10 meq via INTRAVENOUS
  Filled 2015-05-23 (×2): qty 100

## 2015-05-23 MED ORDER — CETYLPYRIDINIUM CHLORIDE 0.05 % MT LIQD: 7.0000 mL | OROMUCOSAL | Status: DC | PRN

## 2015-05-23 MED ORDER — DEXTROSE 5 % IV SOLN: 2.0000 g | INTRAVENOUS | Status: DC

## 2015-05-23 NOTE — Progress Notes (Signed)
Speech Language Pathology Treatment: Dysphagia  Patient Details Name: [name] MRN: [id number] DOB: Feb 02, 1936 Today's Date: 05/23/2015 Time: 1150-1200 SLP Time Calculation (min) (ACUTE ONLY): 10 min  Assessment / Plan / Recommendation Clinical Impression  Pt presented little functional change in comparison with MBS report. SLP trialed puree (apple sauce), Pt had a delayed swallow, held the bolus, and pocketed food on the right side. Pt required max verbal and physical cues to swallow. SLP presented the Pt with a dry spoon to attempt to initiate a swallow and suctioned the remaining bolus. SLP consulted MD and agreed with recommendations to consult the family and palliative care as long term prognosis for recovery of safe swallow is poor due to advanced dementia and history of aspiration. SLP will sign off, please re-consult with any concerns.   HPI Other Pertinent Information: HPI: [name] is a 79 y.o. female with PMH of esophageal stricture, stroke with right-sided weakness, aphasia, TIA, atrial fibrillation on Coumadin, seizure, diastolic congestive heart failure, severe dementia, CKD-III, who presents with the aspiration and elevated bp. Pt with history of dypshagia, per RN notes son reports recent swallow study (not done at [facility]) with baseline diet prior to hospitalization honey thick liquids and puree. Chest x ray and CT of head negative for acute abnormalities.    Pertinent Vitals Pain Assessment: Faces Pain Score: 0-No pain Faces Pain Scale: No hurt  SLP Plan  Discharge SLP treatment due to Pt's mental condition (e.g. Dementia) and baseline aspiration    Recommendations Diet recommendations: NPO Medication Administration: Via alternative means              Oral Care Recommendations: Oral care QID Plan: Discharge SLP treatment due to (comment) (Pt's mental condition (e.g. Dementia) and baseline aspiratio)    GO    Lanier Ensign, Student-SLP Lanier Ensign 05/23/2015,  12:09 PM

## 2015-05-23 NOTE — Progress Notes (Signed)
Initial Nutrition Assessment  DOCUMENTATION CODES:   Not applicable  INTERVENTION:    If unable to advance diet and supportive measures to continue, consider placing NGT for enteral nutrition support. If NGT placed, recommend Jevity 1.2 at 10 ml/h, increase by 10 ml every 4 hours to goal rate of 60 ml/h to provide 1728 kcals, 80 gm protein, 1166 ml free water daily.  Will monitor for overall goals and plan of care.  NUTRITION DIAGNOSIS:   Inadequate oral intake related to dysphagia, inability to eat as evidenced by NPO status.  GOAL:   Patient will meet greater than or equal to 90% of their needs  MONITOR:   Diet advancement, PO intake, Labs, Weight trends, Skin  REASON FOR ASSESSMENT:   Low Braden    ASSESSMENT:   79 y.o. female with PMH of esophageal stricture, stroke with right-sided weakness, TIA, atrial fibrillation on Coumadin, seizure, diastolic congestive heart failure, severe dementia, CKD-III, who presents with the aspiration and elevated bp.  Patient did not speak to RD during visit. Nutrition-Focused physical exam completed. Findings are no fat depletion, moderate and severe muscle depletion, and mild edema. S/P MBS with SLP yesterday; plans for trials of PO's with SLP only. Remains NPO at this time. Labs reviewed: sodium is elevated and potassium is low. Receiving potassium in IVF.  Diet Order:  Diet NPO time specified  Skin:  Wound (see comment) (stage II pressure ulcer to sacrum)  Last BM:  8/29  Height:   Ht Readings from Last 1 Encounters:  05/22/15 5\' 2"  (1.575 m)    Weight:   Wt Readings from Last 1 Encounters:  05/22/15 144 lb 2.9 oz (65.4 kg)    Ideal Body Weight:  50 kg  BMI:  Body mass index is 26.36 kg/(m^2).  Estimated Nutritional Needs:   Kcal:  1600-1800  Protein:  80-95 gm  Fluid:  1.6-1.8 L  EDUCATION NEEDS:   No education needs identified at this time  Molli Barrows, [place], [place], [place] Pager [phone] After Hours Pager  [phone]

## 2015-05-23 NOTE — Progress Notes (Signed)
Pt transferred from 3s to 6N09.

## 2015-05-23 NOTE — Progress Notes (Signed)
Critical lab result of: gram positive cocci in clusters. K.Schorr discontinued Clindamycin and ordered vancomycin and Rocephin. Will continue to monitor.

## 2015-05-23 NOTE — Evaluation (Addendum)
Physical Therapy Evaluation Patient Details Name: [name] MRN: [id number] DOB: 1935-12-19 Today's Date: 05/23/2015   History of Present Illness  [name] is a 79 y.o. female with PMH of esophageal stricture, stroke with right-sided weakness, TIA, atrial fibrillation on Coumadin, seizure, diastolic congestive heart failure, severe dementia, CKD-III, who presents with the aspiration and elevated bp.  Clinical Impression  Pt admitted with/for elevated bp and aspiration.  Pt currently limited functionally due to the problems listed. ( See problems list.)   Pt will benefit from PT to maximize function and safety in order to get ready for next venue listed below.     Follow Up Recommendations SNF;Other (comment) (to bridge her to a restorative nsg program)    Equipment Recommendations  None recommended by PT    Recommendations for Other Services       Precautions / Restrictions Precautions Precautions: Fall Restrictions Weight Bearing Restrictions: No      Mobility  Bed Mobility Overal bed mobility: Needs Assistance;+2 for physical assistance Bed Mobility: Rolling;Sidelying to Sit Rolling: Total assist Sidelying to sit: Total assist;+2 for physical assistance          Transfers Overall transfer level: Needs assistance Equipment used: None Transfers: Sit to/from [employer] Transfers Sit to Stand: Total assist Stand pivot transfers: Total assist;+2 physical assistance          Ambulation/Gait                Stairs            Wheelchair Mobility    Modified Rankin (Stroke Patients Only)       Balance Overall balance assessment: Needs assistance Sitting-balance support: Feet supported Sitting balance-Leahy Scale: Zero Sitting balance - Comments: Pt did not attempt to self correct balance posture when she was losing her balance   Standing balance support: No upper extremity supported Standing balance-Leahy Scale: Zero                                Pertinent Vitals/Pain Pain Assessment: Faces Faces Pain Scale: No hurt    Home Living Family/patient expects to be discharged to:: Skilled nursing facility                      Prior Function Level of Independence: Needs assistance   Gait / Transfers Assistance Needed: total A  ADL's / Homemaking Assistance Needed: total A        Hand Dominance   Dominant Hand:  (unknown)    Extremity/Trunk Assessment   Upper Extremity Assessment: RUE deficits/detail;LUE deficits/detail RUE Deficits / Details: Flexion pattern, tight, can get her elbow out to 90 degrees, palm guard in hand from SNF     LUE Deficits / Details: Flexion pattern, tight, can get her elbow out to 90 degrees, palm guard in hand from SNF   Lower Extremity Assessment: RLE deficits/detail;LLE deficits/detail RLE Deficits / Details: stiff, resistance to movement LLE Deficits / Details: resistance to movement     Communication   Communication: Expressive difficulties;Receptive difficulties  Cognition Arousal/Alertness: Awake/alert Behavior During Therapy: Flat affect Overall Cognitive Status: No family/caregiver present to determine baseline cognitive functioning (history of cognitive impairments at baseline, not sure if she is worse now or not)                      General Comments      Exercises  Assessment/Plan    PT Assessment Patient needs continued PT services  PT Diagnosis Generalized weakness   PT Problem List Decreased strength;Decreased activity tolerance;Decreased balance;Decreased mobility;Decreased coordination;Decreased cognition  PT Treatment Interventions Functional mobility training;Therapeutic activities;Balance training;Patient/family education   PT Goals (Current goals can be found in the Care Plan section) Acute Rehab PT Goals Patient Stated Goal: unable PT Goal Formulation: Patient unable to participate in goal setting Time  For Goal Achievement: 06/09/2015 Potential to Achieve Goals: Fair    Frequency Min 2X/week   Barriers to discharge        Co-evaluation PT/OT/SLP Co-Evaluation/Treatment: Yes Reason for Co-Treatment: Complexity of the patient's impairments (multi-system involvement);For patient/therapist safety PT goals addressed during session: Mobility/safety with mobility OT goals addressed during session: Strengthening/ROM       End of Session   Activity Tolerance: Patient tolerated treatment well Patient left: in chair;with call bell/phone within reach Nurse Communication: Mobility status         Time: 7116-5790 PT Time Calculation (min) (ACUTE ONLY): 22 min   Charges:   PT Evaluation $Initial PT Evaluation Tier I: 1 Procedure     PT G Codes:        Azeneth Carbonell, Tessie Fass 05/23/2015, 11:55 AM 05/23/2015  Donnella Sham, PT [phone]  (pager)

## 2015-05-23 NOTE — Progress Notes (Signed)
[facility] TEAM 1 - Stepdown/ICU TEAM PROGRESS NOTE  [name]:[id number] DOB: 1936-08-26 DOA: 05/21/2015 PCP: Cari Caraway, MD  Admit HPI / Brief Narrative: 79 y.o. female with hx of esophageal stricture, stroke with right-sided weakness, TIA, atrial fibrillation on Coumadin, seizure d/o, diastolic congestive heart failure, severe dementia, and CKD-III who presented with aspiration and an elevated BP.  Pt was reportedly observed to aspirate while taking oral medications and had been somnolent since.   In ED patient was found to have BP 255/112, INR 2.38, subtherapeutic valproic acid level at 40, potassium 3.3, negative urinalysis, stable renal function, negative chest x-ray, negative CT-head for acute abnormalities, temperature 99.4, mildly tachycardia.   HPI/Subjective: The patient is sitting up in a bedside chair.  Though she does not spontaneously interact if I engage her and pushed the issue she will respond to many of my questions.  She clearly does not understand many of the things that I say to her and cannot provide a reliable history however.  She does not appear uncomfortable and her respirations are not labored.  Assessment/Plan:  Hypertensive urgency - BP remains poorly controlled with significant upward trend over last 12 hours - adjust therapy further and follow  Aspiration - pt with known aphasia and esophageal stricture - SLP eval pending - keep NPO for now  - stop abx as CXR w/o focal infiltrate and pt not exhibiting signs of pneumonia   Chronic A-fib with acute RVR, CHA2DS2 - VASc 7 - on cardizem drip w/ reasonable control of HR at present  - pt already on coumadin but was transitioned to heparin as unable to take PO  Gram + cocci in clusters - 1 of 2 blood cx - suspicious for contamination - follow speciation - narrow to vanc alone for now  Hypernatremia  - add free water to IV - due to NPO status likely   Hypokalemia - replete via IV - check Mg - due  to NPO status   Macrocytosis - check Q25 and folic acid   History of CVA  - aphasia at baseline - NPO for now per SLP - follow  - PT/OT/SLP   Chronic diastolic CHF - no signs of volume overload on exam, no signs of pulmonary vascular congestion on CXR - monitor daily weights, strict I/O - weight is 65.4 kg today   Seizure d/o - history of seizures, continue valproate IV  - seizure precautions   Depression and anxiety - hold home oral meds - try to avoid sedating medications and follow mental status   CKD-III - Baseline creatinine 1.0-1.2, her creatinine is at baseline  Hypernatremia - resolved with IVF - repeat BMP in AM  Code Status: NO CODE  Family Communication: no family present at time of exam Disposition Plan: SDU   Consultants: none  Procedures: none  Antibiotics: Clindamycin 8/27 > 8/28 Vanc 8/28 > Rocephin 8/28  DVT prophylaxis: Warfarin > IV heparin   Objective: Blood pressure 179/97, pulse 99, temperature 98.1 F (36.7 C), temperature source Oral, resp. rate 20, height 5\' 2"  (1.575 m), weight 65.4 kg (144 lb 2.9 oz), SpO2 95 %.  Intake/Output Summary (Last 24 hours) at 05/23/15 1028 Last data filed at 05/23/15 1000  Gross per 24 hour  Intake 2470.83 ml  Output      0 ml  Net 2470.83 ml   Exam: General: No acute respiratory distress Lungs: Clear to auscultation bilaterally without wheezes or crackles Cardiovascular: Irregularly irregular with rate approximately 90 bpm without  appreciable murmur Abdomen: Nontender, nondistended, soft, bowel sounds positive, no rebound, no ascites, no appreciable mass Extremities: No significant cyanosis, clubbing, or edema bilateral lower extremities  Data Reviewed: Basic Metabolic Panel:  Recent Labs Lab 05/21/15 1557 05/22/15 0730 05/23/15 0228  NA 148* 144 147*  K 3.3* 4.8 2.8*  CL 107 108 112*  CO2 30 24 23   GLUCOSE 111* 141* 123*  BUN 25* 22* 18  CREATININE 1.16* 0.89 0.92  CALCIUM 9.4  8.4* 8.2*  MG  --  1.8  --     CBC:  Recent Labs Lab 05/21/15 1557 05/22/15 0730 05/23/15 0228  WBC 14.9* 16.2* 11.6*  NEUTROABS 13.0*  --   --   HGB 15.4* 14.4 13.8  HCT 46.8* 43.8 42.3  MCV 101.3* 101.6* 101.0*  PLT 281 250 258    Liver Function Tests:  Recent Labs Lab 05/22/15 0730  AST 54*  ALT 8*  ALKPHOS 85  BILITOT 1.8*  PROT 6.2*  ALBUMIN 2.7*    Coags:  Recent Labs Lab 05/21/15 1557 05/22/15 0730 05/23/15 0228  INR 2.38* 3.56* 4.52*    Recent Labs Lab 05/21/15 2115  APTT 35    Cardiac Enzymes:  Recent Labs Lab 05/21/15 2115 05/22/15 0135 05/22/15 0817  TROPONINI 0.05* 0.04* <0.03    CBG:  Recent Labs Lab 05/22/15 0745 05/23/15 0745  GLUCAP 127* 110*    Recent Results (from the past 240 hour(s))  Culture, blood (x 2)     Status: None (Preliminary result)   Collection Time: 05/21/15  9:20 PM  Result Value Ref Range Status   Specimen Description BLOOD LEFT HAND  Final   Special Requests BOTTLES DRAWN AEROBIC AND ANAEROBIC 3CC   Final   Culture  Setup Time   Final    GRAM POSITIVE COCCI IN CLUSTERS IN BOTH AEROBIC AND ANAEROBIC BOTTLES CRITICAL RESULT CALLED TO, READ BACK BY AND VERIFIED WITH: Loraine Leriche [id number] [address] [place]    Culture GRAM POSITIVE COCCI  Final   Report Status PENDING  Incomplete  Culture, blood (x 2)     Status: None (Preliminary result)   Collection Time: 05/21/15 10:18 PM  Result Value Ref Range Status   Specimen Description BLOOD LEFT HAND  Final   Special Requests BOTTLES DRAWN AEROBIC ONLY 5CC  Final   Culture NO GROWTH < 24 HOURS  Final   Report Status PENDING  Incomplete  MRSA PCR Screening     Status: None   Collection Time: 05/22/15  1:19 AM  Result Value Ref Range Status   MRSA by PCR NEGATIVE NEGATIVE Final    Comment:        The GeneXpert MRSA Assay (FDA approved for NASAL specimens only), is one component of a comprehensive MRSA colonization surveillance program. It is not intended to  diagnose MRSA infection nor to guide or monitor treatment for MRSA infections.      Studies:   Recent x-ray studies have been reviewed in detail by the Attending Physician  Scheduled Meds:  Scheduled Meds: . antiseptic oral rinse  7 mL Mouth Rinse q12n4p  . [START ON 05/24/2015] cefTRIAXone (ROCEPHIN)  IV  2 g Intravenous Q24H  . chlorhexidine  15 mL Mouth Rinse BID  . cloNIDine  0.2 mg Transdermal Weekly  . famotidine (PEPCID) IV  20 mg Intravenous Q12H  . [START ON 05/24/2015] Influenza vac split quadrivalent PF  0.5 mL Intramuscular Tomorrow-1000  . nitroGLYCERIN  0.2 mg Transdermal Daily  . sodium chloride  3 mL  Intravenous Q12H  . valproate sodium  500 mg Intravenous Q12H  . vancomycin  500 mg Intravenous Q12H    Time spent on care of this patient: 35 mins   Irish Breisch T , MD   [facility] Office  [phone] Pager - Text Page per Shea Evans as per below:  On-Call/Text Page:      [email or web address]      password [email or web address]  If 7PM-7AM, please contact night-coverage [email or web address] Password [email or web address] 05/23/2015, 10:28 AM   LOS: 2 days

## 2015-05-23 NOTE — Progress Notes (Signed)
ANTICOAGULATION CONSULT NOTE - Follow Up Consult  Pharmacy Consult for heparin Indication: atrial fibrillation  Allergies  Allergen Reactions  . Ciprofloxacin Other (See Comments)    diarrhea/yeast infection  . Codeine Other (See Comments)    unknown  . Dilantin [Phenytoin Sodium Extended]   . Levetiracetam Nausea Only    Nausea developed while on this med, worsened when dose increased.  Nausea ceased when med was d/cd.  Marland Kitchen Penicillins Hives  . Phenytoin Sodium Extended Other (See Comments)    unknown    Patient Measurements: Height: 5\' 2"  (157.5 cm) Weight: 144 lb 2.9 oz (65.4 kg) IBW/kg (Calculated) : 50.1 Heparin Dosing Weight:   Vital Signs: Temp: 98.1 F (36.7 C) (08/29 0800) Temp Source: Oral (08/29 0800) BP: 179/97 mmHg (08/29 0800) Pulse Rate: 99 (08/29 0800)  Labs:  Recent Labs  05/21/15 1557 05/21/15 2115 05/22/15 0135 05/22/15 0730 05/22/15 0817 05/23/15 0228  HGB 15.4*  --   --  14.4  --  13.8  HCT 46.8*  --   --  43.8  --  42.3  PLT 281  --   --  250  --  258  APTT  --  35  --   --   --   --   LABPROT 25.7*  --   --  34.8*  --  41.7*  INR 2.38*  --   --  3.56*  --  4.52*  CREATININE 1.16*  --   --  0.89  --  0.92  TROPONINI  --  0.05* 0.04*  --  <0.03  --     Estimated Creatinine Clearance: 46.2 mL/min (by C-G formula based on Cr of 0.92).  Assessment:  79yo female with history of Afib on warfarin, CVA, CHF and seizures d/o presents with elevated BP. Pharmacy is consulted to dose heparin secondary to severe dysphagia (warfarin pta). CHADS2VASC7. Current INR supratherapeutic 3.56>>4.52. Will start heparin when INR<2. CBC wnl, No bleed reported Warfarin PTA 3 mg daily.    Goal of Therapy:  Heparin level 0.3-0.7 units/ml Monitor platelets by anticoagulation protocol: Yes  INR<2 prior to heparin start   Plan:  Heparin when INR<2 Daily INR Monitor CBC, s/sx bleeding  Elicia Lamp, PharmD Clinical Pharmacist Pager [phone] 05/23/2015 8:23  AM

## 2015-05-23 NOTE — Evaluation (Signed)
Occupational Therapy Evaluation Patient Details Name: [name] MRN: [id number] DOB: 10-17-1935 Today's Date: 05/23/2015    History of Present Illness [name] is a 79 y.o. female with PMH of esophageal stricture, stroke with right-sided weakness, TIA, atrial fibrillation on Coumadin, seizure, diastolic congestive heart failure, severe dementia, CKD-III, who presents with the aspiration and elevated bp.   Clinical Impression   This 79 yo female admitted with above presents to acute OT with total A for all mobility and BADLs. Attempted to call [facility] x3 to check on pt's PLOF however could not get anyone to answer post operator transferring me. Feel pt is probably at her baseline--only follow up OT I can recommend is for positioning recommendations once she goes back to SNF.    Follow Up Recommendations  SNF (just for follow up on any positioning recommendations once she is back at [facility])    Equipment Recommendations  None recommended by OT       Precautions / Restrictions Precautions Precautions: Fall Restrictions Weight Bearing Restrictions: No      Mobility Bed Mobility Overal bed mobility: Needs Assistance;+2 for physical assistance Bed Mobility: Rolling;Sidelying to Sit Rolling: Total assist Sidelying to sit: Total assist;+2 for physical assistance          Transfers Overall transfer level: Needs assistance Equipment used: None Transfers: Sit to/from [employer] Transfers Sit to Stand: Total assist Stand pivot transfers: Total assist;+2 physical assistance            Balance Overall balance assessment: Needs assistance Sitting-balance support: Feet supported Sitting balance-Leahy Scale: Zero Sitting balance - Comments: Pt did not attempt to self correct balance posture when she was losing her balance   Standing balance support: No upper extremity supported Standing balance-Leahy Scale: Zero                              ADL  Overall ADL's : Needs assistance/impaired                                       General ADL Comments: total A     Vision Additional Comments: unknown, pt unable to tell or follow for testing. Turns head left from right to about midline with tendency to keep it to her right          Pertinent Vitals/Pain Pain Assessment: Faces Faces Pain Scale: No hurt     Hand Dominance  (unknown)   Extremity/Trunk Assessment Upper Extremity Assessment Upper Extremity Assessment: RUE deficits/detail;LUE deficits/detail RUE Deficits / Details: Flexion pattern, tight, can get her elbow out to 90 degrees, palm guard in hand from SNF RUE Coordination: decreased fine motor;decreased gross motor LUE Deficits / Details: Flexion pattern, tight, can get her elbow out to 90 degrees, palm guard in hand from SNF LUE Coordination: decreased fine motor;decreased gross motor   Lower Extremity Assessment Lower Extremity Assessment: Defer to PT evaluation       Communication Communication Communication: Expressive difficulties;Receptive difficulties   Cognition Arousal/Alertness: Awake/alert Behavior During Therapy: Flat affect Overall Cognitive Status: No family/caregiver present to determine baseline cognitive functioning (history of cognitive impairments at baseline, not sure if she is worse now or not)  Home Living Family/patient expects to be discharged to:: Skilled nursing facility                                        Prior Functioning/Environment Level of Independence: Needs assistance  Gait / Transfers Assistance Needed: total A ADL's / Homemaking Assistance Needed: total A Communication / Swallowing Assistance Needed: mute, did not see her try to nod yes or no      OT Diagnosis: Generalized weakness;Cognitive deficits   OT Problem List: Decreased strength;Decreased range of motion;Impaired tone;Impaired balance  (sitting and/or standing);Decreased cognition      OT Goals(Current goals can be found in the care plan section) Acute Rehab OT Goals Patient Stated Goal: unable  OT Frequency:             Co-evaluation PT/OT/SLP Co-Evaluation/Treatment: Yes Reason for Co-Treatment: Complexity of the patient's impairments (multi-system involvement);For patient/therapist safety   OT goals addressed during session: Strengthening/ROM      End of Session Equipment Utilized During Treatment:  (None, did leave lift pad in chair for nursing to get pt back to bed) Nurse Communication: Mobility status (pt not going to weight shift herself in chair, they will need to help her like they have been doing in bed)  Activity Tolerance: Patient tolerated treatment well Patient left: in chair;with call bell/phone within reach   Time: 1003-1025 OT Time Calculation (min): 22 min Charges:  OT General Charges $OT Visit: 1 Procedure OT Evaluation $Initial OT Evaluation Tier I: 1 Procedure  Almon Register [id number] 05/23/2015, 11:07 AM

## 2015-05-24 LAB — CULTURE, BLOOD (ROUTINE X 2)

## 2015-05-24 MED ORDER — DIVALPROEX SODIUM 125 MG PO CSDR
250.0000 mg | DELAYED_RELEASE_CAPSULE | Freq: Three times a day (TID) | ORAL | Status: DC
  Administered 2015-05-24 – 2015-05-25 (×5): 250 mg via ORAL
  Filled 2015-05-24 (×7): qty 2

## 2015-05-24 NOTE — Progress Notes (Signed)
PROGRESS NOTE  FRANKLIN CLAPSADDLE RKY:[id number] DOB: 22-Jan-1936 DOA: 05/21/2015 PCP: Cari Caraway, MD  Brief Narrative: 79 y.o. female with hx of esophageal stricture, stroke with right-sided weakness, TIA, atrial fibrillation on Coumadin, seizure d/o, diastolic congestive heart failure, severe dementia, and CKD-III who presented with aspiration and an elevated BP.  Pt was reportedly observed to aspirate while taking oral medications and had been somnolent since. After discussions with family all active treatment was withdrawn.  Subjective: Patient lying on the bed. Eyes open. Not really communicative. Does not appear to be in any pain.   Assessment/Plan:  Advanced dementia with dysphagia I spoke to Dr. Thereasa Solo this morning to discuss his conversation with the patient's daughter yesterday. Discussed in detail by Dr. Thereasa Solo with the patient's daughter, 8/29. Patient was subsequently made comfort care. Plan is to monitor the patient in the hospital for short period of time to see if she declines. Patient remains stable currently. Discussed again with her daughter today. Daughter is open to hospice care. Patient's life expectancy is likely less than 6 weeks. She would be a good candidate for residential hospice. Will wait and see how patient does over the next day or 2 before making final decisions.  Hypertensive urgency BP remains poorly controlled. Not being managed actively currently.  Aspiration Pt with known aphasia and esophageal stricture. Comfort feeds only.  Chronic A-fib with acute RVR, CHA2DS2 - VASc 7 Patient was on a Cardizem infusion. Currently heart rate is reasonably well controlled without any medications. Continue to monitor. Was on anticoagulation, which has been discontinued.  Gram + cocci in clusters - 1 of 2 blood cx Coagulase-negative staph. Aureus identified on the blood cultures. This is most likely a contaminant.  Hypernatremia/Hypokalemia No need for blood  work.  History of CVA  Aphasia at baseline. Comfort feeds.  Chronic diastolic CHF No signs of volume overload on exam, no signs of pulmonary vascular congestion on CXR. No aggressive measures.  Seizure d/o Family, very concerned about patient having another seizure. I have discussed in detail with the daughter. We will place the patient on Depakote sprinkles with the understanding that this may not prevent seizure activity.  Depression and anxiety  CKD-III Baseline creatinine 1.0-1.2, her creatinine is at baseline   Code Status: NO CODE  Family Communication: Discussed with her daughter over the phone Disposition Plan: Continue to monitor for now. Social worker to pursue residential hospice placement.   Consultants: none  Procedures: none  Antibiotics: Clindamycin 8/27 > 8/28 Vanc 8/28 >8/29 Rocephin 8/28-8/29  DVT prophylaxis: Comfort care  Objective: Blood pressure 156/86, pulse 93, temperature 98.3 F (36.8 C), temperature source Axillary, resp. rate 17, height 5\' 2"  (1.575 m), weight 66.134 kg (145 lb 12.8 oz), SpO2 96 %.  Intake/Output Summary (Last 24 hours) at 05/24/15 0836 Last data filed at 05/24/15 0817  Gross per 24 hour  Intake 651.75 ml  Output    225 ml  Net 426.75 ml   Exam: General: No acute respiratory distress Lungs: Clear to auscultation bilaterally without wheezes or crackles Cardiovascular: Irregularly irregular Abdomen: Nontender, nondistended, soft, bowel sounds positive, no rebound, no ascites, no appreciable mass Extremities: No significant edema bilateral lower extremities  Data Reviewed: Basic Metabolic Panel:  Recent Labs Lab 05/21/15 1557 05/22/15 0730 05/23/15 0228  NA 148* 144 147*  K 3.3* 4.8 2.8*  CL 107 108 112*  CO2 30 24 23   GLUCOSE 111* 141* 123*  BUN 25* 22* 18  CREATININE 1.16* 0.89 0.92  CALCIUM 9.4 8.4* 8.2*  MG  --  1.8  --     CBC:  Recent Labs Lab 05/21/15 1557 05/22/15 0730 05/23/15 0228  WBC  14.9* 16.2* 11.6*  NEUTROABS 13.0*  --   --   HGB 15.4* 14.4 13.8  HCT 46.8* 43.8 42.3  MCV 101.3* 101.6* 101.0*  PLT 281 250 258    Liver Function Tests:  Recent Labs Lab 05/22/15 0730  AST 54*  ALT 8*  ALKPHOS 85  BILITOT 1.8*  PROT 6.2*  ALBUMIN 2.7*    Coags:  Recent Labs Lab 05/21/15 1557 05/22/15 0730 05/23/15 0228  INR 2.38* 3.56* 4.52*    Recent Labs Lab 05/21/15 2115  APTT 35    Cardiac Enzymes:  Recent Labs Lab 05/21/15 2115 05/22/15 0135 05/22/15 0817  TROPONINI 0.05* 0.04* <0.03    CBG:  Recent Labs Lab 05/22/15 0745 05/23/15 0745  GLUCAP 127* 110*    Recent Results (from the past 240 hour(s))  Culture, blood (x 2)     Status: None (Preliminary result)   Collection Time: 05/21/15  9:20 PM  Result Value Ref Range Status   Specimen Description BLOOD LEFT HAND  Final   Special Requests BOTTLES DRAWN AEROBIC AND ANAEROBIC 3CC   Final   Culture  Setup Time   Final    GRAM POSITIVE COCCI IN CLUSTERS IN BOTH AEROBIC AND ANAEROBIC BOTTLES CRITICAL RESULT CALLED TO, READ BACK BY AND VERIFIED WITH: T IRBY,RN [id number] 2326 [place]    Culture   Final    STAPHYLOCOCCUS SPECIES (COAGULASE NEGATIVE) THE SIGNIFICANCE OF ISOLATING THIS ORGANISM FROM A SINGLE SET OF BLOOD CULTURES WHEN MULTIPLE SETS ARE DRAWN IS UNCERTAIN. PLEASE NOTIFY THE MICROBIOLOGY DEPARTMENT WITHIN ONE WEEK IF SPECIATION AND SENSITIVITIES ARE REQUIRED.    Report Status PENDING  Incomplete  Culture, blood (x 2)     Status: None (Preliminary result)   Collection Time: 05/21/15 10:18 PM  Result Value Ref Range Status   Specimen Description BLOOD LEFT HAND  Final   Special Requests BOTTLES DRAWN AEROBIC ONLY 5CC  Final   Culture NO GROWTH 2 DAYS  Final   Report Status PENDING  Incomplete  MRSA PCR Screening     Status: None   Collection Time: 05/22/15  1:19 AM  Result Value Ref Range Status   MRSA by PCR NEGATIVE NEGATIVE Final    Comment:        The GeneXpert MRSA Assay  (FDA approved for NASAL specimens only), is one component of a comprehensive MRSA colonization surveillance program. It is not intended to diagnose MRSA infection nor to guide or monitor treatment for MRSA infections.      Scheduled Meds:  Scheduled Meds: . divalproex  250 mg Oral 3 times per day  . sodium chloride  3 mL Intravenous Q12H    Time spent on care of this patient: 54 mins   Bonnielee Haff , MD  [place]  [phone] Pager - Text Page per Amion as per below:  On-Call/Text Page:      [email or web address]      password [email or web address]  If 7PM-7AM, please contact night-coverage [email or web address] Password [email or web address] 05/24/2015, 8:36 AM   LOS: 3 days

## 2015-05-24 NOTE — Care Management Important Message (Signed)
Important Message  Patient Details  Name: [name] MRN: [id number] Date of Birth: May 20, 1936   Medicare Important Message Given:  Yes-second notification given    Delorse Lek 05/24/2015, 2:37 PM

## 2015-05-24 NOTE — Clinical Social Work Note (Signed)
CSW was informed by bedside nurse that family may be interested in [facility].  CSW contacted patient's daughter [name] and left a message awaiting for call back.  Jones Broom. [place], MSW, [place] 05/24/2015 12:59 PM

## 2015-05-24 NOTE — Progress Notes (Signed)
Nutrition Brief Note  Chart reviewed. Pt now transitioning to comfort care.  No further nutrition interventions warranted at this time.  Please re-consult as needed.   Latroy Gaymon A. Mahira Petras, RD, LDN, CDE Pager: [phone] After hours Pager: [phone]  

## 2015-05-25 MED ORDER — DIVALPROEX SODIUM 125 MG PO CSDR: 250.0000 mg | DELAYED_RELEASE_CAPSULE | Freq: Three times a day (TID) | ORAL | Status: AC

## 2015-05-25 NOTE — Clinical Social Work Note (Addendum)
CSW contacted patient's daughter [name] to give her an update on residential hospice placement.  CSW informed her that [facility] has been given the referral, and CSW is awaiting decision.    12:20 pm CSW was informed by [facility] that patient has a bed available today for discharge if patient is medically ready and discharge order has been received.  [facility] social worker has contacted patient's daughter to complete paperwork.  CSW notified physician.  Jones Broom. [place], MSW, [place] 05/25/2015 12:26 PM

## 2015-05-25 NOTE — Consult Note (Signed)
[facility] Liaison:  Received request from [place] for family interest in [facility]. Chart reviewed and met with daughter to confirm interest and complete registration paper work. Daughter agreeable to transfer to day. She spoke with her brother and father to confirm. Dr. Orpah Melter to assume care per family request.   RN Please call report to [phone].  Please fax discharge summary to [phone].  Please arrange PTAR as soon as possible.   Thank you. Erling Conte LCSW [phone]

## 2015-05-25 NOTE — Clinical Social Work Note (Signed)
CSW received phone call back from patient's daughter [name] who stated she is interested in having patient go to [facility] for residential hospice.  CSW provided psychosocial support to patient's daughter explaining the concept of hospice, what the requirements are for residential hospice, and the different options for facilities.  CSW informed patient's daughter that [facility] worker will have to review patient's case to determine if she is eligible for residential hospice.  CSW explained to patient that if [facility] does not have any beds available that there are other residential hospice facilities and the next closest one to [place] would be [facility].  CSW asked patient's daughter if this is an acceptable second option and patient's daughter said yes.  CSW also informed patient's daughter that if they would like to go back to [facility] and patient can be followed by palliative or receive hospice at the SNF, if patient is not eligible for residential hospice and she agreed that it would be a good option as well.  CSW contacted [facility] [employer] worker and gave her the referral awaiting response back.  CSW to continue to follow patient's progress towards discharge planning.  Jones Broom. [place], MSW, [place] 05/25/2015 9:03 AM

## 2015-05-25 NOTE — Clinical Social Work Note (Signed)
Clinical Social Work Assessment  Patient Details  Name: [name] MRN: [id number] Date of Birth: 1935/10/13  Date of referral:  05/24/15               Reason for consult:  End of Life/Hospice                Permission sought to share information with:  Family Supports Permission granted to share information::  Yes, Verbal Permission Granted  Name::     [name] patient's daughter  Agency::  [facility]  Relationship::     Contact Information:     Housing/Transportation Living arrangements for the past 2 months:  [place] of Information:  Adult Children Patient Interpreter Needed:  None Criminal Activity/Legal Involvement Pertinent to Current Situation/Hospitalization:  No - Comment as needed Significant Relationships:  None Lives with:  Spouse Do you feel safe going back to the place where you live?  No (Patient is end of life) Need for family participation in patient care:  Yes (Comment) (Patient has advanced dementia)  Care giving concerns: Patient's family feels like patient does not have any quality of life anymore due to her numerous medical issues.   Social Worker assessment / plan:  CSW spoke with patient's daughter who is the primary Media planner, due to patient's advanced dementia.  Patient's daughter states that patient's health has deteriorated and she would not want any extreme measures taken.  Patient has been transitioned to comfort care, and is a DNR.  Patient was at [facility] and is now going to go to [facility] per recommendation by physician.  Patient's family are in agreement that residential hospice is what they would like her to go to.   Employment status:  Retired Nurse, adult PT Recommendations:  [place] / Referral to community resources:     Patient/Family's Response to care:  Patient's family is in agreement to patient going to [facility].  Patient/Family's  Understanding of and Emotional Response to Diagnosis, Current Treatment, and Prognosis:  Patient's family are aware of patient's poor prognosis and have agreed to go to [facility] for residential hospice.  Emotional Assessment Appearance:  Appears stated age Attitude/Demeanor/Rapport:  Unable to Assess Affect (typically observed):  Unable to Assess Orientation:  Fluctuating Orientation (Suspected and/or reported Sundowners) Alcohol / Substance use:  Not Applicable Psych involvement (Current and /or in the community):  No (Comment)  Discharge Needs  Concerns to be addressed:  No discharge needs identified Readmission within the last 30 days:  No Current discharge risk:  None Barriers to Discharge:  No Barriers Identified   Ross Ludwig, LCSWA 05/25/2015, 2:14 PM

## 2015-05-25 NOTE — Discharge Summary (Signed)
Physician Discharge Summary  [name] PJA:[id number] DOB: 25-Oct-1935 DOA: 05/21/2015  PCP: Cari Caraway, MD  Admit date: 05/21/2015 Discharge date: 05/25/2015  Time spent: > 35 minutes  Recommendations for Outpatient Follow-up:  1. Continue to ensure comfort care measures  Discharge Diagnoses:  Principal Problem:   Hypertensive urgency Active Problems:   Depression   History of CVA (cerebrovascular accident)   ESOPHAGEAL STRICTURE   GERD (gastroesophageal reflux disease)   Leukocytosis   Chronic diastolic CHF (congestive heart failure)   Seizure   Paroxysmal atrial fibrillation   Chronic arterial ischemic stroke   Advanced dementia   HLD (hyperlipidemia)   CKD (chronic kidney disease), stage III   Pressure ulcer   Discharge Condition: stable  Diet recommendation: dysphagia 1 diet  Filed Weights   05/22/15 2310 05/23/15 2018  Weight: 65.4 kg (144 lb 2.9 oz) 66.134 kg (145 lb 12.8 oz)    History of present illness:  79 y.o. female with hx of esophageal stricture, stroke with right-sided weakness, TIA, atrial fibrillation on Coumadin, seizure d/o, diastolic congestive heart failure, severe dementia, and CKD-III who presented with aspiration and an elevated BP. Pt was reportedly observed to aspirate while taking oral medications and had been somnolent since. After discussions with family all active treatment was withdrawn.   Hospital Course:  Advanced dementia with dysphagia Per my associate's notes case discussed with family and comfort care measures and hospice were elected by family.  Hypertensive urgency BP remains poorly controlled. Not being managed actively currently.  Aspiration Pt with known aphasia and esophageal stricture. Comfort feeds only.  Chronic A-fib with acute RVR, CHA2DS2 - VASc 7 Patient was on a Cardizem infusion. Currently heart rate is reasonably well controlled without any medications. Continue to monitor. Was on anticoagulation, which  has been discontinued.  Gram + cocci in clusters - 1 of 2 blood cx Coagulase-negative staph. Aureus identified on the blood cultures. This is most likely a contaminant.  Hypernatremia/Hypokalemia No need for blood work.  History of CVA  Aphasia at baseline. Comfort feeds.  Chronic diastolic CHF No signs of volume overload on exam, no signs of pulmonary vascular congestion on CXR. No aggressive measures.  Seizure d/o Family, very concerned about patient having another seizure. We will place the patient on Depakote sprinkles with the understanding that this may not prevent seizure activity.  Depression and anxiety  CKD-III Baseline creatinine 1.0-1.2, her creatinine is at baseline    Procedures:  None  Consultations:  hospice  Discharge Exam: Filed Vitals:   05/25/15 0500  BP: 165/98  Pulse: 89  Temp: 98.9 F (37.2 C)  Resp: 16    General: pt in nad, sleeping but arousable Cardiovascular: s1 and s2, no rubs Respiratory: no increased wob, no wheezes  Discharge Instructions   Discharge Instructions    Call MD for:  redness, tenderness, or signs of infection (pain, swelling, redness, odor or green/yellow discharge around incision site)    Complete by:  As directed      Call MD for:  severe uncontrolled pain    Complete by:  As directed      Call MD for:  temperature >100.4    Complete by:  As directed      Diet - low sodium heart healthy    Complete by:  As directed      Discharge instructions    Complete by:  As directed   Continue to ensure comfort care measures.     Increase activity slowly  Complete by:  As directed           Current Discharge Medication List    START taking these medications   Details  divalproex (DEPAKOTE SPRINKLE) 125 MG capsule Take 2 capsules (250 mg total) by mouth every 8 (eight) hours. Qty: 60 capsule, Refills: 0      STOP taking these medications     acetaminophen (TYLENOL) 500 MG tablet      atorvastatin  (LIPITOR) 40 MG tablet      carbamazepine (TEGRETOL) 100 MG chewable tablet      Cholecalciferol 2000 UNITS TABS      citalopram (CELEXA) 20 MG tablet      cloNIDine (CATAPRES) 0.2 MG tablet      conjugated estrogens (PREMARIN) vaginal cream      diltiazem (CARDIZEM CD) 120 MG 24 hr capsule      food thickener (THICK IT) POWD      hydrocortisone (PROCTO-PAK) 1 % CREA      lisinopril (PRINIVIL,ZESTRIL) 40 MG tablet      loperamide (IMODIUM A-D) 2 MG tablet      LORazepam (ATIVAN) 0.5 MG tablet      metoprolol (LOPRESSOR) 100 MG tablet      mirtazapine (REMERON) 15 MG tablet      ondansetron (ZOFRAN) 8 MG tablet      oxyCODONE-acetaminophen (PERCOCET) 5-325 MG per tablet      Protein (PROCEL PO)      saccharomyces boulardii (FLORASTOR) 250 MG capsule      valproic acid (DEPAKENE) 250 MG capsule        Allergies  Allergen Reactions  . Ciprofloxacin Other (See Comments)    diarrhea/yeast infection  . Codeine Other (See Comments)    unknown  . Dilantin [Phenytoin Sodium Extended]   . Levetiracetam Nausea Only    Nausea developed while on this med, worsened when dose increased.  Nausea ceased when med was d/cd.  Marland Kitchen Penicillins Hives  . Phenytoin Sodium Extended Other (See Comments)    unknown      The results of significant diagnostics from this hospitalization (including imaging, microbiology, ancillary and laboratory) are listed below for reference.    Significant Diagnostic Studies: Dg Chest 2 View  05/21/2015   CLINICAL DATA:  Productive cough. Aphasic. Hypertension and low-grade fever.  EXAM: CHEST  2 VIEW  COMPARISON:  04/23/2015 and 01/26/2015  FINDINGS: Lungs are adequately inflated with no focal airspace consolidation or effusion. Cardiomediastinal silhouette and remainder of the exam is unchanged including an old displaced left humeral neck fracture.  IMPRESSION: No active cardiopulmonary disease.   Electronically Signed   By: Marin Olp M.D.   On:  05/21/2015 16:42   Ct Head Wo Contrast  05/21/2015   CLINICAL DATA:  Altered mental status. Aphasia. History of atrial fibrillation. Stroke. Benign brain tumor.  EXAM: CT HEAD WITHOUT CONTRAST  TECHNIQUE: Contiguous axial images were obtained from the base of the skull through the vertex without intravenous contrast.  COMPARISON:  MRI of 04/22/2017 and CT of 04/22/2017.  FINDINGS: Sinuses/Soft tissues: Clear paranasal sinuses and mastoid air cells. Left frontal craniotomy.  Intracranial: Advanced low density in the periventricular white matter likely related to small vessel disease. Redemonstration of left frontal encephalomalacia. Advanced cerebral atrophy with similar resultant ventriculomegaly. Cerebellar atrophy is also advanced. Remote right basal ganglia lacunar infarct again identified. No convincing evidence of acute hemorrhage, infarct, mass lesion, intra-axial, or extra-axial fluid collection.  IMPRESSION: 1. Redemonstration of encephalomalacia involving the left frontal lobe.  2. Severe cerebral atrophy and small vessel ischemic change with remote right basal ganglia lacunar infarct. 3. No convincing evidence of acute superimposed process.   Electronically Signed   By: Abigail Miyamoto M.D.   On: 05/21/2015 16:39   Dg Swallowing Func-speech Pathology  05/22/2015    Objective Swallowing Evaluation:    Patient Details  Name: MADELYN TLATELPA MRN: [id number] Date of Birth: 1936/03/17  Today's Date: 05/22/2015 Time: SLP Start Time (ACUTE ONLY): 0751-SLP Stop Time (ACUTE ONLY): 0816 SLP Time Calculation (min) (ACUTE ONLY): 25 min  Past Medical History:  Past Medical History  Diagnosis Date  . Depressive disorder, not elsewhere classified   . Atrial fibrillation   . Unspecified transient cerebral ischemia   . Unspecified cerebral artery occlusion with cerebral infarction   . Unspecified essential hypertension   . Diverticulosis of colon (without mention of hemorrhage)   . Stricture and stenosis of esophagus   .  Seizure disorder   . GERD (gastroesophageal reflux disease)   . IBS (irritable bowel syndrome)   . Stroke   . CHF (congestive heart failure)   . Anemia   . Hyperlipidemia   . Arrhythmia   . Hx: UTI (urinary tract infection)   . Seizures    Past Surgical History:  Past Surgical History  Procedure Laterality Date  . Hemorrhoid surgery    . Benign braintumor      Removal  . Flexible sigmoidoscopy  09/02/2011    Procedure: FLEXIBLE SIGMOIDOSCOPY;  Surgeon: Zenovia Jarred, MD;  Location:  [facility] ENDOSCOPY;  Service: Gastroenterology;  Laterality: N/A;  . Hip arthroplasty  11/26/2011    Procedure: ARTHROPLASTY BIPOLAR HIP;  Surgeon: Alta Corning, MD;   Location: [facility] ORS;  Service: Orthopedics;  Laterality: Right;   HPI:  Other Pertinent Information: HPI: JERAH ESTY is a 79 y.o. female with  PMH of esophageal stricture, stroke with right-sided weakness, aphasia,  TIA, atrial fibrillation on Coumadin, seizure, diastolic congestive heart  failure, severe dementia, CKD-III, who presents with the aspiration and  elevated bp. Pt with history of dypshagia, per RN notes son reports recent  swallow study (not done at [facility]) with baseline diet prior to hospitalization  honey thick liquids and puree. Chest x ray and CT of head negative for  acute abnormalities.   No Data Recorded  Assessment / Plan / Recommendation CHL IP CLINICAL IMPRESSIONS 05/22/2015  Therapy Diagnosis Moderate to severe oropharyngeal dysphagia  Clinical Impression Pt presents with moderate to severe oropharyngeal  dysphagia which is exacerbated by progressed dementia and associated  severe cognitive decline. Pt unable to follow commands this date to  increase swallow safety. First bolus swallow intiation not triggered until  56 seconds had passed. Delay in swallow intiation evidenced with all PO.  Oral residuals present, negatively impacting swallow safety. Residuals  later pooled to valleculae and pyriform sinsuses and spilled into  laryngeal vestibule below true vocal  cords to become aspirated. Pt with  inconsistent sensation of aspirates with ineffective volitional cough that  did not clear airway. Anticipate if mentation improves, pt will be able to  tolerate dysphagia 1 and honey thick liquid diet. Recommend continue NPO  at this time with medicine via alternative means. ST to follow up tomorrow  for bedside PO trials.       CHL IP TREATMENT RECOMMENDATION 05/22/2015  Treatment Recommendations PO trials with SLP only     CHL IP DIET RECOMMENDATION 05/22/2015  SLP Diet Recommendations NPO  Liquid Administration via (None)  Medication Administration Via alternative means  Compensations (None)  Postural Changes and/or Swallow Maneuvers (None)     CHL IP OTHER RECOMMENDATIONS 05/22/2015  Recommended Consults (None)  Oral Care Recommendations Oral care QID  Other Recommendations (None)     CHL IP FOLLOW UP RECOMMENDATIONS 02/21/2015  Follow up Recommendations Skilled Nursing facility     [facility] IP FREQUENCY AND DURATION 05/22/2015  Speech Therapy Frequency (ACUTE ONLY) min 2x/week  Treatment Duration 2 weeks     Pertinent Vitals/Pain      No flowsheet data found.    CHL IP REASON FOR REFERRAL 05/22/2015  Reason for Referral Objectively evaluate swallowing function              Arvil Chaco MA, CCC-SLP Acute Care Speech Language Pathologist    Levi Aland 05/22/2015, 9:46 AM     Microbiology: Recent Results (from the past 240 hour(s))  Culture, blood (x 2)     Status: None   Collection Time: 05/21/15  9:20 PM  Result Value Ref Range Status   Specimen Description BLOOD LEFT HAND  Final   Special Requests BOTTLES DRAWN AEROBIC AND ANAEROBIC 3CC   Final   Culture  Setup Time   Final    GRAM POSITIVE COCCI IN CLUSTERS IN BOTH AEROBIC AND ANAEROBIC BOTTLES CRITICAL RESULT CALLED TO, READ BACK BY AND VERIFIED WITH: T IRBY,RN [id number] [place]    Culture   Final    STAPHYLOCOCCUS SPECIES (COAGULASE NEGATIVE) THE SIGNIFICANCE OF ISOLATING THIS ORGANISM FROM A SINGLE SET OF  BLOOD CULTURES WHEN MULTIPLE SETS ARE DRAWN IS UNCERTAIN. PLEASE NOTIFY THE MICROBIOLOGY DEPARTMENT WITHIN ONE WEEK IF SPECIATION AND SENSITIVITIES ARE REQUIRED.    Report Status 05/24/2015 FINAL  Final  Culture, blood (x 2)     Status: None (Preliminary result)   Collection Time: 05/21/15 10:18 PM  Result Value Ref Range Status   Specimen Description BLOOD LEFT HAND  Final   Special Requests BOTTLES DRAWN AEROBIC ONLY 5CC  Final   Culture NO GROWTH 4 DAYS  Final   Report Status PENDING  Incomplete  MRSA PCR Screening     Status: None   Collection Time: 05/22/15  1:19 AM  Result Value Ref Range Status   MRSA by PCR NEGATIVE NEGATIVE Final    Comment:        The GeneXpert MRSA Assay (FDA approved for NASAL specimens only), is one component of a comprehensive MRSA colonization surveillance program. It is not intended to diagnose MRSA infection nor to guide or monitor treatment for MRSA infections.      Labs: Basic Metabolic Panel:  Recent Labs Lab 05/21/15 1557 05/22/15 0730 05/23/15 0228  NA 148* 144 147*  K 3.3* 4.8 2.8*  CL 107 108 112*  CO2 30 24 23   GLUCOSE 111* 141* 123*  BUN 25* 22* 18  CREATININE 1.16* 0.89 0.92  CALCIUM 9.4 8.4* 8.2*  MG  --  1.8  --    Liver Function Tests:  Recent Labs Lab 05/22/15 0730  AST 54*  ALT 8*  ALKPHOS 85  BILITOT 1.8*  PROT 6.2*  ALBUMIN 2.7*   No results for input(s): LIPASE, AMYLASE in the last 168 hours. No results for input(s): AMMONIA in the last 168 hours. CBC:  Recent Labs Lab 05/21/15 1557 05/22/15 0730 05/23/15 0228  WBC 14.9* 16.2* 11.6*  NEUTROABS 13.0*  --   --   HGB 15.4* 14.4 13.8  HCT 46.8* 43.8 42.3  MCV 101.3* 101.6* 101.0*  PLT 281 250 258   Cardiac Enzymes:  Recent Labs Lab 05/21/15 2115 05/22/15 0135 05/22/15 0817  TROPONINI 0.05* 0.04* <0.03   BNP: BNP (last 3 results)  Recent Labs  05/22/15 0730  BNP 509.7*    ProBNP (last 3 results) No results for input(s): PROBNP in  the last 8760 hours.  CBG:  Recent Labs Lab 05/22/15 0745 05/23/15 0745  GLUCAP 127* 110*       Signed:  Velvet Bathe  [facility] 05/25/2015, 2:15 PM

## 2015-05-25 NOTE — Progress Notes (Signed)
Pt discharged to [facility]. Pt. Is alert. IV removed by previous RN. Pt on stretch. Discharge plan appropriate and in place.

## 2015-05-25 NOTE — Progress Notes (Addendum)
Patient being transferred to [facility] place via transport at 1530.  Reported called to [facility], Therapist, sports.  Patient's IV's were discontinued with no complications. Foley left in.  Patient resting comfortably with no signs of distress.

## 2015-05-25 NOTE — Clinical Social Work Note (Addendum)
Patient to be d/c'ed today to [facility].  Patient and family agreeable to plans will transport via ems RN to call report.  Patient's daughter [name] was informed of patient's scheduled transportation by [facility].  Evette Cristal, MSW, [place]

## 2015-05-26 LAB — CULTURE, BLOOD (ROUTINE X 2): CULTURE: NO GROWTH

## 2015-06-25 DEATH — deceased
